# Patient Record
Sex: Female | Born: 1965 | Race: White | Hispanic: No | Smoking: Never smoker
Health system: Southern US, Community
[De-identification: ages and names within clinical notes are randomized; demographics above are authoritative.]

## PROBLEM LIST (undated history)

## (undated) DIAGNOSIS — C50919 Malignant neoplasm of unspecified site of unspecified female breast: Secondary | ICD-10-CM

## (undated) DIAGNOSIS — Z973 Presence of spectacles and contact lenses: Secondary | ICD-10-CM

## (undated) DIAGNOSIS — T4145XA Adverse effect of unspecified anesthetic, initial encounter: Secondary | ICD-10-CM

## (undated) DIAGNOSIS — F32A Depression, unspecified: Secondary | ICD-10-CM

## (undated) DIAGNOSIS — T7840XA Allergy, unspecified, initial encounter: Secondary | ICD-10-CM

## (undated) DIAGNOSIS — D649 Anemia, unspecified: Secondary | ICD-10-CM

## (undated) DIAGNOSIS — T8859XA Other complications of anesthesia, initial encounter: Secondary | ICD-10-CM

## (undated) DIAGNOSIS — K219 Gastro-esophageal reflux disease without esophagitis: Secondary | ICD-10-CM

## (undated) DIAGNOSIS — F329 Major depressive disorder, single episode, unspecified: Secondary | ICD-10-CM

## (undated) DIAGNOSIS — F419 Anxiety disorder, unspecified: Secondary | ICD-10-CM

## (undated) HISTORY — DX: Gastro-esophageal reflux disease without esophagitis: K21.9

## (undated) HISTORY — PX: CHOLECYSTECTOMY: SHX55

## (undated) HISTORY — DX: Depression, unspecified: F32.A

## (undated) HISTORY — DX: Malignant neoplasm of unspecified site of unspecified female breast: C50.919

## (undated) HISTORY — PX: APPENDECTOMY: SHX54

## (undated) HISTORY — DX: Anemia, unspecified: D64.9

---
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
## 1991-02-28 HISTORY — PX: DILATION AND CURETTAGE OF UTERUS: SHX78

## 2012-08-06 DIAGNOSIS — C50919 Malignant neoplasm of unspecified site of unspecified female breast: Secondary | ICD-10-CM

## 2012-08-06 HISTORY — DX: Malignant neoplasm of unspecified site of unspecified female breast: C50.919

## 2012-08-06 HISTORY — PX: BREAST BIOPSY: SHX20

## 2012-08-20 ENCOUNTER — Telehealth: Payer: Self-pay | Admitting: *Deleted

## 2012-08-20 NOTE — Telephone Encounter (Signed)
Confirmed 08/22/12 appt w/ pt.  Unable to mail before appt letter - gave verbal.  Unable to mail packet - put request for registration to give at time of arrival in appt notes.  Emailed pt address.  Angela Burke at referring to make aware.  Called Mandy at [facility] to verify that she had received slides and she did.  Going to send me report.  Took paperwork to [facility] for chart.

## 2012-08-21 ENCOUNTER — Other Ambulatory Visit: Payer: Self-pay | Admitting: Medical Oncology

## 2012-08-21 ENCOUNTER — Other Ambulatory Visit: Payer: Self-pay | Admitting: *Deleted

## 2012-08-21 DIAGNOSIS — C50912 Malignant neoplasm of unspecified site of left female breast: Secondary | ICD-10-CM

## 2012-08-21 DIAGNOSIS — C50911 Malignant neoplasm of unspecified site of right female breast: Secondary | ICD-10-CM

## 2012-08-21 DIAGNOSIS — C50919 Malignant neoplasm of unspecified site of unspecified female breast: Secondary | ICD-10-CM | POA: Insufficient documentation

## 2012-08-22 ENCOUNTER — Encounter: Payer: Self-pay | Admitting: Oncology

## 2012-08-22 ENCOUNTER — Telehealth: Payer: Self-pay | Admitting: Oncology

## 2012-08-22 ENCOUNTER — Ambulatory Visit: Payer: BC Managed Care – PPO

## 2012-08-22 ENCOUNTER — Other Ambulatory Visit (HOSPITAL_BASED_OUTPATIENT_CLINIC_OR_DEPARTMENT_OTHER): Payer: BC Managed Care – PPO | Admitting: Lab

## 2012-08-22 ENCOUNTER — Other Ambulatory Visit: Payer: Self-pay | Admitting: Medical Oncology

## 2012-08-22 ENCOUNTER — Ambulatory Visit (HOSPITAL_BASED_OUTPATIENT_CLINIC_OR_DEPARTMENT_OTHER): Payer: BC Managed Care – PPO | Admitting: Oncology

## 2012-08-22 VITALS — BP 128/81 | HR 73 | Temp 98.1°F | Resp 20 | Wt 183.7 lb

## 2012-08-22 DIAGNOSIS — C50919 Malignant neoplasm of unspecified site of unspecified female breast: Secondary | ICD-10-CM

## 2012-08-22 DIAGNOSIS — C50911 Malignant neoplasm of unspecified site of right female breast: Secondary | ICD-10-CM

## 2012-08-22 DIAGNOSIS — K219 Gastro-esophageal reflux disease without esophagitis: Secondary | ICD-10-CM

## 2012-08-22 DIAGNOSIS — C50912 Malignant neoplasm of unspecified site of left female breast: Secondary | ICD-10-CM

## 2012-08-22 HISTORY — DX: Gastro-esophageal reflux disease without esophagitis: K21.9

## 2012-08-22 LAB — CBC WITH DIFFERENTIAL/PLATELET
Basophils Absolute: 0 10*3/uL (ref 0.0–0.1)
HCT: 41.4 % (ref 34.8–46.6)
HGB: 13.8 g/dL (ref 11.6–15.9)
MCH: 29.7 pg (ref 25.1–34.0)
MONO#: 0.4 10*3/uL (ref 0.1–0.9)
NEUT%: 52.2 % (ref 38.4–76.8)
WBC: 5.5 10*3/uL (ref 3.9–10.3)
lymph#: 2 10*3/uL (ref 0.9–3.3)

## 2012-08-22 LAB — COMPREHENSIVE METABOLIC PANEL (CC13)
BUN: 12.5 mg/dL (ref 7.0–26.0)
CO2: 28 mEq/L (ref 22–29)
Calcium: 9.7 mg/dL (ref 8.4–10.4)
Chloride: 105 mEq/L (ref 98–109)
Creatinine: 0.8 mg/dL (ref 0.6–1.1)
Glucose: 88 mg/dl (ref 70–140)

## 2012-08-22 NOTE — Progress Notes (Signed)
Checked in new patient. No financial issues. The patient is really upset. No POA/living will. She wants communication via mail and phone only, she may give email later(really upset). I gave her the [facility] for to be filled out.

## 2012-08-22 NOTE — Patient Instructions (Addendum)
We discussed your diagnosis  We discussed treatment options  We referred you to surgery and radiation oncology  We ordered MRI breasts/CT and PET scan/echo  I will see you back in 2 weeks for follow up

## 2012-08-22 NOTE — Progress Notes (Signed)
Elizabeth Miles [id number] August 11, 1965 47 y.o. 08/22/2012 1:49 PM  CC  Dr. Evlyn Kanner Dr. Reuel Boom Pomposini Dr. Emelia Loron Dr. Lurline Hare  REASON FOR CONSULTATION:  47 year old female with new diagnosis of stage II right breast cancer   STAGE:  Right breast Stage II,T2NxMx ER+PR-Her2Neu- Ki-67 high    REFERRING PHYSICIAN: Dr. Norva Pavlov  HISTORY OF PRESENT ILLNESS:  Elizabeth Miles is a 47 y.o. female.  Without significant past medical history who on June 2013 had a mammogram that was normal. But there was on physical exam possibility of a cyst noted in the right breast. The mammogram was negative. In 2014 June patient noted on exam another lump in the right breast. She underwent a diagnostic mammogram on June 10 that showed a right breast nodule in the outer quadrant. She had an ultrasound performed that showed at the 9:30 o'clock position a 3.6 cm area and then added 10:00 position 1.3 cm area with a total area being anywhere between 5-6 cm. The patient went on to have a right breast biopsy performed in [place]. The pathology revealed an invasive ductal carcinoma. This is been confirmed by our pathology as well. The carcinoma and papillary features and was felt to be between a grade 1 and 2. The tumor was estrogen receptor positive strongly (100%) progesterone receptor negative HER-2/neu negative with a Ki-67 that showed a high proliferation rate. Patient is now seen in medical oncology for discussion of treatment options. She has not had a MRI performed or a consultation with surgery or radiation oncology. Patient is very anxious nervous she today is accompanied by her husband as well as her mother and father and her son. She does have tenderness in the right breast. She denies any nausea vomiting fevers chills night sweats headaches shortness of breath chest pains palpitations. She has no myalgias and arthralgias. Remainder of the 14 point review of systems is  negative.   Past Medical History: Past Medical History  Diagnosis Date  . 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 GERD (gastroesophageal reflux disease)   . GERD (gastroesophageal reflux disease) 08/22/2012    Past Surgical History: Past Surgical History  Procedure Laterality Date  . Dilation and curettage of uterus  1993  . Cervical ablation  1983    Family History: Family History  Problem Relation Age of Onset  . Hypertension Mother   . Cancer Maternal Uncle 73    bladder cancer  . Cancer Paternal Grandmother 10    lung cancer  . Cancer Paternal Grandfather 60    lower back cancer    Social History History  Substance Use Topics  . Smoking status: Never Smoker   . Smokeless tobacco: Never Used  . Alcohol Use: No    Allergies: Allergies  Allergen Reactions  . Darvocet (Propoxyphene-Acetaminophen) Shortness Of Breath and Swelling  . Shellfish Allergy Shortness Of Breath and Swelling    Current Medications: Current Outpatient Prescriptions  Medication Sig Dispense Refill  . acetaminophen (TYLENOL) 325 MG tablet Take 650 mg by mouth every 4 (four) hours as needed for pain.      . cetirizine (ZYRTEC) 10 MG tablet Take 10 mg by mouth daily.      Marland Kitchen ibuprofen (ADVIL,MOTRIN) 200 MG tablet Take 200 mg by mouth every 4 (four) hours as needed for pain.      . Lansoprazole (PREVACID PO) Take by mouth daily.       No current facility-administered medications for this visit.  OB/GYN History: menarche at 1, pre-menopause, G4P3 first live birth at 64  Fertility Discussion: N/A Prior History of Cancer: no   Health Maintenance:  Colonoscopy none Bone Density none Last PAP smear 2014  ECOG PERFORMANCE STATUS: 0 - Asymptomatic  Genetic Counseling/testing: patient will be referred to genetic counseling  REVIEW OF SYSTEMS:  A comprehensive review of systems was negative.  PHYSICAL EXAMINATION: Blood pressure 128/81, pulse 73, temperature 98.1 F  (36.7 C), temperature source Oral, resp. rate 20, weight 183 lb 11.2 oz (83.326 kg). Patient is a well-developed well-nourished female she is anxious but otherwise in no acute distress. HEENT exam EOMI PERRLA sclerae anicteric no conjunctival pallor oral mucosa is moist neck is supple lungs clear bilaterally to auscultation cardiovascular regular rate rhythm abdomen is soft nontender nondistended bowel sounds are present no HSM extremities no clubbing edema or cyanosis neuro patient's alert oriented otherwise nonfocal. Left breast no masses nipple discharge no skin changes Right breast does reveal a palpable mass in the 10:00 to 9:00 position measuring about 4 cm.     STUDIES/RESULTS: No results found.   LABS:    Chemistry      Component Value Date/Time   NA 140 08/22/2012 1257   K 4.3 08/22/2012 1257   CO2 28 08/22/2012 1257   BUN 12.5 08/22/2012 1257   CREATININE 0.8 08/22/2012 1257      Component Value Date/Time   CALCIUM 9.7 08/22/2012 1257   ALKPHOS 51 08/22/2012 1257   AST 11 08/22/2012 1257   ALT 10 08/22/2012 1257   BILITOT 0.45 08/22/2012 1257      Lab Results  Component Value Date   WBC 5.5 08/22/2012   HGB 13.8 08/22/2012   HCT 41.4 08/22/2012   MCV 89.2 08/22/2012   PLT 280 08/22/2012   PATHOLOGY:  ASSESSMENT    47 year old female with  #1 new diagnosis of invasive ductal carcinoma of the right breast made on a recent biopsy. The tumor has papillary features it is felt to be between a grade 1 and 2. The tumor is estrogen receptor strongly positive nearly 100% and progesterone receptor negative. HER-2/neu-negative with elevated Ki-67.  #2 patient and I discussed her history her radiology as well as the pathology in detail. She has not had MRI of the breasts performed and I do think that this is important as she does require breast conservation. We also discussed staging studies which would include PET scan and CT scans.  #3 we also discussed you to her early onset of  breast cancer that she would be a candidate for genetic testing. We discussed the rationale for that we discussed BRCA1 and BRCA2 gene mutations. I will refer her to our genetic counselor.  #4 we discussed her treatment options. Since patient does desire breast conservation I do think she would be a good candidate for neoadjuvant/preop chemotherapy. We discussed the type of chemotherapy which would include Adriamycin Cytoxan to be given every 2 weeks for a total of 4 cycles with Neulasta support. After this she will proceed with Taxol every week for a total of 12 weeks. After completion of chemotherapy we would repeat an MRI of her breasts. To evaluate response to therapy. After that she would proceed with surgery.  #5 patient does need to be seen by a surgical oncologist and I have recommended that she be seen by Dr. Emelia Loron and I will set her up for an appointment.  #6 the patient successfully undergoes breast conservation then she certainly will  need radiation therapy and I will refer her to Dr. Lurline Hare for that.  Clinical Trial Eligibility: no Multidisciplinary conference discussion no     PLAN:    #1 patient will be referred for MRI of the breasts.  #2 Port-A-Cath placement by Dr. Emelia Loron and initial surgical consultation  #3 patient will need echocardiogram and chemotherapy class.  #4 for staging purposes I would get her set up for a PET/CT.  #5 patient's early onset breast cancer I would refer her to genetic counseling   #6 plan to get her started on chemotherapy in the next few weeks time.     Discussion: Patient is being treated per NCCN breast cancer care guidelines appropriate for stage.II   Thank you so much for allowing me to participate in the care of [employer]. I will continue to follow up the patient with you and assist in her care.  All questions were answered. The patient knows to call the clinic with any problems, questions or  concerns. We can certainly see the patient much sooner if necessary.  I spent 55 minutes counseling the patient face to face. The total time spent in the appointment was 60 minutes.  Drue Second, MD Medical/Oncology [facility] [phone] (beeper) [phone] (Office)  08/22/2012, 1:49 PM

## 2012-08-23 ENCOUNTER — Telehealth: Payer: Self-pay | Admitting: Oncology

## 2012-08-23 NOTE — Telephone Encounter (Signed)
, °

## 2012-08-26 ENCOUNTER — Other Ambulatory Visit: Payer: BC Managed Care – PPO

## 2012-08-27 ENCOUNTER — Other Ambulatory Visit: Payer: BC Managed Care – PPO

## 2012-08-27 ENCOUNTER — Ambulatory Visit
Admission: RE | Admit: 2012-08-27 | Discharge: 2012-08-27 | Disposition: A | Discharge status: Home / Self Care | Payer: BC Managed Care – PPO | Source: Ambulatory Visit | Attending: Oncology | Admitting: Oncology

## 2012-08-27 ENCOUNTER — Encounter: Payer: Self-pay | Admitting: *Deleted

## 2012-08-27 ENCOUNTER — Ambulatory Visit (HOSPITAL_COMMUNITY)
Admission: RE | Admit: 2012-08-27 | Discharge: 2012-08-27 | Disposition: A | Discharge status: Home / Self Care | Payer: BC Managed Care – PPO | Source: Ambulatory Visit | Attending: Oncology | Admitting: Oncology

## 2012-08-27 DIAGNOSIS — I519 Heart disease, unspecified: Secondary | ICD-10-CM

## 2012-08-27 DIAGNOSIS — C50919 Malignant neoplasm of unspecified site of unspecified female breast: Secondary | ICD-10-CM | POA: Insufficient documentation

## 2012-08-27 DIAGNOSIS — K219 Gastro-esophageal reflux disease without esophagitis: Secondary | ICD-10-CM

## 2012-08-27 DIAGNOSIS — I059 Rheumatic mitral valve disease, unspecified: Secondary | ICD-10-CM | POA: Insufficient documentation

## 2012-08-27 MED ORDER — GADOBENATE DIMEGLUMINE 529 MG/ML IV SOLN
16.0000 mL | Freq: Once | INTRAVENOUS | Status: AC | PRN
  Administered 2012-08-27: 16 mL via INTRAVENOUS

## 2012-08-27 NOTE — Progress Notes (Signed)
*  PRELIMINARY RESULTS* Echocardiogram 2D Echocardiogram has been performed.  Elizabeth Miles 08/27/2012, 2:49 PM

## 2012-08-28 ENCOUNTER — Encounter: Payer: Self-pay | Admitting: Radiation Oncology

## 2012-08-28 NOTE — Progress Notes (Addendum)
Location of Breast Cancer:right   Histology per Pathology Report: 08/06/12: Right Breast U/S biopsies; infiltrating ductal  Carcinoma  Receptor Status: ER(+), PR (-), Her2-neu (-)  Did patient present with symptoms (if so, please note symptoms) or was this found on screening mammography?: last year  Patient noticed a lump,mammogram showed nothing, this time lump still there, had U/S, mammogram and biopsy   Past/Anticipated interventions by surgeon, if ZOX:WRUE with Dr.Wakefield 09/10/12,evaluate right breast  Past/Anticipated interventions by medical oncology, if any: Chemotherapy  Seen in [facility] Dr.Khan, referred for genetic testing, appt 09/03/12 with Dr.Khan  Pet scan and Ct scan 08/29/12 scheduled , had MRI  At [facility] imaging  Tuesday 08/27/12,  Lymphedema issues, if any: no Pain issues, if any:  None in breast, lower back pain years chronic  SAFETY ISSUES:  Prior radiation? no  Pacemaker/ICD? no  Possible current pregnancy?no  Is the patient on methotrexate? no  Current Complaints / other details: Married, Menarche age 31,pre-menopauase,G4P3, 1 miscarriage, 1st live birth age 71, Cervical ablation, 1983, ,Dilation and curettage of uterus 6  Mother htn,living,  father  Healthy, Maternal Uncle 73,bladder cancer,deceased, Paternal Grandfather  63,lung cancer, deceased, Paternal Grandmother  65,lower back cancer deceased   Never smoker,   Allergies: Darvocet/shellfish=SOB and swelling  Lowella Petties, RN 08/28/2012,3:00 PM

## 2012-08-29 ENCOUNTER — Encounter (HOSPITAL_COMMUNITY)
Admission: RE | Admit: 2012-08-29 | Discharge: 2012-08-29 | Disposition: A | Discharge status: Home / Self Care | Payer: BC Managed Care – PPO | Source: Ambulatory Visit | Attending: Oncology | Admitting: Oncology

## 2012-08-29 ENCOUNTER — Ambulatory Visit
Admission: RE | Admit: 2012-08-29 | Discharge: 2012-08-29 | Disposition: A | Discharge status: Home / Self Care | Payer: BC Managed Care – PPO | Source: Ambulatory Visit | Attending: Radiation Oncology | Admitting: Radiation Oncology

## 2012-08-29 ENCOUNTER — Encounter (HOSPITAL_COMMUNITY): Payer: Self-pay

## 2012-08-29 ENCOUNTER — Ambulatory Visit (HOSPITAL_COMMUNITY)
Admission: RE | Admit: 2012-08-29 | Discharge: 2012-08-29 | Disposition: A | Discharge status: Home / Self Care | Payer: BC Managed Care – PPO | Source: Ambulatory Visit | Attending: Oncology | Admitting: Oncology

## 2012-08-29 ENCOUNTER — Other Ambulatory Visit: Payer: Self-pay | Admitting: Emergency Medicine

## 2012-08-29 ENCOUNTER — Encounter: Payer: Self-pay | Admitting: Radiation Oncology

## 2012-08-29 VITALS — BP 109/73 | HR 64 | Temp 98.1°F | Resp 20 | Ht 67.0 in | Wt 185.4 lb

## 2012-08-29 DIAGNOSIS — Z8052 Family history of malignant neoplasm of bladder: Secondary | ICD-10-CM | POA: Insufficient documentation

## 2012-08-29 DIAGNOSIS — Z79899 Other long term (current) drug therapy: Secondary | ICD-10-CM | POA: Insufficient documentation

## 2012-08-29 DIAGNOSIS — Z801 Family history of malignant neoplasm of trachea, bronchus and lung: Secondary | ICD-10-CM | POA: Insufficient documentation

## 2012-08-29 DIAGNOSIS — K219 Gastro-esophageal reflux disease without esophagitis: Secondary | ICD-10-CM

## 2012-08-29 DIAGNOSIS — C50911 Malignant neoplasm of unspecified site of right female breast: Secondary | ICD-10-CM

## 2012-08-29 DIAGNOSIS — C50919 Malignant neoplasm of unspecified site of unspecified female breast: Secondary | ICD-10-CM | POA: Insufficient documentation

## 2012-08-29 DIAGNOSIS — Z170 Estrogen receptor positive status [ER+]: Secondary | ICD-10-CM | POA: Insufficient documentation

## 2012-08-29 DIAGNOSIS — N649 Disorder of breast, unspecified: Secondary | ICD-10-CM | POA: Insufficient documentation

## 2012-08-29 HISTORY — DX: Allergy, unspecified, initial encounter: T78.40XA

## 2012-08-29 LAB — GLUCOSE, CAPILLARY: Glucose-Capillary: 88 mg/dL (ref 70–99)

## 2012-08-29 MED ORDER — FLUDEOXYGLUCOSE F - 18 (FDG) INJECTION
17.6000 | Freq: Once | INTRAVENOUS | Status: AC | PRN
  Administered 2012-08-29: 17.6 via INTRAVENOUS

## 2012-08-29 MED ORDER — RADIAPLEXRX EX GEL
Freq: Once | CUTANEOUS | Status: AC
  Administered 2012-08-29: 11:00:00 via TOPICAL

## 2012-08-29 MED ORDER — IOHEXOL 300 MG/ML  SOLN
100.0000 mL | Freq: Once | INTRAMUSCULAR | Status: AC | PRN
  Administered 2012-08-29: 100 mL via INTRAVENOUS

## 2012-08-29 NOTE — Progress Notes (Addendum)
Radiation Oncology         [phone]) [phone] ________________________________  Initial outpatient Consultation - Date: 08/29/2012   Name: Elizabeth Miles MRN: [id number]   DOB: April 12, 1965  REFERRING PHYSICIAN: Victorino December, MD  DIAGNOSIS: T2N0 Right Breast Cancer  HISTORY OF PRESENT ILLNESS::Elizabeth Miles is a 47 y.o. female  who palpated a breast mass last year. She had a mammogram performed at that time. Which showed no evidence of the mass. Some asymmetry was noted in the left breast but followup views were negative. When she palpated the mass again this year and it did not resolve she was sent for mammograms which showed a large nodule in the upper outer quadrant of the right breast adjacent to the nipple. An ultrasound was performed which showed a 3.6 x 2.8 x 3.1 cm mass with an additional nodule measuring 1.1 x 1.1 x 1.3 cm. A biopsy was performed on 08/06/2012 which showed an ER positive PR negative HER-2 negative breast cancer with a increased proliferation rate although the Ki-67 was not quantified. She was referred here and met with Dr. Welton Flakes who recommended neoadjuvant chemotherapy. A PET scan has been ordered. An MRI of the bilateral breasts was performed on July 1 which showed a irregular lobulated mass with a satellite nodule or lobulation at its superior aspect measured together as 4.5 x 4 x 3.8 cm. Extension of enhancement to the nipple suggested nipple involvement. She is scheduled to meet with surgery in a few weeks. She is accompanied by her parents today. She is a friend of one of my patients Elizabeth Miles. She would like to receive her radiation at [facility] if at all possible. She has no family history of breast ovarian or colon cancer. Due to her young age she has been referred to genetics counseling. That appointment is in August. She is interested in breast conservation. She is G4 P3 with her first live child birth at the age of 23.  PREVIOUS RADIATION THERAPY: No  PAST MEDICAL  HISTORY:  has a past medical history of Breast cancer (08/06/12); GERD (gastroesophageal reflux disease); GERD (gastroesophageal reflux disease) (08/22/2012); and Allergy.    PAST SURGICAL HISTORY: Past Surgical History  Procedure Laterality Date  . Dilation and curettage of uterus  1993  . Cervical ablation  1983  . Breast biopsy Right 08/06/12    FAMILY HISTORY:  Family History  Problem Relation Age of Onset  . Hypertension Mother   . Cancer Maternal Uncle 73    bladder cancer  . Cancer Paternal Grandmother 6    lung cancer  . Cancer Paternal Grandfather 12    lower back cancer    SOCIAL HISTORY:  History  Substance Use Topics  . Smoking status: Never Smoker   . Smokeless tobacco: Never Used  . Alcohol Use: No    ALLERGIES: Darvocet and Shellfish allergy  MEDICATIONS:  Current Outpatient Prescriptions  Medication Sig Dispense Refill  . hyaluronate sodium (RADIAPLEXRX) GEL Apply 1 application topically once. Apply after rad txs and bedtime,prn      . Lansoprazole (PREVACID PO) Take by mouth daily.      Marland Kitchen acetaminophen (TYLENOL) 325 MG tablet Take 650 mg by mouth every 4 (four) hours as needed for pain.      . cetirizine (ZYRTEC) 10 MG tablet Take 10 mg by mouth daily.      Marland Kitchen ibuprofen (ADVIL,MOTRIN) 200 MG tablet Take 200 mg by mouth every 4 (four) hours as needed for pain.  No current facility-administered medications for this encounter.    REVIEW OF SYSTEMS:  A 15 point review of systems is documented in the electronic medical record. This was obtained by the nursing staff. However, I reviewed this with the patient to discuss relevant findings and make appropriate changes.  Pertinent items are noted in HPI.   PHYSICAL EXAM:  Filed Vitals:   08/29/12 0927  BP: 109/73  Pulse: 64  Temp: 98.1 F (36.7 C)  Resp: 20  .185 lb 6.4 oz (84.097 kg). She is a pleasant female in no distress sitting comfortably examining table. She has large pendulous breasts  bilaterally. She has a palpable mass extending from the nipple to the upper outer quadrant of the right breast. This is mobile however is palpable right below the skin. There is some bruising over the entire outer quadrant of the right breast.  There are no palpable abnormalities of the left breast. There are no palpable axillary supraclavicular or cervical lymph nodes. She is alert and oriented x3.  LABORATORY DATA:  Lab Results  Component Value Date   WBC 5.5 08/22/2012   HGB 13.8 08/22/2012   HCT 41.4 08/22/2012   MCV 89.2 08/22/2012   PLT 280 08/22/2012   Lab Results  Component Value Date   NA 140 08/22/2012   K 4.3 08/22/2012   CO2 28 08/22/2012   Lab Results  Component Value Date   ALT 10 08/22/2012   AST 11 08/22/2012   ALKPHOS 51 08/22/2012   BILITOT 0.45 08/22/2012   Mr Breast Bilateral W Wo Contrast  08/27/2012   *RADIOLOGY REPORT*  Clinical Data: Newly-diagnosed right breast invasive ductal carcinoma manifesting as a palpable mass.  BILATERAL BREAST MRI WITH AND WITHOUT CONTRAST  Technique: Multiplanar, multisequence MR images of both breasts were obtained prior to and following the intravenous administration of 16ml of Multihance.  Three dimensional images were evaluated at the independent DynaCad workstation.  Comparison:  [place] mammograms 2014 and 2012.  We are not provided with post biopsy mammograms to indicate whether a clip was placed.  Findings: Background parenchymal enhancement pattern is mild. Breast parenchymal pattern suggests heterogeneously dense parenchyma.  In the right breast upper outer quadrant is an irregular lobulated mass with central T2 hyperintensity suggesting necrosis or biopsy artifact, with a satellite nodule or lobulation within 2 mm at its superior aspect, measured together as 4.5 x 4.0 x 3.8 cm.  This demonstrates washin/washout type enhancement kinetics.  This corresponds to the reported biopsy-proven breast cancer.  Internal signal voids are noted which may  indicate gas, clip artifact, or less likely calcification.  There is extension of enhancement to the nipple, suggesting nipple involvement may be present.  No other area of abnormal enhancement is seen in either breast.  No lymphadenopathy or T2-weighted hyperintensity elsewhere in either breast.  IMPRESSION: Dominant right breast upper outer quadrant irregular mass corresponding to the biopsy-proven breast cancer, measuring 4.5 cm in total and including the satellite nodule or lobulation at its superior aspect, which is within 2 mm of the dominant mass.  It is not clear from the provided images whether a clip was placed at prior biopsy.  If the patient is to undergo neoadjuvant chemotherapy, placement of a clip is highly recommended, because the mass could become occult on imaging if there is a favorable treatment response, and therefore may not be accurately localized if lumpectomy is planned.  Extension of enhancement from the mass to the right nipple may suggest nipple involvement.  No  evidence for contralateral left-sided abnormal enhancement.  RECOMMENDATION: Treatment plan  THREE-DIMENSIONAL MR IMAGE RENDERING ON INDEPENDENT WORKSTATION:  Three-dimensional MR images were rendered by post-processing of the original MR data on an independent workstation.  The three- dimensional MR images were interpreted, and findings were reported in the accompanying complete MRI report for this study.  BI-RADS CATEGORY 6:  Known biopsy-proven malignancy - appropriate action should be taken.   Original Report Authenticated By: Christiana Pellant, M.D.      IMPRESSION: T2 N0 ER positive PR negative HER-2 negative invasive ductal carcinoma of the right breast  PLAN: I spoke with Mrs. Kobashigawa and her parents today regarding her diagnosis and options for treatment. We discussed the role of neoadjuvant chemotherapy in increasing breast conservation rate in patients who have a large tumor compared to the breast size. She has fairly  generous breast and hopefully with neoadjuvant chemotherapy will be able to undergo breast conservation. We discussed the equivalency between neoadjuvant and adjuvant chemotherapy. We discussed that her MRI did not show any enlarged lymph nodes and her mass did not exceed 5 cm at this point she had no indications for postmastectomy radiation if she chose a mastectomy. We discussed the equivalency in terms of survival between mastectomy and lumpectomy plus radiation. We discussed the role of radiation in decreasing local failures even in patients who have a pathologic complete response to neoadjuvant chemotherapy. We discussed the process of simulation the placement tattoos. We discussed possible side effects including fatigue and skin redness. She would like to receive her treatment closer to [place] in [place]. This can be arranged after her surgery and she can be referred to Dr. Thersa Salt there. I did give her a tube of Radiaplex gel and instructions on its use during radiation. I told her I would be happy to meet back with her after surgery to discuss radiation again or she could be referred directly to Dr. Thersa Salt. Her questions were answered. I gave her information regarding simulation. She was encouraged to contact me with any questions. I spent 60 minutes  face to face with the patient and more than 50% of that time was spent in counseling and/or coordination of care.   ------------------------------------------------  Lurline Hare, MD

## 2012-08-29 NOTE — Progress Notes (Signed)
Please see the Nurse Progress Note in the MD Initial Consult Encounter for this patient. 

## 2012-09-03 ENCOUNTER — Other Ambulatory Visit (INDEPENDENT_AMBULATORY_CARE_PROVIDER_SITE_OTHER): Payer: Self-pay | Admitting: General Surgery

## 2012-09-03 ENCOUNTER — Ambulatory Visit (HOSPITAL_BASED_OUTPATIENT_CLINIC_OR_DEPARTMENT_OTHER): Payer: BC Managed Care – PPO | Admitting: Oncology

## 2012-09-03 ENCOUNTER — Telehealth: Payer: Self-pay | Admitting: *Deleted

## 2012-09-03 ENCOUNTER — Telehealth (INDEPENDENT_AMBULATORY_CARE_PROVIDER_SITE_OTHER): Payer: Self-pay

## 2012-09-03 VITALS — BP 124/81 | HR 76 | Temp 98.1°F | Resp 20 | Ht 67.0 in | Wt 187.2 lb

## 2012-09-03 DIAGNOSIS — C50919 Malignant neoplasm of unspecified site of unspecified female breast: Secondary | ICD-10-CM | POA: Insufficient documentation

## 2012-09-03 DIAGNOSIS — C50911 Malignant neoplasm of unspecified site of right female breast: Secondary | ICD-10-CM

## 2012-09-03 MED ORDER — LORAZEPAM 0.5 MG PO TABS: 0.5000 mg | ORAL_TABLET | Freq: Four times a day (QID) | ORAL | Status: DC | PRN

## 2012-09-03 MED ORDER — DEXLANSOPRAZOLE 30 MG PO CPDR: 30.0000 mg | DELAYED_RELEASE_CAPSULE | Freq: Every day | ORAL | Status: DC

## 2012-09-03 MED ORDER — PROCHLORPERAZINE MALEATE 10 MG PO TABS: 10.0000 mg | ORAL_TABLET | Freq: Four times a day (QID) | ORAL | Status: DC | PRN

## 2012-09-03 MED ORDER — LIDOCAINE-PRILOCAINE 2.5-2.5 % EX CREA: TOPICAL_CREAM | CUTANEOUS | Status: DC | PRN

## 2012-09-03 MED ORDER — ONDANSETRON HCL 8 MG PO TABS: 8.0000 mg | ORAL_TABLET | Freq: Two times a day (BID) | ORAL | Status: DC | PRN

## 2012-09-03 MED ORDER — DEXAMETHASONE 4 MG PO TABS: ORAL_TABLET | ORAL | Status: DC

## 2012-09-03 NOTE — Telephone Encounter (Signed)
Per staff message and POF I have scheduled appts.  JMW  

## 2012-09-03 NOTE — Telephone Encounter (Signed)
Called Elizabeth Miles to notify her that we are going to work on getting her scheduled for the [facility] placement next week even though she has not had her office visit with Dr Dwain Sarna. The Elizabeth Miles is scheduled to see Dr Dwain Sarna on 7/15 for a new breast cancer evaluation so we are going to try and get her port scheduled for next Thursday 7/17 per Dr Doreen Salvage request. I have our surgery schedulers working on getting scheduled now and they will call the Elizabeth Miles with the info. The Elizabeth Miles is scheduled to get chemo by Dr Welton Flakes on 09/13/12. The Elizabeth Miles understands and will see [place] on 09/10/12.

## 2012-09-03 NOTE — Patient Instructions (Addendum)
#  1 you have an appointment scheduled with Dr. Dwain Sarna for her to stay July 15. He will plan on getting you schedule for a Port-A-Cath placement the following day or 2.  #2 we will plan on starting you on chemotherapy on 09/13/2012.  #3 we discussed findings of your CT scans as well as PET scan.  #4 prescriptions for antinausea medications were sent to your pharmacy.

## 2012-09-03 NOTE — Telephone Encounter (Signed)
appts made and printed. i emailed MW to add the tx. i made the pt aware that the tx will be added...td

## 2012-09-04 ENCOUNTER — Ambulatory Visit: Payer: BC Managed Care – PPO | Admitting: Oncology

## 2012-09-04 ENCOUNTER — Encounter (HOSPITAL_COMMUNITY): Payer: Self-pay | Admitting: Respiratory Therapy

## 2012-09-04 ENCOUNTER — Other Ambulatory Visit: Payer: Self-pay | Admitting: Certified Registered Nurse Anesthetist

## 2012-09-07 ENCOUNTER — Encounter: Payer: Self-pay | Admitting: *Deleted

## 2012-09-07 NOTE — Progress Notes (Signed)
Mailed after appt letter to pt. 

## 2012-09-10 ENCOUNTER — Ambulatory Visit (INDEPENDENT_AMBULATORY_CARE_PROVIDER_SITE_OTHER): Payer: BC Managed Care – PPO | Admitting: General Surgery

## 2012-09-10 ENCOUNTER — Encounter (HOSPITAL_COMMUNITY)
Admission: RE | Admit: 2012-09-10 | Discharge: 2012-09-10 | Disposition: A | Discharge status: Home / Self Care | Payer: BC Managed Care – PPO | Source: Ambulatory Visit | Attending: General Surgery | Admitting: General Surgery

## 2012-09-10 ENCOUNTER — Encounter (HOSPITAL_COMMUNITY): Payer: Self-pay

## 2012-09-10 ENCOUNTER — Encounter (INDEPENDENT_AMBULATORY_CARE_PROVIDER_SITE_OTHER): Payer: Self-pay | Admitting: General Surgery

## 2012-09-10 VITALS — BP 110/78 | HR 82 | Resp 14 | Ht 67.0 in | Wt 189.0 lb

## 2012-09-10 DIAGNOSIS — C50919 Malignant neoplasm of unspecified site of unspecified female breast: Secondary | ICD-10-CM

## 2012-09-10 HISTORY — DX: Anxiety disorder, unspecified: F41.9

## 2012-09-10 HISTORY — DX: Adverse effect of unspecified anesthetic, initial encounter: T41.45XA

## 2012-09-10 HISTORY — DX: Other complications of anesthesia, initial encounter: T88.59XA

## 2012-09-10 LAB — CBC
MCH: 29.5 pg (ref 26.0–34.0)
MCV: 89.7 fL (ref 78.0–100.0)
Platelets: 264 10*3/uL (ref 150–400)
RBC: 4.65 MIL/uL (ref 3.87–5.11)
RDW: 12.9 % (ref 11.5–15.5)
WBC: 4.8 10*3/uL (ref 4.0–10.5)

## 2012-09-10 NOTE — Pre-Procedure Instructions (Signed)
Elizabeth Miles  09/10/2012   Your procedure is scheduled on:  Thursday, July 17  Report to [facility] at 0530 AM.  Call this number if you have problems the morning of surgery: [phone]   Remember:   Do not eat food or drink liquids after midnight.Wednesday night   Take these medicines the morning of surgery with A SIP OF WATER: none   Do not wear jewelry, make-up or nail polish.  Do not wear lotions, powders, or perfumes.Do not wear deodorant.  Do not shave 48 hours prior to surgery. Men may shave face and neck.  Do not bring valuables to the hospital.  [facility] is not responsible   for any belongings or valuables.  Contacts, dentures or bridgework may not be worn into surgery.      Patients discharged the day of surgery will not be allowed to drive  home.  Name and phone number of your driver:    Special Instructions: Shower using CHG 2 nights before surgery and the night before surgery.  If you shower the day of surgery use CHG.  Use special wash - you have one bottle of CHG for all showers.  You should use approximately 1/3 of the bottle for each shower.   Please read over the following fact sheets that you were given: Pain Booklet, Coughing and Deep Breathing and Surgical Site Infection Prevention

## 2012-09-10 NOTE — Progress Notes (Signed)
Patient ID: Elizabeth Miles, female   DOB: Jul 06, 1965, 47 y.o.   MRN: [id number]  Chief Complaint  Patient presents with  . New Evaluation    new br ca rt    HPI Elizabeth Miles is a 47 y.o. female.  Referred by Dr Drue Second HPI 89 yof who has had history of palpable right breast mass which was noted on her mm and exam this year. She underwent a mm which showed large right breast central/uoq mass. An u/s that shows a 3.6x2.8x3.1 cm mass with additional satellite that is immediately adjacent to this measuring 1.1x1.1x1.3 cm in size. This was all completed in [place]. 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 MR has now been done which shows an irregular lobulated mass measuring in total 4.5x4x3.8 cm in size. She has been seen by med onc and rad onc and they have discussed primary chemotherapy. She comes to see me today to discuss eventual surgical therapy and port placement.   Past Medical History  Diagnosis Date  . 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 GERD (gastroesophageal reflux disease)   . GERD (gastroesophageal reflux disease) 08/22/2012  . Allergy   . Complication of anesthesia     1986 ; problem waking up  . Anxiety     Past Surgical History  Procedure Laterality Date  . Dilation and curettage of uterus  1993  . Cervical ablation  1983  . Breast biopsy Right 08/06/12  . Popliteal synovial cyst excision  1970    Family History  Problem Relation Age of Onset  . Hypertension Mother   . Cancer Maternal Uncle 73    bladder cancer  . Cancer Paternal Grandmother 67    lung cancer  . Cancer Paternal Grandfather 29    lower back cancer    Social History History  Substance Use Topics  . Smoking status: Never Smoker   . Smokeless tobacco: Never Used  . Alcohol Use: No    Allergies  Allergen Reactions  . Darvocet (Propoxyphene-Acetaminophen) Shortness Of Breath and Swelling  .  Shellfish Allergy Shortness Of Breath and Swelling    Current Outpatient Prescriptions  Medication Sig Dispense Refill  . acetaminophen (TYLENOL) 325 MG tablet Take 650 mg by mouth every 4 (four) hours as needed for pain.      . cetirizine (ZYRTEC) 10 MG tablet Take 10 mg by mouth daily.      Marland Kitchen Dexlansoprazole (DEXILANT) 30 MG capsule Take 1 capsule (30 mg total) by mouth daily.  30 capsule  7  . ibuprofen (ADVIL,MOTRIN) 200 MG tablet Take 200 mg by mouth every 4 (four) hours as needed for pain.      Marland Kitchen lidocaine-prilocaine (EMLA) cream Apply topically as needed.  30 g  7  . polyethylene glycol (MIRALAX / GLYCOLAX) packet Take 17 g by mouth as needed.      Marland Kitchen dexamethasone (DECADRON) 4 MG tablet Take 2 tablets by mouth once a day on the day after chemotherapy and then take 2 tablets two times a day for 2 days. Take with food.  30 tablet  1  . hyaluronate sodium (RADIAPLEXRX) GEL Apply 1 application topically once. Apply after rad txs and bedtime,prn      . LORazepam (ATIVAN) 0.5 MG tablet Take 1 tablet (0.5 mg total) by mouth every 6 (six) hours as needed (Nausea or vomiting).  30 tablet  0  . ondansetron (ZOFRAN)  8 MG tablet Take 1 tablet (8 mg total) by mouth 2 (two) times daily as needed. Take two times a day as needed for nausea or vomiting starting on the third day after chemotherapy.  30 tablet  1  . prochlorperazine (COMPAZINE) 10 MG tablet Take 1 tablet (10 mg total) by mouth every 6 (six) hours as needed (Nausea or vomiting).  30 tablet  1   No current facility-administered medications for this visit.    Review of Systems Review of Systems  Constitutional: Negative for fever, chills and unexpected weight change.  HENT: Negative for hearing loss, congestion, sore throat, trouble swallowing and voice change.   Eyes: Negative for visual disturbance.  Respiratory: Negative for cough and wheezing.   Cardiovascular: Negative for chest pain, palpitations and leg swelling.    Gastrointestinal: Negative for nausea, vomiting, abdominal pain, diarrhea, constipation, blood in stool, abdominal distention and anal bleeding.  Genitourinary: Negative for hematuria, vaginal bleeding and difficulty urinating.  Musculoskeletal: Negative for arthralgias.  Skin: Negative for rash and wound.  Neurological: Negative for seizures, syncope and headaches.  Hematological: Negative for adenopathy. Does not bruise/bleed easily.  Psychiatric/Behavioral: Negative for confusion.    Blood pressure 110/78, pulse 82, resp. rate 14, height 5\' 7"  (1.702 m), weight 189 lb (85.73 kg), last menstrual period 03/12/2012.  Physical Exam Physical Exam  Vitals reviewed. Constitutional: She appears well-developed and well-nourished.  HENT:  Head: Normocephalic and atraumatic.  Cardiovascular: Normal rate, regular rhythm and normal heart sounds.   Pulmonary/Chest: Effort normal and breath sounds normal. She has no wheezes. Right breast exhibits mass. Right breast exhibits no inverted nipple, no nipple discharge, no skin change and no tenderness. Left breast exhibits no inverted nipple, no mass, no nipple discharge, no skin change and no tenderness. Breasts are symmetrical.    Lymphadenopathy:    She has no cervical adenopathy.    Data Reviewed BILATERAL BREAST MRI WITH AND WITHOUT CONTRAST  Technique: Multiplanar, multisequence MR images of both breasts  were obtained prior to and following the intravenous administration  of 16ml of Multihance. Three dimensional images were evaluated at  the independent DynaCad workstation.  Comparison: [place] mammograms 2014 and 2012. We are not  provided with post biopsy mammograms to indicate whether a clip was  placed.  Findings: Background parenchymal enhancement pattern is mild.  Breast parenchymal pattern suggests heterogeneously dense  parenchyma. In the right breast upper outer quadrant is an  irregular lobulated mass with central T2  hyperintensity suggesting  necrosis or biopsy artifact, with a satellite nodule or lobulation  within 2 mm at its superior aspect, measured together as 4.5 x 4.0  x 3.8 cm. This demonstrates washin/washout type enhancement  kinetics. This corresponds to the reported biopsy-proven breast  cancer. Internal signal voids are noted which may indicate gas,  clip artifact, or less likely calcification. There is extension of  enhancement to the nipple, suggesting nipple involvement may be  present. No other area of abnormal enhancement is seen in either  breast. No lymphadenopathy or T2-weighted hyperintensity elsewhere  in either breast.  IMPRESSION:  Dominant right breast upper outer quadrant irregular mass  corresponding to the biopsy-proven breast cancer, measuring 4.5 cm  in total and including the satellite nodule or lobulation at its  superior aspect, which is within 2 mm of the dominant mass. It is  not clear from the provided images whether a clip was placed at  prior biopsy. If the patient is to undergo neoadjuvant  chemotherapy, placement of  a clip is highly recommended, because  the mass could become occult on imaging if there is a favorable  treatment response, and therefore may not be accurately localized  if lumpectomy is planned.  Extension of enhancement from the mass to the right nipple may  suggest nipple involvement.  No evidence for contralateral left-sided abnormal enhancement.    Assessment    Right side invasive ductal cancer     Plan    Port placement    I spent about 45 minutes discussing diagnosis, port placement, and eventual surgery with she and her parents.  Will plan on port placement for primary chemotherapy. I think she can eventually undergo a central lumpectomy. I don't think she will be able to save her nipple and areola at time of surgery. We discussed downsizing. We reviewed port placement and risks and benefits associated with that. We will make  decisions on surgery at completion of chemotherapy.  We discussed the staging and pathophysiology of breast cancer. We discussed all of the different options for treatment for breast cancer including surgery, chemotherapy, radiation therapy, Herceptin, and antiestrogen therapy.  We discussed a sentinel lymph node biopsy as she does not appear to having lymph node involvement right now. We discussed the performance of that with injection of radioactive tracer and blue dye. We discussed that she would have an incision underneath her axillary hairline. We discussed that there is a bout a 10-20% chance of having a positive node with a sentinel lymph node biopsy and we will await the permanent pathology to make any other first further decisions in terms of her treatment. One of these options might be to return to the operating room to perform an axillary lymph node dissection. We discussed about a 1-2% risk lifetime of chronic shoulder pain as well as lymphedema associated with a sentinel lymph node biopsy.  We discussed the options for treatment of the breast cancer which included lumpectomy versus a mastectomy. We discussed the performance of the lumpectomy with a wire placement. We discussed a 10-20% chance of a positive margin requiring reexcision in the operating room. We also discussed that she may need radiation therapy or antiestrogen therapy or both if she undergoes lumpectomy. We discussed the mastectomy and the postoperative care for that as well. We discussed that there is no difference in her survival whether she undergoes lumpectomy with radiation therapy or antiestrogen therapy versus a mastectomy. There is a slight difference in the local recurrence rate being 3-5% with lumpectomy and about 1% with a mastectomy.  We discussed the risks of operation including bleeding, infection, possible reoperation. She understands her further therapy will be based on what her stages at the time of her  operation.     Elai Vanwyk 09/10/2012, 4:11 PM

## 2012-09-11 ENCOUNTER — Other Ambulatory Visit (HOSPITAL_COMMUNITY): Payer: BC Managed Care – PPO

## 2012-09-11 MED ORDER — CEFAZOLIN SODIUM-DEXTROSE 2-3 GM-% IV SOLR
2.0000 g | INTRAVENOUS | Status: AC
Start: 2012-09-12 — End: 2012-09-12
  Administered 2012-09-12: 2 g via INTRAVENOUS
  Filled 2012-09-11: qty 50

## 2012-09-12 ENCOUNTER — Ambulatory Visit (HOSPITAL_COMMUNITY): Payer: BC Managed Care – PPO

## 2012-09-12 ENCOUNTER — Ambulatory Visit (HOSPITAL_COMMUNITY): Payer: BC Managed Care – PPO | Admitting: Anesthesiology

## 2012-09-12 ENCOUNTER — Encounter (HOSPITAL_COMMUNITY): Payer: Self-pay | Admitting: Anesthesiology

## 2012-09-12 ENCOUNTER — Ambulatory Visit (HOSPITAL_COMMUNITY)
Admission: RE | Admit: 2012-09-12 | Discharge: 2012-09-12 | Disposition: A | Discharge status: Home / Self Care | Payer: BC Managed Care – PPO | Source: Ambulatory Visit | Attending: General Surgery | Admitting: General Surgery

## 2012-09-12 ENCOUNTER — Encounter (HOSPITAL_COMMUNITY)
Admission: RE | Disposition: A | Discharge status: Home / Self Care | Payer: Self-pay | Source: Ambulatory Visit | Attending: General Surgery

## 2012-09-12 ENCOUNTER — Encounter (HOSPITAL_COMMUNITY): Payer: Self-pay | Admitting: *Deleted

## 2012-09-12 DIAGNOSIS — Z91013 Allergy to seafood: Secondary | ICD-10-CM | POA: Insufficient documentation

## 2012-09-12 DIAGNOSIS — Z170 Estrogen receptor positive status [ER+]: Secondary | ICD-10-CM | POA: Insufficient documentation

## 2012-09-12 DIAGNOSIS — F411 Generalized anxiety disorder: Secondary | ICD-10-CM | POA: Insufficient documentation

## 2012-09-12 DIAGNOSIS — K219 Gastro-esophageal reflux disease without esophagitis: Secondary | ICD-10-CM | POA: Insufficient documentation

## 2012-09-12 DIAGNOSIS — C50919 Malignant neoplasm of unspecified site of unspecified female breast: Secondary | ICD-10-CM

## 2012-09-12 DIAGNOSIS — C50419 Malignant neoplasm of upper-outer quadrant of unspecified female breast: Secondary | ICD-10-CM | POA: Insufficient documentation

## 2012-09-12 DIAGNOSIS — J309 Allergic rhinitis, unspecified: Secondary | ICD-10-CM | POA: Insufficient documentation

## 2012-09-12 HISTORY — PX: PORTACATH PLACEMENT: SHX2246

## 2012-09-12 SURGERY — INSERTION, TUNNELED CENTRAL VENOUS DEVICE, WITH PORT
Anesthesia: General | Site: Chest | Laterality: Left | Wound class: Clean

## 2012-09-12 MED ORDER — ONDANSETRON HCL 4 MG/2ML IJ SOLN
INTRAMUSCULAR | Status: DC | PRN
  Administered 2012-09-12: 4 mg via INTRAVENOUS

## 2012-09-12 MED ORDER — BUPIVACAINE HCL (PF) 0.25 % IJ SOLN
INTRAMUSCULAR | Status: AC
  Filled 2012-09-12: qty 30

## 2012-09-12 MED ORDER — SODIUM CHLORIDE 0.9 % IR SOLN
Status: DC | PRN
  Administered 2012-09-12: 08:00:00

## 2012-09-12 MED ORDER — DEXAMETHASONE SODIUM PHOSPHATE 10 MG/ML IJ SOLN
INTRAMUSCULAR | Status: DC | PRN
  Administered 2012-09-12: 4 mg via INTRAVENOUS

## 2012-09-12 MED ORDER — HEPARIN SOD (PORK) LOCK FLUSH 100 UNIT/ML IV SOLN
INTRAVENOUS | Status: AC
  Filled 2012-09-12: qty 5

## 2012-09-12 MED ORDER — DIPHENHYDRAMINE HCL 50 MG/ML IJ SOLN
6.2500 mg | Freq: Once | INTRAMUSCULAR | Status: AC
  Administered 2012-09-12: 6.25 mg via INTRAVENOUS

## 2012-09-12 MED ORDER — LACTATED RINGERS IV SOLN
INTRAVENOUS | Status: DC | PRN
  Administered 2012-09-12: 07:00:00 via INTRAVENOUS

## 2012-09-12 MED ORDER — FENTANYL CITRATE 0.05 MG/ML IJ SOLN
INTRAMUSCULAR | Status: DC | PRN
  Administered 2012-09-12 (×2): 25 ug via INTRAVENOUS
  Administered 2012-09-12: 100 ug via INTRAVENOUS
  Administered 2012-09-12: 50 ug via INTRAVENOUS

## 2012-09-12 MED ORDER — HYDROMORPHONE HCL PF 1 MG/ML IJ SOLN
INTRAMUSCULAR | Status: AC
  Filled 2012-09-12: qty 1

## 2012-09-12 MED ORDER — MIDAZOLAM HCL 5 MG/5ML IJ SOLN
INTRAMUSCULAR | Status: DC | PRN
  Administered 2012-09-12: 2 mg via INTRAVENOUS

## 2012-09-12 MED ORDER — HEPARIN SOD (PORK) LOCK FLUSH 100 UNIT/ML IV SOLN
INTRAVENOUS | Status: DC | PRN
  Administered 2012-09-12: 500 [IU] via INTRAVENOUS

## 2012-09-12 MED ORDER — PROPOFOL 10 MG/ML IV BOLUS
INTRAVENOUS | Status: DC | PRN
  Administered 2012-09-12: 200 mg via INTRAVENOUS

## 2012-09-12 MED ORDER — HYDROMORPHONE HCL PF 1 MG/ML IJ SOLN
0.2500 mg | INTRAMUSCULAR | Status: DC | PRN
  Administered 2012-09-12: 0.5 mg via INTRAVENOUS

## 2012-09-12 MED ORDER — OXYCODONE HCL 5 MG PO TABS: 5.0000 mg | ORAL_TABLET | Freq: Four times a day (QID) | ORAL | Status: DC | PRN

## 2012-09-12 MED ORDER — ONDANSETRON HCL 4 MG/2ML IJ SOLN
INTRAMUSCULAR | Status: AC
  Filled 2012-09-12: qty 2

## 2012-09-12 MED ORDER — DIPHENHYDRAMINE HCL 50 MG/ML IJ SOLN
INTRAMUSCULAR | Status: AC
  Filled 2012-09-12: qty 1

## 2012-09-12 MED ORDER — ONDANSETRON HCL 4 MG/2ML IJ SOLN
4.0000 mg | Freq: Once | INTRAMUSCULAR | Status: AC | PRN
  Administered 2012-09-12: 4 mg via INTRAVENOUS

## 2012-09-12 MED ORDER — BUPIVACAINE HCL (PF) 0.25 % IJ SOLN
INTRAMUSCULAR | Status: DC | PRN
  Administered 2012-09-12: 10 mL

## 2012-09-12 SURGICAL SUPPLY — 51 items
BAG DECANTER FOR FLEXI CONT (MISCELLANEOUS) ×2 IMPLANT
BLADE SURG 11 STRL SS (BLADE) ×2 IMPLANT
BLADE SURG 15 STRL LF DISP TIS (BLADE) ×1 IMPLANT
BLADE SURG 15 STRL SS (BLADE) ×1
CHLORAPREP W/TINT 26ML (MISCELLANEOUS) ×2 IMPLANT
CLOTH BEACON ORANGE TIMEOUT ST (SAFETY) ×2 IMPLANT
COVER SURGICAL LIGHT HANDLE (MISCELLANEOUS) ×2 IMPLANT
CRADLE DONUT ADULT HEAD (MISCELLANEOUS) ×2 IMPLANT
DECANTER SPIKE VIAL GLASS SM (MISCELLANEOUS) ×2 IMPLANT
DERMABOND ADVANCED (GAUZE/BANDAGES/DRESSINGS) ×1
DERMABOND ADVANCED .7 DNX12 (GAUZE/BANDAGES/DRESSINGS) ×1 IMPLANT
DRAPE C-ARM 42X72 X-RAY (DRAPES) ×2 IMPLANT
DRAPE LAPAROSCOPIC ABDOMINAL (DRAPES) ×2 IMPLANT
DRSG TEGADERM 4X4.75 (GAUZE/BANDAGES/DRESSINGS) ×8 IMPLANT
ELECT CAUTERY BLADE 6.4 (BLADE) ×2 IMPLANT
ELECT REM PT RETURN 9FT ADLT (ELECTROSURGICAL) ×2
ELECTRODE REM PT RTRN 9FT ADLT (ELECTROSURGICAL) ×1 IMPLANT
GAUZE SPONGE 4X4 16PLY XRAY LF (GAUZE/BANDAGES/DRESSINGS) ×2 IMPLANT
GLOVE BIO SURGEON STRL SZ7 (GLOVE) ×4 IMPLANT
GLOVE BIO SURGEON STRL SZ7.5 (GLOVE) ×4 IMPLANT
GLOVE BIOGEL PI IND STRL 7.5 (GLOVE) ×1 IMPLANT
GLOVE BIOGEL PI INDICATOR 7.5 (GLOVE) ×1
GOWN STRL NON-REIN LRG LVL3 (GOWN DISPOSABLE) ×4 IMPLANT
INTRODUCER COOK 11FR (CATHETERS) IMPLANT
KIT BASIN OR (CUSTOM PROCEDURE TRAY) ×2 IMPLANT
KIT PORT POWER 8FR ISP CVUE (Catheter) ×2 IMPLANT
KIT PORT POWER 9.6FR MRI PREA (Catheter) IMPLANT
KIT PORT POWER ISP 8FR (Catheter) IMPLANT
KIT POWER CATH 8FR (Catheter) IMPLANT
KIT ROOM TURNOVER OR (KITS) ×2 IMPLANT
NEEDLE HYPO 25GX1X1/2 BEV (NEEDLE) ×2 IMPLANT
NS IRRIG 1000ML POUR BTL (IV SOLUTION) ×2 IMPLANT
PACK SURGICAL SETUP 50X90 (CUSTOM PROCEDURE TRAY) ×2 IMPLANT
PAD ARMBOARD 7.5X6 YLW CONV (MISCELLANEOUS) ×4 IMPLANT
PENCIL BUTTON HOLSTER BLD 10FT (ELECTRODE) ×2 IMPLANT
SET INTRODUCER 12FR PACEMAKER (SHEATH) IMPLANT
SET SHEATH INTRODUCER 10FR (MISCELLANEOUS) IMPLANT
SHEATH COOK PEEL AWAY SET 9F (SHEATH) IMPLANT
SPONGE GAUZE 4X4 12PLY (GAUZE/BANDAGES/DRESSINGS) ×2 IMPLANT
SUT MNCRL AB 4-0 PS2 18 (SUTURE) ×2 IMPLANT
SUT PROLENE 2 0 SH 30 (SUTURE) ×2 IMPLANT
SUT SILK 2 0 (SUTURE)
SUT SILK 2-0 18XBRD TIE 12 (SUTURE) IMPLANT
SUT VIC AB 3-0 SH 27 (SUTURE) ×1
SUT VIC AB 3-0 SH 27XBRD (SUTURE) ×1 IMPLANT
SYR 20ML ECCENTRIC (SYRINGE) ×4 IMPLANT
SYR 5ML LUER SLIP (SYRINGE) ×2 IMPLANT
SYR CONTROL 10ML LL (SYRINGE) IMPLANT
TOWEL OR 17X24 6PK STRL BLUE (TOWEL DISPOSABLE) ×2 IMPLANT
TOWEL OR 17X26 10 PK STRL BLUE (TOWEL DISPOSABLE) ×2 IMPLANT
WATER STERILE IRR 1000ML POUR (IV SOLUTION) IMPLANT

## 2012-09-12 NOTE — Op Note (Signed)
Preoperative diagnosis: Clinical stage II right breast cancer Postoperative diagnosis: Same as above Procedure: Left subclavian power port insertion Surgeon: Dr. Harden Mo Anesthesia: Gen. With LMA Estimated blood loss: Minimal Complications: None Specimens: None Drains: None Sponge and needle count correct at completion Disposition to recovery stable  Indications: This is a 47 year old female who was recently diagnosed with a clinical stage II right breast cancer. We have elected and a multidisciplinary fashion to begin primary chemotherapy. I discussed with her port placement as well as future surgery.  Procedure: After informed consent was obtained the patient was taken to the operating room. She was administered cefazolin. Sequential compression devices were on her legs. She was placed under general anesthesia with an LMA.Her chest was then prepped and draped in the standard sterile surgical fashion. A surgical timeout was then performed.  I infiltrated Marcaine throughout her left chest as well as onto her clavicle. I then accessed the subclavian vein on the first pass. The wire did not immediately go into her vena cava but into a side branch. I then accessed the vein more lateral and this by fluoroscopy went into the vena cava easily. I then made a pocket below this overlying the pectoralis fascia. I then tunneled a line between the 2 sites. I then dilated my tract. I then inserted the dilator and the peel-away sheath. The line was then inserted. The peel-away sheath was removed. I then pulled the line back to be in the cava. I then attached this to the port. I sewed the port with 2-0 Prolene to the fascia. Upon completion this aspirated blood and flushed easily. I then accessed the port and flushed this with heparin. I left this accessed due to the fact he she is getting chemotherapy tomorrow. I then closes with 3-0 Vicryl, 4-0 Monocryl, and Dermabond. A dressing was placed over this to  cover it. She tolerated this well transferred to recovery stable.

## 2012-09-12 NOTE — H&P (Signed)
Elizabeth Miles is an 47 y.o. female.   Chief Complaint: referred by Dr Drue Second HPI: 51 yof who has had history of palpable right breast mass which was noted on her mm and exam this year.  She underwent a mm which showed large right breast central/uoq mass.  An u/s that shows a 3.6x2.8x3.1 cm mass with additional satellite that is immediately adjacent to this measuring 1.1x1.1x1.3 cm in size. This was all completed in [place].  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  MR has now been done which shows an irregular lobulated mass measuring in total 4.5x4x3.8 cm in size.  She has been seen by med onc and rad onc and they have discussed primary chemotherapy.  She comes to see me today to discuss eventual surgical therapy and port placement.  Past Medical History  Diagnosis Date  . 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 GERD (gastroesophageal reflux disease)   . GERD (gastroesophageal reflux disease) 08/22/2012  . Allergy   . Complication of anesthesia     1986 ; problem waking up  . Anxiety     Past Surgical History  Procedure Laterality Date  . Dilation and curettage of uterus  1993  . Cervical ablation  1983  . Breast biopsy Right 08/06/12  . Popliteal synovial cyst excision  1970    Family History  Problem Relation Age of Onset  . Hypertension Mother   . Cancer Maternal Uncle 73    bladder cancer  . Cancer Paternal Grandmother 2    lung cancer  . Cancer Paternal Grandfather 68    lower back cancer   Social History:  reports that she has never smoked. She has never used smokeless tobacco. She reports that she does not drink alcohol or use illicit drugs.  Allergies:  Allergies  Allergen Reactions  . Darvocet (Propoxyphene-Acetaminophen) Shortness Of Breath and Swelling  . Shellfish Allergy Shortness Of Breath and Swelling    Medications Prior to Admission  Medication Sig Dispense  Refill  . acetaminophen (TYLENOL) 325 MG tablet Take 650 mg by mouth every 4 (four) hours as needed for pain.      . cetirizine (ZYRTEC) 10 MG tablet Take 10 mg by mouth daily.      Marland Kitchen Dexlansoprazole (DEXILANT) 30 MG capsule Take 1 capsule (30 mg total) by mouth daily.  30 capsule  7  . ibuprofen (ADVIL,MOTRIN) 200 MG tablet Take 200 mg by mouth every 4 (four) hours as needed for pain.      . polyethylene glycol (MIRALAX / GLYCOLAX) packet Take 17 g by mouth as needed.      Marland Kitchen dexamethasone (DECADRON) 4 MG tablet Take 2 tablets by mouth once a day on the day after chemotherapy and then take 2 tablets two times a day for 2 days. Take with food.  30 tablet  1  . hyaluronate sodium (RADIAPLEXRX) GEL Apply 1 application topically once. Apply after rad txs and bedtime,prn      . lidocaine-prilocaine (EMLA) cream Apply topically as needed.  30 g  7  . LORazepam (ATIVAN) 0.5 MG tablet Take 1 tablet (0.5 mg total) by mouth every 6 (six) hours as needed (Nausea or vomiting).  30 tablet  0  . ondansetron (ZOFRAN) 8 MG tablet Take 1 tablet (8 mg total) by mouth 2 (two) times daily as needed. Take two times a day as needed for nausea or vomiting  starting on the third day after chemotherapy.  30 tablet  1  . prochlorperazine (COMPAZINE) 10 MG tablet Take 1 tablet (10 mg total) by mouth every 6 (six) hours as needed (Nausea or vomiting).  30 tablet  1    Results for orders placed during the hospital encounter of 09/10/12 (from the past 48 hour(s))  CBC     Status: None   Collection Time    09/10/12  1:03 PM      Result Value Range   WBC 4.8  4.0 - 10.5 K/uL   RBC 4.65  3.87 - 5.11 MIL/uL   Hemoglobin 13.7  12.0 - 15.0 g/dL   HCT 04.5  40.9 - 81.1 %   MCV 89.7  78.0 - 100.0 fL   MCH 29.5  26.0 - 34.0 pg   MCHC 32.9  30.0 - 36.0 g/dL   RDW 91.4  78.2 - 95.6 %   Platelets 264  150 - 400 K/uL   No results found.  Review of Systems  Constitutional: Negative for fever and chills.  HENT: Negative for sore  throat.   Eyes: Negative for blurred vision and double vision.  Respiratory: Negative for cough, hemoptysis, sputum production and shortness of breath.   Cardiovascular: Negative for chest pain, palpitations, orthopnea and claudication.  Gastrointestinal: Negative for nausea, vomiting, abdominal pain, diarrhea and constipation.  Genitourinary: Negative for dysuria, urgency and frequency.  Musculoskeletal: Negative for myalgias.  Neurological: Negative for dizziness.  Endo/Heme/Allergies: Does not bruise/bleed easily.    Blood pressure 123/79, pulse 93, temperature 98 F (36.7 C), temperature source Oral, resp. rate 18, last menstrual period 03/12/2012, SpO2 99.00%. Physical Exam  Constitutional: She appears well-developed and well-nourished.  HENT:  Head: Normocephalic and atraumatic.  Eyes: No scleral icterus.  Neck: Neck supple.  Cardiovascular: Normal rate, regular rhythm and normal heart sounds.   Respiratory: Effort normal and breath sounds normal. She has no wheezes. She has no rales. Right breast exhibits mass. Right breast exhibits no inverted nipple, no nipple discharge, no skin change and no tenderness. Left breast exhibits no inverted nipple, no mass, no nipple discharge, no skin change and no tenderness.    Lymphadenopathy:    She has no cervical adenopathy.    She has no axillary adenopathy.       Right: No supraclavicular adenopathy present.       Left: No supraclavicular adenopathy present.     Assessment/Plan Right breast cancer, locally advanced  Will plan on port placement for primary chemotherapy.  I think she can eventually undergo a central lumpectomy.  I don't think she will be able to save her nipple and areola at time of surgery.  We discussed downsizing.  We reviewed port placement and risks and benefits associated with that. We will make decisions on surgery at completion of chemotherapy. We discussed the staging and pathophysiology of breast cancer. We  discussed all of the different options for treatment for breast cancer including surgery, chemotherapy, radiation therapy, Herceptin, and antiestrogen therapy.   We discussed a sentinel lymph node biopsy as she does not appear to having lymph node involvement right now. We discussed the performance of that with injection of radioactive tracer and blue dye. We discussed that she would have an incision underneath her axillary hairline. We discussed that there is a bout a 10-20% chance of having a positive node with a sentinel lymph node biopsy and we will await the permanent pathology to make any other first further decisions in  terms of her treatment. One of these options might be to return to the operating room to perform an axillary lymph node dissection. We discussed about a 1-2% risk lifetime of chronic shoulder pain as well as lymphedema associated with a sentinel lymph node biopsy.  We discussed the options for treatment of the breast cancer which included lumpectomy versus a mastectomy. We discussed the performance of the lumpectomy with a wire placement. We discussed a 10-20% chance of a positive margin requiring reexcision in the operating room. We also discussed that she may need radiation therapy or antiestrogen therapy or both if she undergoes lumpectomy. We discussed the mastectomy and the postoperative care for that as well. We discussed that there is no difference in her survival whether she undergoes lumpectomy with radiation therapy or antiestrogen therapy versus a mastectomy. There is a slight difference in the local recurrence rate being 3-5% with lumpectomy and about 1% with a mastectomy. We discussed the risks of operation including bleeding, infection, possible reoperation. She understands her further therapy will be based on what her stages at the time of her operation.    Levetta Bognar 09/12/2012, 6:49 AM

## 2012-09-12 NOTE — Interval H&P Note (Signed)
History and Physical Interval Note:  09/12/2012 7:09 AM  Elizabeth Miles  has presented today for surgery, with the diagnosis of breast cancer   The various methods of treatment have been discussed with the patient and family. After consideration of risks, benefits and other options for treatment, the patient has consented to  Procedure(s): INSERTION PORT-A-CATH (N/A) as a surgical intervention .  The patient's history has been reviewed, patient examined, no change in status, stable for surgery.  I have reviewed the patient's chart and labs.  Questions were answered to the patient's satisfaction.     Susa Bones

## 2012-09-12 NOTE — Anesthesia Postprocedure Evaluation (Signed)
  Anesthesia Post-op Note  Patient: Elizabeth Miles  Procedure(s) Performed: Procedure(s): INSERTION PORT-A-CATH (Left)  Patient Location: PACU  Anesthesia Type:General  Level of Consciousness: awake, oriented, sedated and patient cooperative  Airway and Oxygen Therapy: Patient Spontanous Breathing  Post-op Pain: mild  Post-op Assessment: Post-op Vital signs reviewed, Patient's Cardiovascular Status Stable, Respiratory Function Stable, Patent Airway, No signs of Nausea or vomiting and Pain level controlled  Post-op Vital Signs: stable  Complications: No apparent anesthesia complications

## 2012-09-12 NOTE — Anesthesia Preprocedure Evaluation (Signed)
Anesthesia Evaluation  Patient identified by MRN, date of birth, ID band Patient awake    Reviewed: Allergy & Precautions, H&P , NPO status , Patient's Chart, lab work & pertinent test results  Airway Mallampati: I TM Distance: >3 FB Neck ROM: full    Dental   Pulmonary          Cardiovascular Rhythm:regular Rate:Normal     Neuro/Psych    GI/Hepatic GERD-  ,  Endo/Other    Renal/GU      Musculoskeletal   Abdominal   Peds  Hematology   Anesthesia Other Findings   Reproductive/Obstetrics                           Anesthesia Physical Anesthesia Plan  ASA: II  Anesthesia Plan: General   Post-op Pain Management:    Induction: Intravenous  Airway Management Planned: Oral ETT and LMA  Additional Equipment:   Intra-op Plan:   Post-operative Plan: Extubation in OR  Informed Consent: I have reviewed the patients History and Physical, chart, labs and discussed the procedure including the risks, benefits and alternatives for the proposed anesthesia with the patient or authorized representative who has indicated his/her understanding and acceptance.     Plan Discussed with: CRNA, Anesthesiologist and Surgeon  Anesthesia Plan Comments:         Anesthesia Quick Evaluation

## 2012-09-12 NOTE — Transfer of Care (Signed)
Immediate Anesthesia Transfer of Care Note  Patient: Elizabeth Miles  Procedure(s) Performed: Procedure(s): INSERTION PORT-A-CATH (Left)  Patient Location: PACU  Anesthesia Type:General  Level of Consciousness: awake, alert  and oriented  Airway & Oxygen Therapy: Patient Spontanous Breathing and Patient connected to nasal cannula oxygen  Post-op Assessment: Report given to PACU RN and Post -op Vital signs reviewed and stable  Post vital signs: Reviewed and stable  Complications: No apparent anesthesia complications

## 2012-09-13 ENCOUNTER — Encounter: Payer: Self-pay | Admitting: Oncology

## 2012-09-13 ENCOUNTER — Other Ambulatory Visit (HOSPITAL_BASED_OUTPATIENT_CLINIC_OR_DEPARTMENT_OTHER): Payer: BC Managed Care – PPO

## 2012-09-13 ENCOUNTER — Ambulatory Visit (HOSPITAL_BASED_OUTPATIENT_CLINIC_OR_DEPARTMENT_OTHER): Payer: BC Managed Care – PPO | Admitting: Oncology

## 2012-09-13 ENCOUNTER — Ambulatory Visit (HOSPITAL_BASED_OUTPATIENT_CLINIC_OR_DEPARTMENT_OTHER): Payer: BC Managed Care – PPO

## 2012-09-13 ENCOUNTER — Telehealth: Payer: Self-pay | Admitting: Oncology

## 2012-09-13 VITALS — BP 122/83 | HR 72 | Temp 98.4°F | Resp 20 | Ht 67.0 in | Wt 189.4 lb

## 2012-09-13 DIAGNOSIS — C50911 Malignant neoplasm of unspecified site of right female breast: Secondary | ICD-10-CM

## 2012-09-13 DIAGNOSIS — C50419 Malignant neoplasm of upper-outer quadrant of unspecified female breast: Secondary | ICD-10-CM

## 2012-09-13 DIAGNOSIS — Z170 Estrogen receptor positive status [ER+]: Secondary | ICD-10-CM

## 2012-09-13 DIAGNOSIS — Z5111 Encounter for antineoplastic chemotherapy: Secondary | ICD-10-CM

## 2012-09-13 DIAGNOSIS — C50919 Malignant neoplasm of unspecified site of unspecified female breast: Secondary | ICD-10-CM

## 2012-09-13 LAB — CBC WITH DIFFERENTIAL/PLATELET
BASO%: 0.5 % (ref 0.0–2.0)
HCT: 40.2 % (ref 34.8–46.6)
LYMPH%: 26.8 % (ref 14.0–49.7)
MCH: 30.3 pg (ref 25.1–34.0)
MCHC: 33.7 g/dL (ref 31.5–36.0)
MCV: 89.7 fL (ref 79.5–101.0)
MONO#: 0.5 10*3/uL (ref 0.1–0.9)
MONO%: 7.3 % (ref 0.0–14.0)
NEUT%: 62.8 % (ref 38.4–76.8)
Platelets: 261 10*3/uL (ref 145–400)
WBC: 7.2 10*3/uL (ref 3.9–10.3)

## 2012-09-13 LAB — COMPREHENSIVE METABOLIC PANEL (CC13)
ALT: 25 U/L (ref 0–55)
Alkaline Phosphatase: 54 U/L (ref 40–150)
CO2: 27 mEq/L (ref 22–29)
Creatinine: 0.8 mg/dL (ref 0.6–1.1)
Total Bilirubin: 0.57 mg/dL (ref 0.20–1.20)

## 2012-09-13 MED ORDER — DEXAMETHASONE SODIUM PHOSPHATE 20 MG/5ML IJ SOLN
12.0000 mg | Freq: Once | INTRAMUSCULAR | Status: AC
  Administered 2012-09-13: 12 mg via INTRAVENOUS

## 2012-09-13 MED ORDER — LORAZEPAM 2 MG/ML IJ SOLN
0.5000 mg | Freq: Once | INTRAMUSCULAR | Status: AC
  Administered 2012-09-13: 0.5 mg via INTRAVENOUS

## 2012-09-13 MED ORDER — SODIUM CHLORIDE 0.9 % IV SOLN
150.0000 mg | Freq: Once | INTRAVENOUS | Status: AC
  Administered 2012-09-13: 150 mg via INTRAVENOUS
  Filled 2012-09-13: qty 5

## 2012-09-13 MED ORDER — DOXORUBICIN HCL CHEMO IV INJECTION 2 MG/ML
60.0000 mg/m2 | Freq: Once | INTRAVENOUS | Status: AC
  Administered 2012-09-13: 120 mg via INTRAVENOUS
  Filled 2012-09-13: qty 60

## 2012-09-13 MED ORDER — SODIUM CHLORIDE 0.9 % IJ SOLN
10.0000 mL | INTRAMUSCULAR | Status: DC | PRN
  Administered 2012-09-13: 10 mL
  Filled 2012-09-13: qty 10

## 2012-09-13 MED ORDER — PALONOSETRON HCL INJECTION 0.25 MG/5ML
0.2500 mg | Freq: Once | INTRAVENOUS | Status: AC
  Administered 2012-09-13: 0.25 mg via INTRAVENOUS

## 2012-09-13 MED ORDER — SODIUM CHLORIDE 0.9 % IV SOLN
600.0000 mg/m2 | Freq: Once | INTRAVENOUS | Status: AC
  Administered 2012-09-13: 1200 mg via INTRAVENOUS
  Filled 2012-09-13: qty 60

## 2012-09-13 MED ORDER — HEPARIN SOD (PORK) LOCK FLUSH 100 UNIT/ML IV SOLN
500.0000 [IU] | Freq: Once | INTRAVENOUS | Status: AC | PRN
  Administered 2012-09-13: 500 [IU]
  Filled 2012-09-13: qty 5

## 2012-09-13 MED ORDER — SODIUM CHLORIDE 0.9 % IV SOLN
Freq: Once | INTRAVENOUS | Status: AC
  Administered 2012-09-13: 15:00:00 via INTRAVENOUS

## 2012-09-13 NOTE — Progress Notes (Signed)
Normal saline flowing freely while pushing Adriamycin. Positive blood return before, during and after Adriamycin push.

## 2012-09-13 NOTE — Patient Instructions (Signed)
[facility] Discharge Instructions for Patients Receiving Chemotherapy  Today you received the following chemotherapy agents Adriamycin/Cytoxan To help prevent nausea and vomiting after your treatment, we encourage you to take your nausea medication as prescribed.If you develop nausea and vomiting that is not controlled by your nausea medication, call the clinic.   BELOW ARE SYMPTOMS THAT SHOULD BE REPORTED IMMEDIATELY:  *FEVER GREATER THAN 100.5 F  *CHILLS WITH OR WITHOUT FEVER  NAUSEA AND VOMITING THAT IS NOT CONTROLLED WITH YOUR NAUSEA MEDICATION  *UNUSUAL SHORTNESS OF BREATH  *UNUSUAL BRUISING OR BLEEDING  TENDERNESS IN MOUTH AND THROAT WITH OR WITHOUT PRESENCE OF ULCERS  *URINARY PROBLEMS  *BOWEL PROBLEMS  UNUSUAL RASH Items with * indicate a potential emergency and should be followed up as soon as possible.  Feel free to call the clinic you have any questions or concerns. The clinic phone number is [phone].   Doxorubicin injection (Adriamycin) What is this medicine? DOXORUBICIN (dox oh ROO bi sin) is a chemotherapy drug. It is used to treat many kinds of cancer like Hodgkin's disease, leukemia, non-Hodgkin's lymphoma, neuroblastoma, sarcoma, and Wilms' tumor. It is also used to treat bladder cancer, breast cancer, lung cancer, ovarian cancer, stomach cancer, and thyroid cancer. This medicine may be used for other purposes; ask your health care provider or pharmacist if you have questions. What should I tell my health care provider before I take this medicine? They need to know if you have any of these conditions: -blood disorders -heart disease, recent heart attack -infection (especially a virus infection such as chickenpox, cold sores, or herpes) -irregular heartbeat -liver disease -recent or ongoing radiation therapy -an unusual or allergic reaction to doxorubicin, other chemotherapy agents, other medicines, foods, dyes, or  preservatives -pregnant or trying to get pregnant -breast-feeding How should I use this medicine? This drug is given as an infusion into a vein. It is administered in a hospital or clinic by a specially trained health care professional. If you have pain, swelling, burning or any unusual feeling around the site of your injection, tell your health care professional right away. Talk to your pediatrician regarding the use of this medicine in children. Special care may be needed. Overdosage: If you think you have taken too much of this medicine contact a poison control center or emergency room at once. NOTE: This medicine is only for you. Do not share this medicine with others. What if I miss a dose? It is important not to miss your dose. Call your doctor or health care professional if you are unable to keep an appointment. What may interact with this medicine? Do not take this medicine with any of the following medications: -cisapride -droperidol -halofantrine -pimozide -zidovudine This medicine may also interact with the following medications: -chloroquine -chlorpromazine -clarithromycin -cyclophosphamide -cyclosporine -erythromycin -medicines for depression, anxiety, or psychotic disturbances -medicines for irregular heart beat like amiodarone, bepridil, dofetilide, encainide, flecainide, propafenone, quinidine -medicines for seizures like ethotoin, fosphenytoin, phenytoin -medicines for nausea, vomiting like dolasetron, ondansetron, palonosetron -medicines to increase blood counts like filgrastim, pegfilgrastim, sargramostim -methadone -methotrexate -pentamidine -progesterone -vaccines -verapamil Talk to your doctor or health care professional before taking any of these medicines: -acetaminophen -aspirin -ibuprofen -ketoprofen -naproxen This list may not describe all possible interactions. Give your health care provider a list of all the medicines, herbs, non-prescription  drugs, or dietary supplements you use. Also tell them if you smoke, drink alcohol, or use illegal drugs. Some items may interact with your medicine. What should I   watch for while using this medicine? Your condition will be monitored carefully while you are receiving this medicine. You will need important blood work done while you are taking this medicine. This drug may make you feel generally unwell. This is not uncommon, as chemotherapy can affect healthy cells as well as cancer cells. Report any side effects. Continue your course of treatment even though you feel ill unless your doctor tells you to stop. Your urine may turn red for a few days after your dose. This is not blood. If your urine is dark or brown, call your doctor. In some cases, you may be given additional medicines to help with side effects. Follow all directions for their use. Call your doctor or health care professional for advice if you get a fever, chills or sore throat, or other symptoms of a cold or flu. Do not treat yourself. This drug decreases your body's ability to fight infections. Try to avoid being around people who are sick. This medicine may increase your risk to bruise or bleed. Call your doctor or health care professional if you notice any unusual bleeding. Be careful brushing and flossing your teeth or using a toothpick because you may get an infection or bleed more easily. If you have any dental work done, tell your dentist you are receiving this medicine. Avoid taking products that contain aspirin, acetaminophen, ibuprofen, naproxen, or ketoprofen unless instructed by your doctor. These medicines may hide a fever. Men and women of childbearing age should use effective birth control methods while using taking this medicine. Do not become pregnant while taking this medicine. There is a potential for serious side effects to an unborn child. Talk to your health care professional or pharmacist for more information. Do not  breast-feed an infant while taking this medicine. Do not let others touch your urine or other body fluids for 5 days after each treatment with this medicine. Caregivers should wear latex gloves to avoid touching body fluids during this time. What side effects may I notice from receiving this medicine? Side effects that you should report to your doctor or health care professional as soon as possible: -allergic reactions like skin rash, itching or hives, swelling of the face, lips, or tongue -low blood counts - this medicine may decrease the number of white blood cells, red blood cells and platelets. You may be at increased risk for infections and bleeding. -signs of infection - fever or chills, cough, sore throat, pain or difficulty passing urine -signs of decreased platelets or bleeding - bruising, pinpoint red spots on the skin, black, tarry stools, blood in the urine -signs of decreased red blood cells - unusually weak or tired, fainting spells, lightheadedness -breathing problems -chest pain -fast, irregular heartbeat -mouth sores -nausea, vomiting -pain, swelling, redness at site where injected -pain, tingling, numbness in the hands or feet -swelling of ankles, feet, or hands -unusual bleeding or bruising Side effects that usually do not require medical attention (report to your doctor or health care professional if they continue or are bothersome): -diarrhea -facial flushing -hair loss -loss of appetite -missed menstrual periods -nail discoloration or damage -red or watery eyes -red colored urine -stomach upset This list may not describe all possible side effects. Call your doctor for medical advice about side effects. You may report side effects to FDA at 1-800-FDA-1088. Where should I keep my medicine? This drug is given in a hospital or clinic and will not be stored at home. NOTE: This sheet is a summary.   It may not cover all possible information. If you have questions about  this medicine, talk to your doctor, pharmacist, or health care provider.  2013, [employer]. (06/04/2007 5:07:32 PM)   Cyclophosphamide injection (Cytoxan) What is this medicine? CYCLOPHOSPHAMIDE (sye kloe FOSS fa mide) is a chemotherapy drug. It slows the growth of cancer cells. This medicine is used to treat many types of cancer like lymphoma, myeloma, leukemia, breast cancer, and ovarian cancer, to name a few. It is also used to treat nephrotic syndrome in children. This medicine may be used for other purposes; ask your health care provider or pharmacist if you have questions. What should I tell my health care provider before I take this medicine? They need to know if you have any of these conditions: -blood disorders -history of other chemotherapy -history of radiation therapy -infection -kidney disease -liver disease -tumors in the bone marrow -an unusual or allergic reaction to cyclophosphamide, other chemotherapy, other medicines, foods, dyes, or preservatives -pregnant or trying to get pregnant -breast-feeding How should I use this medicine? This drug is usually given as an injection into a vein or muscle or by infusion into a vein. It is administered in a hospital or clinic by a specially trained health care professional. Talk to your pediatrician regarding the use of this medicine in children. While this drug may be prescribed for selected conditions, precautions do apply. Overdosage: If you think you have taken too much of this medicine contact a poison control center or emergency room at once. NOTE: This medicine is only for you. Do not share this medicine with others. What if I miss a dose? It is important not to miss your dose. Call your doctor or health care professional if you are unable to keep an appointment. What may interact with this medicine? Do not take this medicine with any of the following medications: -mibefradil -nalidixic acid This medicine may  also interact with the following medications: -doxorubicin -etanercept -medicines to increase blood counts like filgrastim, pegfilgrastim, sargramostim -medicines that block muscle or nerve pain -St. John's Wort -phenobarbital -succinylcholine chloride -trastuzumab -vaccines Talk to your doctor or health care professional before taking any of these medicines: -acetaminophen -aspirin -ibuprofen -ketoprofen -naproxen This list may not describe all possible interactions. Give your health care provider a list of all the medicines, herbs, non-prescription drugs, or dietary supplements you use. Also tell them if you smoke, drink alcohol, or use illegal drugs. Some items may interact with your medicine. What should I watch for while using this medicine? Visit your doctor for checks on your progress. This drug may make you feel generally unwell. This is not uncommon, as chemotherapy can affect healthy cells as well as cancer cells. Report any side effects. Continue your course of treatment even though you feel ill unless your doctor tells you to stop. Drink water or other fluids as directed. Urinate often, even at night. In some cases, you may be given additional medicines to help with side effects. Follow all directions for their use. Call your doctor or health care professional for advice if you get a fever, chills or sore throat, or other symptoms of a cold or flu. Do not treat yourself. This drug decreases your body's ability to fight infections. Try to avoid being around people who are sick. This medicine may increase your risk to bruise or bleed. Call your doctor or health care professional if you notice any unusual bleeding. Be careful brushing and flossing your teeth or using a toothpick   because you may get an infection or bleed more easily. If you have any dental work done, tell your dentist you are receiving this medicine. Avoid taking products that contain aspirin, acetaminophen,  ibuprofen, naproxen, or ketoprofen unless instructed by your doctor. These medicines may hide a fever. Do not become pregnant while taking this medicine. Women should inform their doctor if they wish to become pregnant or think they might be pregnant. There is a potential for serious side effects to an unborn child. Talk to your health care professional or pharmacist for more information. Do not breast-feed an infant while taking this medicine. Men should inform their doctor if they wish to father a child. This medicine may lower sperm counts. If you are going to have surgery, tell your doctor or health care professional that you have taken this medicine. What side effects may I notice from receiving this medicine? Side effects that you should report to your doctor or health care professional as soon as possible: -allergic reactions like skin rash, itching or hives, swelling of the face, lips, or tongue -low blood counts - this medicine may decrease the number of white blood cells, red blood cells and platelets. You may be at increased risk for infections and bleeding. -signs of infection - fever or chills, cough, sore throat, pain or difficulty passing urine -signs of decreased platelets or bleeding - bruising, pinpoint red spots on the skin, black, tarry stools, blood in the urine -signs of decreased red blood cells - unusually weak or tired, fainting spells, lightheadedness -breathing problems -dark urine -mouth sores -pain, swelling, redness at site where injected -swelling of the ankles, feet, hands -trouble passing urine or change in the amount of urine -weight gain -yellowing of the eyes or skin Side effects that usually do not require medical attention (report to your doctor or health care professional if they continue or are bothersome): -changes in nail or skin color -diarrhea -hair loss -loss of appetite -missed menstrual periods -nausea, vomiting -stomach pain This list may not  describe all possible side effects. Call your doctor for medical advice about side effects. You may report side effects to FDA at 1-800-FDA-1088. Where should I keep my medicine? This drug is given in a hospital or clinic and will not be stored at home. NOTE: This sheet is a summary. It may not cover all possible information. If you have questions about this medicine, talk to your doctor, pharmacist, or health care provider.  2013, [employer] Standard. (05/21/2007 2:32:25 PM)  

## 2012-09-13 NOTE — Telephone Encounter (Signed)
Added appt for 7/25 pe r7/18 pof. Pt given new schedule.

## 2012-09-13 NOTE — Patient Instructions (Addendum)
Proceed with cycle 1 of AC today  We added ativan to help relax you prior to the chemotherapy  Return on 7/19 for neulasta injection  I will see you back in 1 week for follow up

## 2012-09-14 ENCOUNTER — Ambulatory Visit (HOSPITAL_BASED_OUTPATIENT_CLINIC_OR_DEPARTMENT_OTHER): Payer: BC Managed Care – PPO

## 2012-09-14 DIAGNOSIS — C50919 Malignant neoplasm of unspecified site of unspecified female breast: Secondary | ICD-10-CM

## 2012-09-14 MED ORDER — PEGFILGRASTIM INJECTION 6 MG/0.6ML
6.0000 mg | Freq: Once | SUBCUTANEOUS | Status: AC
  Administered 2012-09-14: 6 mg via SUBCUTANEOUS

## 2012-09-16 ENCOUNTER — Encounter (HOSPITAL_COMMUNITY): Payer: Self-pay | Admitting: General Surgery

## 2012-09-16 ENCOUNTER — Telehealth: Payer: Self-pay | Admitting: *Deleted

## 2012-09-16 NOTE — Telephone Encounter (Signed)
*  See Doc Flowsheet*Today is day #3 and feels drowsy and sedated from the steroid and claritin she is taking. Has had no nausea. Asking if she can decrease the steroid dose or stop it entirely now? Has not had BM since Friday either. Suggested she stop the decadron if she feels this is causing her to feel weak/fatigued. Push fluids and start taking her MiraLax daily today. Instructed her to take her Zofran prn for nausea. Urine is clear and pale yellow. Encouraged her to continue to eat small frequent meals all day. She denies any bone pain, just feels "heavy".

## 2012-09-17 ENCOUNTER — Other Ambulatory Visit: Payer: Self-pay | Admitting: Certified Registered Nurse Anesthetist

## 2012-09-17 ENCOUNTER — Telehealth (INDEPENDENT_AMBULATORY_CARE_PROVIDER_SITE_OTHER): Payer: Self-pay

## 2012-09-17 NOTE — Telephone Encounter (Signed)
LMOM giving pt her f/u appt with Dr Dwain Sarna 9/12 at 9:30. Dr Dwain Sarna said he would need to check the pt in two months after her starting chemo on 09/12/12.

## 2012-09-20 ENCOUNTER — Ambulatory Visit (HOSPITAL_BASED_OUTPATIENT_CLINIC_OR_DEPARTMENT_OTHER): Payer: BC Managed Care – PPO

## 2012-09-20 ENCOUNTER — Ambulatory Visit (HOSPITAL_BASED_OUTPATIENT_CLINIC_OR_DEPARTMENT_OTHER): Payer: BC Managed Care – PPO | Admitting: Oncology

## 2012-09-20 ENCOUNTER — Other Ambulatory Visit: Payer: Self-pay | Admitting: Medical Oncology

## 2012-09-20 ENCOUNTER — Telehealth (INDEPENDENT_AMBULATORY_CARE_PROVIDER_SITE_OTHER): Payer: Self-pay | Admitting: General Surgery

## 2012-09-20 ENCOUNTER — Encounter: Payer: Self-pay | Admitting: Oncology

## 2012-09-20 VITALS — BP 124/83 | HR 97 | Temp 98.9°F | Resp 20 | Ht 67.0 in | Wt 188.8 lb

## 2012-09-20 VITALS — BP 125/83 | HR 95 | Temp 97.0°F | Resp 16

## 2012-09-20 DIAGNOSIS — C50919 Malignant neoplasm of unspecified site of unspecified female breast: Secondary | ICD-10-CM

## 2012-09-20 DIAGNOSIS — D709 Neutropenia, unspecified: Secondary | ICD-10-CM

## 2012-09-20 DIAGNOSIS — C50911 Malignant neoplasm of unspecified site of right female breast: Secondary | ICD-10-CM

## 2012-09-20 DIAGNOSIS — R5383 Other fatigue: Secondary | ICD-10-CM

## 2012-09-20 LAB — CBC WITH DIFFERENTIAL/PLATELET
Basophils Absolute: 0 10*3/uL (ref 0.0–0.1)
EOS%: 10.6 % — ABNORMAL HIGH (ref 0.0–7.0)
Eosinophils Absolute: 0.1 10*3/uL (ref 0.0–0.5)
HCT: 36.7 % (ref 34.8–46.6)
HGB: 12.5 g/dL (ref 11.6–15.9)
MCH: 30.1 pg (ref 25.1–34.0)
NEUT#: 0.3 10*3/uL — CL (ref 1.5–6.5)
NEUT%: 21.6 % — ABNORMAL LOW (ref 38.4–76.8)
RDW: 12.3 % (ref 11.2–14.5)
lymph#: 0.8 10*3/uL — ABNORMAL LOW (ref 0.9–3.3)

## 2012-09-20 MED ORDER — SODIUM CHLORIDE 0.9 % IV SOLN
INTRAVENOUS | Status: AC
  Administered 2012-09-20: 17:00:00 via INTRAVENOUS

## 2012-09-20 MED ORDER — HEPARIN SOD (PORK) LOCK FLUSH 100 UNIT/ML IV SOLN
500.0000 [IU] | Freq: Once | INTRAVENOUS | Status: AC
  Administered 2012-09-20: 500 [IU] via INTRAVENOUS
  Filled 2012-09-20: qty 5

## 2012-09-20 MED ORDER — SODIUM CHLORIDE 0.9 % IJ SOLN
10.0000 mL | INTRAMUSCULAR | Status: DC | PRN
  Administered 2012-09-20: 10 mL via INTRAVENOUS
  Filled 2012-09-20: qty 10

## 2012-09-20 MED ORDER — CIPROFLOXACIN HCL 500 MG PO TABS: 500.0000 mg | ORAL_TABLET | Freq: Two times a day (BID) | ORAL | Status: DC

## 2012-09-20 NOTE — Patient Instructions (Addendum)
Dehydration, Adult Dehydration is when you lose more fluids from the body than you take in. Vital organs like the kidneys, brain, and heart cannot function without a proper amount of fluids and salt. Any loss of fluids from the body can cause dehydration.  CAUSES   Vomiting.  Diarrhea.  Excessive sweating.  Excessive urine output.  Fever. SYMPTOMS  Mild dehydration  Thirst.  Dry lips.  Slightly dry mouth. Moderate dehydration  Very dry mouth.  Sunken eyes.  Skin does not bounce back quickly when lightly pinched and released.  Dark urine and decreased urine production.  Decreased tear production.  Headache. Severe dehydration  Very dry mouth.  Extreme thirst.  Rapid, weak pulse (more than 100 beats per minute at rest).  Cold hands and feet.  Not able to sweat in spite of heat and temperature.  Rapid breathing.  Blue lips.  Confusion and lethargy.  Difficulty being awakened.  Minimal urine production.  No tears. DIAGNOSIS  Your caregiver will diagnose dehydration based on your symptoms and your exam. Blood and urine tests will help confirm the diagnosis. The diagnostic evaluation should also identify the cause of dehydration. TREATMENT  Treatment of mild or moderate dehydration can often be done at home by increasing the amount of fluids that you drink. It is best to drink small amounts of fluid more often. Drinking too much at one time can make vomiting worse. Refer to the home care instructions below. Severe dehydration needs to be treated at the hospital where you will probably be given intravenous (IV) fluids that contain water and electrolytes. HOME CARE INSTRUCTIONS   Ask your caregiver about specific rehydration instructions.  Drink enough fluids to keep your urine clear or pale yellow.  Drink small amounts frequently if you have nausea and vomiting.  Eat as you normally do.  Avoid:  Foods or drinks high in sugar.  Carbonated  drinks.  Juice.  Extremely hot or cold fluids.  Drinks with caffeine.  Fatty, greasy foods.  Alcohol.  Tobacco.  Overeating.  Gelatin desserts.  Wash your hands well to avoid spreading bacteria and viruses.  Only take over-the-counter or prescription medicines for pain, discomfort, or fever as directed by your caregiver.  Ask your caregiver if you should continue all prescribed and over-the-counter medicines.  Keep all follow-up appointments with your caregiver. SEEK MEDICAL CARE IF:  You have abdominal pain and it increases or stays in one area (localizes).  You have a rash, stiff neck, or severe headache.  You are irritable, sleepy, or difficult to awaken.  You are weak, dizzy, or extremely thirsty. SEEK IMMEDIATE MEDICAL CARE IF:   You are unable to keep fluids down or you get worse despite treatment.  You have frequent episodes of vomiting or diarrhea.  You have blood or green matter (bile) in your vomit.  You have blood in your stool or your stool looks black and tarry.  You have not urinated in 6 to 8 hours, or you have only urinated a small amount of very dark urine.  You have a fever.  You faint. MAKE SURE YOU:   Understand these instructions.  Will watch your condition.  Will get help right away if you are not doing well or get worse. Document Released: 02/13/2005 Document Revised: 05/08/2011 Document Reviewed: 10/03/2010 [employer] Patient Information 2014 [employer].  

## 2012-09-20 NOTE — Progress Notes (Signed)
1743 - pt c/o "don't feel good".  Describes as "weakness, SOB, dizzy, cold".  O2 at 1L, warm blanket and chair, reclined patient.  VS taken - see Epic. 1802 - VS - see Epic.  Pt reports feeling a "little better".

## 2012-09-20 NOTE — Telephone Encounter (Signed)
She called while I was in surgery saying that her surgical glue was removed for a fluid infusion and the wound may be slightly opened but not draining and does not appear infected.  I recommended that she cover this with an oversized sterile band aid.  The nurse instructed her in wound care.  I tried to call her back after I was out of surgery but there was no answer of her phone [phone].  I left a voicemail to call me back.

## 2012-09-23 ENCOUNTER — Other Ambulatory Visit: Payer: Self-pay | Admitting: Emergency Medicine

## 2012-09-24 ENCOUNTER — Other Ambulatory Visit: Payer: Self-pay | Admitting: Emergency Medicine

## 2012-09-24 ENCOUNTER — Telehealth: Payer: Self-pay | Admitting: *Deleted

## 2012-09-24 NOTE — Telephone Encounter (Signed)
i returned the pt call however i was told she went up the street...td

## 2012-09-24 NOTE — Progress Notes (Signed)
OFFICE PROGRESS NOTE  CC  [name],[name], MD [address] [place] [address] Dr. Reuel Boom Pomposini  Dr. Emelia Loron  Dr. Lurline Hare  DIAGNOSIS: 47 year old female with new diagnosis of stage II right breast cancer  STAGE:  Right breast  Stage II,T2NxMx  ER+PR-Her2Neu-  Ki-67 high   PRIOR THERAPY:  #1June 2013 had a mammogram that was normal. But there was on physical exam possibility of a cyst noted in the right breast. The mammogram was negative. In 2014 June patient noted on exam another lump in the right breast. She underwent a diagnostic mammogram on June 10 that showed a right breast nodule in the outer quadrant. She had an ultrasound performed that showed at the 9:30 o'clock position a 3.6 cm area and then added 10:00 position 1.3 cm area with a total area being anywhere between 5-6 cm. The patient went on to have a right breast biopsy performed in [place]. The pathology revealed an invasive ductal carcinoma. This is been confirmed by our pathology as well. The carcinoma and papillary features and was felt to be between a grade 1 and 2. The tumor was estrogen receptor positive strongly (100%) progesterone receptor negative HER-2/neu negative with a Ki-67 that showed a high proliferation rate.  #2 patient was seen by me on 08/22/2012 to discuss treatment options. Due to the size of the tumor I had recommended that patient proceed with neoadjuvant treatment initially consisting of chemotherapy. However due to the workup being incomplete I did recommend that she undergo MRI of the breasts as well as staging studies. When she has had. MRI of the breasts performed on 08/27/2012 revealed in the right upper quadrant irregular lobulated mass with a satellite nodule in ambulation within 2 mm at its superior aspect measured together as 4.5 x 4.0 x 3.8 cm. No lymphadenopathy was noted there was no any other area of abnormal enhancement in either breast. Patient also had PET scans  performed on 73 that revealed the primary breast cancer measuring 3.2 x 3.3 cm with SUV of 16. It was adjacent nodule along his pure medial border of the primary mass measuring 1.4 cm which was also hypermetabolic. There were no additional areas of abnormal hypermetabolism In the chest. Abdomen and pelvis showed no abnormal hypermetabolic activity within the liver pancreas adrenal glands or spleen. No hypermetabolic lymph nodes. In the skeleton no focal hypermetabolic activity to suggest skeletal metastasis.  #3 patient was also seen by Dr. Emelia Loron as well as Dr. Lurline Hare in consultation. Dr. Dwain Sarna has agreed to place a port for eventual chemotherapy. Patient also has had chemotherapy teaching class. The port will be placed on 09/12/2012.  CURRENT THERAPY:Patient will proceed with neoadjuvant chemotherapy  INTERVAL HISTORY: Elizabeth Miles 47 y.o. female returns for followup visit today to discuss her scans. Overall she's doing well. She has also been seen by Dr. Lurline Hare. She was pleased with the appointment. Patient has an appointment set up to be seen by Dr. Emelia Loron on 09/10/2012. Clinically patient seems to still be struggling with the diagnosis and very anxious and nervous about situation. She is having significant problems with gastroesophageal reflux disease as well. I've given her refill prescription. She occasionally does get nausea. Remainder of the 10 point review of systems is negative.  MEDICAL HISTORY: Past Medical History  Diagnosis Date  . 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 GERD (gastroesophageal reflux disease)   . GERD (gastroesophageal reflux disease) 08/22/2012  .  Allergy   . Complication of anesthesia     1986 ; problem waking up  . Anxiety     ALLERGIES:  is allergic to darvocet and shellfish allergy.  MEDICATIONS:  Current Outpatient Prescriptions  Medication Sig Dispense Refill  . acetaminophen (TYLENOL) 325 MG  tablet Take 650 mg by mouth every 4 (four) hours as needed for pain.      . cetirizine (ZYRTEC) 10 MG tablet Take 10 mg by mouth daily.      Marland Kitchen ibuprofen (ADVIL,MOTRIN) 200 MG tablet Take 200 mg by mouth every 4 (four) hours as needed for pain.      . polyethylene glycol (MIRALAX / GLYCOLAX) packet Take 17 g by mouth as needed.      . ciprofloxacin (CIPRO) 500 MG tablet Take 1 tablet (500 mg total) by mouth 2 (two) times daily.  14 tablet  0  . dexamethasone (DECADRON) 4 MG tablet Take 2 tablets by mouth once a day on the day after chemotherapy and then take 2 tablets two times a day for 2 days. Take with food.  30 tablet  1  . Dexlansoprazole (DEXILANT) 30 MG capsule Take 1 capsule (30 mg total) by mouth daily.  30 capsule  7  . hyaluronate sodium (RADIAPLEXRX) GEL Apply 1 application topically once. Apply after rad txs and bedtime,prn      . lidocaine-prilocaine (EMLA) cream Apply topically as needed.  30 g  7  . LORazepam (ATIVAN) 0.5 MG tablet Take 1 tablet (0.5 mg total) by mouth every 6 (six) hours as needed (Nausea or vomiting).  30 tablet  0  . ondansetron (ZOFRAN) 8 MG tablet Take 1 tablet (8 mg total) by mouth 2 (two) times daily as needed. Take two times a day as needed for nausea or vomiting starting on the third day after chemotherapy.  30 tablet  1  . oxyCODONE (OXY IR/ROXICODONE) 5 MG immediate release tablet Take 1 tablet (5 mg total) by mouth every 6 (six) hours as needed for pain.  10 tablet  0  . prochlorperazine (COMPAZINE) 10 MG tablet Take 1 tablet (10 mg total) by mouth every 6 (six) hours as needed (Nausea or vomiting).  30 tablet  1   No current facility-administered medications for this visit.    SURGICAL HISTORY:  Past Surgical History  Procedure Laterality Date  . Dilation and curettage of uterus  1993  . Cervical ablation  1983  . Breast biopsy Right 08/06/12  . Popliteal synovial cyst excision  1970  . Portacath placement Left 09/12/2012    Procedure: INSERTION  PORT-A-CATH;  Surgeon: Emelia Loron, MD;  Location: [facility] OR;  Service: General;  Laterality: Left;    REVIEW OF SYSTEMS:  Pertinent items are noted in HPI.   HEALTH MAINTENANCE:   PHYSICAL EXAMINATION: Blood pressure 124/81, pulse 76, temperature 98.1 F (36.7 C), temperature source Oral, resp. rate 20, height 5\' 7"  (1.702 m), weight 187 lb 3.2 oz (84.913 kg), last menstrual period 03/12/2012. Body mass index is 29.31 kg/(m^2). ECOG PERFORMANCE STATUS: 1 - Symptomatic but completely ambulatory  None performed  LABORATORY DATA: Lab Results  Component Value Date   WBC 1.4* 09/20/2012   HGB 12.5 09/20/2012   HCT 36.7 09/20/2012   MCV 88.5 09/20/2012   PLT 176 09/20/2012      Chemistry      Component Value Date/Time   NA 140 09/13/2012 1219   K 3.8 09/13/2012 1219   CO2 27 09/13/2012 1219   BUN  10.2 09/13/2012 1219   CREATININE 0.8 09/13/2012 1219      Component Value Date/Time   CALCIUM 9.6 09/13/2012 1219   ALKPHOS 54 09/13/2012 1219   AST 20 09/13/2012 1219   ALT 25 09/13/2012 1219   BILITOT 0.57 09/13/2012 1219       RADIOGRAPHIC STUDIES:  Ct Chest W Contrast  08/29/2012   *RADIOLOGY REPORT*  Clinical Data:  Breast cancer.  CT CHEST, ABDOMEN AND PELVIS WITH CONTRAST  Technique:  Multidetector CT imaging of the chest, abdomen and pelvis was performed following the standard protocol during bolus administration of intravenous contrast.  Contrast: OMNIPAQUE IOHEXOL 300 MG/ML  SOLN  Comparison:  PET 08/29/2012.  CT CHEST  Findings:  No pathologically enlarged mediastinal, hilar, axillary or internal mammary lymph nodes.  Primary right breast mass measures 3.1 x 3.4 cm.  There is a smaller nodule along the superior medial margin of the primary mass, measuring 1.2 x 1.3 cm. Heart size normal.  No pericardial effusion.  Minimal biapical pleural parenchymal scarring.  An oblong 4 mm (2 x 6 mm) nodule along the left major fissure is most consistent with a subpleural lymph node.  Lungs  are otherwise clear.  No pleural fluid.  Airway is unremarkable.  IMPRESSION: Right breast lesions, consistent with the given history of breast cancer, without evidence of metastatic disease.  CT ABDOMEN AND PELVIS  Findings:  Liver, gallbladder, adrenal glands, kidneys, spleen, pancreas, stomach and small bowel are unremarkable.  Appendix is normal.  A fair amount of stool is seen in the colon, indicative of constipation.  No pathologically enlarged lymph nodes.  Uterus and ovaries are visualized.  No free fluid.  No worrisome lytic or sclerotic lesions.  IMPRESSION: No evidence of metastatic disease in the abdomen or pelvis.   Original Report Authenticated By: Leanna Battles, M.D.   Ct Abdomen Pelvis W Contrast  08/29/2012   *RADIOLOGY REPORT*  Clinical Data:  Breast cancer.  CT CHEST, ABDOMEN AND PELVIS WITH CONTRAST  Technique:  Multidetector CT imaging of the chest, abdomen and pelvis was performed following the standard protocol during bolus administration of intravenous contrast.  Contrast: OMNIPAQUE IOHEXOL 300 MG/ML  SOLN  Comparison:  PET 08/29/2012.  CT CHEST  Findings:  No pathologically enlarged mediastinal, hilar, axillary or internal mammary lymph nodes.  Primary right breast mass measures 3.1 x 3.4 cm.  There is a smaller nodule along the superior medial margin of the primary mass, measuring 1.2 x 1.3 cm. Heart size normal.  No pericardial effusion.  Minimal biapical pleural parenchymal scarring.  An oblong 4 mm (2 x 6 mm) nodule along the left major fissure is most consistent with a subpleural lymph node.  Lungs are otherwise clear.  No pleural fluid.  Airway is unremarkable.  IMPRESSION: Right breast lesions, consistent with the given history of breast cancer, without evidence of metastatic disease.  CT ABDOMEN AND PELVIS  Findings:  Liver, gallbladder, adrenal glands, kidneys, spleen, pancreas, stomach and small bowel are unremarkable.  Appendix is normal.  A fair amount of stool is seen  in the colon, indicative of constipation.  No pathologically enlarged lymph nodes.  Uterus and ovaries are visualized.  No free fluid.  No worrisome lytic or sclerotic lesions.  IMPRESSION: No evidence of metastatic disease in the abdomen or pelvis.   Original Report Authenticated By: Leanna Battles, M.D.   Mr Breast Bilateral W Wo Contrast  08/27/2012   *RADIOLOGY REPORT*  Clinical Data: Newly-diagnosed right breast invasive  ductal carcinoma manifesting as a palpable mass.  BILATERAL BREAST MRI WITH AND WITHOUT CONTRAST  Technique: Multiplanar, multisequence MR images of both breasts were obtained prior to and following the intravenous administration of 16ml of Multihance.  Three dimensional images were evaluated at the independent DynaCad workstation.  Comparison:  [place] mammograms 2014 and 2012.  We are not provided with post biopsy mammograms to indicate whether a clip was placed.  Findings: Background parenchymal enhancement pattern is mild. Breast parenchymal pattern suggests heterogeneously dense parenchyma.  In the right breast upper outer quadrant is an irregular lobulated mass with central T2 hyperintensity suggesting necrosis or biopsy artifact, with a satellite nodule or lobulation within 2 mm at its superior aspect, measured together as 4.5 x 4.0 x 3.8 cm.  This demonstrates washin/washout type enhancement kinetics.  This corresponds to the reported biopsy-proven breast cancer.  Internal signal voids are noted which may indicate gas, clip artifact, or less likely calcification.  There is extension of enhancement to the nipple, suggesting nipple involvement may be present.  No other area of abnormal enhancement is seen in either breast.  No lymphadenopathy or T2-weighted hyperintensity elsewhere in either breast.  IMPRESSION: Dominant right breast upper outer quadrant irregular mass corresponding to the biopsy-proven breast cancer, measuring 4.5 cm in total and including the satellite nodule or  lobulation at its superior aspect, which is within 2 mm of the dominant mass.  It is not clear from the provided images whether a clip was placed at prior biopsy.  If the patient is to undergo neoadjuvant chemotherapy, placement of a clip is highly recommended, because the mass could become occult on imaging if there is a favorable treatment response, and therefore may not be accurately localized if lumpectomy is planned.  Extension of enhancement from the mass to the right nipple may suggest nipple involvement.  No evidence for contralateral left-sided abnormal enhancement.  RECOMMENDATION: Treatment plan  THREE-DIMENSIONAL MR IMAGE RENDERING ON INDEPENDENT WORKSTATION:  Three-dimensional MR images were rendered by post-processing of the original MR data on an independent workstation.  The three- dimensional MR images were interpreted, and findings were reported in the accompanying complete MRI report for this study.  BI-RADS CATEGORY 6:  Known biopsy-proven malignancy - appropriate action should be taken.   Original Report Authenticated By: Christiana Pellant, M.D.   Nm Pet Image Initial (pi) Skull Base To Thigh  08/29/2012   *RADIOLOGY REPORT*  Clinical Data: Initial treatment strategy for breast cancer.  NUCLEAR MEDICINE PET SKULL BASE TO THIGH  Fasting Blood Glucose:  88  Technique:  17.6 mCi F-18 FDG was injected intravenously. CT data was obtained and used for attenuation correction and anatomic localization only.  (This was not acquired as a diagnostic CT examination.) Additional exam technical data entered on technologist worksheet.  Comparison:  CT chest abdomen pelvis 08/29/2012.  Findings:  Neck: No hypermetabolic lymph nodes in the neck.  Hypermetabolic brown fat is noted.  CT images show no acute findings.  Chest:  No hypermetabolic mediastinal or hilar lymph nodes.  No suspicious pulmonary nodules.  There is hypermetabolic brown fat.  Primary right breast mass measures 3.2 x 3.3 cm with an S U V max of  16.0.  There is an adjacent nodule along the superomedial border of the primary mass, measuring 1.4 cm, also hypermetabolic.  No additional areas of abnormal hypermetabolism in the chest.  CT images show no acute findings.  Specifically, no pericardial or pleural effusion.  Abdomen/Pelvis:  No abnormal hypermetabolic activity  within the liver, pancreas, adrenal glands or spleen.  No hypermetabolic lymph nodes.  CT images show no acute findings.  Skeleton:  No focal hypermetabolic activity to suggest skeletal metastasis.  IMPRESSION: Hypermetabolic right breast lesions, consistent with the given history breast cancer, without evidence of metastatic disease.   Original Report Authenticated By: Leanna Battles, M.D.   Dg Chest Port 1 View  09/12/2012   *RADIOLOGY REPORT*  Clinical Data: Port-A-Cath placement  PORTABLE CHEST - 1 VIEW  Comparison: CT chest 08/29/2012  Findings: Left subclavian Port-A-Cath tip in the mid SVC.  No pneumothorax  The lungs are clear.  IMPRESSION: Satisfactory Port-A-Cath placement.   Original Report Authenticated By: Janeece Riggers, M.D.   Dg Fluoro Guide Cv Line-no Report  09/12/2012   CLINICAL DATA: right breast mass   FLOURO GUIDE CV LINE  Fluoroscopy was utilized by the requesting physician.  No radiographic  interpretation.     ASSESSMENT: 47 year old female with  #1 T2 NX MX invasive ductal carcinoma of the breast found a self breast examination of the right breast. She is a good candidate for neoadjuvant chemotherapy. We discussed treatment with Adriamycin Cytoxan followed by Taxol. She will receive dose dense a.c. for 4 cycles followed by Taxol weekly for 12 weeks. She is scheduled to have a Port-A-Cath placed next week by Dr. Emelia Loron. She desires to begin treatment as soon as possible.  #2 we discussed her CT scan results and PET scan results as well as her MRI results today.   PLAN:   #1 proceed with Port-A-Cath placement.  #2 she will return after port  placement to begin her chemotherapy.   All questions were answered. The patient knows to call the clinic with any problems, questions or concerns. We can certainly see the patient much sooner if necessary.  I spent 25 minutes counseling the patient face to face. The total time spent in the appointment was 30 minutes.    Drue Second, MD Medical/Oncology [facility] [phone] (beeper) [phone] (Office)

## 2012-09-25 ENCOUNTER — Telehealth: Payer: Self-pay | Admitting: *Deleted

## 2012-09-25 ENCOUNTER — Telehealth: Payer: Self-pay | Admitting: Oncology

## 2012-09-25 NOTE — Telephone Encounter (Signed)
, °

## 2012-09-25 NOTE — Telephone Encounter (Signed)
Per staff message I have adjusted 8/1 app.  JMW

## 2012-09-26 ENCOUNTER — Other Ambulatory Visit: Payer: Self-pay | Admitting: Emergency Medicine

## 2012-09-27 ENCOUNTER — Telehealth: Payer: Self-pay | Admitting: Oncology

## 2012-09-27 ENCOUNTER — Other Ambulatory Visit: Payer: BC Managed Care – PPO | Admitting: Lab

## 2012-09-27 ENCOUNTER — Encounter: Payer: Self-pay | Admitting: Oncology

## 2012-09-27 ENCOUNTER — Ambulatory Visit (HOSPITAL_BASED_OUTPATIENT_CLINIC_OR_DEPARTMENT_OTHER): Payer: BC Managed Care – PPO

## 2012-09-27 ENCOUNTER — Ambulatory Visit (HOSPITAL_BASED_OUTPATIENT_CLINIC_OR_DEPARTMENT_OTHER): Payer: BC Managed Care – PPO | Admitting: Oncology

## 2012-09-27 ENCOUNTER — Other Ambulatory Visit (HOSPITAL_BASED_OUTPATIENT_CLINIC_OR_DEPARTMENT_OTHER): Payer: BC Managed Care – PPO | Admitting: Lab

## 2012-09-27 VITALS — BP 122/81 | HR 84 | Temp 98.5°F | Resp 20 | Ht 67.0 in | Wt 190.7 lb

## 2012-09-27 DIAGNOSIS — C50419 Malignant neoplasm of upper-outer quadrant of unspecified female breast: Secondary | ICD-10-CM

## 2012-09-27 DIAGNOSIS — C50911 Malignant neoplasm of unspecified site of right female breast: Secondary | ICD-10-CM

## 2012-09-27 DIAGNOSIS — C50919 Malignant neoplasm of unspecified site of unspecified female breast: Secondary | ICD-10-CM

## 2012-09-27 DIAGNOSIS — Z5111 Encounter for antineoplastic chemotherapy: Secondary | ICD-10-CM

## 2012-09-27 DIAGNOSIS — F411 Generalized anxiety disorder: Secondary | ICD-10-CM

## 2012-09-27 LAB — COMPREHENSIVE METABOLIC PANEL (CC13)
ALT: 18 U/L (ref 0–55)
AST: 15 U/L (ref 5–34)
Albumin: 3.9 g/dL (ref 3.5–5.0)
Alkaline Phosphatase: 64 U/L (ref 40–150)
BUN: 9.2 mg/dL (ref 7.0–26.0)
CO2: 26 meq/L (ref 22–29)
Calcium: 9.5 mg/dL (ref 8.4–10.4)
Chloride: 106 meq/L (ref 98–109)
Creatinine: 0.7 mg/dL (ref 0.6–1.1)
Glucose: 97 mg/dL (ref 70–140)
Potassium: 4.4 meq/L (ref 3.5–5.1)
Sodium: 140 meq/L (ref 136–145)
Total Bilirubin: <0.2 mg/dL (ref 0.20–1.20)
Total Protein: 7.2 g/dL (ref 6.4–8.3)

## 2012-09-27 LAB — CBC WITH DIFFERENTIAL/PLATELET
Basophils Absolute: 0.2 10*3/uL — ABNORMAL HIGH (ref 0.0–0.1)
EOS%: 0.9 % (ref 0.0–7.0)
Eosinophils Absolute: 0.1 10*3/uL (ref 0.0–0.5)
HCT: 39.8 % (ref 34.8–46.6)
HGB: 13 g/dL (ref 11.6–15.9)
LYMPH%: 12.6 % — ABNORMAL LOW (ref 14.0–49.7)
MCH: 29.4 pg (ref 25.1–34.0)
MCV: 90 fL (ref 79.5–101.0)
MONO%: 6.4 % (ref 0.0–14.0)
NEUT#: 8.2 10*3/uL — ABNORMAL HIGH (ref 1.5–6.5)
NEUT%: 77.9 % — ABNORMAL HIGH (ref 38.4–76.8)
Platelets: 248 10*3/uL (ref 145–400)
RDW: 12.8 % (ref 11.2–14.5)

## 2012-09-27 MED ORDER — SODIUM CHLORIDE 0.9 % IJ SOLN
10.0000 mL | INTRAMUSCULAR | Status: DC | PRN
  Administered 2012-09-27: 10 mL
  Filled 2012-09-27: qty 10

## 2012-09-27 MED ORDER — DEXAMETHASONE SODIUM PHOSPHATE 20 MG/5ML IJ SOLN
12.0000 mg | Freq: Once | INTRAMUSCULAR | Status: AC
  Administered 2012-09-27: 12 mg via INTRAVENOUS

## 2012-09-27 MED ORDER — DOXORUBICIN HCL CHEMO IV INJECTION 2 MG/ML
60.0000 mg/m2 | Freq: Once | INTRAVENOUS | Status: AC
  Administered 2012-09-27: 120 mg via INTRAVENOUS
  Filled 2012-09-27: qty 60

## 2012-09-27 MED ORDER — HEPARIN SOD (PORK) LOCK FLUSH 100 UNIT/ML IV SOLN
500.0000 [IU] | Freq: Once | INTRAVENOUS | Status: AC | PRN
  Administered 2012-09-27: 500 [IU]
  Filled 2012-09-27: qty 5

## 2012-09-27 MED ORDER — PALONOSETRON HCL INJECTION 0.25 MG/5ML
0.2500 mg | Freq: Once | INTRAVENOUS | Status: AC
  Administered 2012-09-27: 0.25 mg via INTRAVENOUS

## 2012-09-27 MED ORDER — SODIUM CHLORIDE 0.9 % IV SOLN
Freq: Once | INTRAVENOUS | Status: AC
  Administered 2012-09-27: 10:00:00 via INTRAVENOUS

## 2012-09-27 MED ORDER — SODIUM CHLORIDE 0.9 % IV SOLN
150.0000 mg | Freq: Once | INTRAVENOUS | Status: AC
  Administered 2012-09-27: 150 mg via INTRAVENOUS
  Filled 2012-09-27: qty 5

## 2012-09-27 MED ORDER — SODIUM CHLORIDE 0.9 % IV SOLN
600.0000 mg/m2 | Freq: Once | INTRAVENOUS | Status: AC
  Administered 2012-09-27: 1200 mg via INTRAVENOUS
  Filled 2012-09-27: qty 60

## 2012-09-27 NOTE — Progress Notes (Deleted)
OFFICE PROGRESS NOTE  CC  Elizabeth Miles,Elizabeth C, MD [address] [place] [address] Dr. Reuel Boom Pomposini  Dr. Emelia Loron  Dr. Lurline Hare  DIAGNOSIS: 47 year old female with new diagnosis of stage II right breast cancer  STAGE:  Right breast  Stage II,T2NxMx  ER+PR-Her2Neu-  Ki-67 high   PRIOR THERAPY:  #1June 2013 had a mammogram that was normal. But there was on physical exam possibility of a cyst noted in the right breast. The mammogram was negative. In 2014 June patient noted on exam another lump in the right breast. She underwent a diagnostic mammogram on June 10 that showed a right breast nodule in the outer quadrant. She had an ultrasound performed that showed at the 9:30 o'clock position a 3.6 cm area and then added 10:00 position 1.3 cm area with a total area being anywhere between 5-6 cm. The patient went on to have a right breast biopsy performed in [place]. The pathology revealed an invasive ductal carcinoma. This is been confirmed by our pathology as well. The carcinoma and papillary features and was felt to be between a grade 1 and 2. The tumor was estrogen receptor positive strongly (100%) progesterone receptor negative HER-2/neu negative with a Ki-67 that showed a high proliferation rate.  #2 patient was seen by me on 08/22/2012 to discuss treatment options. Due to the size of the tumor I had recommended that patient proceed with neoadjuvant treatment initially consisting of chemotherapy. However due to the workup being incomplete I did recommend that she undergo MRI of the breasts as well as staging studies. When she has had. MRI of the breasts performed on 08/27/2012 revealed in the right upper quadrant irregular lobulated mass with a satellite nodule in ambulation within 2 mm at its superior aspect measured together as 4.5 x 4.0 x 3.8 cm. No lymphadenopathy was noted there was no any other area of abnormal enhancement in either breast. Patient also had PET scans  performed on 73 that revealed the primary breast cancer measuring 3.2 x 3.3 cm with SUV of 16. It was adjacent nodule along his pure medial border of the primary mass measuring 1.4 cm which was also hypermetabolic. There were no additional areas of abnormal hypermetabolism In the chest. Abdomen and pelvis showed no abnormal hypermetabolic activity within the liver pancreas adrenal glands or spleen. No hypermetabolic lymph nodes. In the skeleton no focal hypermetabolic activity to suggest skeletal metastasis.  #3 patient was also seen by Dr. Emelia Loron as well as Dr. Lurline Hare in consultation. Dr. Dwain Sarna has agreed to place a port for eventual chemotherapy. Patient also has had chemotherapy teaching class. The port will be placed on 09/12/2012.  CURRENT THERAPY:Patient will proceed with neoadjuvant chemotherapy  INTERVAL HISTORY: Elizabeth Miles 47 y.o. female returns for followup visit today to discuss her scans. Overall she's doing well. She has also been seen by Dr. Lurline Hare. She was pleased with the appointment. Patient has an appointment set up to be seen by Dr. Emelia Loron on 09/10/2012. Clinically patient seems to still be struggling with the diagnosis and very anxious and nervous about situation. She is having significant problems with gastroesophageal reflux disease as well. I've given her refill prescription. She occasionally does get nausea. Remainder of the 10 point review of systems is negative.  MEDICAL HISTORY: Past Medical History  Diagnosis Date  . 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 GERD (gastroesophageal reflux disease)   . GERD (gastroesophageal reflux disease) 08/22/2012  .  Allergy   . Complication of anesthesia     1986 ; problem waking up  . Anxiety     ALLERGIES:  is allergic to darvocet; shellfish allergy; and other.  MEDICATIONS:  Current Outpatient Prescriptions  Medication Sig Dispense Refill  . cetirizine (ZYRTEC) 10  MG tablet Take 10 mg by mouth daily.      . ciprofloxacin (CIPRO) 500 MG tablet Take 1 tablet (500 mg total) by mouth 2 (two) times daily.  14 tablet  0  . dexamethasone (DECADRON) 4 MG tablet Take 2 tablets by mouth once a day on the day after chemotherapy and then take 2 tablets two times a day for 2 days. Take with food.  30 tablet  1  . Dexlansoprazole (DEXILANT) 30 MG capsule Take 1 capsule (30 mg total) by mouth daily.  30 capsule  7  . lidocaine-prilocaine (EMLA) cream Apply topically as needed.  30 g  7  . loratadine (CLARITIN) 10 MG tablet Take 10 mg by mouth as needed for allergies.      Marland Kitchen acetaminophen (TYLENOL) 325 MG tablet Take 650 mg by mouth every 4 (four) hours as needed for pain.      . hyaluronate sodium (RADIAPLEXRX) GEL Apply 1 application topically once. Apply after rad txs and bedtime,prn      . ibuprofen (ADVIL,MOTRIN) 200 MG tablet Take 200 mg by mouth every 4 (four) hours as needed for pain.      Marland Kitchen LORazepam (ATIVAN) 0.5 MG tablet Take 1 tablet (0.5 mg total) by mouth every 6 (six) hours as needed (Nausea or vomiting).  30 tablet  0  . ondansetron (ZOFRAN) 8 MG tablet Take 1 tablet (8 mg total) by mouth 2 (two) times daily as needed. Take two times a day as needed for nausea or vomiting starting on the third day after chemotherapy.  30 tablet  1  . oxyCODONE (OXY IR/ROXICODONE) 5 MG immediate release tablet Take 1 tablet (5 mg total) by mouth every 6 (six) hours as needed for pain.  10 tablet  0  . polyethylene glycol (MIRALAX / GLYCOLAX) packet Take 17 g by mouth as needed.      . prochlorperazine (COMPAZINE) 10 MG tablet Take 1 tablet (10 mg total) by mouth every 6 (six) hours as needed (Nausea or vomiting).  30 tablet  1   No current facility-administered medications for this visit.    SURGICAL HISTORY:  Past Surgical History  Procedure Laterality Date  . Dilation and curettage of uterus  1993  . Cervical ablation  1983  . Breast biopsy Right 08/06/12  . Popliteal  synovial cyst excision  1970  . Portacath placement Left 09/12/2012    Procedure: INSERTION PORT-A-CATH;  Surgeon: Emelia Loron, MD;  Location[facility] OR;  Service: General;  Laterality: Left;    REVIEW OF SYSTEMS:  Pertinent items are noted in HPI.   HEALTH MAINTENANCE:   PHYSICAL EXAMINATION: Blood pressure 122/81, pulse 84, temperature 98.5 Elizabeth (36.9 Miles), temperature source Oral, resp. rate 20, height 5\' 7"  (1.702 m), weight 190 lb 11.2 oz (86.501 kg), last menstrual period 03/12/2012. Body mass index is 29.86 kg/(m^2). ECOG PERFORMANCE STATUS: 1 - Symptomatic but completely ambulatory  None performed  LABORATORY DATA: Lab Results  Component Value Date   WBC 10.6* 09/27/2012   HGB 13.0 09/27/2012   HCT 39.8 09/27/2012   MCV 90.0 09/27/2012   PLT 248 09/27/2012      Chemistry      Component Value Date/Time  NA 140 09/13/2012 1219   K 3.8 09/13/2012 1219   CO2 27 09/13/2012 1219   BUN 10.2 09/13/2012 1219   CREATININE 0.8 09/13/2012 1219      Component Value Date/Time   CALCIUM 9.6 09/13/2012 1219   ALKPHOS 54 09/13/2012 1219   AST 20 09/13/2012 1219   ALT 25 09/13/2012 1219   BILITOT 0.57 09/13/2012 1219       RADIOGRAPHIC STUDIES:  Ct Chest W Contrast  08/29/2012   *RADIOLOGY REPORT*  Clinical Data:  Breast cancer.  CT CHEST, ABDOMEN AND PELVIS WITH CONTRAST  Technique:  Multidetector CT imaging of the chest, abdomen and pelvis was performed following the standard protocol during bolus administration of intravenous contrast.  Contrast: OMNIPAQUE IOHEXOL 300 MG/ML  SOLN  Comparison:  PET 08/29/2012.  CT CHEST  Findings:  No pathologically enlarged mediastinal, hilar, axillary or internal mammary lymph nodes.  Primary right breast mass measures 3.1 x 3.4 cm.  There is a smaller nodule along the superior medial margin of the primary mass, measuring 1.2 x 1.3 cm. Heart size normal.  No pericardial effusion.  Minimal biapical pleural parenchymal scarring.  An oblong 4 mm (2 x 6 mm)  nodule along the left major fissure is most consistent with a subpleural lymph node.  Lungs are otherwise clear.  No pleural fluid.  Airway is unremarkable.  IMPRESSION: Right breast lesions, consistent with the given history of breast cancer, without evidence of metastatic disease.  CT ABDOMEN AND PELVIS  Findings:  Liver, gallbladder, adrenal glands, kidneys, spleen, pancreas, stomach and small bowel are unremarkable.  Appendix is normal.  A fair amount of stool is seen in the colon, indicative of constipation.  No pathologically enlarged lymph nodes.  Uterus and ovaries are visualized.  No free fluid.  No worrisome lytic or sclerotic lesions.  IMPRESSION: No evidence of metastatic disease in the abdomen or pelvis.   Original Report Authenticated By: Leanna Battles, M.D.   Ct Abdomen Pelvis W Contrast  08/29/2012   *RADIOLOGY REPORT*  Clinical Data:  Breast cancer.  CT CHEST, ABDOMEN AND PELVIS WITH CONTRAST  Technique:  Multidetector CT imaging of the chest, abdomen and pelvis was performed following the standard protocol during bolus administration of intravenous contrast.  Contrast: OMNIPAQUE IOHEXOL 300 MG/ML  SOLN  Comparison:  PET 08/29/2012.  CT CHEST  Findings:  No pathologically enlarged mediastinal, hilar, axillary or internal mammary lymph nodes.  Primary right breast mass measures 3.1 x 3.4 cm.  There is a smaller nodule along the superior medial margin of the primary mass, measuring 1.2 x 1.3 cm. Heart size normal.  No pericardial effusion.  Minimal biapical pleural parenchymal scarring.  An oblong 4 mm (2 x 6 mm) nodule along the left major fissure is most consistent with a subpleural lymph node.  Lungs are otherwise clear.  No pleural fluid.  Airway is unremarkable.  IMPRESSION: Right breast lesions, consistent with the given history of breast cancer, without evidence of metastatic disease.  CT ABDOMEN AND PELVIS  Findings:  Liver, gallbladder, adrenal glands, kidneys, spleen, pancreas,  stomach and small bowel are unremarkable.  Appendix is normal.  A fair amount of stool is seen in the colon, indicative of constipation.  No pathologically enlarged lymph nodes.  Uterus and ovaries are visualized.  No free fluid.  No worrisome lytic or sclerotic lesions.  IMPRESSION: No evidence of metastatic disease in the abdomen or pelvis.   Original Report Authenticated By: Leanna Battles, M.D.  Mr Breast Bilateral W Wo Contrast  08/27/2012   *RADIOLOGY REPORT*  Clinical Data: Newly-diagnosed right breast invasive ductal carcinoma manifesting as a palpable mass.  BILATERAL BREAST MRI WITH AND WITHOUT CONTRAST  Technique: Multiplanar, multisequence MR images of both breasts were obtained prior to and following the intravenous administration of 16ml of Multihance.  Three dimensional images were evaluated at the independent DynaCad workstation.  Comparison:  [place] mammograms 2014 and 2012.  We are not provided with post biopsy mammograms to indicate whether a clip was placed.  Findings: Background parenchymal enhancement pattern is mild. Breast parenchymal pattern suggests heterogeneously dense parenchyma.  In the right breast upper outer quadrant is an irregular lobulated mass with central T2 hyperintensity suggesting necrosis or biopsy artifact, with a satellite nodule or lobulation within 2 mm at its superior aspect, measured together as 4.5 x 4.0 x 3.8 cm.  This demonstrates washin/washout type enhancement kinetics.  This corresponds to the reported biopsy-proven breast cancer.  Internal signal voids are noted which may indicate gas, clip artifact, or less likely calcification.  There is extension of enhancement to the nipple, suggesting nipple involvement may be present.  No other area of abnormal enhancement is seen in either breast.  No lymphadenopathy or T2-weighted hyperintensity elsewhere in either breast.  IMPRESSION: Dominant right breast upper outer quadrant irregular mass corresponding to the  biopsy-proven breast cancer, measuring 4.5 cm in total and including the satellite nodule or lobulation at its superior aspect, which is within 2 mm of the dominant mass.  It is not clear from the provided images whether a clip was placed at prior biopsy.  If the patient is to undergo neoadjuvant chemotherapy, placement of a clip is highly recommended, because the mass could become occult on imaging if there is a favorable treatment response, and therefore may not be accurately localized if lumpectomy is planned.  Extension of enhancement from the mass to the right nipple may suggest nipple involvement.  No evidence for contralateral left-sided abnormal enhancement.  RECOMMENDATION: Treatment plan  THREE-DIMENSIONAL MR IMAGE RENDERING ON INDEPENDENT WORKSTATION:  Three-dimensional MR images were rendered by post-processing of the original MR data on an independent workstation.  The three- dimensional MR images were interpreted, and findings were reported in the accompanying complete MRI report for this study.  BI-RADS CATEGORY 6:  Known biopsy-proven malignancy - appropriate action should be taken.   Original Report Authenticated By: Christiana Pellant, M.D.   Nm Pet Image Initial (pi) Skull Base To Thigh  08/29/2012   *RADIOLOGY REPORT*  Clinical Data: Initial treatment strategy for breast cancer.  NUCLEAR MEDICINE PET SKULL BASE TO THIGH  Fasting Blood Glucose:  88  Technique:  17.6 mCi Elizabeth-18 FDG was injected intravenously. CT data was obtained and used for attenuation correction and anatomic localization only.  (This was not acquired as a diagnostic CT examination.) Additional exam technical data entered on technologist worksheet.  Comparison:  CT chest abdomen pelvis 08/29/2012.  Findings:  Neck: No hypermetabolic lymph nodes in the neck.  Hypermetabolic brown fat is noted.  CT images show no acute findings.  Chest:  No hypermetabolic mediastinal or hilar lymph nodes.  No suspicious pulmonary nodules.  There is  hypermetabolic brown fat.  Primary right breast mass measures 3.2 x 3.3 cm with an S U V max of 16.0.  There is an adjacent nodule along the superomedial border of the primary mass, measuring 1.4 cm, also hypermetabolic.  No additional areas of abnormal hypermetabolism in the chest.  CT  images show no acute findings.  Specifically, no pericardial or pleural effusion.  Abdomen/Pelvis:  No abnormal hypermetabolic activity within the liver, pancreas, adrenal glands or spleen.  No hypermetabolic lymph nodes.  CT images show no acute findings.  Skeleton:  No focal hypermetabolic activity to suggest skeletal metastasis.  IMPRESSION: Hypermetabolic right breast lesions, consistent with the given history breast cancer, without evidence of metastatic disease.   Original Report Authenticated By: Leanna Battles, M.D.   Dg Chest Port 1 View  09/12/2012   *RADIOLOGY REPORT*  Clinical Data: Port-A-Cath placement  PORTABLE CHEST - 1 VIEW  Comparison: CT chest 08/29/2012  Findings: Left subclavian Port-A-Cath tip in the mid SVC.  No pneumothorax  The lungs are clear.  IMPRESSION: Satisfactory Port-A-Cath placement.   Original Report Authenticated By: Janeece Riggers, M.D.   Dg Fluoro Guide Cv Line-no Report  09/12/2012   CLINICAL DATA: right breast mass   FLOURO GUIDE CV LINE  Fluoroscopy was utilized by the requesting physician.  No radiographic  interpretation.     ASSESSMENT: 47 year old female with  #1 T2 NX MX invasive ductal carcinoma of the breast found a self breast examination of the right breast. She is a good candidate for neoadjuvant chemotherapy. We discussed treatment with Adriamycin Cytoxan followed by Taxol. She will receive dose dense a.Miles. for 4 cycles followed by Taxol weekly for 12 weeks. She is scheduled to have a Port-A-Cath placed next week by Dr. Emelia Loron. She desires to begin treatment as soon as possible.  #2 we discussed her CT scan results and PET scan results as well as her MRI results  today.   PLAN:    All questions were answered. The patient knows to call the clinic with any problems, questions or concerns. We can certainly see the patient much sooner if necessary.  I spent 25 minutes counseling the patient face to face. The total time spent in the appointment was 30 minutes.    Drue Second, MD Medical/Oncology [facility], [place] [phone] (beeper) [phone] (Office)

## 2012-09-27 NOTE — Patient Instructions (Addendum)
Proceed with cycle 2 of AC of chemotherapy today  I will see you back in 1 week

## 2012-09-28 ENCOUNTER — Ambulatory Visit (HOSPITAL_BASED_OUTPATIENT_CLINIC_OR_DEPARTMENT_OTHER): Payer: BC Managed Care – PPO

## 2012-09-28 VITALS — BP 113/78 | HR 80 | Temp 98.1°F

## 2012-09-28 DIAGNOSIS — Z5189 Encounter for other specified aftercare: Secondary | ICD-10-CM

## 2012-09-28 DIAGNOSIS — C50911 Malignant neoplasm of unspecified site of right female breast: Secondary | ICD-10-CM

## 2012-09-28 DIAGNOSIS — C50919 Malignant neoplasm of unspecified site of unspecified female breast: Secondary | ICD-10-CM

## 2012-09-28 MED ORDER — PEGFILGRASTIM INJECTION 6 MG/0.6ML
6.0000 mg | Freq: Once | SUBCUTANEOUS | Status: AC
  Administered 2012-09-28: 6 mg via SUBCUTANEOUS

## 2012-09-28 NOTE — Patient Instructions (Addendum)

## 2012-10-04 ENCOUNTER — Telehealth: Payer: Self-pay | Admitting: *Deleted

## 2012-10-04 ENCOUNTER — Encounter: Payer: Self-pay | Admitting: Oncology

## 2012-10-04 ENCOUNTER — Other Ambulatory Visit (HOSPITAL_BASED_OUTPATIENT_CLINIC_OR_DEPARTMENT_OTHER): Payer: BC Managed Care – PPO

## 2012-10-04 ENCOUNTER — Ambulatory Visit (HOSPITAL_BASED_OUTPATIENT_CLINIC_OR_DEPARTMENT_OTHER): Payer: BC Managed Care – PPO | Admitting: Oncology

## 2012-10-04 VITALS — BP 118/79 | HR 96 | Temp 97.7°F | Resp 18 | Ht 67.0 in | Wt 189.5 lb

## 2012-10-04 DIAGNOSIS — C50919 Malignant neoplasm of unspecified site of unspecified female breast: Secondary | ICD-10-CM

## 2012-10-04 DIAGNOSIS — C50419 Malignant neoplasm of upper-outer quadrant of unspecified female breast: Secondary | ICD-10-CM

## 2012-10-04 DIAGNOSIS — C50911 Malignant neoplasm of unspecified site of right female breast: Secondary | ICD-10-CM

## 2012-10-04 DIAGNOSIS — D709 Neutropenia, unspecified: Secondary | ICD-10-CM

## 2012-10-04 LAB — COMPREHENSIVE METABOLIC PANEL (CC13)
ALT: 11 U/L (ref 0–55)
AST: 10 U/L (ref 5–34)
Alkaline Phosphatase: 85 U/L (ref 40–150)
Sodium: 141 mEq/L (ref 136–145)
Total Bilirubin: 0.45 mg/dL (ref 0.20–1.20)
Total Protein: 6.6 g/dL (ref 6.4–8.3)

## 2012-10-04 LAB — CBC WITH DIFFERENTIAL/PLATELET
BASO%: 4.6 % — ABNORMAL HIGH (ref 0.0–2.0)
EOS%: 0.8 % (ref 0.0–7.0)
Eosinophils Absolute: 0 10*3/uL (ref 0.0–0.5)
MCH: 29.4 pg (ref 25.1–34.0)
MCHC: 33.2 g/dL (ref 31.5–36.0)
MCV: 88.6 fL (ref 79.5–101.0)
MONO%: 9.2 % (ref 0.0–14.0)
NEUT#: 0.7 10*3/uL — ABNORMAL LOW (ref 1.5–6.5)
RBC: 3.77 10*6/uL (ref 3.70–5.45)
RDW: 12.8 % (ref 11.2–14.5)
nRBC: 0 % (ref 0–0)

## 2012-10-04 MED ORDER — CIPROFLOXACIN HCL 500 MG PO TABS: 500.0000 mg | ORAL_TABLET | Freq: Two times a day (BID) | ORAL | Status: DC

## 2012-10-04 NOTE — Progress Notes (Signed)
OFFICE PROGRESS NOTE  CC  [name],[name], MD [address] [place] [address] Dr. Reuel Boom Pomposini  Dr. Emelia Loron  Dr. Lurline Hare  DIAGNOSIS: 48 year old female with new diagnosis of stage II right breast cancer  STAGE:  Right breast  Stage II,T2NxMx  ER+PR-Her2Neu-  Ki-67 high   PRIOR THERAPY:  #1June 2013 had a mammogram that was normal. But there was on physical exam possibility of a cyst noted in the right breast. The mammogram was negative. In 2014 June patient noted on exam another lump in the right breast. She underwent a diagnostic mammogram on June 10 that showed a right breast nodule in the outer quadrant. She had an ultrasound performed that showed at the 9:30 o'clock position a 3.6 cm area and then added 10:00 position 1.3 cm area with a total area being anywhere between 5-6 cm. The patient went on to have a right breast biopsy performed in [place]. The pathology revealed an invasive ductal carcinoma. This is been confirmed by our pathology as well. The carcinoma and papillary features and was felt to be between a grade 1 and 2. The tumor was estrogen receptor positive strongly (100%) progesterone receptor negative HER-2/neu negative with a Ki-67 that showed a high proliferation rate.  #2 patient was seen by me on 08/22/2012 to discuss treatment options. Due to the size of the tumor I had recommended that patient proceed with neoadjuvant treatment initially consisting of chemotherapy. However due to the workup being incomplete I did recommend that she undergo MRI of the breasts as well as staging studies. When she has had. MRI of the breasts performed on 08/27/2012 revealed in the right upper quadrant irregular lobulated mass with a satellite nodule in ambulation within 2 mm at its superior aspect measured together as 4.5 x 4.0 x 3.8 cm. No lymphadenopathy was noted there was no any other area of abnormal enhancement in either breast. Patient also had PET scans  performed on 73 that revealed the primary breast cancer measuring 3.2 x 3.3 cm with SUV of 16. It was adjacent nodule along his pure medial border of the primary mass measuring 1.4 cm which was also hypermetabolic. There were no additional areas of abnormal hypermetabolism In the chest. Abdomen and pelvis showed no abnormal hypermetabolic activity within the liver pancreas adrenal glands or spleen. No hypermetabolic lymph nodes. In the skeleton no focal hypermetabolic activity to suggest skeletal metastasis.  #3 patient was also seen by Dr. Emelia Loron as well as Dr. Lurline Hare in consultation. Dr. Dwain Sarna has agreed to place a port for eventual chemotherapy. Patient also has had chemotherapy teaching class. The port will be placed on 09/12/2012.  #4. Began Neoadjuvant AC q 2 weeks on 7/18  CURRENT THERAPY:s/p cycle 2 of AC  INTERVAL HISTORY: Elizabeth Miles 47 y.o. female returns for followup visit today. Overall she's doing well.   MEDICAL HISTORY: Past Medical History  Diagnosis Date  . 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 GERD (gastroesophageal reflux disease)   . GERD (gastroesophageal reflux disease) 08/22/2012  . Allergy   . Complication of anesthesia     1986 ; problem waking up  . Anxiety     ALLERGIES:  is allergic to darvocet; shellfish allergy; and other.  MEDICATIONS:  Current Outpatient Prescriptions  Medication Sig Dispense Refill  . acetaminophen (TYLENOL) 325 MG tablet Take 650 mg by mouth every 4 (four) hours as needed for pain.      Marland Kitchen  Dexlansoprazole (DEXILANT) 30 MG capsule Take 1 capsule (30 mg total) by mouth daily.  30 capsule  7  . hyaluronate sodium (RADIAPLEXRX) GEL Apply 1 application topically once. Apply after rad txs and bedtime,prn      . lidocaine-prilocaine (EMLA) cream Apply topically as needed.  30 g  7  . loratadine (CLARITIN) 10 MG tablet Take 10 mg by mouth as needed for allergies.      Marland Kitchen ondansetron (ZOFRAN) 8 MG  tablet Take 1 tablet (8 mg total) by mouth 2 (two) times daily as needed. Take two times a day as needed for nausea or vomiting starting on the third day after chemotherapy.  30 tablet  1  . ciprofloxacin (CIPRO) 500 MG tablet Take 1 tablet (500 mg total) by mouth 2 (two) times daily.  14 tablet  0  . dexamethasone (DECADRON) 4 MG tablet Take 2 tablets by mouth once a day on the day after chemotherapy and then take 2 tablets two times a day for 2 days. Take with food.  30 tablet  1  . ibuprofen (ADVIL,MOTRIN) 200 MG tablet Take 200 mg by mouth every 4 (four) hours as needed for pain.      Marland Kitchen prochlorperazine (COMPAZINE) 10 MG tablet Take 1 tablet (10 mg total) by mouth every 6 (six) hours as needed (Nausea or vomiting).  30 tablet  1   No current facility-administered medications for this visit.    SURGICAL HISTORY:  Past Surgical History  Procedure Laterality Date  . Dilation and curettage of uterus  1993  . Cervical ablation  1983  . Breast biopsy Right 08/06/12  . Popliteal synovial cyst excision  1970  . Portacath placement Left 09/12/2012    Procedure: INSERTION PORT-A-CATH;  Surgeon: Emelia Loron, MD;  Location: [facility] OR;  Service: General;  Laterality: Left;    REVIEW OF SYSTEMS:  Pertinent items are noted in HPI.   HEALTH MAINTENANCE:   PHYSICAL EXAMINATION: Blood pressure 118/79, pulse 96, temperature 97.7 F (36.5 C), temperature source Oral, resp. rate 18, height 5\' 7"  (1.702 m), weight 189 lb 8 oz (85.957 kg). Body mass index is 29.67 kg/(m^2). ECOG PERFORMANCE STATUS: 1 - Symptomatic but completely ambulatory  None performed  LABORATORY DATA: Lab Results  Component Value Date   WBC 1.3* 10/04/2012   HGB 11.1* 10/04/2012   HCT 33.4* 10/04/2012   MCV 88.6 10/04/2012   PLT 240 10/04/2012      Chemistry      Component Value Date/Time   NA 140 09/27/2012 0829   K 4.4 09/27/2012 0829   CO2 26 09/27/2012 0829   BUN 9.2 09/27/2012 0829   CREATININE 0.7 09/27/2012 0829       Component Value Date/Time   CALCIUM 9.5 09/27/2012 0829   ALKPHOS 64 09/27/2012 0829   AST 15 09/27/2012 0829   ALT 18 09/27/2012 0829   BILITOT <0.20 09/27/2012 0829       RADIOGRAPHIC STUDIES:  Ct Chest W Contrast  08/29/2012   *RADIOLOGY REPORT*  Clinical Data:  Breast cancer.  CT CHEST, ABDOMEN AND PELVIS WITH CONTRAST  Technique:  Multidetector CT imaging of the chest, abdomen and pelvis was performed following the standard protocol during bolus administration of intravenous contrast.  Contrast: OMNIPAQUE IOHEXOL 300 MG/ML  SOLN  Comparison:  PET 08/29/2012.  CT CHEST  Findings:  No pathologically enlarged mediastinal, hilar, axillary or internal mammary lymph nodes.  Primary right breast mass measures 3.1 x 3.4 cm.  There is a  smaller nodule along the superior medial margin of the primary mass, measuring 1.2 x 1.3 cm. Heart size normal.  No pericardial effusion.  Minimal biapical pleural parenchymal scarring.  An oblong 4 mm (2 x 6 mm) nodule along the left major fissure is most consistent with a subpleural lymph node.  Lungs are otherwise clear.  No pleural fluid.  Airway is unremarkable.  IMPRESSION: Right breast lesions, consistent with the given history of breast cancer, without evidence of metastatic disease.  CT ABDOMEN AND PELVIS  Findings:  Liver, gallbladder, adrenal glands, kidneys, spleen, pancreas, stomach and small bowel are unremarkable.  Appendix is normal.  A fair amount of stool is seen in the colon, indicative of constipation.  No pathologically enlarged lymph nodes.  Uterus and ovaries are visualized.  No free fluid.  No worrisome lytic or sclerotic lesions.  IMPRESSION: No evidence of metastatic disease in the abdomen or pelvis.   Original Report Authenticated By: Leanna Battles, M.D.   Ct Abdomen Pelvis W Contrast  08/29/2012   *RADIOLOGY REPORT*  Clinical Data:  Breast cancer.  CT CHEST, ABDOMEN AND PELVIS WITH CONTRAST  Technique:  Multidetector CT imaging of the chest,  abdomen and pelvis was performed following the standard protocol during bolus administration of intravenous contrast.  Contrast: OMNIPAQUE IOHEXOL 300 MG/ML  SOLN  Comparison:  PET 08/29/2012.  CT CHEST  Findings:  No pathologically enlarged mediastinal, hilar, axillary or internal mammary lymph nodes.  Primary right breast mass measures 3.1 x 3.4 cm.  There is a smaller nodule along the superior medial margin of the primary mass, measuring 1.2 x 1.3 cm. Heart size normal.  No pericardial effusion.  Minimal biapical pleural parenchymal scarring.  An oblong 4 mm (2 x 6 mm) nodule along the left major fissure is most consistent with a subpleural lymph node.  Lungs are otherwise clear.  No pleural fluid.  Airway is unremarkable.  IMPRESSION: Right breast lesions, consistent with the given history of breast cancer, without evidence of metastatic disease.  CT ABDOMEN AND PELVIS  Findings:  Liver, gallbladder, adrenal glands, kidneys, spleen, pancreas, stomach and small bowel are unremarkable.  Appendix is normal.  A fair amount of stool is seen in the colon, indicative of constipation.  No pathologically enlarged lymph nodes.  Uterus and ovaries are visualized.  No free fluid.  No worrisome lytic or sclerotic lesions.  IMPRESSION: No evidence of metastatic disease in the abdomen or pelvis.   Original Report Authenticated By: Leanna Battles, M.D.   Mr Breast Bilateral W Wo Contrast  08/27/2012   *RADIOLOGY REPORT*  Clinical Data: Newly-diagnosed right breast invasive ductal carcinoma manifesting as a palpable mass.  BILATERAL BREAST MRI WITH AND WITHOUT CONTRAST  Technique: Multiplanar, multisequence MR images of both breasts were obtained prior to and following the intravenous administration of 16ml of Multihance.  Three dimensional images were evaluated at the independent DynaCad workstation.  Comparison:  [place] mammograms 2014 and 2012.  We are not provided with post biopsy mammograms to indicate whether a  clip was placed.  Findings: Background parenchymal enhancement pattern is mild. Breast parenchymal pattern suggests heterogeneously dense parenchyma.  In the right breast upper outer quadrant is an irregular lobulated mass with central T2 hyperintensity suggesting necrosis or biopsy artifact, with a satellite nodule or lobulation within 2 mm at its superior aspect, measured together as 4.5 x 4.0 x 3.8 cm.  This demonstrates washin/washout type enhancement kinetics.  This corresponds to the reported biopsy-proven breast cancer.  Internal  signal voids are noted which may indicate gas, clip artifact, or less likely calcification.  There is extension of enhancement to the nipple, suggesting nipple involvement may be present.  No other area of abnormal enhancement is seen in either breast.  No lymphadenopathy or T2-weighted hyperintensity elsewhere in either breast.  IMPRESSION: Dominant right breast upper outer quadrant irregular mass corresponding to the biopsy-proven breast cancer, measuring 4.5 cm in total and including the satellite nodule or lobulation at its superior aspect, which is within 2 mm of the dominant mass.  It is not clear from the provided images whether a clip was placed at prior biopsy.  If the patient is to undergo neoadjuvant chemotherapy, placement of a clip is highly recommended, because the mass could become occult on imaging if there is a favorable treatment response, and therefore may not be accurately localized if lumpectomy is planned.  Extension of enhancement from the mass to the right nipple may suggest nipple involvement.  No evidence for contralateral left-sided abnormal enhancement.  RECOMMENDATION: Treatment plan  THREE-DIMENSIONAL MR IMAGE RENDERING ON INDEPENDENT WORKSTATION:  Three-dimensional MR images were rendered by post-processing of the original MR data on an independent workstation.  The three- dimensional MR images were interpreted, and findings were reported in the  accompanying complete MRI report for this study.  BI-RADS CATEGORY 6:  Known biopsy-proven malignancy - appropriate action should be taken.   Original Report Authenticated By: Christiana Pellant, M.D.   Nm Pet Image Initial (pi) Skull Base To Thigh  08/29/2012   *RADIOLOGY REPORT*  Clinical Data: Initial treatment strategy for breast cancer.  NUCLEAR MEDICINE PET SKULL BASE TO THIGH  Fasting Blood Glucose:  88  Technique:  17.6 mCi F-18 FDG was injected intravenously. CT data was obtained and used for attenuation correction and anatomic localization only.  (This was not acquired as a diagnostic CT examination.) Additional exam technical data entered on technologist worksheet.  Comparison:  CT chest abdomen pelvis 08/29/2012.  Findings:  Neck: No hypermetabolic lymph nodes in the neck.  Hypermetabolic brown fat is noted.  CT images show no acute findings.  Chest:  No hypermetabolic mediastinal or hilar lymph nodes.  No suspicious pulmonary nodules.  There is hypermetabolic brown fat.  Primary right breast mass measures 3.2 x 3.3 cm with an S U V max of 16.0.  There is an adjacent nodule along the superomedial border of the primary mass, measuring 1.4 cm, also hypermetabolic.  No additional areas of abnormal hypermetabolism in the chest.  CT images show no acute findings.  Specifically, no pericardial or pleural effusion.  Abdomen/Pelvis:  No abnormal hypermetabolic activity within the liver, pancreas, adrenal glands or spleen.  No hypermetabolic lymph nodes.  CT images show no acute findings.  Skeleton:  No focal hypermetabolic activity to suggest skeletal metastasis.  IMPRESSION: Hypermetabolic right breast lesions, consistent with the given history breast cancer, without evidence of metastatic disease.   Original Report Authenticated By: Leanna Battles, M.D.   Dg Chest Port 1 View  09/12/2012   *RADIOLOGY REPORT*  Clinical Data: Port-A-Cath placement  PORTABLE CHEST - 1 VIEW  Comparison: CT chest 08/29/2012   Findings: Left subclavian Port-A-Cath tip in the mid SVC.  No pneumothorax  The lungs are clear.  IMPRESSION: Satisfactory Port-A-Cath placement.   Original Report Authenticated By: Janeece Riggers, M.D.   Dg Fluoro Guide Cv Line-no Report  09/12/2012   CLINICAL DATA: right breast mass   FLOURO GUIDE CV LINE  Fluoroscopy was utilized by the requesting  physician.  No radiographic  interpretation.     ASSESSMENT: 47 year old female with  #1 T2 NX MX invasive ductal carcinoma of the breast found a self breast examination of the right breast. She is a good candidate for neoadjuvant chemotherapy. We discussed treatment with Adriamycin Cytoxan followed by Taxol. She will receive dose dense a.c. for 4 cycles followed by Taxol weekly for 12 weeks. She is scheduled to have a Port-A-Cath placed next week by Dr. Emelia Loron. She desires to begin treatment as soon as possible.  #2 we discussed her CT scan results and PET scan results as well as her MRI results today.  #3 patient has begun neoadjuvant chemotherapy with AC on 7/18. Total 4 cycles planned, after completion of AC she will begin single agent taxol weekly x 12 weeks  #4. Neutropenia due to chemotherapy  #5 perirectal lesion   PLAN:  #1 Neutropenic precaution s discussed today  2. Begin cipro 500 mg twice for 14 days  3. Good dressing for the perirectal  4. I will see her back in 1 week  All questions were answered. The patient knows to call the clinic with any problems, questions or concerns. We can certainly see the patient much sooner if necessary.  I spent 25 minutes counseling the patient face to face. The total time spent in the appointment was 30 minutes.    Drue Second, MD Medical/Oncology [facility] [phone] (beeper) [phone] (Office)

## 2012-10-04 NOTE — Telephone Encounter (Signed)
appts made and printed...td 

## 2012-10-06 NOTE — Progress Notes (Signed)
OFFICE PROGRESS NOTE  CC  [name],[name], MD [address] [place] [address] Dr. Reuel Boom Pomposini  Dr. Emelia Loron  Dr. Lurline Hare  DIAGNOSIS: 47 year old female with new diagnosis of stage II right breast cancer  STAGE:  Right breast  Stage II,T2NxMx  ER+PR-Her2Neu-  Ki-67 high   PRIOR THERAPY:  #1June 2013 had a mammogram that was normal. But there was on physical exam possibility of a cyst noted in the right breast. The mammogram was negative. In 2014 June patient noted on exam another lump in the right breast. She underwent a diagnostic mammogram on June 10 that showed a right breast nodule in the outer quadrant. She had an ultrasound performed that showed at the 9:30 o'clock position a 3.6 cm area and then added 10:00 position 1.3 cm area with a total area being anywhere between 5-6 cm. The patient went on to have a right breast biopsy performed in [place]. The pathology revealed an invasive ductal carcinoma. This is been confirmed by our pathology as well. The carcinoma and papillary features and was felt to be between a grade 1 and 2. The tumor was estrogen receptor positive strongly (100%) progesterone receptor negative HER-2/neu negative with a Ki-67 that showed a high proliferation rate.  #2 patient was seen by me on 08/22/2012 to discuss treatment options. Due to the size of the tumor I had recommended that patient proceed with neoadjuvant treatment initially consisting of chemotherapy. However due to the workup being incomplete I did recommend that she undergo MRI of the breasts as well as staging studies. When she has had. MRI of the breasts performed on 08/27/2012 revealed in the right upper quadrant irregular lobulated mass with a satellite nodule in ambulation within 2 mm at its superior aspect measured together as 4.5 x 4.0 x 3.8 cm. No lymphadenopathy was noted there was no any other area of abnormal enhancement in either breast. Patient also had PET scans  performed on 73 that revealed the primary breast cancer measuring 3.2 x 3.3 cm with SUV of 16. It was adjacent nodule along his pure medial border of the primary mass measuring 1.4 cm which was also hypermetabolic. There were no additional areas of abnormal hypermetabolism In the chest. Abdomen and pelvis showed no abnormal hypermetabolic activity within the liver pancreas adrenal glands or spleen. No hypermetabolic lymph nodes. In the skeleton no focal hypermetabolic activity to suggest skeletal metastasis.  #3 patient was also seen by Dr. Emelia Loron as well as Dr. Lurline Hare in consultation. Dr. Dwain Sarna has agreed to place a port for eventual chemotherapy. Patient also has had chemotherapy teaching class. The port will be placed on 09/12/2012.  #4 patient has had her Port-A-Cath placed. She will now proceed with neoadjuvant chemotherapy starting 09/13/2012. Her initial chemotherapy will consist of Adriamycin and Cytoxan given dose dense x4 cycles. This would then be followed by weekly Taxol given for 12 weeks.  CURRENT THERAPY: cycle 1 day 1of Adriamycin and Cytoxan.  INTERVAL HISTORY: Elizabeth Miles 47 y.o. female returns for followup visit prior to her scheduled chemotherapy. Clinically she seems to be doing well. Her Port-A-Cath was placed yesterday she tolerated the procedure well. She has all of her antiemetics available to her. We discussed how to take them. Patient is very anxious. She is still very tearful. Her family members are with her including her son. She has no nausea vomiting fevers chills night sweats headaches shortness of breath chest pains palpitations no peripheral paresthesias. Remainder of  the 10 point review of systems is negative.  MEDICAL HISTORY: Past Medical History  Diagnosis Date  . 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 GERD (gastroesophageal reflux disease)   . GERD (gastroesophageal reflux disease) 08/22/2012  . Allergy   . Complication  of anesthesia     1986 ; problem waking up  . Anxiety     ALLERGIES:  is allergic to darvocet; shellfish allergy; and other.  MEDICATIONS:  Current Outpatient Prescriptions  Medication Sig Dispense Refill  . acetaminophen (TYLENOL) 325 MG tablet Take 650 mg by mouth every 4 (four) hours as needed for pain.      Marland Kitchen Dexlansoprazole (DEXILANT) 30 MG capsule Take 1 capsule (30 mg total) by mouth daily.  30 capsule  7  . ciprofloxacin (CIPRO) 500 MG tablet Take 1 tablet (500 mg total) by mouth 2 (two) times daily.  28 tablet  5  . dexamethasone (DECADRON) 4 MG tablet Take 2 tablets by mouth once a day on the day after chemotherapy and then take 2 tablets two times a day for 2 days. Take with food.  30 tablet  1  . hyaluronate sodium (RADIAPLEXRX) GEL Apply 1 application topically once. Apply after rad txs and bedtime,prn      . ibuprofen (ADVIL,MOTRIN) 200 MG tablet Take 200 mg by mouth every 4 (four) hours as needed for pain.      Marland Kitchen lidocaine-prilocaine (EMLA) cream Apply topically as needed.  30 g  7  . loratadine (CLARITIN) 10 MG tablet Take 10 mg by mouth as needed for allergies.      Marland Kitchen ondansetron (ZOFRAN) 8 MG tablet Take 1 tablet (8 mg total) by mouth 2 (two) times daily as needed. Take two times a day as needed for nausea or vomiting starting on the third day after chemotherapy.  30 tablet  1  . prochlorperazine (COMPAZINE) 10 MG tablet Take 1 tablet (10 mg total) by mouth every 6 (six) hours as needed (Nausea or vomiting).  30 tablet  1   No current facility-administered medications for this visit.    SURGICAL HISTORY:  Past Surgical History  Procedure Laterality Date  . Dilation and curettage of uterus  1993  . Cervical ablation  1983  . Breast biopsy Right 08/06/12  . Popliteal synovial cyst excision  1970  . Portacath placement Left 09/12/2012    Procedure: INSERTION PORT-A-CATH;  Surgeon: Emelia Loron, MD;  Location: [facility] OR;  Service: General;  Laterality: Left;    REVIEW  OF SYSTEMS:  Pertinent items are noted in HPI.   HEALTH MAINTENANCE:   PHYSICAL EXAMINATION: Blood pressure 122/83, pulse 72, temperature 98.4 F (36.9 C), temperature source Oral, resp. rate 20, height 5\' 7"  (1.702 m), weight 189 lb 6.4 oz (85.911 kg), last menstrual period 03/12/2012. Body mass index is 29.66 kg/(m^2). ECOG PERFORMANCE STATUS: 1 - Symptomatic but completely ambulatory  None performed  LABORATORY DATA: Lab Results  Component Value Date   WBC 1.3* 10/04/2012   HGB 11.1* 10/04/2012   HCT 33.4* 10/04/2012   MCV 88.6 10/04/2012   PLT 240 10/04/2012      Chemistry      Component Value Date/Time   NA 141 10/04/2012 1210   K 4.0 10/04/2012 1210   CO2 27 10/04/2012 1210   BUN 8.4 10/04/2012 1210   CREATININE 0.7 10/04/2012 1210      Component Value Date/Time   CALCIUM 9.2 10/04/2012 1210   ALKPHOS 85 10/04/2012 1210  AST 10 10/04/2012 1210   ALT 11 10/04/2012 1210   BILITOT 0.45 10/04/2012 1210       RADIOGRAPHIC STUDIES:  Ct Chest W Contrast  08/29/2012   *RADIOLOGY REPORT*  Clinical Data:  Breast cancer.  CT CHEST, ABDOMEN AND PELVIS WITH CONTRAST  Technique:  Multidetector CT imaging of the chest, abdomen and pelvis was performed following the standard protocol during bolus administration of intravenous contrast.  Contrast: OMNIPAQUE IOHEXOL 300 MG/ML  SOLN  Comparison:  PET 08/29/2012.  CT CHEST  Findings:  No pathologically enlarged mediastinal, hilar, axillary or internal mammary lymph nodes.  Primary right breast mass measures 3.1 x 3.4 cm.  There is a smaller nodule along the superior medial margin of the primary mass, measuring 1.2 x 1.3 cm. Heart size normal.  No pericardial effusion.  Minimal biapical pleural parenchymal scarring.  An oblong 4 mm (2 x 6 mm) nodule along the left major fissure is most consistent with a subpleural lymph node.  Lungs are otherwise clear.  No pleural fluid.  Airway is unremarkable.  IMPRESSION: Right breast lesions, consistent with the given  history of breast cancer, without evidence of metastatic disease.  CT ABDOMEN AND PELVIS  Findings:  Liver, gallbladder, adrenal glands, kidneys, spleen, pancreas, stomach and small bowel are unremarkable.  Appendix is normal.  A fair amount of stool is seen in the colon, indicative of constipation.  No pathologically enlarged lymph nodes.  Uterus and ovaries are visualized.  No free fluid.  No worrisome lytic or sclerotic lesions.  IMPRESSION: No evidence of metastatic disease in the abdomen or pelvis.   Original Report Authenticated By: Leanna Battles, M.D.   Ct Abdomen Pelvis W Contrast  08/29/2012   *RADIOLOGY REPORT*  Clinical Data:  Breast cancer.  CT CHEST, ABDOMEN AND PELVIS WITH CONTRAST  Technique:  Multidetector CT imaging of the chest, abdomen and pelvis was performed following the standard protocol during bolus administration of intravenous contrast.  Contrast: OMNIPAQUE IOHEXOL 300 MG/ML  SOLN  Comparison:  PET 08/29/2012.  CT CHEST  Findings:  No pathologically enlarged mediastinal, hilar, axillary or internal mammary lymph nodes.  Primary right breast mass measures 3.1 x 3.4 cm.  There is a smaller nodule along the superior medial margin of the primary mass, measuring 1.2 x 1.3 cm. Heart size normal.  No pericardial effusion.  Minimal biapical pleural parenchymal scarring.  An oblong 4 mm (2 x 6 mm) nodule along the left major fissure is most consistent with a subpleural lymph node.  Lungs are otherwise clear.  No pleural fluid.  Airway is unremarkable.  IMPRESSION: Right breast lesions, consistent with the given history of breast cancer, without evidence of metastatic disease.  CT ABDOMEN AND PELVIS  Findings:  Liver, gallbladder, adrenal glands, kidneys, spleen, pancreas, stomach and small bowel are unremarkable.  Appendix is normal.  A fair amount of stool is seen in the colon, indicative of constipation.  No pathologically enlarged lymph nodes.  Uterus and ovaries are visualized.  No free  fluid.  No worrisome lytic or sclerotic lesions.  IMPRESSION: No evidence of metastatic disease in the abdomen or pelvis.   Original Report Authenticated By: Leanna Battles, M.D.   Mr Breast Bilateral W Wo Contrast  08/27/2012   *RADIOLOGY REPORT*  Clinical Data: Newly-diagnosed right breast invasive ductal carcinoma manifesting as a palpable mass.  BILATERAL BREAST MRI WITH AND WITHOUT CONTRAST  Technique: Multiplanar, multisequence MR images of both breasts were obtained prior to and following the  intravenous administration of 16ml of Multihance.  Three dimensional images were evaluated at the independent DynaCad workstation.  Comparison:  [place] mammograms 2014 and 2012.  We are not provided with post biopsy mammograms to indicate whether a clip was placed.  Findings: Background parenchymal enhancement pattern is mild. Breast parenchymal pattern suggests heterogeneously dense parenchyma.  In the right breast upper outer quadrant is an irregular lobulated mass with central T2 hyperintensity suggesting necrosis or biopsy artifact, with a satellite nodule or lobulation within 2 mm at its superior aspect, measured together as 4.5 x 4.0 x 3.8 cm.  This demonstrates washin/washout type enhancement kinetics.  This corresponds to the reported biopsy-proven breast cancer.  Internal signal voids are noted which may indicate gas, clip artifact, or less likely calcification.  There is extension of enhancement to the nipple, suggesting nipple involvement may be present.  No other area of abnormal enhancement is seen in either breast.  No lymphadenopathy or T2-weighted hyperintensity elsewhere in either breast.  IMPRESSION: Dominant right breast upper outer quadrant irregular mass corresponding to the biopsy-proven breast cancer, measuring 4.5 cm in total and including the satellite nodule or lobulation at its superior aspect, which is within 2 mm of the dominant mass.  It is not clear from the provided images whether a  clip was placed at prior biopsy.  If the patient is to undergo neoadjuvant chemotherapy, placement of a clip is highly recommended, because the mass could become occult on imaging if there is a favorable treatment response, and therefore may not be accurately localized if lumpectomy is planned.  Extension of enhancement from the mass to the right nipple may suggest nipple involvement.  No evidence for contralateral left-sided abnormal enhancement.  RECOMMENDATION: Treatment plan  THREE-DIMENSIONAL MR IMAGE RENDERING ON INDEPENDENT WORKSTATION:  Three-dimensional MR images were rendered by post-processing of the original MR data on an independent workstation.  The three- dimensional MR images were interpreted, and findings were reported in the accompanying complete MRI report for this study.  BI-RADS CATEGORY 6:  Known biopsy-proven malignancy - appropriate action should be taken.   Original Report Authenticated By: Christiana Pellant, M.D.   Nm Pet Image Initial (pi) Skull Base To Thigh  08/29/2012   *RADIOLOGY REPORT*  Clinical Data: Initial treatment strategy for breast cancer.  NUCLEAR MEDICINE PET SKULL BASE TO THIGH  Fasting Blood Glucose:  88  Technique:  17.6 mCi F-18 FDG was injected intravenously. CT data was obtained and used for attenuation correction and anatomic localization only.  (This was not acquired as a diagnostic CT examination.) Additional exam technical data entered on technologist worksheet.  Comparison:  CT chest abdomen pelvis 08/29/2012.  Findings:  Neck: No hypermetabolic lymph nodes in the neck.  Hypermetabolic brown fat is noted.  CT images show no acute findings.  Chest:  No hypermetabolic mediastinal or hilar lymph nodes.  No suspicious pulmonary nodules.  There is hypermetabolic brown fat.  Primary right breast mass measures 3.2 x 3.3 cm with an S U V max of 16.0.  There is an adjacent nodule along the superomedial border of the primary mass, measuring 1.4 cm, also hypermetabolic.  No  additional areas of abnormal hypermetabolism in the chest.  CT images show no acute findings.  Specifically, no pericardial or pleural effusion.  Abdomen/Pelvis:  No abnormal hypermetabolic activity within the liver, pancreas, adrenal glands or spleen.  No hypermetabolic lymph nodes.  CT images show no acute findings.  Skeleton:  No focal hypermetabolic activity to suggest skeletal metastasis.  IMPRESSION: Hypermetabolic right breast lesions, consistent with the given history breast cancer, without evidence of metastatic disease.   Original Report Authenticated By: Leanna Battles, M.D.   Dg Chest Port 1 View  09/12/2012   *RADIOLOGY REPORT*  Clinical Data: Port-A-Cath placement  PORTABLE CHEST - 1 VIEW  Comparison: CT chest 08/29/2012  Findings: Left subclavian Port-A-Cath tip in the mid SVC.  No pneumothorax  The lungs are clear.  IMPRESSION: Satisfactory Port-A-Cath placement.   Original Report Authenticated By: Janeece Riggers, M.D.   Dg Fluoro Guide Cv Line-no Report  09/12/2012   CLINICAL DATA: right breast mass   FLOURO GUIDE CV LINE  Fluoroscopy was utilized by the requesting physician.  No radiographic  interpretation.     ASSESSMENT: 47 year old female with  #1 T2 NX MX invasive ductal carcinoma of the breast found a self breast examination of the right breast. She is a good candidate for neoadjuvant chemotherapy. We discussed treatment with Adriamycin Cytoxan followed by Taxol. She will receive dose dense a.c. for 4 cycles followed by Taxol weekly for 12 weeks. She is scheduled to have a Port-A-Cath placed next week by Dr. Emelia Loron. She desires to begin treatment as soon as possible.  #2 we discussed her CT scan results and PET scan results as well as her MRI results today.   PLAN:  #1 patient will proceed with cycle #1 of Adriamycin and Cytoxan today. I did add Ativan to help her relax and to make this a tolerable experience for her. Risks and benefits of chemotherapy were discussed  with her.  #2 she will return tomorrow 09/14/2012 for Neulasta injection.  #3 patient will be seen back in one week's time for followup.   All questions were answered. The patient knows to call the clinic with any problems, questions or concerns. We can certainly see the patient much sooner if necessary.  I spent 25 minutes counseling the patient face to face. The total time spent in the appointment was 30 minutes.    Drue Second, MD Medical/Oncology [facility] [phone] (beeper) [phone] (Office)

## 2012-10-06 NOTE — Progress Notes (Signed)
OFFICE PROGRESS NOTE  CC  [name],[name], MD [address] [place] [address] Dr. Reuel Boom Pomposini  Dr. Emelia Loron  Dr. Lurline Hare  DIAGNOSIS: 47 year old female with new diagnosis of stage II right breast cancer  STAGE:  Right breast  Stage II,T2NxMx  ER+PR-Her2Neu-  Ki-67 high   PRIOR THERAPY:  #1June 2013 had a mammogram that was normal. But there was on physical exam possibility of a cyst noted in the right breast. The mammogram was negative. In 2014 June patient noted on exam another lump in the right breast. She underwent a diagnostic mammogram on June 10 that showed a right breast nodule in the outer quadrant. She had an ultrasound performed that showed at the 9:30 o'clock position a 3.6 cm area and then added 10:00 position 1.3 cm area with a total area being anywhere between 5-6 cm. The patient went on to have a right breast biopsy performed in [place]. The pathology revealed an invasive ductal carcinoma. This is been confirmed by our pathology as well. The carcinoma and papillary features and was felt to be between a grade 1 and 2. The tumor was estrogen receptor positive strongly (100%) progesterone receptor negative HER-2/neu negative with a Ki-67 that showed a high proliferation rate.  #2 patient was seen by me on 08/22/2012 to discuss treatment options. Due to the size of the tumor I had recommended that patient proceed with neoadjuvant treatment initially consisting of chemotherapy. However due to the workup being incomplete I did recommend that she undergo MRI of the breasts as well as staging studies. When she has had. MRI of the breasts performed on 08/27/2012 revealed in the right upper quadrant irregular lobulated mass with a satellite nodule in ambulation within 2 mm at its superior aspect measured together as 4.5 x 4.0 x 3.8 cm. No lymphadenopathy was noted there was no any other area of abnormal enhancement in either breast. Patient also had PET scans  performed on 73 that revealed the primary breast cancer measuring 3.2 x 3.3 cm with SUV of 16. It was adjacent nodule along his pure medial border of the primary mass measuring 1.4 cm which was also hypermetabolic. There were no additional areas of abnormal hypermetabolism In the chest. Abdomen and pelvis showed no abnormal hypermetabolic activity within the liver pancreas adrenal glands or spleen. No hypermetabolic lymph nodes. In the skeleton no focal hypermetabolic activity to suggest skeletal metastasis.  #3 patient was also seen by Dr. Emelia Loron as well as Dr. Lurline Hare in consultation. Dr. Dwain Sarna has agreed to place a port for eventual chemotherapy. Patient also has had chemotherapy teaching class. The port will be placed on 09/12/2012.  #4 patient has had her Port-A-Cath placed. She will now proceed with neoadjuvant chemotherapy starting 09/13/2012. Her initial chemotherapy will consist of Adriamycin and Cytoxan given dose dense x4 cycles. This would then be followed by weekly Taxol given for 12 weeks.  CURRENT THERAPY: cycle 2 day 1of Adriamycin and Cytoxan.  INTERVAL HISTORY: Elizabeth Miles 47 y.o. female returns for followup visit . Patient is very anxious.  Her family members are with her including her son. She has no nausea vomiting fevers chills night sweats headaches shortness of breath chest pains palpitations no peripheral paresthesias. Remainder of the 10 point review of systems is negative.  MEDICAL HISTORY: Past Medical History  Diagnosis Date  . 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 GERD (gastroesophageal reflux disease)   . GERD (gastroesophageal reflux  disease) 08/22/2012  . Allergy   . Complication of anesthesia     1986 ; problem waking up  . Anxiety     ALLERGIES:  is allergic to darvocet; shellfish allergy; and other.  MEDICATIONS:  Current Outpatient Prescriptions  Medication Sig Dispense Refill  . dexamethasone (DECADRON) 4 MG  tablet Take 2 tablets by mouth once a day on the day after chemotherapy and then take 2 tablets two times a day for 2 days. Take with food.  30 tablet  1  . Dexlansoprazole (DEXILANT) 30 MG capsule Take 1 capsule (30 mg total) by mouth daily.  30 capsule  7  . lidocaine-prilocaine (EMLA) cream Apply topically as needed.  30 g  7  . loratadine (CLARITIN) 10 MG tablet Take 10 mg by mouth as needed for allergies.      Marland Kitchen acetaminophen (TYLENOL) 325 MG tablet Take 650 mg by mouth every 4 (four) hours as needed for pain.      . ciprofloxacin (CIPRO) 500 MG tablet Take 1 tablet (500 mg total) by mouth 2 (two) times daily.  28 tablet  5  . hyaluronate sodium (RADIAPLEXRX) GEL Apply 1 application topically once. Apply after rad txs and bedtime,prn      . ibuprofen (ADVIL,MOTRIN) 200 MG tablet Take 200 mg by mouth every 4 (four) hours as needed for pain.      Marland Kitchen ondansetron (ZOFRAN) 8 MG tablet Take 1 tablet (8 mg total) by mouth 2 (two) times daily as needed. Take two times a day as needed for nausea or vomiting starting on the third day after chemotherapy.  30 tablet  1  . prochlorperazine (COMPAZINE) 10 MG tablet Take 1 tablet (10 mg total) by mouth every 6 (six) hours as needed (Nausea or vomiting).  30 tablet  1   No current facility-administered medications for this visit.    SURGICAL HISTORY:  Past Surgical History  Procedure Laterality Date  . Dilation and curettage of uterus  1993  . Cervical ablation  1983  . Breast biopsy Right 08/06/12  . Popliteal synovial cyst excision  1970  . Portacath placement Left 09/12/2012    Procedure: INSERTION PORT-A-CATH;  Surgeon: Emelia Loron, MD;  Location: [facility] OR;  Service: General;  Laterality: Left;    REVIEW OF SYSTEMS:  Pertinent items are noted in HPI.   HEALTH MAINTENANCE:   PHYSICAL EXAMINATION: Blood pressure 122/81, pulse 84, temperature 98.5 F (36.9 C), temperature source Oral, resp. rate 20, height 5\' 7"  (1.702 m), weight 190 lb 11.2 oz  (86.501 kg), last menstrual period 03/12/2012. Body mass index is 29.86 kg/(m^2). ECOG PERFORMANCE STATUS: 1 - Symptomatic but completely ambulatory  None performed  LABORATORY DATA: Lab Results  Component Value Date   WBC 1.3* 10/04/2012   HGB 11.1* 10/04/2012   HCT 33.4* 10/04/2012   MCV 88.6 10/04/2012   PLT 240 10/04/2012      Chemistry      Component Value Date/Time   NA 141 10/04/2012 1210   K 4.0 10/04/2012 1210   CO2 27 10/04/2012 1210   BUN 8.4 10/04/2012 1210   CREATININE 0.7 10/04/2012 1210      Component Value Date/Time   CALCIUM 9.2 10/04/2012 1210   ALKPHOS 85 10/04/2012 1210   AST 10 10/04/2012 1210   ALT 11 10/04/2012 1210   BILITOT 0.45 10/04/2012 1210       RADIOGRAPHIC STUDIES:  Ct Chest W Contrast  08/29/2012   *RADIOLOGY REPORT*  Clinical Data:  Breast  cancer.  CT CHEST, ABDOMEN AND PELVIS WITH CONTRAST  Technique:  Multidetector CT imaging of the chest, abdomen and pelvis was performed following the standard protocol during bolus administration of intravenous contrast.  Contrast: OMNIPAQUE IOHEXOL 300 MG/ML  SOLN  Comparison:  PET 08/29/2012.  CT CHEST  Findings:  No pathologically enlarged mediastinal, hilar, axillary or internal mammary lymph nodes.  Primary right breast mass measures 3.1 x 3.4 cm.  There is a smaller nodule along the superior medial margin of the primary mass, measuring 1.2 x 1.3 cm. Heart size normal.  No pericardial effusion.  Minimal biapical pleural parenchymal scarring.  An oblong 4 mm (2 x 6 mm) nodule along the left major fissure is most consistent with a subpleural lymph node.  Lungs are otherwise clear.  No pleural fluid.  Airway is unremarkable.  IMPRESSION: Right breast lesions, consistent with the given history of breast cancer, without evidence of metastatic disease.  CT ABDOMEN AND PELVIS  Findings:  Liver, gallbladder, adrenal glands, kidneys, spleen, pancreas, stomach and small bowel are unremarkable.  Appendix is normal.  A fair amount of stool  is seen in the colon, indicative of constipation.  No pathologically enlarged lymph nodes.  Uterus and ovaries are visualized.  No free fluid.  No worrisome lytic or sclerotic lesions.  IMPRESSION: No evidence of metastatic disease in the abdomen or pelvis.   Original Report Authenticated By: Leanna Battles, M.D.   Ct Abdomen Pelvis W Contrast  08/29/2012   *RADIOLOGY REPORT*  Clinical Data:  Breast cancer.  CT CHEST, ABDOMEN AND PELVIS WITH CONTRAST  Technique:  Multidetector CT imaging of the chest, abdomen and pelvis was performed following the standard protocol during bolus administration of intravenous contrast.  Contrast: OMNIPAQUE IOHEXOL 300 MG/ML  SOLN  Comparison:  PET 08/29/2012.  CT CHEST  Findings:  No pathologically enlarged mediastinal, hilar, axillary or internal mammary lymph nodes.  Primary right breast mass measures 3.1 x 3.4 cm.  There is a smaller nodule along the superior medial margin of the primary mass, measuring 1.2 x 1.3 cm. Heart size normal.  No pericardial effusion.  Minimal biapical pleural parenchymal scarring.  An oblong 4 mm (2 x 6 mm) nodule along the left major fissure is most consistent with a subpleural lymph node.  Lungs are otherwise clear.  No pleural fluid.  Airway is unremarkable.  IMPRESSION: Right breast lesions, consistent with the given history of breast cancer, without evidence of metastatic disease.  CT ABDOMEN AND PELVIS  Findings:  Liver, gallbladder, adrenal glands, kidneys, spleen, pancreas, stomach and small bowel are unremarkable.  Appendix is normal.  A fair amount of stool is seen in the colon, indicative of constipation.  No pathologically enlarged lymph nodes.  Uterus and ovaries are visualized.  No free fluid.  No worrisome lytic or sclerotic lesions.  IMPRESSION: No evidence of metastatic disease in the abdomen or pelvis.   Original Report Authenticated By: Leanna Battles, M.D.   Mr Breast Bilateral W Wo Contrast  08/27/2012   *RADIOLOGY REPORT*   Clinical Data: Newly-diagnosed right breast invasive ductal carcinoma manifesting as a palpable mass.  BILATERAL BREAST MRI WITH AND WITHOUT CONTRAST  Technique: Multiplanar, multisequence MR images of both breasts were obtained prior to and following the intravenous administration of 16ml of Multihance.  Three dimensional images were evaluated at the independent DynaCad workstation.  Comparison:  [place] mammograms 2014 and 2012.  We are not provided with post biopsy mammograms to indicate whether a clip was  placed.  Findings: Background parenchymal enhancement pattern is mild. Breast parenchymal pattern suggests heterogeneously dense parenchyma.  In the right breast upper outer quadrant is an irregular lobulated mass with central T2 hyperintensity suggesting necrosis or biopsy artifact, with a satellite nodule or lobulation within 2 mm at its superior aspect, measured together as 4.5 x 4.0 x 3.8 cm.  This demonstrates washin/washout type enhancement kinetics.  This corresponds to the reported biopsy-proven breast cancer.  Internal signal voids are noted which may indicate gas, clip artifact, or less likely calcification.  There is extension of enhancement to the nipple, suggesting nipple involvement may be present.  No other area of abnormal enhancement is seen in either breast.  No lymphadenopathy or T2-weighted hyperintensity elsewhere in either breast.  IMPRESSION: Dominant right breast upper outer quadrant irregular mass corresponding to the biopsy-proven breast cancer, measuring 4.5 cm in total and including the satellite nodule or lobulation at its superior aspect, which is within 2 mm of the dominant mass.  It is not clear from the provided images whether a clip was placed at prior biopsy.  If the patient is to undergo neoadjuvant chemotherapy, placement of a clip is highly recommended, because the mass could become occult on imaging if there is a favorable treatment response, and therefore may not be  accurately localized if lumpectomy is planned.  Extension of enhancement from the mass to the right nipple may suggest nipple involvement.  No evidence for contralateral left-sided abnormal enhancement.  RECOMMENDATION: Treatment plan  THREE-DIMENSIONAL MR IMAGE RENDERING ON INDEPENDENT WORKSTATION:  Three-dimensional MR images were rendered by post-processing of the original MR data on an independent workstation.  The three- dimensional MR images were interpreted, and findings were reported in the accompanying complete MRI report for this study.  BI-RADS CATEGORY 6:  Known biopsy-proven malignancy - appropriate action should be taken.   Original Report Authenticated By: Christiana Pellant, M.D.   Nm Pet Image Initial (pi) Skull Base To Thigh  08/29/2012   *RADIOLOGY REPORT*  Clinical Data: Initial treatment strategy for breast cancer.  NUCLEAR MEDICINE PET SKULL BASE TO THIGH  Fasting Blood Glucose:  88  Technique:  17.6 mCi F-18 FDG was injected intravenously. CT data was obtained and used for attenuation correction and anatomic localization only.  (This was not acquired as a diagnostic CT examination.) Additional exam technical data entered on technologist worksheet.  Comparison:  CT chest abdomen pelvis 08/29/2012.  Findings:  Neck: No hypermetabolic lymph nodes in the neck.  Hypermetabolic brown fat is noted.  CT images show no acute findings.  Chest:  No hypermetabolic mediastinal or hilar lymph nodes.  No suspicious pulmonary nodules.  There is hypermetabolic brown fat.  Primary right breast mass measures 3.2 x 3.3 cm with an S U V max of 16.0.  There is an adjacent nodule along the superomedial border of the primary mass, measuring 1.4 cm, also hypermetabolic.  No additional areas of abnormal hypermetabolism in the chest.  CT images show no acute findings.  Specifically, no pericardial or pleural effusion.  Abdomen/Pelvis:  No abnormal hypermetabolic activity within the liver, pancreas, adrenal glands or  spleen.  No hypermetabolic lymph nodes.  CT images show no acute findings.  Skeleton:  No focal hypermetabolic activity to suggest skeletal metastasis.  IMPRESSION: Hypermetabolic right breast lesions, consistent with the given history breast cancer, without evidence of metastatic disease.   Original Report Authenticated By: Leanna Battles, M.D.   Dg Chest Port 1 View  09/12/2012   *RADIOLOGY REPORT*  Clinical Data: Port-A-Cath placement  PORTABLE CHEST - 1 VIEW  Comparison: CT chest 08/29/2012  Findings: Left subclavian Port-A-Cath tip in the mid SVC.  No pneumothorax  The lungs are clear.  IMPRESSION: Satisfactory Port-A-Cath placement.   Original Report Authenticated By: Janeece Riggers, M.D.   Dg Fluoro Guide Cv Line-no Report  09/12/2012   CLINICAL DATA: right breast mass   FLOURO GUIDE CV LINE  Fluoroscopy was utilized by the requesting physician.  No radiographic  interpretation.     ASSESSMENT: 47 year old female with  #1 T2 NX MX invasive ductal carcinoma of the breast found a self breast examination of the right breast. She is a good candidate for neoadjuvant chemotherapy. We discussed treatment with Adriamycin Cytoxan followed by Taxol. She will receive dose dense a.c. for 4 cycles followed by Taxol weekly for 12 weeks. She is scheduled to have a Port-A-Cath placed next week by Dr. Emelia Loron. She desires to begin treatment as soon as possible.  #2 we discussed her CT scan results and PET scan results as well as her MRI results today.  #3 patient received first cycle of Adriamycin and Cytoxan on 09/13/2012. She is now seen in followup. Major complaint with the treatment with somnolence. We do think it may be due to the Ativan. I will discontinue the Ativan with her next cycle  of therapy.   PLAN:   #1 patient will proceed with cycle #2 of Adriamycin and Cytoxan today.  #2 she'll return in one week's time for interim lab  All questions were answered. The patient knows to call the  clinic with any problems, questions or concerns. We can certainly see the patient much sooner if necessary.  I spent 25 minutes counseling the patient face to face. The total time spent in the appointment was 30 minutes.    Drue Second, MD Medical/Oncology [facility] [phone] (beeper) [phone] (Office)

## 2012-10-06 NOTE — Progress Notes (Signed)
OFFICE PROGRESS NOTE  CC  [name],[name], MD [address] [place] [address] Dr. Reuel Boom Pomposini  Dr. Emelia Loron  Dr. Lurline Hare  DIAGNOSIS: 47 year old female with new diagnosis of stage II right breast cancer  STAGE:  Right breast  Stage II,T2NxMx  ER+PR-Her2Neu-  Ki-67 high   PRIOR THERAPY:  #1June 2013 had a mammogram that was normal. But there was on physical exam possibility of a cyst noted in the right breast. The mammogram was negative. In 2014 June patient noted on exam another lump in the right breast. She underwent a diagnostic mammogram on June 10 that showed a right breast nodule in the outer quadrant. She had an ultrasound performed that showed at the 9:30 o'clock position a 3.6 cm area and then added 10:00 position 1.3 cm area with a total area being anywhere between 5-6 cm. The patient went on to have a right breast biopsy performed in [place]. The pathology revealed an invasive ductal carcinoma. This is been confirmed by our pathology as well. The carcinoma and papillary features and was felt to be between a grade 1 and 2. The tumor was estrogen receptor positive strongly (100%) progesterone receptor negative HER-2/neu negative with a Ki-67 that showed a high proliferation rate.  #2 patient was seen by me on 08/22/2012 to discuss treatment options. Due to the size of the tumor I had recommended that patient proceed with neoadjuvant treatment initially consisting of chemotherapy. However due to the workup being incomplete I did recommend that she undergo MRI of the breasts as well as staging studies. When she has had. MRI of the breasts performed on 08/27/2012 revealed in the right upper quadrant irregular lobulated mass with a satellite nodule in ambulation within 2 mm at its superior aspect measured together as 4.5 x 4.0 x 3.8 cm. No lymphadenopathy was noted there was no any other area of abnormal enhancement in either breast. Patient also had PET scans  performed on 73 that revealed the primary breast cancer measuring 3.2 x 3.3 cm with SUV of 16. It was adjacent nodule along his pure medial border of the primary mass measuring 1.4 cm which was also hypermetabolic. There were no additional areas of abnormal hypermetabolism In the chest. Abdomen and pelvis showed no abnormal hypermetabolic activity within the liver pancreas adrenal glands or spleen. No hypermetabolic lymph nodes. In the skeleton no focal hypermetabolic activity to suggest skeletal metastasis.  #3 patient was also seen by Dr. Emelia Loron as well as Dr. Lurline Hare in consultation. Dr. Dwain Sarna has agreed to place a port for eventual chemotherapy. Patient also has had chemotherapy teaching class. The port will be placed on 09/12/2012.  #4 patient has had her Port-A-Cath placed. She will now proceed with neoadjuvant chemotherapy starting 09/13/2012. Her initial chemotherapy will consist of Adriamycin and Cytoxan given dose dense x4 cycles. This would then be followed by weekly Taxol given for 12 weeks.  CURRENT THERAPY: s/p cycle 1 day 1of Adriamycin and Cytoxan.  INTERVAL HISTORY: Elizabeth Miles 47 y.o. female returns for followup visit . Patient is very anxious. She is still very tearful. Her family members are with her including her son. She has no nausea vomiting fevers chills night sweats headaches shortness of breath chest pains palpitations no peripheral paresthesias. Remainder of the 10 point review of systems is negative.  MEDICAL HISTORY: Past Medical History  Diagnosis Date  . 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 GERD (gastroesophageal reflux disease)   .  GERD (gastroesophageal reflux disease) 08/22/2012  . Allergy   . Complication of anesthesia     1986 ; problem waking up  . Anxiety     ALLERGIES:  is allergic to darvocet; shellfish allergy; and other.  MEDICATIONS:  Current Outpatient Prescriptions  Medication Sig Dispense Refill  .  acetaminophen (TYLENOL) 325 MG tablet Take 650 mg by mouth every 4 (four) hours as needed for pain.      . ciprofloxacin (CIPRO) 500 MG tablet Take 1 tablet (500 mg total) by mouth 2 (two) times daily.  28 tablet  5  . dexamethasone (DECADRON) 4 MG tablet Take 2 tablets by mouth once a day on the day after chemotherapy and then take 2 tablets two times a day for 2 days. Take with food.  30 tablet  1  . Dexlansoprazole (DEXILANT) 30 MG capsule Take 1 capsule (30 mg total) by mouth daily.  30 capsule  7  . hyaluronate sodium (RADIAPLEXRX) GEL Apply 1 application topically once. Apply after rad txs and bedtime,prn      . ibuprofen (ADVIL,MOTRIN) 200 MG tablet Take 200 mg by mouth every 4 (four) hours as needed for pain.      Marland Kitchen lidocaine-prilocaine (EMLA) cream Apply topically as needed.  30 g  7  . loratadine (CLARITIN) 10 MG tablet Take 10 mg by mouth as needed for allergies.      Marland Kitchen ondansetron (ZOFRAN) 8 MG tablet Take 1 tablet (8 mg total) by mouth 2 (two) times daily as needed. Take two times a day as needed for nausea or vomiting starting on the third day after chemotherapy.  30 tablet  1  . prochlorperazine (COMPAZINE) 10 MG tablet Take 1 tablet (10 mg total) by mouth every 6 (six) hours as needed (Nausea or vomiting).  30 tablet  1   No current facility-administered medications for this visit.    SURGICAL HISTORY:  Past Surgical History  Procedure Laterality Date  . Dilation and curettage of uterus  1993  . Cervical ablation  1983  . Breast biopsy Right 08/06/12  . Popliteal synovial cyst excision  1970  . Portacath placement Left 09/12/2012    Procedure: INSERTION PORT-A-CATH;  Surgeon: Emelia Loron, MD;  Location: [facility] OR;  Service: General;  Laterality: Left;    REVIEW OF SYSTEMS:  Pertinent items are noted in HPI.   HEALTH MAINTENANCE:   PHYSICAL EXAMINATION: Blood pressure 124/83, pulse 97, temperature 98.9 F (37.2 C), temperature source Oral, resp. rate 20, height 5\' 7"   (1.702 m), weight 188 lb 12.8 oz (85.639 kg), last menstrual period 03/12/2012. Body mass index is 29.56 kg/(m^2). ECOG PERFORMANCE STATUS: 1 - Symptomatic but completely ambulatory  None performed  LABORATORY DATA: Lab Results  Component Value Date   WBC 1.3* 10/04/2012   HGB 11.1* 10/04/2012   HCT 33.4* 10/04/2012   MCV 88.6 10/04/2012   PLT 240 10/04/2012      Chemistry      Component Value Date/Time   NA 141 10/04/2012 1210   K 4.0 10/04/2012 1210   CO2 27 10/04/2012 1210   BUN 8.4 10/04/2012 1210   CREATININE 0.7 10/04/2012 1210      Component Value Date/Time   CALCIUM 9.2 10/04/2012 1210   ALKPHOS 85 10/04/2012 1210   AST 10 10/04/2012 1210   ALT 11 10/04/2012 1210   BILITOT 0.45 10/04/2012 1210       RADIOGRAPHIC STUDIES:  Ct Chest W Contrast  08/29/2012   *RADIOLOGY REPORT*  Clinical  Data:  Breast cancer.  CT CHEST, ABDOMEN AND PELVIS WITH CONTRAST  Technique:  Multidetector CT imaging of the chest, abdomen and pelvis was performed following the standard protocol during bolus administration of intravenous contrast.  Contrast: OMNIPAQUE IOHEXOL 300 MG/ML  SOLN  Comparison:  PET 08/29/2012.  CT CHEST  Findings:  No pathologically enlarged mediastinal, hilar, axillary or internal mammary lymph nodes.  Primary right breast mass measures 3.1 x 3.4 cm.  There is a smaller nodule along the superior medial margin of the primary mass, measuring 1.2 x 1.3 cm. Heart size normal.  No pericardial effusion.  Minimal biapical pleural parenchymal scarring.  An oblong 4 mm (2 x 6 mm) nodule along the left major fissure is most consistent with a subpleural lymph node.  Lungs are otherwise clear.  No pleural fluid.  Airway is unremarkable.  IMPRESSION: Right breast lesions, consistent with the given history of breast cancer, without evidence of metastatic disease.  CT ABDOMEN AND PELVIS  Findings:  Liver, gallbladder, adrenal glands, kidneys, spleen, pancreas, stomach and small bowel are unremarkable.  Appendix is  normal.  A fair amount of stool is seen in the colon, indicative of constipation.  No pathologically enlarged lymph nodes.  Uterus and ovaries are visualized.  No free fluid.  No worrisome lytic or sclerotic lesions.  IMPRESSION: No evidence of metastatic disease in the abdomen or pelvis.   Original Report Authenticated By: Leanna Battles, M.D.   Ct Abdomen Pelvis W Contrast  08/29/2012   *RADIOLOGY REPORT*  Clinical Data:  Breast cancer.  CT CHEST, ABDOMEN AND PELVIS WITH CONTRAST  Technique:  Multidetector CT imaging of the chest, abdomen and pelvis was performed following the standard protocol during bolus administration of intravenous contrast.  Contrast: OMNIPAQUE IOHEXOL 300 MG/ML  SOLN  Comparison:  PET 08/29/2012.  CT CHEST  Findings:  No pathologically enlarged mediastinal, hilar, axillary or internal mammary lymph nodes.  Primary right breast mass measures 3.1 x 3.4 cm.  There is a smaller nodule along the superior medial margin of the primary mass, measuring 1.2 x 1.3 cm. Heart size normal.  No pericardial effusion.  Minimal biapical pleural parenchymal scarring.  An oblong 4 mm (2 x 6 mm) nodule along the left major fissure is most consistent with a subpleural lymph node.  Lungs are otherwise clear.  No pleural fluid.  Airway is unremarkable.  IMPRESSION: Right breast lesions, consistent with the given history of breast cancer, without evidence of metastatic disease.  CT ABDOMEN AND PELVIS  Findings:  Liver, gallbladder, adrenal glands, kidneys, spleen, pancreas, stomach and small bowel are unremarkable.  Appendix is normal.  A fair amount of stool is seen in the colon, indicative of constipation.  No pathologically enlarged lymph nodes.  Uterus and ovaries are visualized.  No free fluid.  No worrisome lytic or sclerotic lesions.  IMPRESSION: No evidence of metastatic disease in the abdomen or pelvis.   Original Report Authenticated By: Leanna Battles, M.D.   Mr Breast Bilateral W Wo  Contrast  08/27/2012   *RADIOLOGY REPORT*  Clinical Data: Newly-diagnosed right breast invasive ductal carcinoma manifesting as a palpable mass.  BILATERAL BREAST MRI WITH AND WITHOUT CONTRAST  Technique: Multiplanar, multisequence MR images of both breasts were obtained prior to and following the intravenous administration of 16ml of Multihance.  Three dimensional images were evaluated at the independent DynaCad workstation.  Comparison:  [place] mammograms 2014 and 2012.  We are not provided with post biopsy mammograms to indicate whether  a clip was placed.  Findings: Background parenchymal enhancement pattern is mild. Breast parenchymal pattern suggests heterogeneously dense parenchyma.  In the right breast upper outer quadrant is an irregular lobulated mass with central T2 hyperintensity suggesting necrosis or biopsy artifact, with a satellite nodule or lobulation within 2 mm at its superior aspect, measured together as 4.5 x 4.0 x 3.8 cm.  This demonstrates washin/washout type enhancement kinetics.  This corresponds to the reported biopsy-proven breast cancer.  Internal signal voids are noted which may indicate gas, clip artifact, or less likely calcification.  There is extension of enhancement to the nipple, suggesting nipple involvement may be present.  No other area of abnormal enhancement is seen in either breast.  No lymphadenopathy or T2-weighted hyperintensity elsewhere in either breast.  IMPRESSION: Dominant right breast upper outer quadrant irregular mass corresponding to the biopsy-proven breast cancer, measuring 4.5 cm in total and including the satellite nodule or lobulation at its superior aspect, which is within 2 mm of the dominant mass.  It is not clear from the provided images whether a clip was placed at prior biopsy.  If the patient is to undergo neoadjuvant chemotherapy, placement of a clip is highly recommended, because the mass could become occult on imaging if there is a favorable  treatment response, and therefore may not be accurately localized if lumpectomy is planned.  Extension of enhancement from the mass to the right nipple may suggest nipple involvement.  No evidence for contralateral left-sided abnormal enhancement.  RECOMMENDATION: Treatment plan  THREE-DIMENSIONAL MR IMAGE RENDERING ON INDEPENDENT WORKSTATION:  Three-dimensional MR images were rendered by post-processing of the original MR data on an independent workstation.  The three- dimensional MR images were interpreted, and findings were reported in the accompanying complete MRI report for this study.  BI-RADS CATEGORY 6:  Known biopsy-proven malignancy - appropriate action should be taken.   Original Report Authenticated By: Christiana Pellant, M.D.   Nm Pet Image Initial (pi) Skull Base To Thigh  08/29/2012   *RADIOLOGY REPORT*  Clinical Data: Initial treatment strategy for breast cancer.  NUCLEAR MEDICINE PET SKULL BASE TO THIGH  Fasting Blood Glucose:  88  Technique:  17.6 mCi F-18 FDG was injected intravenously. CT data was obtained and used for attenuation correction and anatomic localization only.  (This was not acquired as a diagnostic CT examination.) Additional exam technical data entered on technologist worksheet.  Comparison:  CT chest abdomen pelvis 08/29/2012.  Findings:  Neck: No hypermetabolic lymph nodes in the neck.  Hypermetabolic brown fat is noted.  CT images show no acute findings.  Chest:  No hypermetabolic mediastinal or hilar lymph nodes.  No suspicious pulmonary nodules.  There is hypermetabolic brown fat.  Primary right breast mass measures 3.2 x 3.3 cm with an S U V max of 16.0.  There is an adjacent nodule along the superomedial border of the primary mass, measuring 1.4 cm, also hypermetabolic.  No additional areas of abnormal hypermetabolism in the chest.  CT images show no acute findings.  Specifically, no pericardial or pleural effusion.  Abdomen/Pelvis:  No abnormal hypermetabolic activity within  the liver, pancreas, adrenal glands or spleen.  No hypermetabolic lymph nodes.  CT images show no acute findings.  Skeleton:  No focal hypermetabolic activity to suggest skeletal metastasis.  IMPRESSION: Hypermetabolic right breast lesions, consistent with the given history breast cancer, without evidence of metastatic disease.   Original Report Authenticated By: Leanna Battles, M.D.   Dg Chest Port 1 View  09/12/2012   *  RADIOLOGY REPORT*  Clinical Data: Port-A-Cath placement  PORTABLE CHEST - 1 VIEW  Comparison: CT chest 08/29/2012  Findings: Left subclavian Port-A-Cath tip in the mid SVC.  No pneumothorax  The lungs are clear.  IMPRESSION: Satisfactory Port-A-Cath placement.   Original Report Authenticated By: Janeece Riggers, M.D.   Dg Fluoro Guide Cv Line-no Report  09/12/2012   CLINICAL DATA: right breast mass   FLOURO GUIDE CV LINE  Fluoroscopy was utilized by the requesting physician.  No radiographic  interpretation.     ASSESSMENT: 47 year old female with  #1 T2 NX MX invasive ductal carcinoma of the breast found a self breast examination of the right breast. She is a good candidate for neoadjuvant chemotherapy. We discussed treatment with Adriamycin Cytoxan followed by Taxol. She will receive dose dense a.c. for 4 cycles followed by Taxol weekly for 12 weeks. She is scheduled to have a Port-A-Cath placed next week by Dr. Emelia Loron. She desires to begin treatment as soon as possible.  #2 we discussed her CT scan results and PET scan results as well as her MRI results today.  #3 patient received first cycle of Adriamycin and Cytoxan on 09/13/2012. She is now seen in followup. Major complaint with the treatment with somnolence. We do think it may be due to the Ativan. I will discontinue the Ativan with her next cycle  of therapy.   PLAN:  #1 neutropenia: Cipro 500 mg twice a day and neutropenic precautions were discussed.  #2 somnolence likely due to the Ativan that was given to her  with her chemotherapy we will discontinue this with her next treatments.  #3 anxiety significantly improved.  #4 patient will return in one week's time for cycle 2 of Adriamycin and Cytoxan.   All questions were answered. The patient knows to call the clinic with any problems, questions or concerns. We can certainly see the patient much sooner if necessary.  I spent 25 minutes counseling the patient face to face. The total time spent in the appointment was 30 minutes.    Drue Second, MD Medical/Oncology [facility] [phone] (beeper) [phone] (Office)

## 2012-10-11 ENCOUNTER — Ambulatory Visit (HOSPITAL_BASED_OUTPATIENT_CLINIC_OR_DEPARTMENT_OTHER): Payer: BC Managed Care – PPO | Admitting: Oncology

## 2012-10-11 ENCOUNTER — Encounter: Payer: Self-pay | Admitting: Oncology

## 2012-10-11 ENCOUNTER — Ambulatory Visit (HOSPITAL_BASED_OUTPATIENT_CLINIC_OR_DEPARTMENT_OTHER): Payer: BC Managed Care – PPO

## 2012-10-11 ENCOUNTER — Other Ambulatory Visit (HOSPITAL_BASED_OUTPATIENT_CLINIC_OR_DEPARTMENT_OTHER): Payer: BC Managed Care – PPO

## 2012-10-11 ENCOUNTER — Other Ambulatory Visit: Payer: BC Managed Care – PPO | Admitting: Lab

## 2012-10-11 VITALS — BP 129/85 | HR 91 | Temp 98.3°F | Resp 20 | Ht 67.0 in | Wt 190.1 lb

## 2012-10-11 DIAGNOSIS — C50911 Malignant neoplasm of unspecified site of right female breast: Secondary | ICD-10-CM

## 2012-10-11 DIAGNOSIS — C50919 Malignant neoplasm of unspecified site of unspecified female breast: Secondary | ICD-10-CM

## 2012-10-11 DIAGNOSIS — Z5111 Encounter for antineoplastic chemotherapy: Secondary | ICD-10-CM

## 2012-10-11 DIAGNOSIS — C50419 Malignant neoplasm of upper-outer quadrant of unspecified female breast: Secondary | ICD-10-CM

## 2012-10-11 DIAGNOSIS — Z170 Estrogen receptor positive status [ER+]: Secondary | ICD-10-CM

## 2012-10-11 LAB — CBC WITH DIFFERENTIAL/PLATELET
BASO%: 0.4 % (ref 0.0–2.0)
Basophils Absolute: 0 10*3/uL (ref 0.0–0.1)
EOS%: 0.1 % (ref 0.0–7.0)
Eosinophils Absolute: 0 10*3/uL (ref 0.0–0.5)
HCT: 35.6 % (ref 34.8–46.6)
HGB: 11.9 g/dL (ref 11.6–15.9)
LYMPH%: 7.1 % — ABNORMAL LOW (ref 14.0–49.7)
MCH: 29.7 pg (ref 25.1–34.0)
MCHC: 33.4 g/dL (ref 31.5–36.0)
MCV: 88.7 fL (ref 79.5–101.0)
MONO#: 0.9 10*3/uL (ref 0.1–0.9)
MONO%: 6.3 % (ref 0.0–14.0)
NEUT#: 11.7 10*3/uL — ABNORMAL HIGH (ref 1.5–6.5)
NEUT%: 86.1 % — ABNORMAL HIGH (ref 38.4–76.8)
Platelets: 288 10*3/uL (ref 145–400)
RBC: 4.01 10*6/uL (ref 3.70–5.45)
RDW: 12.8 % (ref 11.2–14.5)
WBC: 13.6 10*3/uL — ABNORMAL HIGH (ref 3.9–10.3)
lymph#: 1 10*3/uL (ref 0.9–3.3)

## 2012-10-11 LAB — COMPREHENSIVE METABOLIC PANEL (CC13)
ALT: 13 U/L (ref 0–55)
AST: 15 U/L (ref 5–34)
Albumin: 4.1 g/dL (ref 3.5–5.0)
Alkaline Phosphatase: 66 U/L (ref 40–150)
BUN: 7.9 mg/dL (ref 7.0–26.0)
CO2: 26 mEq/L (ref 22–29)
Calcium: 9.6 mg/dL (ref 8.4–10.4)
Chloride: 103 mEq/L (ref 98–109)
Creatinine: 0.8 mg/dL (ref 0.6–1.1)
Glucose: 96 mg/dl (ref 70–140)
Potassium: 4.1 mEq/L (ref 3.5–5.1)
Sodium: 139 mEq/L (ref 136–145)
Total Bilirubin: 0.26 mg/dL (ref 0.20–1.20)
Total Protein: 7.2 g/dL (ref 6.4–8.3)

## 2012-10-11 MED ORDER — SODIUM CHLORIDE 0.9 % IV SOLN
150.0000 mg | Freq: Once | INTRAVENOUS | Status: AC
  Administered 2012-10-11: 150 mg via INTRAVENOUS
  Filled 2012-10-11: qty 5

## 2012-10-11 MED ORDER — SODIUM CHLORIDE 0.9 % IJ SOLN
10.0000 mL | INTRAMUSCULAR | Status: DC | PRN
  Administered 2012-10-11: 10 mL
  Filled 2012-10-11: qty 10

## 2012-10-11 MED ORDER — DOXORUBICIN HCL CHEMO IV INJECTION 2 MG/ML
60.0000 mg/m2 | Freq: Once | INTRAVENOUS | Status: AC
  Administered 2012-10-11: 120 mg via INTRAVENOUS
  Filled 2012-10-11: qty 60

## 2012-10-11 MED ORDER — SODIUM CHLORIDE 0.9 % IV SOLN
600.0000 mg/m2 | Freq: Once | INTRAVENOUS | Status: AC
  Administered 2012-10-11: 1200 mg via INTRAVENOUS
  Filled 2012-10-11: qty 60

## 2012-10-11 MED ORDER — DEXAMETHASONE SODIUM PHOSPHATE 20 MG/5ML IJ SOLN
12.0000 mg | Freq: Once | INTRAMUSCULAR | Status: AC
  Administered 2012-10-11: 12 mg via INTRAVENOUS

## 2012-10-11 MED ORDER — PALONOSETRON HCL INJECTION 0.25 MG/5ML
0.2500 mg | Freq: Once | INTRAVENOUS | Status: AC
Start: 2012-10-11 — End: 2012-10-11
  Administered 2012-10-11: 0.25 mg via INTRAVENOUS

## 2012-10-11 MED ORDER — SODIUM CHLORIDE 0.9 % IV SOLN
Freq: Once | INTRAVENOUS | Status: AC
  Administered 2012-10-11: 15:00:00 via INTRAVENOUS

## 2012-10-11 MED ORDER — HEPARIN SOD (PORK) LOCK FLUSH 100 UNIT/ML IV SOLN
500.0000 [IU] | Freq: Once | INTRAVENOUS | Status: AC | PRN
  Administered 2012-10-11: 500 [IU]
  Filled 2012-10-11: qty 5

## 2012-10-11 NOTE — Patient Instructions (Signed)
Cancer Center Discharge Instructions for Patients Receiving Chemotherapy  Today you received the following chemotherapy agents Adriamycin/Cytoxan To help prevent nausea and vomiting after your treatment, we encourage you to take your nausea medication as prescribed. If you develop nausea and vomiting that is not controlled by your nausea medication, call the clinic.   BELOW ARE SYMPTOMS THAT SHOULD BE REPORTED IMMEDIATELY:  *FEVER GREATER THAN 100.5 F  *CHILLS WITH OR WITHOUT FEVER  NAUSEA AND VOMITING THAT IS NOT CONTROLLED WITH YOUR NAUSEA MEDICATION  *UNUSUAL SHORTNESS OF BREATH  *UNUSUAL BRUISING OR BLEEDING  TENDERNESS IN MOUTH AND THROAT WITH OR WITHOUT PRESENCE OF ULCERS  *URINARY PROBLEMS  *BOWEL PROBLEMS  UNUSUAL RASH Items with * indicate a potential emergency and should be followed up as soon as possible.  Feel free to call the clinic you have any questions or concerns. The clinic phone number is [phone].    

## 2012-10-11 NOTE — Progress Notes (Signed)
Adriamycin pushed through free flowing normal saline.Positive blood return before, every 5 mL during and after Adriamycin push.

## 2012-10-11 NOTE — Progress Notes (Signed)
OFFICE PROGRESS NOTE  CC  [name],[name], MD [address] [place] [address] Dr. Reuel Boom Pomposini  Dr. Emelia Loron  Dr. Lurline Hare  DIAGNOSIS: 47 year old female with new diagnosis of stage II right breast cancer  STAGE:  Right breast  Stage II,T2NxMx  ER+PR-Her2Neu-  Ki-67 high   PRIOR THERAPY:  #1June 2013 had a mammogram that was normal. But there was on physical exam possibility of a cyst noted in the right breast. The mammogram was negative. In 2014 June patient noted on exam another lump in the right breast. She underwent a diagnostic mammogram on June 10 that showed a right breast nodule in the outer quadrant. She had an ultrasound performed that showed at the 9:30 o'clock position a 3.6 cm area and then added 10:00 position 1.3 cm area with a total area being anywhere between 5-6 cm. The patient went on to have a right breast biopsy performed in [place]. The pathology revealed an invasive ductal carcinoma. This is been confirmed by our pathology as well. The carcinoma and papillary features and was felt to be between a grade 1 and 2. The tumor was estrogen receptor positive strongly (100%) progesterone receptor negative HER-2/neu negative with a Ki-67 that showed a high proliferation rate.  #2 patient was seen by me on 08/22/2012 to discuss treatment options. Due to the size of the tumor I had recommended that patient proceed with neoadjuvant treatment initially consisting of chemotherapy. However due to the workup being incomplete I did recommend that she undergo MRI of the breasts as well as staging studies. When she has had. MRI of the breasts performed on 08/27/2012 revealed in the right upper quadrant irregular lobulated mass with a satellite nodule in ambulation within 2 mm at its superior aspect measured together as 4.5 x 4.0 x 3.8 cm. No lymphadenopathy was noted there was no any other area of abnormal enhancement in either breast. Patient also had PET scans  performed on 73 that revealed the primary breast cancer measuring 3.2 x 3.3 cm with SUV of 16. It was adjacent nodule along his pure medial border of the primary mass measuring 1.4 cm which was also hypermetabolic. There were no additional areas of abnormal hypermetabolism In the chest. Abdomen and pelvis showed no abnormal hypermetabolic activity within the liver pancreas adrenal glands or spleen. No hypermetabolic lymph nodes. In the skeleton no focal hypermetabolic activity to suggest skeletal metastasis.  #3 patient was also seen by Dr. Emelia Loron as well as Dr. Lurline Hare in consultation. Dr. Dwain Sarna has agreed to place a port for eventual chemotherapy. Patient also has had chemotherapy teaching class. The port will be placed on 09/12/2012.  #4. Began Neoadjuvant AC q 2 weeks on 7/18  CURRENT THERAPY: Neoadjuvant  cycle 3 of AC  INTERVAL HISTORY: Elizabeth Miles 47 y.o. female returns for followup visit today. Overall she's doing well. She is doing well. She denies any fevers chills night sweats headaches shortness of breath chest pains palpitations no myalgias and arthralgias.  MEDICAL HISTORY: Past Medical History  Diagnosis Date  . 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 GERD (gastroesophageal reflux disease)   . GERD (gastroesophageal reflux disease) 08/22/2012  . Allergy   . Complication of anesthesia     1986 ; problem waking up  . Anxiety     ALLERGIES:  is allergic to darvocet; shellfish allergy; and other.  MEDICATIONS:  Current Outpatient Prescriptions  Medication Sig Dispense Refill  . acetaminophen (  TYLENOL) 325 MG tablet Take 650 mg by mouth every 4 (four) hours as needed for pain.      . ciprofloxacin (CIPRO) 500 MG tablet Take 1 tablet (500 mg total) by mouth 2 (two) times daily.  28 tablet  5  . Dexlansoprazole (DEXILANT) 30 MG capsule Take 1 capsule (30 mg total) by mouth daily.  30 capsule  7  . hyaluronate sodium (RADIAPLEXRX) GEL Apply  1 application topically once. Apply after rad txs and bedtime,prn      . lidocaine-prilocaine (EMLA) cream Apply topically as needed.  30 g  7  . loratadine (CLARITIN) 10 MG tablet Take 10 mg by mouth as needed for allergies.      Marland Kitchen LORazepam (ATIVAN) 0.5 MG tablet       . ondansetron (ZOFRAN) 8 MG tablet Take 1 tablet (8 mg total) by mouth 2 (two) times daily as needed. Take two times a day as needed for nausea or vomiting starting on the third day after chemotherapy.  30 tablet  1  . prochlorperazine (COMPAZINE) 10 MG tablet Take 1 tablet (10 mg total) by mouth every 6 (six) hours as needed (Nausea or vomiting).  30 tablet  1  . dexamethasone (DECADRON) 4 MG tablet Take 2 tablets by mouth once a day on the day after chemotherapy and then take 2 tablets two times a day for 2 days. Take with food.  30 tablet  1  . ibuprofen (ADVIL,MOTRIN) 200 MG tablet Take 200 mg by mouth every 4 (four) hours as needed for pain.       No current facility-administered medications for this visit.    SURGICAL HISTORY:  Past Surgical History  Procedure Laterality Date  . Dilation and curettage of uterus  1993  . Cervical ablation  1983  . Breast biopsy Right 08/06/12  . Popliteal synovial cyst excision  1970  . Portacath placement Left 09/12/2012    Procedure: INSERTION PORT-A-CATH;  Surgeon: Emelia Loron, MD;  Location: [facility] OR;  Service: General;  Laterality: Left;    REVIEW OF SYSTEMS:  Pertinent items are noted in HPI.   HEALTH MAINTENANCE:   PHYSICAL EXAMINATION: Blood pressure 129/85, pulse 91, temperature 98.3 F (36.8 C), temperature source Oral, resp. rate 20, height 5\' 7"  (1.702 m), weight 190 lb 1.6 oz (86.229 kg). Body mass index is 29.77 kg/(m^2). ECOG PERFORMANCE STATUS: 1 - Symptomatic but completely ambulatory  None performed  LABORATORY DATA: Lab Results  Component Value Date   WBC 13.6* 10/11/2012   HGB 11.9 10/11/2012   HCT 35.6 10/11/2012   MCV 88.7 10/11/2012   PLT 288  10/11/2012      Chemistry      Component Value Date/Time   NA 139 10/11/2012 1328   K 4.1 10/11/2012 1328   CO2 26 10/11/2012 1328   BUN 7.9 10/11/2012 1328   CREATININE 0.8 10/11/2012 1328      Component Value Date/Time   CALCIUM 9.6 10/11/2012 1328   ALKPHOS 66 10/11/2012 1328   AST 15 10/11/2012 1328   ALT 13 10/11/2012 1328   BILITOT 0.26 10/11/2012 1328       RADIOGRAPHIC STUDIES:  Ct Chest W Contrast  08/29/2012   *RADIOLOGY REPORT*  Clinical Data:  Breast cancer.  CT CHEST, ABDOMEN AND PELVIS WITH CONTRAST  Technique:  Multidetector CT imaging of the chest, abdomen and pelvis was performed following the standard protocol during bolus administration of intravenous contrast.  Contrast: OMNIPAQUE IOHEXOL 300 MG/ML  SOLN  Comparison:  PET 08/29/2012.  CT CHEST  Findings:  No pathologically enlarged mediastinal, hilar, axillary or internal mammary lymph nodes.  Primary right breast mass measures 3.1 x 3.4 cm.  There is a smaller nodule along the superior medial margin of the primary mass, measuring 1.2 x 1.3 cm. Heart size normal.  No pericardial effusion.  Minimal biapical pleural parenchymal scarring.  An oblong 4 mm (2 x 6 mm) nodule along the left major fissure is most consistent with a subpleural lymph node.  Lungs are otherwise clear.  No pleural fluid.  Airway is unremarkable.  IMPRESSION: Right breast lesions, consistent with the given history of breast cancer, without evidence of metastatic disease.  CT ABDOMEN AND PELVIS  Findings:  Liver, gallbladder, adrenal glands, kidneys, spleen, pancreas, stomach and small bowel are unremarkable.  Appendix is normal.  A fair amount of stool is seen in the colon, indicative of constipation.  No pathologically enlarged lymph nodes.  Uterus and ovaries are visualized.  No free fluid.  No worrisome lytic or sclerotic lesions.  IMPRESSION: No evidence of metastatic disease in the abdomen or pelvis.   Original Report Authenticated By: Leanna Battles,  M.D.   Ct Abdomen Pelvis W Contrast  08/29/2012   *RADIOLOGY REPORT*  Clinical Data:  Breast cancer.  CT CHEST, ABDOMEN AND PELVIS WITH CONTRAST  Technique:  Multidetector CT imaging of the chest, abdomen and pelvis was performed following the standard protocol during bolus administration of intravenous contrast.  Contrast: OMNIPAQUE IOHEXOL 300 MG/ML  SOLN  Comparison:  PET 08/29/2012.  CT CHEST  Findings:  No pathologically enlarged mediastinal, hilar, axillary or internal mammary lymph nodes.  Primary right breast mass measures 3.1 x 3.4 cm.  There is a smaller nodule along the superior medial margin of the primary mass, measuring 1.2 x 1.3 cm. Heart size normal.  No pericardial effusion.  Minimal biapical pleural parenchymal scarring.  An oblong 4 mm (2 x 6 mm) nodule along the left major fissure is most consistent with a subpleural lymph node.  Lungs are otherwise clear.  No pleural fluid.  Airway is unremarkable.  IMPRESSION: Right breast lesions, consistent with the given history of breast cancer, without evidence of metastatic disease.  CT ABDOMEN AND PELVIS  Findings:  Liver, gallbladder, adrenal glands, kidneys, spleen, pancreas, stomach and small bowel are unremarkable.  Appendix is normal.  A fair amount of stool is seen in the colon, indicative of constipation.  No pathologically enlarged lymph nodes.  Uterus and ovaries are visualized.  No free fluid.  No worrisome lytic or sclerotic lesions.  IMPRESSION: No evidence of metastatic disease in the abdomen or pelvis.   Original Report Authenticated By: Leanna Battles, M.D.   Mr Breast Bilateral W Wo Contrast  08/27/2012   *RADIOLOGY REPORT*  Clinical Data: Newly-diagnosed right breast invasive ductal carcinoma manifesting as a palpable mass.  BILATERAL BREAST MRI WITH AND WITHOUT CONTRAST  Technique: Multiplanar, multisequence MR images of both breasts were obtained prior to and following the intravenous administration of 16ml of Multihance.   Three dimensional images were evaluated at the independent DynaCad workstation.  Comparison:  [place] mammograms 2014 and 2012.  We are not provided with post biopsy mammograms to indicate whether a clip was placed.  Findings: Background parenchymal enhancement pattern is mild. Breast parenchymal pattern suggests heterogeneously dense parenchyma.  In the right breast upper outer quadrant is an irregular lobulated mass with central T2 hyperintensity suggesting necrosis or biopsy artifact, with a satellite nodule or  lobulation within 2 mm at its superior aspect, measured together as 4.5 x 4.0 x 3.8 cm.  This demonstrates washin/washout type enhancement kinetics.  This corresponds to the reported biopsy-proven breast cancer.  Internal signal voids are noted which may indicate gas, clip artifact, or less likely calcification.  There is extension of enhancement to the nipple, suggesting nipple involvement may be present.  No other area of abnormal enhancement is seen in either breast.  No lymphadenopathy or T2-weighted hyperintensity elsewhere in either breast.  IMPRESSION: Dominant right breast upper outer quadrant irregular mass corresponding to the biopsy-proven breast cancer, measuring 4.5 cm in total and including the satellite nodule or lobulation at its superior aspect, which is within 2 mm of the dominant mass.  It is not clear from the provided images whether a clip was placed at prior biopsy.  If the patient is to undergo neoadjuvant chemotherapy, placement of a clip is highly recommended, because the mass could become occult on imaging if there is a favorable treatment response, and therefore may not be accurately localized if lumpectomy is planned.  Extension of enhancement from the mass to the right nipple may suggest nipple involvement.  No evidence for contralateral left-sided abnormal enhancement.  RECOMMENDATION: Treatment plan  THREE-DIMENSIONAL MR IMAGE RENDERING ON INDEPENDENT WORKSTATION:   Three-dimensional MR images were rendered by post-processing of the original MR data on an independent workstation.  The three- dimensional MR images were interpreted, and findings were reported in the accompanying complete MRI report for this study.  BI-RADS CATEGORY 6:  Known biopsy-proven malignancy - appropriate action should be taken.   Original Report Authenticated By: Christiana Pellant, M.D.   Nm Pet Image Initial (pi) Skull Base To Thigh  08/29/2012   *RADIOLOGY REPORT*  Clinical Data: Initial treatment strategy for breast cancer.  NUCLEAR MEDICINE PET SKULL BASE TO THIGH  Fasting Blood Glucose:  88  Technique:  17.6 mCi F-18 FDG was injected intravenously. CT data was obtained and used for attenuation correction and anatomic localization only.  (This was not acquired as a diagnostic CT examination.) Additional exam technical data entered on technologist worksheet.  Comparison:  CT chest abdomen pelvis 08/29/2012.  Findings:  Neck: No hypermetabolic lymph nodes in the neck.  Hypermetabolic brown fat is noted.  CT images show no acute findings.  Chest:  No hypermetabolic mediastinal or hilar lymph nodes.  No suspicious pulmonary nodules.  There is hypermetabolic brown fat.  Primary right breast mass measures 3.2 x 3.3 cm with an S U V max of 16.0.  There is an adjacent nodule along the superomedial border of the primary mass, measuring 1.4 cm, also hypermetabolic.  No additional areas of abnormal hypermetabolism in the chest.  CT images show no acute findings.  Specifically, no pericardial or pleural effusion.  Abdomen/Pelvis:  No abnormal hypermetabolic activity within the liver, pancreas, adrenal glands or spleen.  No hypermetabolic lymph nodes.  CT images show no acute findings.  Skeleton:  No focal hypermetabolic activity to suggest skeletal metastasis.  IMPRESSION: Hypermetabolic right breast lesions, consistent with the given history breast cancer, without evidence of metastatic disease.   Original  Report Authenticated By: Leanna Battles, M.D.   Dg Chest Port 1 View  09/12/2012   *RADIOLOGY REPORT*  Clinical Data: Port-A-Cath placement  PORTABLE CHEST - 1 VIEW  Comparison: CT chest 08/29/2012  Findings: Left subclavian Port-A-Cath tip in the mid SVC.  No pneumothorax  The lungs are clear.  IMPRESSION: Satisfactory Port-A-Cath placement.   Original Report  Authenticated By: Janeece Riggers, M.D.   Dg Fluoro Guide Cv Line-no Report  09/12/2012   CLINICAL DATA: right breast mass   FLOURO GUIDE CV LINE  Fluoroscopy was utilized by the requesting physician.  No radiographic  interpretation.     ASSESSMENT: 47 year old female with  #1 T2 NX MX invasive ductal carcinoma of the breast found a self breast examination of the right breast. She is a good candidate for neoadjuvant chemotherapy. We discussed treatment with Adriamycin Cytoxan followed by Taxol. She will receive dose dense a.c. for 4 cycles followed by Taxol weekly for 12 weeks. She is scheduled to have a Port-A-Cath placed next week by Dr. Emelia Loron. She desires to begin treatment as soon as possible.  #2 we discussed her CT scan results and PET scan results as well as her MRI results today.  #3 patient has begun neoadjuvant chemotherapy with AC on 7/18. Total 4 cycles planned, after completion of AC she will begin single agent taxol weekly x 12 weeks  #4. Neutropenia due to chemotherapy resolve  #5 perirectal lesion resolve   PLAN:  #1 patient will proceed with cycle #3 of Adriamycin and Cytoxan today.  #2 she'll return tomorrow for Neulasta injection.  #3 she'll be seen back by Mardella Layman in one week for followup and blood work.  All questions were answered. The patient knows to call the clinic with any problems, questions or concerns. We can certainly see the patient much sooner if necessary.  I spent 25 minutes counseling the patient face to face. The total time spent in the appointment was 30 minutes.    Drue Second,  MD Medical/Oncology [facility] [phone] (beeper) [phone] (Office)

## 2012-10-12 ENCOUNTER — Ambulatory Visit (HOSPITAL_BASED_OUTPATIENT_CLINIC_OR_DEPARTMENT_OTHER): Payer: BC Managed Care – PPO

## 2012-10-12 VITALS — BP 113/79 | HR 97 | Temp 97.7°F | Resp 98

## 2012-10-12 DIAGNOSIS — C50919 Malignant neoplasm of unspecified site of unspecified female breast: Secondary | ICD-10-CM

## 2012-10-12 DIAGNOSIS — C50911 Malignant neoplasm of unspecified site of right female breast: Secondary | ICD-10-CM

## 2012-10-12 DIAGNOSIS — D709 Neutropenia, unspecified: Secondary | ICD-10-CM

## 2012-10-12 MED ORDER — PEGFILGRASTIM INJECTION 6 MG/0.6ML
6.0000 mg | Freq: Once | SUBCUTANEOUS | Status: AC
  Administered 2012-10-12: 6 mg via SUBCUTANEOUS

## 2012-10-14 ENCOUNTER — Encounter: Payer: BC Managed Care – PPO | Admitting: Genetic Counselor

## 2012-10-15 ENCOUNTER — Telehealth: Payer: Self-pay | Admitting: Oncology

## 2012-10-18 ENCOUNTER — Encounter: Payer: Self-pay | Admitting: Adult Health

## 2012-10-18 ENCOUNTER — Ambulatory Visit (HOSPITAL_BASED_OUTPATIENT_CLINIC_OR_DEPARTMENT_OTHER): Payer: BC Managed Care – PPO | Admitting: Adult Health

## 2012-10-18 ENCOUNTER — Telehealth: Payer: Self-pay

## 2012-10-18 ENCOUNTER — Other Ambulatory Visit (HOSPITAL_BASED_OUTPATIENT_CLINIC_OR_DEPARTMENT_OTHER): Payer: BC Managed Care – PPO

## 2012-10-18 VITALS — BP 105/72 | HR 94 | Temp 98.3°F | Resp 18 | Ht 67.0 in | Wt 189.5 lb

## 2012-10-18 DIAGNOSIS — C50919 Malignant neoplasm of unspecified site of unspecified female breast: Secondary | ICD-10-CM

## 2012-10-18 DIAGNOSIS — C50419 Malignant neoplasm of upper-outer quadrant of unspecified female breast: Secondary | ICD-10-CM

## 2012-10-18 LAB — CBC WITH DIFFERENTIAL/PLATELET
Basophils Absolute: 0.1 10*3/uL (ref 0.0–0.1)
EOS%: 0.4 % (ref 0.0–7.0)
HCT: 30.5 % — ABNORMAL LOW (ref 34.8–46.6)
HGB: 10 g/dL — ABNORMAL LOW (ref 11.6–15.9)
MCH: 29 pg (ref 25.1–34.0)
MONO#: 0.1 10*3/uL (ref 0.1–0.9)
NEUT#: 1.8 10*3/uL (ref 1.5–6.5)
NEUT%: 75.1 % (ref 38.4–76.8)
RDW: 13.4 % (ref 11.2–14.5)
WBC: 2.4 10*3/uL — ABNORMAL LOW (ref 3.9–10.3)
lymph#: 0.4 10*3/uL — ABNORMAL LOW (ref 0.9–3.3)

## 2012-10-18 LAB — COMPREHENSIVE METABOLIC PANEL (CC13)
ALT: 9 U/L (ref 0–55)
AST: 10 U/L (ref 5–34)
Albumin: 3.8 g/dL (ref 3.5–5.0)
BUN: 12.6 mg/dL (ref 7.0–26.0)
CO2: 26 mEq/L (ref 22–29)
Calcium: 9.2 mg/dL (ref 8.4–10.4)
Chloride: 105 mEq/L (ref 98–109)
Potassium: 4.1 mEq/L (ref 3.5–5.1)

## 2012-10-18 NOTE — Patient Instructions (Signed)
Doing well.  Labs are stable.  We will see you next week before your chemotherapy.  Please call us if you have any questions or concerns.

## 2012-10-18 NOTE — Progress Notes (Signed)
OFFICE PROGRESS NOTE  CC  [name],[name], MD [address] [place] [address] Dr. Reuel Boom Pomposini  Dr. Emelia Loron  Dr. Lurline Hare  DIAGNOSIS: 47 year old female with new diagnosis of stage II right breast cancer  STAGE:  Right breast  Stage II,T2NxMx  ER+PR-Her2Neu-  Ki-67 high   PRIOR THERAPY:  #1June 2013 had a mammogram that was normal. But there was on physical exam possibility of a cyst noted in the right breast. The mammogram was negative. In 2014 June patient noted on exam another lump in the right breast. She underwent a diagnostic mammogram on June 10 that showed a right breast nodule in the outer quadrant. She had an ultrasound performed that showed at the 9:30 o'clock position a 3.6 cm area and then added 10:00 position 1.3 cm area with a total area being anywhere between 5-6 cm. The patient went on to have a right breast biopsy performed in [place]. The pathology revealed an invasive ductal carcinoma. This is been confirmed by our pathology as well. The carcinoma and papillary features and was felt to be between a grade 1 and 2. The tumor was estrogen receptor positive strongly (100%) progesterone receptor negative HER-2/neu negative with a Ki-67 that showed a high proliferation rate.  #2 patient was seen by me on 08/22/2012 to discuss treatment options. Due to the size of the tumor I had recommended that patient proceed with neoadjuvant treatment initially consisting of chemotherapy. However due to the workup being incomplete I did recommend that she undergo MRI of the breasts as well as staging studies. When she has had. MRI of the breasts performed on 08/27/2012 revealed in the right upper quadrant irregular lobulated mass with a satellite nodule in ambulation within 2 mm at its superior aspect measured together as 4.5 x 4.0 x 3.8 cm. No lymphadenopathy was noted there was no any other area of abnormal enhancement in either breast. Patient also had PET scans  performed on 73 that revealed the primary breast cancer measuring 3.2 x 3.3 cm with SUV of 16. It was adjacent nodule along his pure medial border of the primary mass measuring 1.4 cm which was also hypermetabolic. There were no additional areas of abnormal hypermetabolism In the chest. Abdomen and pelvis showed no abnormal hypermetabolic activity within the liver pancreas adrenal glands or spleen. No hypermetabolic lymph nodes. In the skeleton no focal hypermetabolic activity to suggest skeletal metastasis.  #3 patient was also seen by Dr. Emelia Loron as well as Dr. Lurline Hare in consultation. Dr. Dwain Sarna has agreed to place a port for eventual chemotherapy. Patient also has had chemotherapy teaching class. The port will be placed on 09/12/2012.  #4. Began Neoadjuvant AC q 2 weeks on 7/18  CURRENT THERAPY: Neoadjuvant  cycle 3 day 8 of AC  INTERVAL HISTORY: GRANT HENKES 47 y.o. female returns for evaluation following the third cycle of adriamycin/cytoxan. She is very fatigued today.  She does have neck/shoulder pain from the neulasta that she takes claritin for and it helps.  At her last appointment she had a skin lesion on her back that she was started on Cipro for by Dr. Welton Flakes.  She has continued to take that through chemotherapy and today.  She denies fevers, chills, nausea, vomiting, constipation, diarrhea, numbness or any other concerns.    MEDICAL HISTORY: Past Medical History  Diagnosis Date  . 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 GERD (gastroesophageal reflux disease)   . GERD (gastroesophageal  reflux disease) 08/22/2012  . Allergy   . Complication of anesthesia     1986 ; problem waking up  . Anxiety     ALLERGIES:  is allergic to darvocet; shellfish allergy; and other.  MEDICATIONS:  Current Outpatient Prescriptions  Medication Sig Dispense Refill  . acetaminophen (TYLENOL) 325 MG tablet Take 650 mg by mouth every 4 (four) hours as needed for pain.       . ciprofloxacin (CIPRO) 500 MG tablet Take 1 tablet (500 mg total) by mouth 2 (two) times daily.  28 tablet  5  . dexamethasone (DECADRON) 4 MG tablet Take 2 tablets by mouth once a day on the day after chemotherapy and then take 2 tablets two times a day for 2 days. Take with food.  30 tablet  1  . Dexlansoprazole (DEXILANT) 30 MG capsule Take 1 capsule (30 mg total) by mouth daily.  30 capsule  7  . hyaluronate sodium (RADIAPLEXRX) GEL Apply 1 application topically once. Apply after rad txs and bedtime,prn      . ibuprofen (ADVIL,MOTRIN) 200 MG tablet Take 200 mg by mouth every 4 (four) hours as needed for pain.      Marland Kitchen lidocaine-prilocaine (EMLA) cream Apply topically as needed.  30 g  7  . loratadine (CLARITIN) 10 MG tablet Take 10 mg by mouth as needed for allergies.      Marland Kitchen LORazepam (ATIVAN) 0.5 MG tablet       . ondansetron (ZOFRAN) 8 MG tablet Take 1 tablet (8 mg total) by mouth 2 (two) times daily as needed. Take two times a day as needed for nausea or vomiting starting on the third day after chemotherapy.  30 tablet  1  . prochlorperazine (COMPAZINE) 10 MG tablet Take 1 tablet (10 mg total) by mouth every 6 (six) hours as needed (Nausea or vomiting).  30 tablet  1   No current facility-administered medications for this visit.    SURGICAL HISTORY:  Past Surgical History  Procedure Laterality Date  . Dilation and curettage of uterus  1993  . Cervical ablation  1983  . Breast biopsy Right 08/06/12  . Popliteal synovial cyst excision  1970  . Portacath placement Left 09/12/2012    Procedure: INSERTION PORT-A-CATH;  Surgeon: Emelia Loron, MD;  Location: [facility] OR;  Service: General;  Laterality: Left;    REVIEW OF SYSTEMS:  Pertinent items are noted in HPI.   HEALTH MAINTENANCE:   PHYSICAL EXAMINATION: Blood pressure 105/72, pulse 94, temperature 98.3 F (36.8 C), temperature source Oral, resp. rate 18, height 5\' 7"  (1.702 m), weight 189 lb 8 oz (85.957 kg). Body mass index  is 29.67 kg/(m^2). General: Patient is a well appearing female in no acute distress HEENT: PERRLA, sclerae anicteric no conjunctival pallor, MMM Neck: supple, no palpable adenopathy Lungs: clear to auscultation bilaterally, no wheezes, rhonchi, or rales Cardiovascular: regular rate rhythm, S1, S2, no murmurs, rubs or gallops Abdomen: Soft, non-tender, non-distended, normoactive bowel sounds, no HSM Extremities: warm and well perfused, no clubbing, cyanosis, or edema Skin: No rashes or lesions Neuro: Non-focal ECOG PERFORMANCE STATUS: 1 - Symptomatic but completely ambulatory  None performed  LABORATORY DATA: Lab Results  Component Value Date   WBC 2.4* 10/18/2012   HGB 10.0* 10/18/2012   HCT 30.5* 10/18/2012   MCV 88.4 10/18/2012   PLT 215 10/18/2012      Chemistry      Component Value Date/Time   NA 139 10/11/2012 1328   K 4.1 10/11/2012 1328  CO2 26 10/11/2012 1328   BUN 7.9 10/11/2012 1328   CREATININE 0.8 10/11/2012 1328      Component Value Date/Time   CALCIUM 9.6 10/11/2012 1328   ALKPHOS 66 10/11/2012 1328   AST 15 10/11/2012 1328   ALT 13 10/11/2012 1328   BILITOT 0.26 10/11/2012 1328       RADIOGRAPHIC STUDIES:  Ct Chest W Contrast  08/29/2012   *RADIOLOGY REPORT*  Clinical Data:  Breast cancer.  CT CHEST, ABDOMEN AND PELVIS WITH CONTRAST  Technique:  Multidetector CT imaging of the chest, abdomen and pelvis was performed following the standard protocol during bolus administration of intravenous contrast.  Contrast: OMNIPAQUE IOHEXOL 300 MG/ML  SOLN  Comparison:  PET 08/29/2012.  CT CHEST  Findings:  No pathologically enlarged mediastinal, hilar, axillary or internal mammary lymph nodes.  Primary right breast mass measures 3.1 x 3.4 cm.  There is a smaller nodule along the superior medial margin of the primary mass, measuring 1.2 x 1.3 cm. Heart size normal.  No pericardial effusion.  Minimal biapical pleural parenchymal scarring.  An oblong 4 mm (2 x 6 mm) nodule along  the left major fissure is most consistent with a subpleural lymph node.  Lungs are otherwise clear.  No pleural fluid.  Airway is unremarkable.  IMPRESSION: Right breast lesions, consistent with the given history of breast cancer, without evidence of metastatic disease.  CT ABDOMEN AND PELVIS  Findings:  Liver, gallbladder, adrenal glands, kidneys, spleen, pancreas, stomach and small bowel are unremarkable.  Appendix is normal.  A fair amount of stool is seen in the colon, indicative of constipation.  No pathologically enlarged lymph nodes.  Uterus and ovaries are visualized.  No free fluid.  No worrisome lytic or sclerotic lesions.  IMPRESSION: No evidence of metastatic disease in the abdomen or pelvis.   Original Report Authenticated By: Leanna Battles, M.D.   Ct Abdomen Pelvis W Contrast  08/29/2012   *RADIOLOGY REPORT*  Clinical Data:  Breast cancer.  CT CHEST, ABDOMEN AND PELVIS WITH CONTRAST  Technique:  Multidetector CT imaging of the chest, abdomen and pelvis was performed following the standard protocol during bolus administration of intravenous contrast.  Contrast: OMNIPAQUE IOHEXOL 300 MG/ML  SOLN  Comparison:  PET 08/29/2012.  CT CHEST  Findings:  No pathologically enlarged mediastinal, hilar, axillary or internal mammary lymph nodes.  Primary right breast mass measures 3.1 x 3.4 cm.  There is a smaller nodule along the superior medial margin of the primary mass, measuring 1.2 x 1.3 cm. Heart size normal.  No pericardial effusion.  Minimal biapical pleural parenchymal scarring.  An oblong 4 mm (2 x 6 mm) nodule along the left major fissure is most consistent with a subpleural lymph node.  Lungs are otherwise clear.  No pleural fluid.  Airway is unremarkable.  IMPRESSION: Right breast lesions, consistent with the given history of breast cancer, without evidence of metastatic disease.  CT ABDOMEN AND PELVIS  Findings:  Liver, gallbladder, adrenal glands, kidneys, spleen, pancreas, stomach and small  bowel are unremarkable.  Appendix is normal.  A fair amount of stool is seen in the colon, indicative of constipation.  No pathologically enlarged lymph nodes.  Uterus and ovaries are visualized.  No free fluid.  No worrisome lytic or sclerotic lesions.  IMPRESSION: No evidence of metastatic disease in the abdomen or pelvis.   Original Report Authenticated By: Leanna Battles, M.D.   Mr Breast Bilateral W Wo Contrast  08/27/2012   *RADIOLOGY REPORT*  Clinical Data: Newly-diagnosed right breast invasive ductal carcinoma manifesting as a palpable mass.  BILATERAL BREAST MRI WITH AND WITHOUT CONTRAST  Technique: Multiplanar, multisequence MR images of both breasts were obtained prior to and following the intravenous administration of 16ml of Multihance.  Three dimensional images were evaluated at the independent DynaCad workstation.  Comparison:  [place] mammograms 2014 and 2012.  We are not provided with post biopsy mammograms to indicate whether a clip was placed.  Findings: Background parenchymal enhancement pattern is mild. Breast parenchymal pattern suggests heterogeneously dense parenchyma.  In the right breast upper outer quadrant is an irregular lobulated mass with central T2 hyperintensity suggesting necrosis or biopsy artifact, with a satellite nodule or lobulation within 2 mm at its superior aspect, measured together as 4.5 x 4.0 x 3.8 cm.  This demonstrates washin/washout type enhancement kinetics.  This corresponds to the reported biopsy-proven breast cancer.  Internal signal voids are noted which may indicate gas, clip artifact, or less likely calcification.  There is extension of enhancement to the nipple, suggesting nipple involvement may be present.  No other area of abnormal enhancement is seen in either breast.  No lymphadenopathy or T2-weighted hyperintensity elsewhere in either breast.  IMPRESSION: Dominant right breast upper outer quadrant irregular mass corresponding to the biopsy-proven breast  cancer, measuring 4.5 cm in total and including the satellite nodule or lobulation at its superior aspect, which is within 2 mm of the dominant mass.  It is not clear from the provided images whether a clip was placed at prior biopsy.  If the patient is to undergo neoadjuvant chemotherapy, placement of a clip is highly recommended, because the mass could become occult on imaging if there is a favorable treatment response, and therefore may not be accurately localized if lumpectomy is planned.  Extension of enhancement from the mass to the right nipple may suggest nipple involvement.  No evidence for contralateral left-sided abnormal enhancement.  RECOMMENDATION: Treatment plan  THREE-DIMENSIONAL MR IMAGE RENDERING ON INDEPENDENT WORKSTATION:  Three-dimensional MR images were rendered by post-processing of the original MR data on an independent workstation.  The three- dimensional MR images were interpreted, and findings were reported in the accompanying complete MRI report for this study.  BI-RADS CATEGORY 6:  Known biopsy-proven malignancy - appropriate action should be taken.   Original Report Authenticated By: Christiana Pellant, M.D.   Nm Pet Image Initial (pi) Skull Base To Thigh  08/29/2012   *RADIOLOGY REPORT*  Clinical Data: Initial treatment strategy for breast cancer.  NUCLEAR MEDICINE PET SKULL BASE TO THIGH  Fasting Blood Glucose:  88  Technique:  17.6 mCi F-18 FDG was injected intravenously. CT data was obtained and used for attenuation correction and anatomic localization only.  (This was not acquired as a diagnostic CT examination.) Additional exam technical data entered on technologist worksheet.  Comparison:  CT chest abdomen pelvis 08/29/2012.  Findings:  Neck: No hypermetabolic lymph nodes in the neck.  Hypermetabolic brown fat is noted.  CT images show no acute findings.  Chest:  No hypermetabolic mediastinal or hilar lymph nodes.  No suspicious pulmonary nodules.  There is hypermetabolic brown  fat.  Primary right breast mass measures 3.2 x 3.3 cm with an S U V max of 16.0.  There is an adjacent nodule along the superomedial border of the primary mass, measuring 1.4 cm, also hypermetabolic.  No additional areas of abnormal hypermetabolism in the chest.  CT images show no acute findings.  Specifically, no pericardial or pleural effusion.  Abdomen/Pelvis:  No abnormal hypermetabolic activity within the liver, pancreas, adrenal glands or spleen.  No hypermetabolic lymph nodes.  CT images show no acute findings.  Skeleton:  No focal hypermetabolic activity to suggest skeletal metastasis.  IMPRESSION: Hypermetabolic right breast lesions, consistent with the given history breast cancer, without evidence of metastatic disease.   Original Report Authenticated By: Leanna Battles, M.D.   Dg Chest Port 1 View  09/12/2012   *RADIOLOGY REPORT*  Clinical Data: Port-A-Cath placement  PORTABLE CHEST - 1 VIEW  Comparison: CT chest 08/29/2012  Findings: Left subclavian Port-A-Cath tip in the mid SVC.  No pneumothorax  The lungs are clear.  IMPRESSION: Satisfactory Port-A-Cath placement.   Original Report Authenticated By: Janeece Riggers, M.D.   Dg Fluoro Guide Cv Line-no Report  09/12/2012   CLINICAL DATA: right breast mass   FLOURO GUIDE CV LINE  Fluoroscopy was utilized by the requesting physician.  No radiographic  interpretation.     ASSESSMENT: 47 year old female with  #1 T2 NX MX invasive ductal carcinoma of the breast found a self breast examination of the right breast. She is a good candidate for neoadjuvant chemotherapy. We discussed treatment with Adriamycin Cytoxan followed by Taxol. She will receive dose dense a.c. for 4 cycles followed by Taxol weekly for 12 weeks. She is scheduled to have a Port-A-Cath placed next week by Dr. Emelia Loron. She desires to begin treatment as soon as possible.  #2 we discussed her CT scan results and PET scan results as well as her MRI results today.  #3 patient  has begun neoadjuvant chemotherapy with AC on 7/18. Total 4 cycles planned, after completion of AC she will begin single agent taxol weekly x 12 weeks   PLAN:  #1 Doing well.  Labs are stable.  Patient is not neutropenic today.  She will complete her current antibiotic course as prescribed by Dr. Welton Flakes.    #2 She will return in one week for labs and an appointment with Dr. Welton Flakes.    All questions were answered. The patient knows to call the clinic with any problems, questions or concerns. We can certainly see the patient much sooner if necessary.  I spent 25 minutes counseling the patient face to face. The total time spent in the appointment was 30 minutes.  Cherie Ouch Lyn Hollingshead, NP Medical Oncology [facility] Phone: [phone]

## 2012-10-21 ENCOUNTER — Telehealth: Payer: Self-pay | Admitting: *Deleted

## 2012-10-21 NOTE — Telephone Encounter (Signed)
Per staff message and POF I have adjusted 8/29 appt. I tried to schedule 9/12 teatment, but MD visit to late in the day for first time treatment. Scheduler notified to advise new appt.  JMW

## 2012-10-22 ENCOUNTER — Telehealth: Payer: Self-pay | Admitting: *Deleted

## 2012-10-22 ENCOUNTER — Telehealth: Payer: Self-pay | Admitting: Oncology

## 2012-10-22 NOTE — Telephone Encounter (Signed)
Per staff message and POF I have scheduled appts.  JMW  

## 2012-10-22 NOTE — Telephone Encounter (Signed)
, °

## 2012-10-25 ENCOUNTER — Ambulatory Visit (HOSPITAL_BASED_OUTPATIENT_CLINIC_OR_DEPARTMENT_OTHER): Payer: BC Managed Care – PPO | Admitting: Adult Health

## 2012-10-25 ENCOUNTER — Other Ambulatory Visit: Payer: BC Managed Care – PPO | Admitting: Lab

## 2012-10-25 ENCOUNTER — Ambulatory Visit (HOSPITAL_BASED_OUTPATIENT_CLINIC_OR_DEPARTMENT_OTHER): Payer: BC Managed Care – PPO

## 2012-10-25 ENCOUNTER — Other Ambulatory Visit (HOSPITAL_BASED_OUTPATIENT_CLINIC_OR_DEPARTMENT_OTHER): Payer: BC Managed Care – PPO | Admitting: Lab

## 2012-10-25 ENCOUNTER — Encounter: Payer: Self-pay | Admitting: Adult Health

## 2012-10-25 VITALS — BP 112/75 | HR 98 | Temp 98.2°F | Resp 20 | Wt 188.8 lb

## 2012-10-25 DIAGNOSIS — C50419 Malignant neoplasm of upper-outer quadrant of unspecified female breast: Secondary | ICD-10-CM

## 2012-10-25 DIAGNOSIS — C50919 Malignant neoplasm of unspecified site of unspecified female breast: Secondary | ICD-10-CM

## 2012-10-25 DIAGNOSIS — C50911 Malignant neoplasm of unspecified site of right female breast: Secondary | ICD-10-CM

## 2012-10-25 DIAGNOSIS — K649 Unspecified hemorrhoids: Secondary | ICD-10-CM

## 2012-10-25 DIAGNOSIS — Z170 Estrogen receptor positive status [ER+]: Secondary | ICD-10-CM

## 2012-10-25 DIAGNOSIS — R05 Cough: Secondary | ICD-10-CM

## 2012-10-25 DIAGNOSIS — Z5111 Encounter for antineoplastic chemotherapy: Secondary | ICD-10-CM

## 2012-10-25 DIAGNOSIS — J069 Acute upper respiratory infection, unspecified: Secondary | ICD-10-CM

## 2012-10-25 DIAGNOSIS — K5900 Constipation, unspecified: Secondary | ICD-10-CM

## 2012-10-25 LAB — COMPREHENSIVE METABOLIC PANEL (CC13)
ALT: 12 U/L (ref 0–55)
AST: 14 U/L (ref 5–34)
Alkaline Phosphatase: 78 U/L (ref 40–150)
BUN: 11.3 mg/dL (ref 7.0–26.0)
Calcium: 9.9 mg/dL (ref 8.4–10.4)
Creatinine: 0.7 mg/dL (ref 0.6–1.1)
Total Bilirubin: 0.25 mg/dL (ref 0.20–1.20)

## 2012-10-25 LAB — CBC WITH DIFFERENTIAL/PLATELET
BASO%: 1.7 % (ref 0.0–2.0)
EOS%: 0.2 % (ref 0.0–7.0)
HCT: 34.5 % — ABNORMAL LOW (ref 34.8–46.6)
LYMPH%: 4 % — ABNORMAL LOW (ref 14.0–49.7)
MCH: 30.1 pg (ref 25.1–34.0)
MCHC: 33.3 g/dL (ref 31.5–36.0)
MONO#: 1.7 10*3/uL — ABNORMAL HIGH (ref 0.1–0.9)
MONO%: 10.5 % (ref 0.0–14.0)
NEUT%: 83.6 % — ABNORMAL HIGH (ref 38.4–76.8)
Platelets: 257 10*3/uL (ref 145–400)
RBC: 3.82 10*6/uL (ref 3.70–5.45)
WBC: 16.3 10*3/uL — ABNORMAL HIGH (ref 3.9–10.3)
nRBC: 1 % — ABNORMAL HIGH (ref 0–0)

## 2012-10-25 MED ORDER — HYDROCORTISONE ACE-PRAMOXINE 1-1 % RE FOAM: 1.0000 | Freq: Two times a day (BID) | RECTAL | Status: DC

## 2012-10-25 MED ORDER — SODIUM CHLORIDE 0.9 % IV SOLN
600.0000 mg/m2 | Freq: Once | INTRAVENOUS | Status: AC
  Administered 2012-10-25: 1200 mg via INTRAVENOUS
  Filled 2012-10-25: qty 60

## 2012-10-25 MED ORDER — AZITHROMYCIN 250 MG PO TABS: ORAL_TABLET | ORAL | Status: DC

## 2012-10-25 MED ORDER — SODIUM CHLORIDE 0.9 % IJ SOLN
10.0000 mL | INTRAMUSCULAR | Status: DC | PRN
  Administered 2012-10-25: 10 mL
  Filled 2012-10-25: qty 10

## 2012-10-25 MED ORDER — DOXORUBICIN HCL CHEMO IV INJECTION 2 MG/ML
60.0000 mg/m2 | Freq: Once | INTRAVENOUS | Status: AC
  Administered 2012-10-25: 120 mg via INTRAVENOUS
  Filled 2012-10-25: qty 60

## 2012-10-25 MED ORDER — SODIUM CHLORIDE 0.9 % IV SOLN
Freq: Once | INTRAVENOUS | Status: AC
  Administered 2012-10-25: 14:00:00 via INTRAVENOUS

## 2012-10-25 MED ORDER — SODIUM CHLORIDE 0.9 % IV SOLN
150.0000 mg | Freq: Once | INTRAVENOUS | Status: AC
  Administered 2012-10-25: 150 mg via INTRAVENOUS
  Filled 2012-10-25: qty 5

## 2012-10-25 MED ORDER — HEPARIN SOD (PORK) LOCK FLUSH 100 UNIT/ML IV SOLN
500.0000 [IU] | Freq: Once | INTRAVENOUS | Status: AC | PRN
  Administered 2012-10-25: 500 [IU]
  Filled 2012-10-25: qty 5

## 2012-10-25 MED ORDER — DEXAMETHASONE SODIUM PHOSPHATE 20 MG/5ML IJ SOLN
12.0000 mg | Freq: Once | INTRAMUSCULAR | Status: AC
  Administered 2012-10-25: 12 mg via INTRAVENOUS

## 2012-10-25 MED ORDER — TRIAMCINOLONE ACETONIDE 0.1 % EX CREA: TOPICAL_CREAM | Freq: Two times a day (BID) | CUTANEOUS | Status: DC

## 2012-10-25 MED ORDER — PALONOSETRON HCL INJECTION 0.25 MG/5ML
0.2500 mg | Freq: Once | INTRAVENOUS | Status: AC
  Administered 2012-10-25: 0.25 mg via INTRAVENOUS

## 2012-10-25 NOTE — Patient Instructions (Addendum)
Doing well.  Proceed with chemotherapy.  Please call us if you have any questions or concerns.    

## 2012-10-25 NOTE — Addendum Note (Signed)
Addended by: Augustin Schooling C on: 10/25/2012 04:59 PM   Modules accepted: Orders

## 2012-10-25 NOTE — Patient Instructions (Addendum)
[facility] Discharge Instructions for Patients Receiving Chemotherapy  Today you received the following chemotherapy agents Adriamycin and Cytoxan.  To help prevent nausea and vomiting after your treatment, we encourage you to take your nausea medication as prescribed.   If you develop nausea and vomiting that is not controlled by your nausea medication, call the clinic.   BELOW ARE SYMPTOMS THAT SHOULD BE REPORTED IMMEDIATELY:  *FEVER GREATER THAN 100.5 F  *CHILLS WITH OR WITHOUT FEVER  NAUSEA AND VOMITING THAT IS NOT CONTROLLED WITH YOUR NAUSEA MEDICATION  *UNUSUAL SHORTNESS OF BREATH  *UNUSUAL BRUISING OR BLEEDING  TENDERNESS IN MOUTH AND THROAT WITH OR WITHOUT PRESENCE OF ULCERS  *URINARY PROBLEMS  *BOWEL PROBLEMS  UNUSUAL RASH Items with * indicate a potential emergency and should be followed up as soon as possible.  Feel free to call the clinic you have any questions or concerns. The clinic phone number is [phone].    

## 2012-10-25 NOTE — Progress Notes (Signed)
OFFICE PROGRESS NOTE  CC  [name],[name], MD [address] [place] [address] Dr. Reuel Boom Pomposini  Dr. Emelia Loron  Dr. Lurline Hare  DIAGNOSIS: 47 year old female with new diagnosis of stage II right breast cancer  STAGE:  Right breast  Stage II,T2NxMx  ER+PR-Her2Neu-  Ki-67 high   PRIOR THERAPY:  #1June 2013 had a mammogram that was normal. But there was on physical exam possibility of a cyst noted in the right breast. The mammogram was negative. In 2014 June patient noted on exam another lump in the right breast. She underwent a diagnostic mammogram on June 10 that showed a right breast nodule in the outer quadrant. She had an ultrasound performed that showed at the 9:30 o'clock position a 3.6 cm area and then added 10:00 position 1.3 cm area with a total area being anywhere between 5-6 cm. The patient went on to have a right breast biopsy performed in [place]. The pathology revealed an invasive ductal carcinoma. This is been confirmed by our pathology as well. The carcinoma and papillary features and was felt to be between a grade 1 and 2. The tumor was estrogen receptor positive strongly (100%) progesterone receptor negative HER-2/neu negative with a Ki-67 that showed a high proliferation rate.  #2 patient was seen by me on 08/22/2012 to discuss treatment options. Due to the size of the tumor I had recommended that patient proceed with neoadjuvant treatment initially consisting of chemotherapy. However due to the workup being incomplete I did recommend that she undergo MRI of the breasts as well as staging studies. When she has had. MRI of the breasts performed on 08/27/2012 revealed in the right upper quadrant irregular lobulated mass with a satellite nodule in ambulation within 2 mm at its superior aspect measured together as 4.5 x 4.0 x 3.8 cm. No lymphadenopathy was noted there was no any other area of abnormal enhancement in either breast. Patient also had PET scans  performed on 73 that revealed the primary breast cancer measuring 3.2 x 3.3 cm with SUV of 16. It was adjacent nodule along his pure medial border of the primary mass measuring 1.4 cm which was also hypermetabolic. There were no additional areas of abnormal hypermetabolism In the chest. Abdomen and pelvis showed no abnormal hypermetabolic activity within the liver pancreas adrenal glands or spleen. No hypermetabolic lymph nodes. In the skeleton no focal hypermetabolic activity to suggest skeletal metastasis.  #3 patient was also seen by Dr. Emelia Loron as well as Dr. Lurline Hare in consultation. Dr. Dwain Sarna has agreed to place a port for eventual chemotherapy. Patient also has had chemotherapy teaching class. The port will be placed on 09/12/2012.  #4. Began Neoadjuvant AC q 2 weeks on 7/18  CURRENT THERAPY: Neoadjuvant  cycle 4 day 1 of AC  INTERVAL HISTORY: Elizabeth Miles 47 y.o. female returns for evaluation.  She has had a scratchy throat and runny nose for 3 days.  She denies fevers, chills, sinus pain/pressure, she has a mild cough, no shortness of breath, pleurisy, or any other concerns.  She is constipated and is taking a stool softener and prunes and subsequently has some bleeding hemorrhoids.  Otherwise, a 10 point ROS is neg.   MEDICAL HISTORY: Past Medical History  Diagnosis Date  . 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 GERD (gastroesophageal reflux disease)   . GERD (gastroesophageal reflux disease) 08/22/2012  . Allergy   . Complication of anesthesia     1986 ;  problem waking up  . Anxiety     ALLERGIES:  is allergic to darvocet; shellfish allergy; and other.  MEDICATIONS:  Current Outpatient Prescriptions  Medication Sig Dispense Refill  . acetaminophen (TYLENOL) 325 MG tablet Take 650 mg by mouth every 4 (four) hours as needed for pain.      . ciprofloxacin (CIPRO) 500 MG tablet Take 1 tablet (500 mg total) by mouth 2 (two) times daily.  28  tablet  5  . dexamethasone (DECADRON) 4 MG tablet Take 2 tablets by mouth once a day on the day after chemotherapy and then take 2 tablets two times a day for 2 days. Take with food.  30 tablet  1  . Dexlansoprazole (DEXILANT) 30 MG capsule Take 1 capsule (30 mg total) by mouth daily.  30 capsule  7  . hyaluronate sodium (RADIAPLEXRX) GEL Apply 1 application topically once. Apply after rad txs and bedtime,prn      . ibuprofen (ADVIL,MOTRIN) 200 MG tablet Take 200 mg by mouth every 4 (four) hours as needed for pain.      Marland Kitchen lidocaine-prilocaine (EMLA) cream Apply topically as needed.  30 g  7  . loratadine (CLARITIN) 10 MG tablet Take 10 mg by mouth as needed for allergies.      Marland Kitchen LORazepam (ATIVAN) 0.5 MG tablet       . ondansetron (ZOFRAN) 8 MG tablet Take 1 tablet (8 mg total) by mouth 2 (two) times daily as needed. Take two times a day as needed for nausea or vomiting starting on the third day after chemotherapy.  30 tablet  1  . prochlorperazine (COMPAZINE) 10 MG tablet Take 1 tablet (10 mg total) by mouth every 6 (six) hours as needed (Nausea or vomiting).  30 tablet  1   No current facility-administered medications for this visit.    SURGICAL HISTORY:  Past Surgical History  Procedure Laterality Date  . Dilation and curettage of uterus  1993  . Cervical ablation  1983  . Breast biopsy Right 08/06/12  . Popliteal synovial cyst excision  1970  . Portacath placement Left 09/12/2012    Procedure: INSERTION PORT-A-CATH;  Surgeon: Emelia Loron, MD;  Location: [facility] OR;  Service: General;  Laterality: Left;    REVIEW OF SYSTEMS:  Pertinent items are noted in HPI.   HEALTH MAINTENANCE:   PHYSICAL EXAMINATION: Blood pressure 112/75, pulse 98, temperature 98.2 F (36.8 C), temperature source Oral, resp. rate 20, weight 188 lb 12.8 oz (85.639 kg). Body mass index is 29.56 kg/(m^2). General: Patient is a well appearing female in no acute distress HEENT: PERRLA, sclerae anicteric no  conjunctival pallor, MMM Neck: supple, no palpable adenopathy Lungs: clear to auscultation bilaterally, no wheezes, rhonchi, or rales Cardiovascular: regular rate rhythm, S1, S2, no murmurs, rubs or gallops Abdomen: Soft, non-tender, non-distended, normoactive bowel sounds, no HSM Extremities: warm and well perfused, no clubbing, cyanosis, or edema Skin: No rashes or lesions Neuro: Non-focal ECOG PERFORMANCE STATUS: 1 - Symptomatic but completely ambulatory  None performed  LABORATORY DATA: Lab Results  Component Value Date   WBC 16.3* 10/25/2012   HGB 11.5* 10/25/2012   HCT 34.5* 10/25/2012   MCV 90.3 10/25/2012   PLT 257 10/25/2012      Chemistry      Component Value Date/Time   NA 139 10/18/2012 1438   K 4.1 10/18/2012 1438   CO2 26 10/18/2012 1438   BUN 12.6 10/18/2012 1438   CREATININE 0.7 10/18/2012 1438  Component Value Date/Time   CALCIUM 9.2 10/18/2012 1438   ALKPHOS 96 10/18/2012 1438   AST 10 10/18/2012 1438   ALT 9 10/18/2012 1438   BILITOT 0.29 10/18/2012 1438       RADIOGRAPHIC STUDIES:  Ct Chest W Contrast  08/29/2012   *RADIOLOGY REPORT*  Clinical Data:  Breast cancer.  CT CHEST, ABDOMEN AND PELVIS WITH CONTRAST  Technique:  Multidetector CT imaging of the chest, abdomen and pelvis was performed following the standard protocol during bolus administration of intravenous contrast.  Contrast: OMNIPAQUE IOHEXOL 300 MG/ML  SOLN  Comparison:  PET 08/29/2012.  CT CHEST  Findings:  No pathologically enlarged mediastinal, hilar, axillary or internal mammary lymph nodes.  Primary right breast mass measures 3.1 x 3.4 cm.  There is a smaller nodule along the superior medial margin of the primary mass, measuring 1.2 x 1.3 cm. Heart size normal.  No pericardial effusion.  Minimal biapical pleural parenchymal scarring.  An oblong 4 mm (2 x 6 mm) nodule along the left major fissure is most consistent with a subpleural lymph node.  Lungs are otherwise clear.  No pleural fluid.   Airway is unremarkable.  IMPRESSION: Right breast lesions, consistent with the given history of breast cancer, without evidence of metastatic disease.  CT ABDOMEN AND PELVIS  Findings:  Liver, gallbladder, adrenal glands, kidneys, spleen, pancreas, stomach and small bowel are unremarkable.  Appendix is normal.  A fair amount of stool is seen in the colon, indicative of constipation.  No pathologically enlarged lymph nodes.  Uterus and ovaries are visualized.  No free fluid.  No worrisome lytic or sclerotic lesions.  IMPRESSION: No evidence of metastatic disease in the abdomen or pelvis.   Original Report Authenticated By: Leanna Battles, M.D.   Ct Abdomen Pelvis W Contrast  08/29/2012   *RADIOLOGY REPORT*  Clinical Data:  Breast cancer.  CT CHEST, ABDOMEN AND PELVIS WITH CONTRAST  Technique:  Multidetector CT imaging of the chest, abdomen and pelvis was performed following the standard protocol during bolus administration of intravenous contrast.  Contrast: OMNIPAQUE IOHEXOL 300 MG/ML  SOLN  Comparison:  PET 08/29/2012.  CT CHEST  Findings:  No pathologically enlarged mediastinal, hilar, axillary or internal mammary lymph nodes.  Primary right breast mass measures 3.1 x 3.4 cm.  There is a smaller nodule along the superior medial margin of the primary mass, measuring 1.2 x 1.3 cm. Heart size normal.  No pericardial effusion.  Minimal biapical pleural parenchymal scarring.  An oblong 4 mm (2 x 6 mm) nodule along the left major fissure is most consistent with a subpleural lymph node.  Lungs are otherwise clear.  No pleural fluid.  Airway is unremarkable.  IMPRESSION: Right breast lesions, consistent with the given history of breast cancer, without evidence of metastatic disease.  CT ABDOMEN AND PELVIS  Findings:  Liver, gallbladder, adrenal glands, kidneys, spleen, pancreas, stomach and small bowel are unremarkable.  Appendix is normal.  A fair amount of stool is seen in the colon, indicative of constipation.   No pathologically enlarged lymph nodes.  Uterus and ovaries are visualized.  No free fluid.  No worrisome lytic or sclerotic lesions.  IMPRESSION: No evidence of metastatic disease in the abdomen or pelvis.   Original Report Authenticated By: Leanna Battles, M.D.   Mr Breast Bilateral W Wo Contrast  08/27/2012   *RADIOLOGY REPORT*  Clinical Data: Newly-diagnosed right breast invasive ductal carcinoma manifesting as a palpable mass.  BILATERAL BREAST MRI WITH AND WITHOUT  CONTRAST  Technique: Multiplanar, multisequence MR images of both breasts were obtained prior to and following the intravenous administration of 16ml of Multihance.  Three dimensional images were evaluated at the independent DynaCad workstation.  Comparison:  [place] mammograms 2014 and 2012.  We are not provided with post biopsy mammograms to indicate whether a clip was placed.  Findings: Background parenchymal enhancement pattern is mild. Breast parenchymal pattern suggests heterogeneously dense parenchyma.  In the right breast upper outer quadrant is an irregular lobulated mass with central T2 hyperintensity suggesting necrosis or biopsy artifact, with a satellite nodule or lobulation within 2 mm at its superior aspect, measured together as 4.5 x 4.0 x 3.8 cm.  This demonstrates washin/washout type enhancement kinetics.  This corresponds to the reported biopsy-proven breast cancer.  Internal signal voids are noted which may indicate gas, clip artifact, or less likely calcification.  There is extension of enhancement to the nipple, suggesting nipple involvement may be present.  No other area of abnormal enhancement is seen in either breast.  No lymphadenopathy or T2-weighted hyperintensity elsewhere in either breast.  IMPRESSION: Dominant right breast upper outer quadrant irregular mass corresponding to the biopsy-proven breast cancer, measuring 4.5 cm in total and including the satellite nodule or lobulation at its superior aspect, which is  within 2 mm of the dominant mass.  It is not clear from the provided images whether a clip was placed at prior biopsy.  If the patient is to undergo neoadjuvant chemotherapy, placement of a clip is highly recommended, because the mass could become occult on imaging if there is a favorable treatment response, and therefore may not be accurately localized if lumpectomy is planned.  Extension of enhancement from the mass to the right nipple may suggest nipple involvement.  No evidence for contralateral left-sided abnormal enhancement.  RECOMMENDATION: Treatment plan  THREE-DIMENSIONAL MR IMAGE RENDERING ON INDEPENDENT WORKSTATION:  Three-dimensional MR images were rendered by post-processing of the original MR data on an independent workstation.  The three- dimensional MR images were interpreted, and findings were reported in the accompanying complete MRI report for this study.  BI-RADS CATEGORY 6:  Known biopsy-proven malignancy - appropriate action should be taken.   Original Report Authenticated By: Christiana Pellant, M.D.   Nm Pet Image Initial (pi) Skull Base To Thigh  08/29/2012   *RADIOLOGY REPORT*  Clinical Data: Initial treatment strategy for breast cancer.  NUCLEAR MEDICINE PET SKULL BASE TO THIGH  Fasting Blood Glucose:  88  Technique:  17.6 mCi F-18 FDG was injected intravenously. CT data was obtained and used for attenuation correction and anatomic localization only.  (This was not acquired as a diagnostic CT examination.) Additional exam technical data entered on technologist worksheet.  Comparison:  CT chest abdomen pelvis 08/29/2012.  Findings:  Neck: No hypermetabolic lymph nodes in the neck.  Hypermetabolic brown fat is noted.  CT images show no acute findings.  Chest:  No hypermetabolic mediastinal or hilar lymph nodes.  No suspicious pulmonary nodules.  There is hypermetabolic brown fat.  Primary right breast mass measures 3.2 x 3.3 cm with an S U V max of 16.0.  There is an adjacent nodule along the  superomedial border of the primary mass, measuring 1.4 cm, also hypermetabolic.  No additional areas of abnormal hypermetabolism in the chest.  CT images show no acute findings.  Specifically, no pericardial or pleural effusion.  Abdomen/Pelvis:  No abnormal hypermetabolic activity within the liver, pancreas, adrenal glands or spleen.  No hypermetabolic lymph nodes.  CT images show no acute findings.  Skeleton:  No focal hypermetabolic activity to suggest skeletal metastasis.  IMPRESSION: Hypermetabolic right breast lesions, consistent with the given history breast cancer, without evidence of metastatic disease.   Original Report Authenticated By: Leanna Battles, M.D.   Dg Chest Port 1 View  09/12/2012   *RADIOLOGY REPORT*  Clinical Data: Port-A-Cath placement  PORTABLE CHEST - 1 VIEW  Comparison: CT chest 08/29/2012  Findings: Left subclavian Port-A-Cath tip in the mid SVC.  No pneumothorax  The lungs are clear.  IMPRESSION: Satisfactory Port-A-Cath placement.   Original Report Authenticated By: Janeece Riggers, M.D.   Dg Fluoro Guide Cv Line-no Report  09/12/2012   CLINICAL DATA: right breast mass   FLOURO GUIDE CV LINE  Fluoroscopy was utilized by the requesting physician.  No radiographic  interpretation.     ASSESSMENT: 47 year old female with  #1 T2 NX MX invasive ductal carcinoma of the breast found a self breast examination of the right breast. She is a good candidate for neoadjuvant chemotherapy. We discussed treatment with Adriamycin Cytoxan followed by Taxol. She will receive dose dense a.c. for 4 cycles followed by Taxol weekly for 12 weeks. She is scheduled to have a Port-A-Cath placed next week by Dr. Emelia Loron. She desires to begin treatment as soon as possible.  #2 we discussed her CT scan results and PET scan results as well as her MRI results today.  #3 patient has begun neoadjuvant chemotherapy with AC on 7/18. Total 4 cycles planned, after completion of AC she will begin single  agent taxol weekly x 12 weeks   PLAN:  #1 Doing well.  Labs are stable.  Patient will proceed with chemotherapy.  I will prescribe a Zpak for her to take for the possible upper respiratory tract infection.    #2 She will return in one week for labs and an appointment for evaluation.    # She has hemorrhoids and will receive a prescription for Pramoxine.     All questions were answered. The patient knows to call the clinic with any problems, questions or concerns. We can certainly see the patient much sooner if necessary.  I spent 25 minutes counseling the patient face to face. The total time spent in the appointment was 30 minutes.  Cherie Ouch Lyn Hollingshead, NP Medical Oncology [facility] Phone: [phone]

## 2012-10-26 ENCOUNTER — Ambulatory Visit (HOSPITAL_BASED_OUTPATIENT_CLINIC_OR_DEPARTMENT_OTHER): Payer: BC Managed Care – PPO

## 2012-10-26 ENCOUNTER — Ambulatory Visit: Payer: BC Managed Care – PPO

## 2012-10-26 VITALS — BP 112/74 | HR 97 | Temp 98.0°F

## 2012-10-26 DIAGNOSIS — C50419 Malignant neoplasm of upper-outer quadrant of unspecified female breast: Secondary | ICD-10-CM

## 2012-10-26 DIAGNOSIS — Z5189 Encounter for other specified aftercare: Secondary | ICD-10-CM

## 2012-10-26 MED ORDER — PEGFILGRASTIM INJECTION 6 MG/0.6ML
6.0000 mg | Freq: Once | SUBCUTANEOUS | Status: AC
  Administered 2012-10-26: 6 mg via SUBCUTANEOUS
  Filled 2012-10-26: qty 0.6

## 2012-10-29 ENCOUNTER — Telehealth: Payer: Self-pay | Admitting: Oncology

## 2012-10-29 NOTE — Telephone Encounter (Signed)
, °

## 2012-10-30 ENCOUNTER — Telehealth: Payer: Self-pay | Admitting: *Deleted

## 2012-10-30 ENCOUNTER — Encounter: Payer: Self-pay | Admitting: Adult Health

## 2012-10-30 NOTE — Telephone Encounter (Signed)
Per staff message and POF I have scheduled appts.  JMW  

## 2012-10-30 NOTE — Progress Notes (Signed)
This encounter was created in error - please disregard.

## 2012-11-01 ENCOUNTER — Encounter: Payer: Self-pay | Admitting: *Deleted

## 2012-11-01 ENCOUNTER — Ambulatory Visit (HOSPITAL_BASED_OUTPATIENT_CLINIC_OR_DEPARTMENT_OTHER): Payer: BC Managed Care – PPO | Admitting: Adult Health

## 2012-11-01 ENCOUNTER — Other Ambulatory Visit (HOSPITAL_BASED_OUTPATIENT_CLINIC_OR_DEPARTMENT_OTHER): Payer: BC Managed Care – PPO | Admitting: Lab

## 2012-11-01 ENCOUNTER — Encounter: Payer: Self-pay | Admitting: Adult Health

## 2012-11-01 VITALS — BP 124/81 | HR 106 | Temp 98.3°F | Resp 18 | Ht 67.0 in | Wt 191.6 lb

## 2012-11-01 DIAGNOSIS — C50919 Malignant neoplasm of unspecified site of unspecified female breast: Secondary | ICD-10-CM

## 2012-11-01 DIAGNOSIS — C50419 Malignant neoplasm of upper-outer quadrant of unspecified female breast: Secondary | ICD-10-CM

## 2012-11-01 LAB — CBC WITH DIFFERENTIAL/PLATELET
BASO%: 2.6 % — ABNORMAL HIGH (ref 0.0–2.0)
EOS%: 0.4 % (ref 0.0–7.0)
MCH: 30.7 pg (ref 25.1–34.0)
MCV: 89.5 fL (ref 79.5–101.0)
MONO%: 6.7 % (ref 0.0–14.0)
RBC: 3 10*6/uL — ABNORMAL LOW (ref 3.70–5.45)
RDW: 14.3 % (ref 11.2–14.5)

## 2012-11-01 LAB — COMPREHENSIVE METABOLIC PANEL (CC13)
AST: 11 U/L (ref 5–34)
Albumin: 3.6 g/dL (ref 3.5–5.0)
Alkaline Phosphatase: 95 U/L (ref 40–150)
Potassium: 3.6 mEq/L (ref 3.5–5.1)
Sodium: 140 mEq/L (ref 136–145)
Total Protein: 6.6 g/dL (ref 6.4–8.3)

## 2012-11-01 NOTE — Patient Instructions (Addendum)
Super B complex daily   Paclitaxel injection What is this medicine? PACLITAXEL (PAK li TAX el) is a chemotherapy drug. It targets fast dividing cells, like cancer cells, and causes these cells to die. This medicine is used to treat ovarian cancer, breast cancer, and other cancers. This medicine may be used for other purposes; ask your health care provider or pharmacist if you have questions. What should I tell my health care provider before I take this medicine? They need to know if you have any of these conditions: -blood disorders -irregular heartbeat -infection (especially a virus infection such as chickenpox, cold sores, or herpes) -liver disease -previous or ongoing radiation therapy -an unusual or allergic reaction to paclitaxel, alcohol, polyoxyethylated castor oil, other chemotherapy agents, other medicines, foods, dyes, or preservatives -pregnant or trying to get pregnant -breast-feeding How should I use this medicine? This drug is given as an infusion into a vein. It is administered in a hospital or clinic by a specially trained health care professional. Talk to your pediatrician regarding the use of this medicine in children. Special care may be needed. Overdosage: If you think you have taken too much of this medicine contact a poison control center or emergency room at once. NOTE: This medicine is only for you. Do not share this medicine with others. What if I miss a dose? It is important not to miss your dose. Call your doctor or health care professional if you are unable to keep an appointment. What may interact with this medicine? Do not take this medicine with any of the following medications: -disulfiram -metronidazole This medicine may also interact with the following medications: -cyclosporine -dexamethasone -diazepam -ketoconazole -medicines to increase blood counts like filgrastim, pegfilgrastim, sargramostim -other chemotherapy drugs like cisplatin,  doxorubicin, epirubicin, etoposide, teniposide, vincristine -quinidine -testosterone -vaccines -verapamil Talk to your doctor or health care professional before taking any of these medicines: -acetaminophen -aspirin -ibuprofen -ketoprofen -naproxen This list may not describe all possible interactions. Give your health care provider a list of all the medicines, herbs, non-prescription drugs, or dietary supplements you use. Also tell them if you smoke, drink alcohol, or use illegal drugs. Some items may interact with your medicine. What should I watch for while using this medicine? Your condition will be monitored carefully while you are receiving this medicine. You will need important blood work done while you are taking this medicine. This drug may make you feel generally unwell. This is not uncommon, as chemotherapy can affect healthy cells as well as cancer cells. Report any side effects. Continue your course of treatment even though you feel ill unless your doctor tells you to stop. In some cases, you may be given additional medicines to help with side effects. Follow all directions for their use. Call your doctor or health care professional for advice if you get a fever, chills or sore throat, or other symptoms of a cold or flu. Do not treat yourself. This drug decreases your body's ability to fight infections. Try to avoid being around people who are sick. This medicine may increase your risk to bruise or bleed. Call your doctor or health care professional if you notice any unusual bleeding. Be careful brushing and flossing your teeth or using a toothpick because you may get an infection or bleed more easily. If you have any dental work done, tell your dentist you are receiving this medicine. Avoid taking products that contain aspirin, acetaminophen, ibuprofen, naproxen, or ketoprofen unless instructed by your doctor. These medicines may hide  a fever. Do not become pregnant while taking this  medicine. Women should inform their doctor if they wish to become pregnant or think they might be pregnant. There is a potential for serious side effects to an unborn child. Talk to your health care professional or pharmacist for more information. Do not breast-feed an infant while taking this medicine. Men are advised not to father a child while receiving this medicine. What side effects may I notice from receiving this medicine? Side effects that you should report to your doctor or health care professional as soon as possible: -allergic reactions like skin rash, itching or hives, swelling of the face, lips, or tongue -low blood counts - This drug may decrease the number of white blood cells, red blood cells and platelets. You may be at increased risk for infections and bleeding. -signs of infection - fever or chills, cough, sore throat, pain or difficulty passing urine -signs of decreased platelets or bleeding - bruising, pinpoint red spots on the skin, black, tarry stools, nosebleeds -signs of decreased red blood cells - unusually weak or tired, fainting spells, lightheadedness -breathing problems -chest pain -high or low blood pressure -mouth sores -nausea and vomiting -pain, swelling, redness or irritation at the injection site -pain, tingling, numbness in the hands or feet -slow or irregular heartbeat -swelling of the ankle, feet, hands Side effects that usually do not require medical attention (report to your doctor or health care professional if they continue or are bothersome): -bone pain -complete hair loss including hair on your head, underarms, pubic hair, eyebrows, and eyelashes -changes in the color of fingernails -diarrhea -loosening of the fingernails -loss of appetite -muscle or joint pain -red flush to skin -sweating This list may not describe all possible side effects. Call your doctor for medical advice about side effects. You may report side effects to FDA at  1-800-FDA-1088. Where should I keep my medicine? This drug is given in a hospital or clinic and will not be stored at home. NOTE: This sheet is a summary. It may not cover all possible information. If you have questions about this medicine, talk to your doctor, pharmacist, or health care provider.  2013, [employer]. (01/27/2008 11:54:26 AM)

## 2012-11-01 NOTE — Progress Notes (Signed)
OFFICE PROGRESS NOTE  CC  [name],[name], MD [address] [place] [address] Dr. Reuel Boom Pomposini  Dr. Emelia Loron  Dr. Lurline Hare  DIAGNOSIS: 47 year old female with new diagnosis of stage II right breast cancer  STAGE:  Right breast  Stage II,T2NxMx  ER+PR-Her2Neu-  Ki-67 high   PRIOR THERAPY:  #1June 2013 had a mammogram that was normal. But there was on physical exam possibility of a cyst noted in the right breast. The mammogram was negative. In 2014 June patient noted on exam another lump in the right breast. She underwent a diagnostic mammogram on June 10 that showed a right breast nodule in the outer quadrant. She had an ultrasound performed that showed at the 9:30 o'clock position a 3.6 cm area and then added 10:00 position 1.3 cm area with a total area being anywhere between 5-6 cm. The patient went on to have a right breast biopsy performed in [place]. The pathology revealed an invasive ductal carcinoma. This is been confirmed by our pathology as well. The carcinoma and papillary features and was felt to be between a grade 1 and 2. The tumor was estrogen receptor positive strongly (100%) progesterone receptor negative HER-2/neu negative with a Ki-67 that showed a high proliferation rate.  #2 patient was seen by me on 08/22/2012 to discuss treatment options. Due to the size of the tumor I had recommended that patient proceed with neoadjuvant treatment initially consisting of chemotherapy. However due to the workup being incomplete I did recommend that she undergo MRI of the breasts as well as staging studies. When she has had. MRI of the breasts performed on 08/27/2012 revealed in the right upper quadrant irregular lobulated mass with a satellite nodule in ambulation within 2 mm at its superior aspect measured together as 4.5 x 4.0 x 3.8 cm. No lymphadenopathy was noted there was no any other area of abnormal enhancement in either breast. Patient also had PET scans  performed on 73 that revealed the primary breast cancer measuring 3.2 x 3.3 cm with SUV of 16. It was adjacent nodule along his pure medial border of the primary mass measuring 1.4 cm which was also hypermetabolic. There were no additional areas of abnormal hypermetabolism In the chest. Abdomen and pelvis showed no abnormal hypermetabolic activity within the liver pancreas adrenal glands or spleen. No hypermetabolic lymph nodes. In the skeleton no focal hypermetabolic activity to suggest skeletal metastasis.  #3 patient was also seen by Dr. Emelia Loron as well as Dr. Lurline Hare in consultation. Dr. Dwain Sarna has agreed to place a port for eventual chemotherapy. Patient also has had chemotherapy teaching class. The port will be placed on 09/12/2012.  #4. Began Neoadjuvant AC q 2 weeks on 7/18 she completed this on 10/25/12.    CURRENT THERAPY: Neoadjuvant  cycle 4 day 8 of AC  INTERVAL HISTORY: Elizabeth Miles 47 y.o. female returns for evaluation following her fourth cycle of Adriamycin/Cytoxan.  She is doing well today.  She continues to have a mild cough and nasal drainage, and was completed the zpak that was prescribed to her last week.  Otherwise, she denies fevers, chills, vomiting, constipation, numbness, or any further concerns.    MEDICAL HISTORY: Past Medical History  Diagnosis Date  . 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 GERD (gastroesophageal reflux disease)   . GERD (gastroesophageal reflux disease) 08/22/2012  . Allergy   . Complication of anesthesia     1986 ; problem waking up  .  Anxiety     ALLERGIES:  is allergic to darvocet; shellfish allergy; and other.  MEDICATIONS:  Current Outpatient Prescriptions  Medication Sig Dispense Refill  . acetaminophen (TYLENOL) 325 MG tablet Take 650 mg by mouth every 4 (four) hours as needed for pain.      . ciprofloxacin (CIPRO) 500 MG tablet Take 1 tablet (500 mg total) by mouth 2 (two) times daily.  28 tablet   5  . Dexlansoprazole (DEXILANT) 30 MG capsule Take 1 capsule (30 mg total) by mouth daily.  30 capsule  7  . hyaluronate sodium (RADIAPLEXRX) GEL Apply 1 application topically once. Apply after rad txs and bedtime,prn      . hydrocortisone-pramoxine (PROCTOFOAM-HC) rectal foam Place 1 applicator rectally 2 (two) times daily.  10 g  0  . ibuprofen (ADVIL,MOTRIN) 200 MG tablet Take 200 mg by mouth every 4 (four) hours as needed for pain.      Marland Kitchen lidocaine-prilocaine (EMLA) cream Apply topically as needed.  30 g  7  . loratadine (CLARITIN) 10 MG tablet Take 10 mg by mouth as needed for allergies.      Marland Kitchen LORazepam (ATIVAN) 0.5 MG tablet       . triamcinolone cream (KENALOG) 0.1 % Apply topically 2 (two) times daily.  30 g  0   No current facility-administered medications for this visit.    SURGICAL HISTORY:  Past Surgical History  Procedure Laterality Date  . Dilation and curettage of uterus  1993  . Cervical ablation  1983  . Breast biopsy Right 08/06/12  . Popliteal synovial cyst excision  1970  . Portacath placement Left 09/12/2012    Procedure: INSERTION PORT-A-CATH;  Surgeon: Emelia Loron, MD;  Location: [facility] OR;  Service: General;  Laterality: Left;    REVIEW OF SYSTEMS:  Pertinent items are noted in HPI.   HEALTH MAINTENANCE:   PHYSICAL EXAMINATION: Blood pressure 124/81, pulse 106, temperature 98.3 F (36.8 C), temperature source Oral, resp. rate 18, height 5\' 7"  (1.702 m), weight 191 lb 9.6 oz (86.909 kg). Body mass index is 30 kg/(m^2). General: Patient is a well appearing female in no acute distress HEENT: PERRLA, sclerae anicteric no conjunctival pallor, MMM Neck: supple, no palpable adenopathy Lungs: clear to auscultation bilaterally, no wheezes, rhonchi, or rales Cardiovascular: regular rate rhythm, S1, S2, no murmurs, rubs or gallops Abdomen: Soft, non-tender, non-distended, normoactive bowel sounds, no HSM Extremities: warm and well perfused, no clubbing, cyanosis,  or edema Skin: No rashes or lesions Neuro: Non-focal ECOG PERFORMANCE STATUS: 1 - Symptomatic but completely ambulatory   LABORATORY DATA: Lab Results  Component Value Date   WBC 1.7* 11/01/2012   HGB 9.2* 11/01/2012   HCT 26.8* 11/01/2012   MCV 89.5 11/01/2012   PLT 222 11/01/2012      Chemistry      Component Value Date/Time   NA 140 11/01/2012 1446   K 3.6 11/01/2012 1446   CO2 26 11/01/2012 1446   BUN 10.8 11/01/2012 1446   CREATININE 0.7 11/01/2012 1446      Component Value Date/Time   CALCIUM 9.1 11/01/2012 1446   ALKPHOS 95 11/01/2012 1446   AST 11 11/01/2012 1446   ALT 15 11/01/2012 1446   BILITOT 0.28 11/01/2012 1446       RADIOGRAPHIC STUDIES:  Ct Chest W Contrast  08/29/2012   *RADIOLOGY REPORT*  Clinical Data:  Breast cancer.  CT CHEST, ABDOMEN AND PELVIS WITH CONTRAST  Technique:  Multidetector CT imaging of the chest, abdomen and pelvis  was performed following the standard protocol during bolus administration of intravenous contrast.  Contrast: OMNIPAQUE IOHEXOL 300 MG/ML  SOLN  Comparison:  PET 08/29/2012.  CT CHEST  Findings:  No pathologically enlarged mediastinal, hilar, axillary or internal mammary lymph nodes.  Primary right breast mass measures 3.1 x 3.4 cm.  There is a smaller nodule along the superior medial margin of the primary mass, measuring 1.2 x 1.3 cm. Heart size normal.  No pericardial effusion.  Minimal biapical pleural parenchymal scarring.  An oblong 4 mm (2 x 6 mm) nodule along the left major fissure is most consistent with a subpleural lymph node.  Lungs are otherwise clear.  No pleural fluid.  Airway is unremarkable.  IMPRESSION: Right breast lesions, consistent with the given history of breast cancer, without evidence of metastatic disease.  CT ABDOMEN AND PELVIS  Findings:  Liver, gallbladder, adrenal glands, kidneys, spleen, pancreas, stomach and small bowel are unremarkable.  Appendix is normal.  A fair amount of stool is seen in the colon, indicative of  constipation.  No pathologically enlarged lymph nodes.  Uterus and ovaries are visualized.  No free fluid.  No worrisome lytic or sclerotic lesions.  IMPRESSION: No evidence of metastatic disease in the abdomen or pelvis.   Original Report Authenticated By: Leanna Battles, M.D.   Ct Abdomen Pelvis W Contrast  08/29/2012   *RADIOLOGY REPORT*  Clinical Data:  Breast cancer.  CT CHEST, ABDOMEN AND PELVIS WITH CONTRAST  Technique:  Multidetector CT imaging of the chest, abdomen and pelvis was performed following the standard protocol during bolus administration of intravenous contrast.  Contrast: OMNIPAQUE IOHEXOL 300 MG/ML  SOLN  Comparison:  PET 08/29/2012.  CT CHEST  Findings:  No pathologically enlarged mediastinal, hilar, axillary or internal mammary lymph nodes.  Primary right breast mass measures 3.1 x 3.4 cm.  There is a smaller nodule along the superior medial margin of the primary mass, measuring 1.2 x 1.3 cm. Heart size normal.  No pericardial effusion.  Minimal biapical pleural parenchymal scarring.  An oblong 4 mm (2 x 6 mm) nodule along the left major fissure is most consistent with a subpleural lymph node.  Lungs are otherwise clear.  No pleural fluid.  Airway is unremarkable.  IMPRESSION: Right breast lesions, consistent with the given history of breast cancer, without evidence of metastatic disease.  CT ABDOMEN AND PELVIS  Findings:  Liver, gallbladder, adrenal glands, kidneys, spleen, pancreas, stomach and small bowel are unremarkable.  Appendix is normal.  A fair amount of stool is seen in the colon, indicative of constipation.  No pathologically enlarged lymph nodes.  Uterus and ovaries are visualized.  No free fluid.  No worrisome lytic or sclerotic lesions.  IMPRESSION: No evidence of metastatic disease in the abdomen or pelvis.   Original Report Authenticated By: Leanna Battles, M.D.   Mr Breast Bilateral W Wo Contrast  08/27/2012   *RADIOLOGY REPORT*  Clinical Data: Newly-diagnosed  right breast invasive ductal carcinoma manifesting as a palpable mass.  BILATERAL BREAST MRI WITH AND WITHOUT CONTRAST  Technique: Multiplanar, multisequence MR images of both breasts were obtained prior to and following the intravenous administration of 16ml of Multihance.  Three dimensional images were evaluated at the independent DynaCad workstation.  Comparison:  [place] mammograms 2014 and 2012.  We are not provided with post biopsy mammograms to indicate whether a clip was placed.  Findings: Background parenchymal enhancement pattern is mild. Breast parenchymal pattern suggests heterogeneously dense parenchyma.  In the right breast  upper outer quadrant is an irregular lobulated mass with central T2 hyperintensity suggesting necrosis or biopsy artifact, with a satellite nodule or lobulation within 2 mm at its superior aspect, measured together as 4.5 x 4.0 x 3.8 cm.  This demonstrates washin/washout type enhancement kinetics.  This corresponds to the reported biopsy-proven breast cancer.  Internal signal voids are noted which may indicate gas, clip artifact, or less likely calcification.  There is extension of enhancement to the nipple, suggesting nipple involvement may be present.  No other area of abnormal enhancement is seen in either breast.  No lymphadenopathy or T2-weighted hyperintensity elsewhere in either breast.  IMPRESSION: Dominant right breast upper outer quadrant irregular mass corresponding to the biopsy-proven breast cancer, measuring 4.5 cm in total and including the satellite nodule or lobulation at its superior aspect, which is within 2 mm of the dominant mass.  It is not clear from the provided images whether a clip was placed at prior biopsy.  If the patient is to undergo neoadjuvant chemotherapy, placement of a clip is highly recommended, because the mass could become occult on imaging if there is a favorable treatment response, and therefore may not be accurately localized if lumpectomy  is planned.  Extension of enhancement from the mass to the right nipple may suggest nipple involvement.  No evidence for contralateral left-sided abnormal enhancement.  RECOMMENDATION: Treatment plan  THREE-DIMENSIONAL MR IMAGE RENDERING ON INDEPENDENT WORKSTATION:  Three-dimensional MR images were rendered by post-processing of the original MR data on an independent workstation.  The three- dimensional MR images were interpreted, and findings were reported in the accompanying complete MRI report for this study.  BI-RADS CATEGORY 6:  Known biopsy-proven malignancy - appropriate action should be taken.   Original Report Authenticated By: Christiana Pellant, M.D.   Nm Pet Image Initial (pi) Skull Base To Thigh  08/29/2012   *RADIOLOGY REPORT*  Clinical Data: Initial treatment strategy for breast cancer.  NUCLEAR MEDICINE PET SKULL BASE TO THIGH  Fasting Blood Glucose:  88  Technique:  17.6 mCi F-18 FDG was injected intravenously. CT data was obtained and used for attenuation correction and anatomic localization only.  (This was not acquired as a diagnostic CT examination.) Additional exam technical data entered on technologist worksheet.  Comparison:  CT chest abdomen pelvis 08/29/2012.  Findings:  Neck: No hypermetabolic lymph nodes in the neck.  Hypermetabolic brown fat is noted.  CT images show no acute findings.  Chest:  No hypermetabolic mediastinal or hilar lymph nodes.  No suspicious pulmonary nodules.  There is hypermetabolic brown fat.  Primary right breast mass measures 3.2 x 3.3 cm with an S U V max of 16.0.  There is an adjacent nodule along the superomedial border of the primary mass, measuring 1.4 cm, also hypermetabolic.  No additional areas of abnormal hypermetabolism in the chest.  CT images show no acute findings.  Specifically, no pericardial or pleural effusion.  Abdomen/Pelvis:  No abnormal hypermetabolic activity within the liver, pancreas, adrenal glands or spleen.  No hypermetabolic lymph nodes.   CT images show no acute findings.  Skeleton:  No focal hypermetabolic activity to suggest skeletal metastasis.  IMPRESSION: Hypermetabolic right breast lesions, consistent with the given history breast cancer, without evidence of metastatic disease.   Original Report Authenticated By: Leanna Battles, M.D.   Dg Chest Port 1 View  09/12/2012   *RADIOLOGY REPORT*  Clinical Data: Port-A-Cath placement  PORTABLE CHEST - 1 VIEW  Comparison: CT chest 08/29/2012  Findings: Left subclavian Port-A-Cath  tip in the mid SVC.  No pneumothorax  The lungs are clear.  IMPRESSION: Satisfactory Port-A-Cath placement.   Original Report Authenticated By: Janeece Riggers, M.D.   Dg Fluoro Guide Cv Line-no Report  09/12/2012   CLINICAL DATA: right breast mass   FLOURO GUIDE CV LINE  Fluoroscopy was utilized by the requesting physician.  No radiographic  interpretation.     ASSESSMENT: 47 year old female with  #1 T2 NX MX invasive ductal carcinoma of the breast found a self breast examination of the right breast. She is a good candidate for neoadjuvant chemotherapy. We discussed treatment with Adriamycin Cytoxan followed by Taxol. She will receive dose dense a.c. for 4 cycles followed by Taxol weekly for 12 weeks. She is scheduled to have a Port-A-Cath placed next week by Dr. Emelia Loron. She desires to begin treatment as soon as possible.  #2 we discussed her CT scan results and PET scan results as well as her MRI results today.  #3 patient has begun neoadjuvant chemotherapy with AC on 7/18. Total 4 cycles planned, after completion of AC she will begin single agent taxol weekly x 12 weeks   PLAN:  #1 Doing well.  Labs are stable.  She is not neutropenic.  She will return in one week for taxol.  She just completed a zpak and is doing okay.  She will call us if she develops a fever.     #2 She will return in one week for labs, an appointment, and first taxol.      All questions were answered. The patient knows to  call the clinic with any problems, questions or concerns. We can certainly see the patient much sooner if necessary.  I spent 25 minutes counseling the patient face to face. The total time spent in the appointment was 30 minutes.  Cherie Ouch Lyn Hollingshead, NP Medical Oncology [facility] Phone: [phone]

## 2012-11-08 ENCOUNTER — Encounter (INDEPENDENT_AMBULATORY_CARE_PROVIDER_SITE_OTHER): Payer: BC Managed Care – PPO | Admitting: General Surgery

## 2012-11-08 ENCOUNTER — Other Ambulatory Visit: Payer: BC Managed Care – PPO | Admitting: Lab

## 2012-11-08 ENCOUNTER — Ambulatory Visit (HOSPITAL_BASED_OUTPATIENT_CLINIC_OR_DEPARTMENT_OTHER): Payer: BC Managed Care – PPO

## 2012-11-08 ENCOUNTER — Ambulatory Visit: Payer: BC Managed Care – PPO | Admitting: Adult Health

## 2012-11-08 ENCOUNTER — Encounter: Payer: Self-pay | Admitting: Oncology

## 2012-11-08 ENCOUNTER — Other Ambulatory Visit (HOSPITAL_BASED_OUTPATIENT_CLINIC_OR_DEPARTMENT_OTHER): Payer: BC Managed Care – PPO

## 2012-11-08 ENCOUNTER — Ambulatory Visit (HOSPITAL_BASED_OUTPATIENT_CLINIC_OR_DEPARTMENT_OTHER): Payer: BC Managed Care – PPO | Admitting: Oncology

## 2012-11-08 VITALS — BP 134/85 | HR 93 | Temp 97.4°F | Resp 18 | Ht 67.0 in | Wt 191.4 lb

## 2012-11-08 VITALS — BP 128/76 | HR 96 | Temp 98.1°F | Resp 18

## 2012-11-08 DIAGNOSIS — C50911 Malignant neoplasm of unspecified site of right female breast: Secondary | ICD-10-CM

## 2012-11-08 DIAGNOSIS — C50919 Malignant neoplasm of unspecified site of unspecified female breast: Secondary | ICD-10-CM

## 2012-11-08 DIAGNOSIS — Z170 Estrogen receptor positive status [ER+]: Secondary | ICD-10-CM

## 2012-11-08 DIAGNOSIS — C50419 Malignant neoplasm of upper-outer quadrant of unspecified female breast: Secondary | ICD-10-CM

## 2012-11-08 DIAGNOSIS — Z5111 Encounter for antineoplastic chemotherapy: Secondary | ICD-10-CM

## 2012-11-08 LAB — COMPREHENSIVE METABOLIC PANEL (CC13)
AST: 18 U/L (ref 5–34)
Albumin: 4 g/dL (ref 3.5–5.0)
Alkaline Phosphatase: 78 U/L (ref 40–150)
BUN: 7.3 mg/dL (ref 7.0–26.0)
Creatinine: 0.7 mg/dL (ref 0.6–1.1)
Potassium: 4.1 mEq/L (ref 3.5–5.1)
Total Bilirubin: 0.23 mg/dL (ref 0.20–1.20)

## 2012-11-08 LAB — CBC WITH DIFFERENTIAL/PLATELET
Basophils Absolute: 0.1 10*3/uL (ref 0.0–0.1)
EOS%: 0.2 % (ref 0.0–7.0)
HGB: 10.4 g/dL — ABNORMAL LOW (ref 11.6–15.9)
MCH: 30.9 pg (ref 25.1–34.0)
MCV: 91 fL (ref 79.5–101.0)
MONO%: 8.9 % (ref 0.0–14.0)
RBC: 3.37 10*6/uL — ABNORMAL LOW (ref 3.70–5.45)
RDW: 16.2 % — ABNORMAL HIGH (ref 11.2–14.5)

## 2012-11-08 MED ORDER — FAMOTIDINE IN NACL 20-0.9 MG/50ML-% IV SOLN
20.0000 mg | Freq: Once | INTRAVENOUS | Status: AC
  Administered 2012-11-08: 20 mg via INTRAVENOUS

## 2012-11-08 MED ORDER — DIPHENHYDRAMINE HCL 50 MG/ML IJ SOLN
INTRAMUSCULAR | Status: AC
  Filled 2012-11-08: qty 1

## 2012-11-08 MED ORDER — SODIUM CHLORIDE 0.9 % IV SOLN
Freq: Once | INTRAVENOUS | Status: AC
  Administered 2012-11-08: 15:00:00 via INTRAVENOUS

## 2012-11-08 MED ORDER — ONDANSETRON 8 MG/50ML IVPB (CHCC)
8.0000 mg | Freq: Once | INTRAVENOUS | Status: AC
  Administered 2012-11-08: 8 mg via INTRAVENOUS

## 2012-11-08 MED ORDER — FAMOTIDINE IN NACL 20-0.9 MG/50ML-% IV SOLN
INTRAVENOUS | Status: AC
  Filled 2012-11-08: qty 50

## 2012-11-08 MED ORDER — DIPHENHYDRAMINE HCL 50 MG/ML IJ SOLN
50.0000 mg | Freq: Once | INTRAMUSCULAR | Status: AC
  Administered 2012-11-08: 50 mg via INTRAVENOUS

## 2012-11-08 MED ORDER — DEXAMETHASONE SODIUM PHOSPHATE 20 MG/5ML IJ SOLN
20.0000 mg | Freq: Once | INTRAMUSCULAR | Status: AC
  Administered 2012-11-08: 20 mg via INTRAVENOUS

## 2012-11-08 MED ORDER — SODIUM CHLORIDE 0.9 % IJ SOLN
10.0000 mL | INTRAMUSCULAR | Status: DC | PRN
  Administered 2012-11-08: 10 mL
  Filled 2012-11-08: qty 10

## 2012-11-08 MED ORDER — DEXAMETHASONE SODIUM PHOSPHATE 20 MG/5ML IJ SOLN
INTRAMUSCULAR | Status: AC
  Filled 2012-11-08: qty 5

## 2012-11-08 MED ORDER — HEPARIN SOD (PORK) LOCK FLUSH 100 UNIT/ML IV SOLN
500.0000 [IU] | Freq: Once | INTRAVENOUS | Status: AC | PRN
  Administered 2012-11-08: 500 [IU]
  Filled 2012-11-08: qty 5

## 2012-11-08 MED ORDER — PACLITAXEL CHEMO INJECTION 300 MG/50ML
80.0000 mg/m2 | Freq: Once | INTRAVENOUS | Status: AC
  Administered 2012-11-08: 162 mg via INTRAVENOUS
  Filled 2012-11-08: qty 27

## 2012-11-08 MED ORDER — ONDANSETRON 8 MG/NS 50 ML IVPB
INTRAVENOUS | Status: AC
  Filled 2012-11-08: qty 8

## 2012-11-08 NOTE — Patient Instructions (Addendum)
Proceed with  Taxol today  We will see you back in 1 week

## 2012-11-08 NOTE — Patient Instructions (Addendum)
[facility] Discharge Instructions for Patients Receiving Chemotherapy  Today you received the following chemotherapy agents: Taxol.  To help prevent nausea and vomiting after your treatment, we encourage you to take your nausea medication as prescribed.   If you develop nausea and vomiting that is not controlled by your nausea medication, call the clinic.   BELOW ARE SYMPTOMS THAT SHOULD BE REPORTED IMMEDIATELY:  *FEVER GREATER THAN 100.5 F  *CHILLS WITH OR WITHOUT FEVER  NAUSEA AND VOMITING THAT IS NOT CONTROLLED WITH YOUR NAUSEA MEDICATION  *UNUSUAL SHORTNESS OF BREATH  *UNUSUAL BRUISING OR BLEEDING  TENDERNESS IN MOUTH AND THROAT WITH OR WITHOUT PRESENCE OF ULCERS  *URINARY PROBLEMS  *BOWEL PROBLEMS  UNUSUAL RASH Items with * indicate a potential emergency and should be followed up as soon as possible.  Feel free to call the clinic you have any questions or concerns. The clinic phone number is [phone].    

## 2012-11-08 NOTE — Progress Notes (Signed)
First time Taxol. VSS throughout treatment (see DOC Flowsheet for vitals).  No reaction. Patient had no complaints.

## 2012-11-08 NOTE — Progress Notes (Signed)
OFFICE PROGRESS NOTE  CC  Elizabeth Miles,Elizabeth C, MD [address] [place] [address] Dr. Reuel Boom Pomposini  Dr. Emelia Loron  Dr. Lurline Hare  DIAGNOSIS: 47 year old female with new diagnosis of stage II right breast cancer  STAGE:  Right breast  Stage II,T2NxMx  ER+PR-Her2Neu-  Ki-67 high   PRIOR THERAPY:  #1June 2013 had a mammogram that was normal. But there was on physical exam possibility of a cyst noted in the right breast. The mammogram was negative. In 2014 June patient noted on exam another lump in the right breast. She underwent a diagnostic mammogram on June 10 that showed a right breast nodule in the outer quadrant. She had an ultrasound performed that showed at the 9:30 o'clock position a 3.6 cm area and then added 10:00 position 1.3 cm area with a total area being anywhere between 5-6 cm. The patient went on to have a right breast biopsy performed in [place]. The pathology revealed an invasive ductal carcinoma. This is been confirmed by our pathology as well. The carcinoma and papillary features and was felt to be between a grade 1 and 2. The tumor was estrogen receptor positive strongly (100%) progesterone receptor negative HER-2/neu negative with a Ki-67 that showed a high proliferation rate.  #2 patient was seen by me on 08/22/2012 to discuss treatment options. Due to the size of the tumor I had recommended that patient proceed with neoadjuvant treatment initially consisting of chemotherapy. However due to the workup being incomplete I did recommend that she undergo MRI of the breasts as well as staging studies. When she has had. MRI of the breasts performed on 08/27/2012 revealed in the right upper quadrant irregular lobulated mass with a satellite nodule in ambulation within 2 mm at its superior aspect measured together as 4.5 x 4.0 x 3.8 cm. No lymphadenopathy was noted there was no any other area of abnormal enhancement in either breast. Patient also had PET scans  performed on 73 that revealed the primary breast cancer measuring 3.2 x 3.3 cm with SUV of 16. It was adjacent nodule along his pure medial border of the primary mass measuring 1.4 cm which was also hypermetabolic. There were no additional areas of abnormal hypermetabolism In the chest. Abdomen and pelvis showed no abnormal hypermetabolic activity within the liver pancreas adrenal glands or spleen. No hypermetabolic lymph nodes. In the skeleton no focal hypermetabolic activity to suggest skeletal metastasis.  #3 patient was also seen by Dr. Emelia Loron as well as Dr. Lurline Hare in consultation. Dr. Dwain Sarna has agreed to place a port for eventual chemotherapy. Patient also has had chemotherapy teaching class. The port will be placed on 09/12/2012.  #4. Began Neoadjuvant AC q 2 weeks on 7/18 she completed this on 10/25/12.    CURRENT THERAPY: Neoadjuvant  Cycle 2 of Taxol  INTERVAL HISTORY: Elizabeth Miles 47 y.o. female returns for evaluation prior to cycle 2 of Taxol.  She is doing well today.  she denies fevers, chills, vomiting, constipation, numbness, or any further concerns.  Remainder of the 10 point review of systems is negative  MEDICAL HISTORY: Past Medical History  Diagnosis Date  . 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 GERD (gastroesophageal reflux disease)   . GERD (gastroesophageal reflux disease) 08/22/2012  . Allergy   . Complication of anesthesia     1986 ; problem waking up  . Anxiety     ALLERGIES:  is allergic to darvocet; shellfish allergy; and other.  MEDICATIONS:  Current Outpatient Prescriptions  Medication Sig Dispense Refill  . Dexlansoprazole (DEXILANT) 30 MG capsule Take 1 capsule (30 mg total) by mouth daily.  30 capsule  7  . ibuprofen (ADVIL,MOTRIN) 200 MG tablet Take 200 mg by mouth every 4 (four) hours as needed for pain.      Marland Kitchen lidocaine-prilocaine (EMLA) cream Apply topically as needed.  30 g  7  . loratadine (CLARITIN) 10 MG  tablet Take 10 mg by mouth as needed for allergies.      Marland Kitchen acetaminophen (TYLENOL) 325 MG tablet Take 650 mg by mouth every 4 (four) hours as needed for pain.      . ciprofloxacin (CIPRO) 500 MG tablet Take 1 tablet (500 mg total) by mouth 2 (two) times daily.  28 tablet  5  . hyaluronate sodium (RADIAPLEXRX) GEL Apply 1 application topically once. Apply after rad txs and bedtime,prn      . hydrocortisone-pramoxine (PROCTOFOAM-HC) rectal foam Place 1 applicator rectally 2 (two) times daily.  10 g  0  . LORazepam (ATIVAN) 0.5 MG tablet       . triamcinolone cream (KENALOG) 0.1 % Apply topically 2 (two) times daily.  30 g  0   No current facility-administered medications for this visit.    SURGICAL HISTORY:  Past Surgical History  Procedure Laterality Date  . Dilation and curettage of uterus  1993  . Cervical ablation  1983  . Breast biopsy Right 08/06/12  . Popliteal synovial cyst excision  1970  . Portacath placement Left 09/12/2012    Procedure: INSERTION PORT-A-CATH;  Surgeon: Emelia Loron, MD;  Location: [facility] OR;  Service: General;  Laterality: Left;    REVIEW OF SYSTEMS:  Pertinent items are noted in HPI.   HEALTH MAINTENANCE:   PHYSICAL EXAMINATION: Blood pressure 134/85, pulse 93, temperature 97.4 Elizabeth (36.3 Miles), temperature source Oral, resp. rate 18, height 5\' 7"  (1.702 m), weight 191 lb 6.4 oz (86.818 kg). Body mass index is 29.97 kg/(m^2). General: Patient is a well appearing female in no acute distress HEENT: PERRLA, sclerae anicteric no conjunctival pallor, MMM Neck: supple, no palpable adenopathy Lungs: clear to auscultation bilaterally, no wheezes, rhonchi, or rales Cardiovascular: regular rate rhythm, S1, S2, no murmurs, rubs or gallops Abdomen: Soft, non-tender, non-distended, normoactive bowel sounds, no HSM Extremities: warm and well perfused, no clubbing, cyanosis, or edema Skin: No rashes or lesions Neuro: Non-focal ECOG PERFORMANCE STATUS: 1 - Symptomatic but  completely ambulatory   LABORATORY DATA: Lab Results  Component Value Date   WBC 11.5* 11/08/2012   HGB 10.4* 11/08/2012   HCT 30.7* 11/08/2012   MCV 91.0 11/08/2012   PLT 280 11/08/2012      Chemistry      Component Value Date/Time   NA 140 11/01/2012 1446   K 3.6 11/01/2012 1446   CO2 26 11/01/2012 1446   BUN 10.8 11/01/2012 1446   CREATININE 0.7 11/01/2012 1446      Component Value Date/Time   CALCIUM 9.1 11/01/2012 1446   ALKPHOS 95 11/01/2012 1446   AST 11 11/01/2012 1446   ALT 15 11/01/2012 1446   BILITOT 0.28 11/01/2012 1446       RADIOGRAPHIC STUDIES:  Ct Chest W Contrast  08/29/2012   *RADIOLOGY REPORT*  Clinical Data:  Breast cancer.  CT CHEST, ABDOMEN AND PELVIS WITH CONTRAST  Technique:  Multidetector CT imaging of the chest, abdomen and pelvis was performed following the standard protocol during bolus administration of intravenous contrast.  Contrast: OMNIPAQUE  IOHEXOL 300 MG/ML  SOLN  Comparison:  PET 08/29/2012.  CT CHEST  Findings:  No pathologically enlarged mediastinal, hilar, axillary or internal mammary lymph nodes.  Primary right breast mass measures 3.1 x 3.4 cm.  There is a smaller nodule along the superior medial margin of the primary mass, measuring 1.2 x 1.3 cm. Heart size normal.  No pericardial effusion.  Minimal biapical pleural parenchymal scarring.  An oblong 4 mm (2 x 6 mm) nodule along the left major fissure is most consistent with a subpleural lymph node.  Lungs are otherwise clear.  No pleural fluid.  Airway is unremarkable.  IMPRESSION: Right breast lesions, consistent with the given history of breast cancer, without evidence of metastatic disease.  CT ABDOMEN AND PELVIS  Findings:  Liver, gallbladder, adrenal glands, kidneys, spleen, pancreas, stomach and small bowel are unremarkable.  Appendix is normal.  A fair amount of stool is seen in the colon, indicative of constipation.  No pathologically enlarged lymph nodes.  Uterus and ovaries are visualized.  No free  fluid.  No worrisome lytic or sclerotic lesions.  IMPRESSION: No evidence of metastatic disease in the abdomen or pelvis.   Original Report Authenticated By: Leanna Battles, M.D.   Ct Abdomen Pelvis W Contrast  08/29/2012   *RADIOLOGY REPORT*  Clinical Data:  Breast cancer.  CT CHEST, ABDOMEN AND PELVIS WITH CONTRAST  Technique:  Multidetector CT imaging of the chest, abdomen and pelvis was performed following the standard protocol during bolus administration of intravenous contrast.  Contrast: OMNIPAQUE IOHEXOL 300 MG/ML  SOLN  Comparison:  PET 08/29/2012.  CT CHEST  Findings:  No pathologically enlarged mediastinal, hilar, axillary or internal mammary lymph nodes.  Primary right breast mass measures 3.1 x 3.4 cm.  There is a smaller nodule along the superior medial margin of the primary mass, measuring 1.2 x 1.3 cm. Heart size normal.  No pericardial effusion.  Minimal biapical pleural parenchymal scarring.  An oblong 4 mm (2 x 6 mm) nodule along the left major fissure is most consistent with a subpleural lymph node.  Lungs are otherwise clear.  No pleural fluid.  Airway is unremarkable.  IMPRESSION: Right breast lesions, consistent with the given history of breast cancer, without evidence of metastatic disease.  CT ABDOMEN AND PELVIS  Findings:  Liver, gallbladder, adrenal glands, kidneys, spleen, pancreas, stomach and small bowel are unremarkable.  Appendix is normal.  A fair amount of stool is seen in the colon, indicative of constipation.  No pathologically enlarged lymph nodes.  Uterus and ovaries are visualized.  No free fluid.  No worrisome lytic or sclerotic lesions.  IMPRESSION: No evidence of metastatic disease in the abdomen or pelvis.   Original Report Authenticated By: Leanna Battles, M.D.   Mr Breast Bilateral W Wo Contrast  08/27/2012   *RADIOLOGY REPORT*  Clinical Data: Newly-diagnosed right breast invasive ductal carcinoma manifesting as a palpable mass.  BILATERAL BREAST MRI WITH AND  WITHOUT CONTRAST  Technique: Multiplanar, multisequence MR images of both breasts were obtained prior to and following the intravenous administration of 16ml of Multihance.  Three dimensional images were evaluated at the independent DynaCad workstation.  Comparison:  [place] mammograms 2014 and 2012.  We are not provided with post biopsy mammograms to indicate whether a clip was placed.  Findings: Background parenchymal enhancement pattern is mild. Breast parenchymal pattern suggests heterogeneously dense parenchyma.  In the right breast upper outer quadrant is an irregular lobulated mass with central T2 hyperintensity suggesting necrosis or biopsy  artifact, with a satellite nodule or lobulation within 2 mm at its superior aspect, measured together as 4.5 x 4.0 x 3.8 cm.  This demonstrates washin/washout type enhancement kinetics.  This corresponds to the reported biopsy-proven breast cancer.  Internal signal voids are noted which may indicate gas, clip artifact, or less likely calcification.  There is extension of enhancement to the nipple, suggesting nipple involvement may be present.  No other area of abnormal enhancement is seen in either breast.  No lymphadenopathy or T2-weighted hyperintensity elsewhere in either breast.  IMPRESSION: Dominant right breast upper outer quadrant irregular mass corresponding to the biopsy-proven breast cancer, measuring 4.5 cm in total and including the satellite nodule or lobulation at its superior aspect, which is within 2 mm of the dominant mass.  It is not clear from the provided images whether a clip was placed at prior biopsy.  If the patient is to undergo neoadjuvant chemotherapy, placement of a clip is highly recommended, because the mass could become occult on imaging if there is a favorable treatment response, and therefore may not be accurately localized if lumpectomy is planned.  Extension of enhancement from the mass to the right nipple may suggest nipple  involvement.  No evidence for contralateral left-sided abnormal enhancement.  RECOMMENDATION: Treatment plan  THREE-DIMENSIONAL MR IMAGE RENDERING ON INDEPENDENT WORKSTATION:  Three-dimensional MR images were rendered by post-processing of the original MR data on an independent workstation.  The three- dimensional MR images were interpreted, and findings were reported in the accompanying complete MRI report for this study.  BI-RADS CATEGORY 6:  Known biopsy-proven malignancy - appropriate action should be taken.   Original Report Authenticated By: Christiana Pellant, M.D.   Nm Pet Image Initial (pi) Skull Base To Thigh  08/29/2012   *RADIOLOGY REPORT*  Clinical Data: Initial treatment strategy for breast cancer.  NUCLEAR MEDICINE PET SKULL BASE TO THIGH  Fasting Blood Glucose:  88  Technique:  17.6 mCi Elizabeth-18 FDG was injected intravenously. CT data was obtained and used for attenuation correction and anatomic localization only.  (This was not acquired as a diagnostic CT examination.) Additional exam technical data entered on technologist worksheet.  Comparison:  CT chest abdomen pelvis 08/29/2012.  Findings:  Neck: No hypermetabolic lymph nodes in the neck.  Hypermetabolic brown fat is noted.  CT images show no acute findings.  Chest:  No hypermetabolic mediastinal or hilar lymph nodes.  No suspicious pulmonary nodules.  There is hypermetabolic brown fat.  Primary right breast mass measures 3.2 x 3.3 cm with an S U V max of 16.0.  There is an adjacent nodule along the superomedial border of the primary mass, measuring 1.4 cm, also hypermetabolic.  No additional areas of abnormal hypermetabolism in the chest.  CT images show no acute findings.  Specifically, no pericardial or pleural effusion.  Abdomen/Pelvis:  No abnormal hypermetabolic activity within the liver, pancreas, adrenal glands or spleen.  No hypermetabolic lymph nodes.  CT images show no acute findings.  Skeleton:  No focal hypermetabolic activity to suggest  skeletal metastasis.  IMPRESSION: Hypermetabolic right breast lesions, consistent with the given history breast cancer, without evidence of metastatic disease.   Original Report Authenticated By: Leanna Battles, M.D.   Dg Chest Port 1 View  09/12/2012   *RADIOLOGY REPORT*  Clinical Data: Port-A-Cath placement  PORTABLE CHEST - 1 VIEW  Comparison: CT chest 08/29/2012  Findings: Left subclavian Port-A-Cath tip in the mid SVC.  No pneumothorax  The lungs are clear.  IMPRESSION: Satisfactory  Port-A-Cath placement.   Original Report Authenticated By: Janeece Riggers, M.D.   Dg Fluoro Guide Cv Line-no Report  09/12/2012   CLINICAL DATA: right breast mass   FLOURO GUIDE CV LINE  Fluoroscopy was utilized by the requesting physician.  No radiographic  interpretation.     ASSESSMENT: 47 year old female with  #1 T2 NX MX invasive ductal carcinoma of the breast found a self breast examination of the right breast. She is a good candidate for neoadjuvant chemotherapy. We discussed treatment with Adriamycin Cytoxan followed by Taxol. She will receive dose dense a.Miles. for 4 cycles followed by Taxol weekly for 12 weeks. She is scheduled to have a Port-A-Cath placed next week by Dr. Emelia Loron. She desires to begin treatment as soon as possible.  #2 we discussed her CT scan results and PET scan results as well as her MRI results today.  #3 patient has begun neoadjuvant chemotherapy with AC on 7/18. Total 4 cycles planned, after completion of AC she will begin single agent taxol weekly x 12 weeks   PLAN:  #1 proceed with cycle 2 of Taxol.  #2 she will be seen back in one week's time for followup and cycle 3 of her scheduled chemotherapy  All questions were answered. The patient knows to call the clinic with any problems, questions or concerns. We can certainly see the patient much sooner if necessary.  I spent 25 minutes counseling the patient face to face. The total time spent in the appointment was 30  minutes.  Drue Second, MD Medical/Oncology [facility] [phone] (beeper) [phone] (Office)  11/17/2012, 3:46 AM

## 2012-11-09 ENCOUNTER — Ambulatory Visit: Payer: BC Managed Care – PPO

## 2012-11-11 ENCOUNTER — Telehealth: Payer: Self-pay | Admitting: *Deleted

## 2012-11-11 NOTE — Telephone Encounter (Signed)
PT.'S ONLY COMPLAINT WAS A LITTLE TINGLING AND SWELLING IN HER FINGERS AND TOES. SHE DID WORK TODAY BUT PLANS TO REST AND ELEVATE HER FEET THIS EVENING. APPETITE IS GOOD AND PT. IS FORCING FLUIDS. SHE DOES HAVE HER CHEMOTHERAPY DRUG INFORMATION SHEET AS A REFERENCE. PT. WILL CALL THIS OFFICE OR THE ON CALL PHYSICIAN SHOULD THE OFFICE BE CLOSED FOR ANY PROBLEMS OR QUESTIONS.

## 2012-11-15 ENCOUNTER — Ambulatory Visit (HOSPITAL_BASED_OUTPATIENT_CLINIC_OR_DEPARTMENT_OTHER): Payer: BC Managed Care – PPO

## 2012-11-15 ENCOUNTER — Other Ambulatory Visit (HOSPITAL_BASED_OUTPATIENT_CLINIC_OR_DEPARTMENT_OTHER): Payer: BC Managed Care – PPO | Admitting: Lab

## 2012-11-15 ENCOUNTER — Ambulatory Visit (HOSPITAL_BASED_OUTPATIENT_CLINIC_OR_DEPARTMENT_OTHER): Payer: BC Managed Care – PPO | Admitting: Oncology

## 2012-11-15 VITALS — BP 127/84 | HR 87 | Temp 98.2°F | Resp 20 | Ht 67.0 in | Wt 190.3 lb

## 2012-11-15 VITALS — BP 120/79 | HR 85 | Resp 20

## 2012-11-15 DIAGNOSIS — C50419 Malignant neoplasm of upper-outer quadrant of unspecified female breast: Secondary | ICD-10-CM

## 2012-11-15 DIAGNOSIS — C50919 Malignant neoplasm of unspecified site of unspecified female breast: Secondary | ICD-10-CM

## 2012-11-15 DIAGNOSIS — G5690 Unspecified mononeuropathy of unspecified upper limb: Secondary | ICD-10-CM

## 2012-11-15 DIAGNOSIS — G5790 Unspecified mononeuropathy of unspecified lower limb: Secondary | ICD-10-CM

## 2012-11-15 DIAGNOSIS — C50911 Malignant neoplasm of unspecified site of right female breast: Secondary | ICD-10-CM

## 2012-11-15 DIAGNOSIS — K219 Gastro-esophageal reflux disease without esophagitis: Secondary | ICD-10-CM

## 2012-11-15 DIAGNOSIS — Z170 Estrogen receptor positive status [ER+]: Secondary | ICD-10-CM

## 2012-11-15 DIAGNOSIS — Z5111 Encounter for antineoplastic chemotherapy: Secondary | ICD-10-CM

## 2012-11-15 LAB — CBC WITH DIFFERENTIAL/PLATELET
BASO%: 1.4 % (ref 0.0–2.0)
EOS%: 0.3 % (ref 0.0–7.0)
Eosinophils Absolute: 0 10*3/uL (ref 0.0–0.5)
LYMPH%: 16.8 % (ref 14.0–49.7)
MCHC: 33.8 g/dL (ref 31.5–36.0)
MCV: 91.4 fL (ref 79.5–101.0)
MONO%: 12.3 % (ref 0.0–14.0)
NEUT#: 2.7 10*3/uL (ref 1.5–6.5)
Platelets: 390 10*3/uL (ref 145–400)
RBC: 3.44 10*6/uL — ABNORMAL LOW (ref 3.70–5.45)
RDW: 16.7 % — ABNORMAL HIGH (ref 11.2–14.5)

## 2012-11-15 LAB — COMPREHENSIVE METABOLIC PANEL (CC13)
ALT: 26 U/L (ref 0–55)
AST: 19 U/L (ref 5–34)
Albumin: 3.9 g/dL (ref 3.5–5.0)
Alkaline Phosphatase: 60 U/L (ref 40–150)
Glucose: 89 mg/dl (ref 70–140)
Potassium: 3.8 mEq/L (ref 3.5–5.1)
Sodium: 141 mEq/L (ref 136–145)
Total Bilirubin: 0.25 mg/dL (ref 0.20–1.20)
Total Protein: 7 g/dL (ref 6.4–8.3)

## 2012-11-15 MED ORDER — DEXAMETHASONE SODIUM PHOSPHATE 10 MG/ML IJ SOLN
10.0000 mg | Freq: Once | INTRAMUSCULAR | Status: AC
  Administered 2012-11-15: 10 mg via INTRAVENOUS

## 2012-11-15 MED ORDER — SODIUM CHLORIDE 0.9 % IJ SOLN
10.0000 mL | INTRAMUSCULAR | Status: DC | PRN
  Administered 2012-11-15: 10 mL
  Filled 2012-11-15: qty 10

## 2012-11-15 MED ORDER — PACLITAXEL PROTEIN-BOUND CHEMO INJECTION 100 MG
100.0000 mg/m2 | Freq: Once | INTRAVENOUS | Status: AC
  Administered 2012-11-15: 200 mg via INTRAVENOUS
  Filled 2012-11-15: qty 40

## 2012-11-15 MED ORDER — ONDANSETRON 8 MG/50ML IVPB (CHCC)
8.0000 mg | Freq: Once | INTRAVENOUS | Status: AC
  Administered 2012-11-15: 8 mg via INTRAVENOUS

## 2012-11-15 MED ORDER — ONDANSETRON 8 MG/NS 50 ML IVPB
INTRAVENOUS | Status: AC
  Filled 2012-11-15: qty 8

## 2012-11-15 MED ORDER — DEXAMETHASONE SODIUM PHOSPHATE 10 MG/ML IJ SOLN
INTRAMUSCULAR | Status: AC
  Filled 2012-11-15: qty 1

## 2012-11-15 MED ORDER — SODIUM CHLORIDE 0.9 % IV SOLN
Freq: Once | INTRAVENOUS | Status: AC
  Administered 2012-11-15: 13:00:00 via INTRAVENOUS

## 2012-11-15 MED ORDER — HEPARIN SOD (PORK) LOCK FLUSH 100 UNIT/ML IV SOLN
500.0000 [IU] | Freq: Once | INTRAVENOUS | Status: AC | PRN
Start: 2012-11-15 — End: 2012-11-15
  Administered 2012-11-15: 500 [IU]
  Filled 2012-11-15: qty 5

## 2012-11-15 NOTE — Progress Notes (Signed)
OFFICE PROGRESS NOTE  CC  [name],[name], MD [address] [place] [address] Dr. Reuel Boom Pomposini  Dr. Emelia Loron  Dr. Lurline Hare  DIAGNOSIS: 47 year old female with new diagnosis of stage II right breast cancer  STAGE:  Right breast  Stage II,T2NxMx  ER+PR-Her2Neu-  Ki-67 high   PRIOR THERAPY:  #1June 2013 had a mammogram that was normal. But there was on physical exam possibility of a cyst noted in the right breast. The mammogram was negative. In 2014 June patient noted on exam another lump in the right breast. She underwent a diagnostic mammogram on June 10 that showed a right breast nodule in the outer quadrant. She had an ultrasound performed that showed at the 9:30 o'clock position a 3.6 cm area and then added 10:00 position 1.3 cm area with a total area being anywhere between 5-6 cm. The patient went on to have a right breast biopsy performed in [place]. The pathology revealed an invasive ductal carcinoma. This is been confirmed by our pathology as well. The carcinoma and papillary features and was felt to be between a grade 1 and 2. The tumor was estrogen receptor positive strongly (100%) progesterone receptor negative HER-2/neu negative with a Ki-67 that showed a high proliferation rate.  #2 patient was seen by me on 08/22/2012 to discuss treatment options. Due to the size of the tumor I had recommended that patient proceed with neoadjuvant treatment initially consisting of chemotherapy. However due to the workup being incomplete I did recommend that she undergo MRI of the breasts as well as staging studies. When she has had. MRI of the breasts performed on 08/27/2012 revealed in the right upper quadrant irregular lobulated mass with a satellite nodule in ambulation within 2 mm at its superior aspect measured together as 4.5 x 4.0 x 3.8 cm. No lymphadenopathy was noted there was no any other area of abnormal enhancement in either breast. Patient also had PET scans  performed on 73 that revealed the primary breast cancer measuring 3.2 x 3.3 cm with SUV of 16. It was adjacent nodule along his pure medial border of the primary mass measuring 1.4 cm which was also hypermetabolic. There were no additional areas of abnormal hypermetabolism In the chest. Abdomen and pelvis showed no abnormal hypermetabolic activity within the liver pancreas adrenal glands or spleen. No hypermetabolic lymph nodes. In the skeleton no focal hypermetabolic activity to suggest skeletal metastasis.  #3 patient was also seen by Dr. Emelia Loron as well as Dr. Lurline Hare in consultation. Dr. Dwain Sarna has agreed to place a port for eventual chemotherapy. Patient also has had chemotherapy teaching class. The port will be placed on 09/12/2012.  #4. Status post Neoadjuvant AC q 2 weeks on 7/18 she completed this on 10/25/12.    #5 received one dose of Taxol on 11/08/2012 developed significant grade 2 neuropathy.  # 6 patient will begin single agent Abraxane day 1, 8, 15 on a 28 day cycle.  CURRENT THERAPY: Abraxane day 1  INTERVAL HISTORY: Elizabeth Miles 47 y.o. female returns for evaluation for follow up prior to cycle 2 of Taxol. 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 It has been persistent. The neuropathy has started to go up on her legs. This is due to her Taxol but she was administered. She did have this prior to that as well during Adriamycin and Cytoxan administration. Patient and I discussed this in detail. I do think we need to discontinue the  Taxol and perhaps start her on Abraxane. We discussed the rationale for this. We discussed risks and benefits of this. I have gotten preauthorization started as well. Remainder of the 10 point review of systems is negative. MEDICAL HISTORY: Past Medical History  Diagnosis Date  . 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 GERD (gastroesophageal reflux disease)   . GERD (gastroesophageal reflux  disease) 08/22/2012  . Allergy   . Complication of anesthesia     1986 ; problem waking up  . Anxiety     ALLERGIES:  is allergic to darvocet; shellfish allergy; and other.  MEDICATIONS:  Current Outpatient Prescriptions  Medication Sig Dispense Refill  . acetaminophen (TYLENOL) 325 MG tablet Take 650 mg by mouth every 4 (four) hours as needed for pain.      . ciprofloxacin (CIPRO) 500 MG tablet Take 1 tablet (500 mg total) by mouth 2 (two) times daily.  28 tablet  5  . Dexlansoprazole (DEXILANT) 30 MG capsule Take 1 capsule (30 mg total) by mouth daily.  30 capsule  7  . hyaluronate sodium (RADIAPLEXRX) GEL Apply 1 application topically once. Apply after rad txs and bedtime,prn      . hydrocortisone-pramoxine (PROCTOFOAM-HC) rectal foam Place 1 applicator rectally 2 (two) times daily.  10 g  0  . ibuprofen (ADVIL,MOTRIN) 200 MG tablet Take 200 mg by mouth every 4 (four) hours as needed for pain.      Marland Kitchen lidocaine-prilocaine (EMLA) cream Apply topically as needed.  30 g  7  . loratadine (CLARITIN) 10 MG tablet Take 10 mg by mouth as needed for allergies.      Marland Kitchen LORazepam (ATIVAN) 0.5 MG tablet       . triamcinolone cream (KENALOG) 0.1 % Apply topically 2 (two) times daily.  30 g  0   No current facility-administered medications for this visit.    SURGICAL HISTORY:  Past Surgical History  Procedure Laterality Date  . Dilation and curettage of uterus  1993  . Cervical ablation  1983  . Breast biopsy Right 08/06/12  . Popliteal synovial cyst excision  1970  . Portacath placement Left 09/12/2012    Procedure: INSERTION PORT-A-CATH;  Surgeon: Emelia Loron, MD;  Location: [facility] OR;  Service: General;  Laterality: Left;    REVIEW OF SYSTEMS:  Pertinent items are noted in HPI.   HEALTH MAINTENANCE:   PHYSICAL EXAMINATION: Blood pressure 127/84, pulse 87, temperature 98.2 F (36.8 C), temperature source Oral, resp. rate 20, height 5\' 7"  (1.702 m), weight 190 lb 4.8 oz (86.32 kg).  Body mass index is 29.8 kg/(m^2). General: Patient is a well appearing female in no acute distress HEENT: PERRLA, sclerae anicteric no conjunctival pallor, MMM Neck: supple, no palpable adenopathy Lungs: clear to auscultation bilaterally, no wheezes, rhonchi, or rales Cardiovascular: regular rate rhythm, S1, S2, no murmurs, rubs or gallops Abdomen: Soft, non-tender, non-distended, normoactive bowel sounds, no HSM Extremities: warm and well perfused, no clubbing, cyanosis, or edema Skin: No rashes or lesions Neuro: Non-focal ECOG PERFORMANCE STATUS: 1 - Symptomatic but completely ambulatory   LABORATORY DATA: Lab Results  Component Value Date   WBC 3.8* 11/15/2012   HGB 10.7* 11/15/2012   HCT 31.5* 11/15/2012   MCV 91.4 11/15/2012   PLT 390 11/15/2012      Chemistry      Component Value Date/Time   NA 141 11/15/2012 1028   K 3.8 11/15/2012 1028   CO2 26 11/15/2012 1028   BUN 7.0 11/15/2012  1028   CREATININE 0.7 11/15/2012 1028      Component Value Date/Time   CALCIUM 9.6 11/15/2012 1028   ALKPHOS 60 11/15/2012 1028   AST 19 11/15/2012 1028   ALT 26 11/15/2012 1028   BILITOT 0.25 11/15/2012 1028       RADIOGRAPHIC STUDIES:  Ct Chest W Contrast  08/29/2012   *RADIOLOGY REPORT*  Clinical Data:  Breast cancer.  CT CHEST, ABDOMEN AND PELVIS WITH CONTRAST  Technique:  Multidetector CT imaging of the chest, abdomen and pelvis was performed following the standard protocol during bolus administration of intravenous contrast.  Contrast: OMNIPAQUE IOHEXOL 300 MG/ML  SOLN  Comparison:  PET 08/29/2012.  CT CHEST  Findings:  No pathologically enlarged mediastinal, hilar, axillary or internal mammary lymph nodes.  Primary right breast mass measures 3.1 x 3.4 cm.  There is a smaller nodule along the superior medial margin of the primary mass, measuring 1.2 x 1.3 cm. Heart size normal.  No pericardial effusion.  Minimal biapical pleural parenchymal scarring.  An oblong 4 mm (2 x 6 mm) nodule along  the left major fissure is most consistent with a subpleural lymph node.  Lungs are otherwise clear.  No pleural fluid.  Airway is unremarkable.  IMPRESSION: Right breast lesions, consistent with the given history of breast cancer, without evidence of metastatic disease.  CT ABDOMEN AND PELVIS  Findings:  Liver, gallbladder, adrenal glands, kidneys, spleen, pancreas, stomach and small bowel are unremarkable.  Appendix is normal.  A fair amount of stool is seen in the colon, indicative of constipation.  No pathologically enlarged lymph nodes.  Uterus and ovaries are visualized.  No free fluid.  No worrisome lytic or sclerotic lesions.  IMPRESSION: No evidence of metastatic disease in the abdomen or pelvis.   Original Report Authenticated By: Leanna Battles, M.D.   Ct Abdomen Pelvis W Contrast  08/29/2012   *RADIOLOGY REPORT*  Clinical Data:  Breast cancer.  CT CHEST, ABDOMEN AND PELVIS WITH CONTRAST  Technique:  Multidetector CT imaging of the chest, abdomen and pelvis was performed following the standard protocol during bolus administration of intravenous contrast.  Contrast: OMNIPAQUE IOHEXOL 300 MG/ML  SOLN  Comparison:  PET 08/29/2012.  CT CHEST  Findings:  No pathologically enlarged mediastinal, hilar, axillary or internal mammary lymph nodes.  Primary right breast mass measures 3.1 x 3.4 cm.  There is a smaller nodule along the superior medial margin of the primary mass, measuring 1.2 x 1.3 cm. Heart size normal.  No pericardial effusion.  Minimal biapical pleural parenchymal scarring.  An oblong 4 mm (2 x 6 mm) nodule along the left major fissure is most consistent with a subpleural lymph node.  Lungs are otherwise clear.  No pleural fluid.  Airway is unremarkable.  IMPRESSION: Right breast lesions, consistent with the given history of breast cancer, without evidence of metastatic disease.  CT ABDOMEN AND PELVIS  Findings:  Liver, gallbladder, adrenal glands, kidneys, spleen, pancreas, stomach and small  bowel are unremarkable.  Appendix is normal.  A fair amount of stool is seen in the colon, indicative of constipation.  No pathologically enlarged lymph nodes.  Uterus and ovaries are visualized.  No free fluid.  No worrisome lytic or sclerotic lesions.  IMPRESSION: No evidence of metastatic disease in the abdomen or pelvis.   Original Report Authenticated By: Leanna Battles, M.D.   Mr Breast Bilateral W Wo Contrast  08/27/2012   *RADIOLOGY REPORT*  Clinical Data: Newly-diagnosed right breast invasive ductal carcinoma  manifesting as a palpable mass.  BILATERAL BREAST MRI WITH AND WITHOUT CONTRAST  Technique: Multiplanar, multisequence MR images of both breasts were obtained prior to and following the intravenous administration of 16ml of Multihance.  Three dimensional images were evaluated at the independent DynaCad workstation.  Comparison:  [place] mammograms 2014 and 2012.  We are not provided with post biopsy mammograms to indicate whether a clip was placed.  Findings: Background parenchymal enhancement pattern is mild. Breast parenchymal pattern suggests heterogeneously dense parenchyma.  In the right breast upper outer quadrant is an irregular lobulated mass with central T2 hyperintensity suggesting necrosis or biopsy artifact, with a satellite nodule or lobulation within 2 mm at its superior aspect, measured together as 4.5 x 4.0 x 3.8 cm.  This demonstrates washin/washout type enhancement kinetics.  This corresponds to the reported biopsy-proven breast cancer.  Internal signal voids are noted which may indicate gas, clip artifact, or less likely calcification.  There is extension of enhancement to the nipple, suggesting nipple involvement may be present.  No other area of abnormal enhancement is seen in either breast.  No lymphadenopathy or T2-weighted hyperintensity elsewhere in either breast.  IMPRESSION: Dominant right breast upper outer quadrant irregular mass corresponding to the biopsy-proven breast  cancer, measuring 4.5 cm in total and including the satellite nodule or lobulation at its superior aspect, which is within 2 mm of the dominant mass.  It is not clear from the provided images whether a clip was placed at prior biopsy.  If the patient is to undergo neoadjuvant chemotherapy, placement of a clip is highly recommended, because the mass could become occult on imaging if there is a favorable treatment response, and therefore may not be accurately localized if lumpectomy is planned.  Extension of enhancement from the mass to the right nipple may suggest nipple involvement.  No evidence for contralateral left-sided abnormal enhancement.  RECOMMENDATION: Treatment plan  THREE-DIMENSIONAL MR IMAGE RENDERING ON INDEPENDENT WORKSTATION:  Three-dimensional MR images were rendered by post-processing of the original MR data on an independent workstation.  The three- dimensional MR images were interpreted, and findings were reported in the accompanying complete MRI report for this study.  BI-RADS CATEGORY 6:  Known biopsy-proven malignancy - appropriate action should be taken.   Original Report Authenticated By: Christiana Pellant, M.D.   Nm Pet Image Initial (pi) Skull Base To Thigh  08/29/2012   *RADIOLOGY REPORT*  Clinical Data: Initial treatment strategy for breast cancer.  NUCLEAR MEDICINE PET SKULL BASE TO THIGH  Fasting Blood Glucose:  88  Technique:  17.6 mCi F-18 FDG was injected intravenously. CT data was obtained and used for attenuation correction and anatomic localization only.  (This was not acquired as a diagnostic CT examination.) Additional exam technical data entered on technologist worksheet.  Comparison:  CT chest abdomen pelvis 08/29/2012.  Findings:  Neck: No hypermetabolic lymph nodes in the neck.  Hypermetabolic brown fat is noted.  CT images show no acute findings.  Chest:  No hypermetabolic mediastinal or hilar lymph nodes.  No suspicious pulmonary nodules.  There is hypermetabolic brown  fat.  Primary right breast mass measures 3.2 x 3.3 cm with an S U V max of 16.0.  There is an adjacent nodule along the superomedial border of the primary mass, measuring 1.4 cm, also hypermetabolic.  No additional areas of abnormal hypermetabolism in the chest.  CT images show no acute findings.  Specifically, no pericardial or pleural effusion.  Abdomen/Pelvis:  No abnormal hypermetabolic activity within the  liver, pancreas, adrenal glands or spleen.  No hypermetabolic lymph nodes.  CT images show no acute findings.  Skeleton:  No focal hypermetabolic activity to suggest skeletal metastasis.  IMPRESSION: Hypermetabolic right breast lesions, consistent with the given history breast cancer, without evidence of metastatic disease.   Original Report Authenticated By: Leanna Battles, M.D.   Dg Chest Port 1 View  09/12/2012   *RADIOLOGY REPORT*  Clinical Data: Port-A-Cath placement  PORTABLE CHEST - 1 VIEW  Comparison: CT chest 08/29/2012  Findings: Left subclavian Port-A-Cath tip in the mid SVC.  No pneumothorax  The lungs are clear.  IMPRESSION: Satisfactory Port-A-Cath placement.   Original Report Authenticated By: Janeece Riggers, M.D.   Dg Fluoro Guide Cv Line-no Report  09/12/2012   CLINICAL DATA: right breast mass   FLOURO GUIDE CV LINE  Fluoroscopy was utilized by the requesting physician.  No radiographic  interpretation.     ASSESSMENT: 47 year old female with  #1 T2 NX MX invasive ductal carcinoma of the breast found a self breast examination of the right breast. She is a good candidate for neoadjuvant chemotherapy. We discussed treatment with Adriamycin Cytoxan followed by Taxol. She will receive dose dense a.c. for 4 cycles followed by Taxol weekly for 12 weeks. She is scheduled to have a Port-A-Cath placed next week by Dr. Emelia Loron. She desires to begin treatment as soon as possible.  #2 we discussed her CT scan results and PET scan results as well as her MRI results today.  #3s/p  neoadjuvant chemotherapy with AC on 7/18-. 10/25/2012 for a Total 4 cycles   #4 patient was begun on Taxol and adjuvantly. She received one cycle on 11/08/2012. On fortunately she's developed significant grade 2-3 neuropathy in her feet and fingertips.  #4 I have recommended she begin Abraxane days 18 and 15 on a 28 day cycle. Risks and benefits of this were discussed with the patient. Preauthorization is being obtained.  PLAN:  #1 proceed with Abraxane day 1  #2 she'll return in one week for next treatment.  #3 she will continue taking super B complex for her neuropathy.  #4 mild anemia continue to monitor.  All questions were answered. The patient knows to call the clinic with any problems, questions or concerns. We can certainly see the patient much sooner if necessary.  I spent 25 minutes counseling the patient face to face. The total time spent in the appointment was 30 minutes.

## 2012-11-15 NOTE — Patient Instructions (Signed)
[facility] Discharge Instructions for Patients Receiving Chemotherapy  Today you received the following chemotherapy agent Abraxane.  To help prevent nausea and vomiting after your treatment, we encourage you to take your nausea medication.   If you develop nausea and vomiting that is not controlled by your nausea medication, call the clinic.   BELOW ARE SYMPTOMS THAT SHOULD BE REPORTED IMMEDIATELY:  *FEVER GREATER THAN 100.5 F  *CHILLS WITH OR WITHOUT FEVER  NAUSEA AND VOMITING THAT IS NOT CONTROLLED WITH YOUR NAUSEA MEDICATION  *UNUSUAL SHORTNESS OF BREATH  *UNUSUAL BRUISING OR BLEEDING  TENDERNESS IN MOUTH AND THROAT WITH OR WITHOUT PRESENCE OF ULCERS  *URINARY PROBLEMS  *BOWEL PROBLEMS  UNUSUAL RASH Items with * indicate a potential emergency and should be followed up as soon as possible.  Feel free to call the clinic you have any questions or concerns. The clinic phone number is [phone].  Nanoparticle Albumin-Bound Paclitaxel injection What is this medicine? NANOPARTICLE ALBUMIN-BOUND PACLITAXEL (Na no PAHR ti kuhl al BYOO muhn-bound PAK li TAX el) is a chemotherapy drug. It targets fast dividing cells, like cancer cells, and causes these cells to die. This medicine is used to treat advanced breast cancer and advanced lung cancer. This medicine may be used for other purposes; ask your health care provider or pharmacist if you have questions. What should I tell my health care provider before I take this medicine? They need to know if you have any of these conditions: -kidney disease -liver disease -low blood counts, like low platelets, red blood cells, or white blood cells -recent or ongoing radiation therapy -an unusual or allergic reaction to paclitaxel, albumin, other chemotherapy, other medicines, foods, dyes, or preservatives -pregnant or trying to get pregnant -breast-feeding How should I use this medicine? This drug is given as an  infusion into a vein. It is administered in a hospital or clinic by a specially trained health care professional. Talk to your pediatrician regarding the use of this medicine in children. Special care may be needed. Overdosage: If you think you have taken too much of this medicine contact a poison control center or emergency room at once. NOTE: This medicine is only for you. Do not share this medicine with others. What if I miss a dose? It is important not to miss your dose. Call your doctor or health care professional if you are unable to keep an appointment. What may interact with this medicine? This medicine may also interact with the following medications: -cyclosporine -dexamethasone -diazepam -ketoconazole -medicines to increase blood counts like filgrastim, pegfilgrastim, sargramostim -other chemotherapy drugs like cisplatin, doxorubicin, epirubicin, etoposide, teniposide, vincristine -quinidine -testosterone -vaccines -verapamil Talk to your doctor or health care professional before taking any of these medicines: -acetaminophen -aspirin -ibuprofen -ketoprofen -naproxen This list may not describe all possible interactions. Give your health care provider a list of all the medicines, herbs, non-prescription drugs, or dietary supplements you use. Also tell them if you smoke, drink alcohol, or use illegal drugs. Some items may interact with your medicine. What should I watch for while using this medicine? Your condition will be monitored carefully while you are receiving this medicine. You will need important blood work done while you are taking this medicine. This drug may make you feel generally unwell. This is not uncommon, as chemotherapy can affect healthy cells as well as cancer cells. Report any side effects. Continue your course of treatment even though you feel ill unless your doctor tells you to stop. In   some cases, you may be given additional medicines to help with side  effects. Follow all directions for their use. Call your doctor or health care professional for advice if you get a fever, chills or sore throat, or other symptoms of a cold or flu. Do not treat yourself. This drug decreases your body's ability to fight infections. Try to avoid being around people who are sick. This medicine may increase your risk to bruise or bleed. Call your doctor or health care professional if you notice any unusual bleeding. Be careful brushing and flossing your teeth or using a toothpick because you may get an infection or bleed more easily. If you have any dental work done, tell your dentist you are receiving this medicine. Avoid taking products that contain aspirin, acetaminophen, ibuprofen, naproxen, or ketoprofen unless instructed by your doctor. These medicines may hide a fever. Do not become pregnant while taking this medicine. Women should inform their doctor if they wish to become pregnant or think they might be pregnant. There is a potential for serious side effects to an unborn child. Talk to your health care professional or pharmacist for more information. Do not breast-feed an infant while taking this medicine. Men are advised not to father a child while receiving this medicine. What side effects may I notice from receiving this medicine? Side effects that you should report to your doctor or health care professional as soon as possible: -allergic reactions like skin rash, itching or hives, swelling of the face, lips, or tongue -low blood counts - This drug may decrease the number of white blood cells, red blood cells and platelets. You may be at increased risk for infections and bleeding. -signs of infection - fever or chills, cough, sore throat, pain or difficulty passing urine -signs of decreased platelets or bleeding - bruising, pinpoint red spots on the skin, black, tarry stools, nosebleeds -signs of decreased red blood cells - unusually weak or tired, fainting  spells, lightheadedness -breathing problems -changes in vision -chest pain -high or low blood pressure -mouth sores -nausea and vomiting -pain, swelling, redness or irritation at the injection site -pain, tingling, numbness in the hands or feet -slow or irregular heartbeat -swelling of the ankle, feet, hands Side effects that usually do not require medical attention (report to your doctor or health care professional if they continue or are bothersome): -aches, pains -changes in the color of fingernails -diarrhea -hair loss -loss of appetite This list may not describe all possible side effects. Call your doctor for medical advice about side effects. You may report side effects to FDA at 1-800-FDA-1088. Where should I keep my medicine? This drug is given in a hospital or clinic and will not be stored at home. NOTE: This sheet is a summary. It may not cover all possible information. If you have questions about this medicine, talk to your doctor, pharmacist, or health care provider.  2013, [employer]. (12/19/2010 4:13:49 PM)   

## 2012-11-22 ENCOUNTER — Ambulatory Visit (INDEPENDENT_AMBULATORY_CARE_PROVIDER_SITE_OTHER): Payer: BC Managed Care – PPO | Admitting: General Surgery

## 2012-11-22 ENCOUNTER — Ambulatory Visit (HOSPITAL_BASED_OUTPATIENT_CLINIC_OR_DEPARTMENT_OTHER): Payer: BC Managed Care – PPO

## 2012-11-22 ENCOUNTER — Encounter (INDEPENDENT_AMBULATORY_CARE_PROVIDER_SITE_OTHER): Payer: Self-pay | Admitting: General Surgery

## 2012-11-22 ENCOUNTER — Other Ambulatory Visit (HOSPITAL_BASED_OUTPATIENT_CLINIC_OR_DEPARTMENT_OTHER): Payer: BC Managed Care – PPO | Admitting: Lab

## 2012-11-22 ENCOUNTER — Ambulatory Visit (HOSPITAL_BASED_OUTPATIENT_CLINIC_OR_DEPARTMENT_OTHER): Payer: BC Managed Care – PPO | Admitting: Oncology

## 2012-11-22 ENCOUNTER — Encounter: Payer: Self-pay | Admitting: Oncology

## 2012-11-22 VITALS — BP 132/78 | HR 81 | Temp 97.8°F | Resp 18 | Ht 67.0 in | Wt 192.0 lb

## 2012-11-22 VITALS — BP 125/83 | HR 105 | Temp 98.3°F | Resp 20 | Ht 67.0 in | Wt 194.1 lb

## 2012-11-22 DIAGNOSIS — C50911 Malignant neoplasm of unspecified site of right female breast: Secondary | ICD-10-CM

## 2012-11-22 DIAGNOSIS — C50919 Malignant neoplasm of unspecified site of unspecified female breast: Secondary | ICD-10-CM

## 2012-11-22 DIAGNOSIS — Z5111 Encounter for antineoplastic chemotherapy: Secondary | ICD-10-CM

## 2012-11-22 DIAGNOSIS — C50419 Malignant neoplasm of upper-outer quadrant of unspecified female breast: Secondary | ICD-10-CM

## 2012-11-22 DIAGNOSIS — Z170 Estrogen receptor positive status [ER+]: Secondary | ICD-10-CM

## 2012-11-22 DIAGNOSIS — G5690 Unspecified mononeuropathy of unspecified upper limb: Secondary | ICD-10-CM

## 2012-11-22 LAB — CBC WITH DIFFERENTIAL/PLATELET
BASO%: 2.3 % — ABNORMAL HIGH (ref 0.0–2.0)
Basophils Absolute: 0.1 10*3/uL (ref 0.0–0.1)
EOS%: 1.3 % (ref 0.0–7.0)
HCT: 32.8 % — ABNORMAL LOW (ref 34.8–46.6)
HGB: 11.2 g/dL — ABNORMAL LOW (ref 11.6–15.9)
LYMPH%: 16.9 % (ref 14.0–49.7)
MCH: 31.9 pg (ref 25.1–34.0)
MCHC: 34.1 g/dL (ref 31.5–36.0)
MCV: 93.3 fL (ref 79.5–101.0)
NEUT%: 71.7 % (ref 38.4–76.8)
Platelets: 316 10*3/uL (ref 145–400)
lymph#: 0.6 10*3/uL — ABNORMAL LOW (ref 0.9–3.3)

## 2012-11-22 LAB — COMPREHENSIVE METABOLIC PANEL (CC13)
ALT: 48 U/L (ref 0–55)
AST: 32 U/L (ref 5–34)
BUN: 6.7 mg/dL — ABNORMAL LOW (ref 7.0–26.0)
Calcium: 9.9 mg/dL (ref 8.4–10.4)
Chloride: 105 mEq/L (ref 98–109)
Creatinine: 0.7 mg/dL (ref 0.6–1.1)
Total Bilirubin: 0.41 mg/dL (ref 0.20–1.20)

## 2012-11-22 MED ORDER — ONDANSETRON 8 MG/NS 50 ML IVPB
INTRAVENOUS | Status: AC
  Filled 2012-11-22: qty 8

## 2012-11-22 MED ORDER — ONDANSETRON 8 MG/50ML IVPB (CHCC)
8.0000 mg | Freq: Once | INTRAVENOUS | Status: AC
  Administered 2012-11-22: 8 mg via INTRAVENOUS

## 2012-11-22 MED ORDER — SODIUM CHLORIDE 0.9 % IV SOLN
Freq: Once | INTRAVENOUS | Status: AC
  Administered 2012-11-22: 15:00:00 via INTRAVENOUS

## 2012-11-22 MED ORDER — DEXAMETHASONE SODIUM PHOSPHATE 10 MG/ML IJ SOLN
10.0000 mg | Freq: Once | INTRAMUSCULAR | Status: AC
  Administered 2012-11-22: 10 mg via INTRAVENOUS

## 2012-11-22 MED ORDER — SODIUM CHLORIDE 0.9 % IJ SOLN
10.0000 mL | INTRAMUSCULAR | Status: DC | PRN
  Administered 2012-11-22: 10 mL
  Filled 2012-11-22: qty 10

## 2012-11-22 MED ORDER — PACLITAXEL PROTEIN-BOUND CHEMO INJECTION 100 MG
100.0000 mg/m2 | Freq: Once | INTRAVENOUS | Status: AC
  Administered 2012-11-22: 200 mg via INTRAVENOUS
  Filled 2012-11-22: qty 40

## 2012-11-22 MED ORDER — DEXAMETHASONE SODIUM PHOSPHATE 10 MG/ML IJ SOLN
INTRAMUSCULAR | Status: AC
  Filled 2012-11-22: qty 1

## 2012-11-22 MED ORDER — HEPARIN SOD (PORK) LOCK FLUSH 100 UNIT/ML IV SOLN
500.0000 [IU] | Freq: Once | INTRAVENOUS | Status: AC | PRN
  Administered 2012-11-22: 500 [IU]
  Filled 2012-11-22: qty 5

## 2012-11-22 NOTE — Patient Instructions (Addendum)
Proceed with abraxane today  We will see you back in 1 week

## 2012-11-22 NOTE — Patient Instructions (Addendum)
[facility] Discharge Instructions for Patients Receiving Chemotherapy  Today you received the following chemotherapy agents Abraxane  To help prevent nausea and vomiting after your treatment, we encourage you to take your nausea medication as prescribed.   If you develop nausea and vomiting that is not controlled by your nausea medication, call the clinic.   BELOW ARE SYMPTOMS THAT SHOULD BE REPORTED IMMEDIATELY:  *FEVER GREATER THAN 100.5 F  *CHILLS WITH OR WITHOUT FEVER  NAUSEA AND VOMITING THAT IS NOT CONTROLLED WITH YOUR NAUSEA MEDICATION  *UNUSUAL SHORTNESS OF BREATH  *UNUSUAL BRUISING OR BLEEDING  TENDERNESS IN MOUTH AND THROAT WITH OR WITHOUT PRESENCE OF ULCERS  *URINARY PROBLEMS  *BOWEL PROBLEMS  UNUSUAL RASH Items with * indicate a potential emergency and should be followed up as soon as possible.  Feel free to call the clinic you have any questions or concerns. The clinic phone number is [phone].    

## 2012-11-22 NOTE — Progress Notes (Signed)
Subjective:     Patient ID: Elizabeth Miles, female   DOB: October 13, 1965, 47 y.o.   MRN: [id number]  HPI This is a 47 year old female who was diagnosed with a locally advanced right breast cancer. She has begun chemotherapy which she has had some difficulty with. She returns today where she describes as the mass being about half the size as it was previously. She's had no difficulties with her port. She has changed her regimen is now on abraxane seems and is not entirely sure of when she's going to be completed with her chemotherapy right now.  Review of Systems     Objective:   Physical Exam  Vitals reviewed. Constitutional: She appears well-developed and well-nourished.  Pulmonary/Chest: Right breast exhibits mass.    Lymphadenopathy:       Right axillary: No pectoral and no lateral adenopathy present.       Assessment:     Right breast cancer     Plan:     She has had a really good response.  We discussed surgery about 3 weeks after she completes chemotherapy.  I think she will be candidate for lumpectomy at that time and will await mr at completion to decide.  I will see her back with mr at completion of chemo.

## 2012-11-25 NOTE — Progress Notes (Signed)
OFFICE PROGRESS NOTE  CC  [name],[name], MD [address] [place] [address] Dr. Reuel Boom Pomposini  Dr. Emelia Loron  Dr. Lurline Hare  DIAGNOSIS: 47 year old female with new diagnosis of stage II right breast cancer  STAGE:  Right breast  Stage II,T2NxMx  ER+PR-Her2Neu-  Ki-67 high   PRIOR THERAPY:  #1June 2013 had a mammogram that was normal. But there was on physical exam possibility of a cyst noted in the right breast. The mammogram was negative. In 2014 June patient noted on exam another lump in the right breast. She underwent a diagnostic mammogram on June 10 that showed a right breast nodule in the outer quadrant. She had an ultrasound performed that showed at the 9:30 o'clock position a 3.6 cm area and then added 10:00 position 1.3 cm area with a total area being anywhere between 5-6 cm. The patient went on to have a right breast biopsy performed in [place]. The pathology revealed an invasive ductal carcinoma. This is been confirmed by our pathology as well. The carcinoma and papillary features and was felt to be between a grade 1 and 2. The tumor was estrogen receptor positive strongly (100%) progesterone receptor negative HER-2/neu negative with a Ki-67 that showed a high proliferation rate.  #2 patient was seen by me on 08/22/2012 to discuss treatment options. Due to the size of the tumor I had recommended that patient proceed with neoadjuvant treatment initially consisting of chemotherapy. However due to the workup being incomplete I did recommend that she undergo MRI of the breasts as well as staging studies. When she has had. MRI of the breasts performed on 08/27/2012 revealed in the right upper quadrant irregular lobulated mass with a satellite nodule in ambulation within 2 mm at its superior aspect measured together as 4.5 x 4.0 x 3.8 cm. No lymphadenopathy was noted there was no any other area of abnormal enhancement in either breast. Patient also had PET scans  performed on 73 that revealed the primary breast cancer measuring 3.2 x 3.3 cm with SUV of 16. It was adjacent nodule along his pure medial border of the primary mass measuring 1.4 cm which was also hypermetabolic. There were no additional areas of abnormal hypermetabolism In the chest. Abdomen and pelvis showed no abnormal hypermetabolic activity within the liver pancreas adrenal glands or spleen. No hypermetabolic lymph nodes. In the skeleton no focal hypermetabolic activity to suggest skeletal metastasis.  #3 patient was also seen by Dr. Emelia Loron as well as Dr. Lurline Hare in consultation. Dr. Dwain Sarna has agreed to place a port for eventual chemotherapy. Patient also has had chemotherapy teaching class. The port will be placed on 09/12/2012.  #4. Status post Neoadjuvant AC q 2 weeks on 7/18 she completed this on 10/25/12.    #5 received one dose of Taxol on 11/08/2012 developed significant grade 2 neuropathy.  # 6 patient will begin single agent Abraxane day 1, 8, 15 on a 28 day cycle.  CURRENT THERAPY:  Cycle 1Abraxane day 8  INTERVAL HISTORY: Elizabeth Miles 47 y.o. female returns for evaluation for follow up. Patient tolerated the cycle 1 day 1 of Abraxane very nicely. She feels much better. She has no tingling or numbness currently. No nausea vomiting fevers chills night sweats headaches shortness of breath chest pains palpitations she does have a little fatigue. Remainder of the 10 point review of systems is negative. MEDICAL HISTORY: Past Medical History  Diagnosis Date  . Breast cancer 08/06/12    invasive  ductal carcioma  . GERD (gastroesophageal reflux disease)   . GERD (gastroesophageal reflux disease) 08/22/2012  . Allergy   . Complication of anesthesia     1986 ; problem waking up  . Anxiety     ALLERGIES:  is allergic to darvocet; shellfish allergy; and other.  MEDICATIONS:  Current Outpatient Prescriptions  Medication Sig Dispense Refill  . acetaminophen  (TYLENOL) 325 MG tablet Take 650 mg by mouth every 4 (four) hours as needed for pain.      . ciprofloxacin (CIPRO) 500 MG tablet Take 1 tablet (500 mg total) by mouth 2 (two) times daily.  28 tablet  5  . Dexlansoprazole (DEXILANT) 30 MG capsule Take 1 capsule (30 mg total) by mouth daily.  30 capsule  7  . hyaluronate sodium (RADIAPLEXRX) GEL Apply 1 application topically once. Apply after rad txs and bedtime,prn      . hydrocortisone-pramoxine (PROCTOFOAM-HC) rectal foam Place 1 applicator rectally 2 (two) times daily.  10 g  0  . ibuprofen (ADVIL,MOTRIN) 200 MG tablet Take 200 mg by mouth every 4 (four) hours as needed for pain.      Marland Kitchen lidocaine-prilocaine (EMLA) cream Apply topically as needed.  30 g  7  . loratadine (CLARITIN) 10 MG tablet Take 10 mg by mouth as needed for allergies.      Marland Kitchen LORazepam (ATIVAN) 0.5 MG tablet       . ondansetron (ZOFRAN) 8 MG tablet       . triamcinolone cream (KENALOG) 0.1 % Apply topically 2 (two) times daily.  30 g  0   No current facility-administered medications for this visit.    SURGICAL HISTORY:  Past Surgical History  Procedure Laterality Date  . Dilation and curettage of uterus  1993  . Cervical ablation  1983  . Breast biopsy Right 08/06/12  . Popliteal synovial cyst excision  1970  . Portacath placement Left 09/12/2012    Procedure: INSERTION PORT-A-CATH;  Surgeon: Emelia Loron, MD;  Location: [facility] OR;  Service: General;  Laterality: Left;    REVIEW OF SYSTEMS:  Pertinent items are noted in HPI.   HEALTH MAINTENANCE:   PHYSICAL EXAMINATION: Blood pressure 125/83, pulse 105, temperature 98.3 F (36.8 C), temperature source Oral, resp. rate 20, height 5\' 7"  (1.702 m), weight 194 lb 1.6 oz (88.043 kg). Body mass index is 30.39 kg/(m^2). General: Patient is a well appearing female in no acute distress HEENT: PERRLA, sclerae anicteric no conjunctival pallor, MMM Neck: supple, no palpable adenopathy Lungs: clear to auscultation  bilaterally, no wheezes, rhonchi, or rales Cardiovascular: regular rate rhythm, S1, S2, no murmurs, rubs or gallops Abdomen: Soft, non-tender, non-distended, normoactive bowel sounds, no HSM Extremities: warm and well perfused, no clubbing, cyanosis, or edema Skin: No rashes or lesions Neuro: Non-focal ECOG PERFORMANCE STATUS: 1 - Symptomatic but completely ambulatory   LABORATORY DATA: Lab Results  Component Value Date   WBC 3.6* 11/22/2012   HGB 11.2* 11/22/2012   HCT 32.8* 11/22/2012   MCV 93.3 11/22/2012   PLT 316 11/22/2012      Chemistry      Component Value Date/Time   NA 142 11/22/2012 1302   K 4.1 11/22/2012 1302   CO2 26 11/22/2012 1302   BUN 6.7* 11/22/2012 1302   CREATININE 0.7 11/22/2012 1302      Component Value Date/Time   CALCIUM 9.9 11/22/2012 1302   ALKPHOS 57 11/22/2012 1302   AST 32 11/22/2012 1302   ALT 48 11/22/2012 1302   BILITOT  0.41 11/22/2012 1302       RADIOGRAPHIC STUDIES:  Ct Chest W Contrast  08/29/2012   *RADIOLOGY REPORT*  Clinical Data:  Breast cancer.  CT CHEST, ABDOMEN AND PELVIS WITH CONTRAST  Technique:  Multidetector CT imaging of the chest, abdomen and pelvis was performed following the standard protocol during bolus administration of intravenous contrast.  Contrast: OMNIPAQUE IOHEXOL 300 MG/ML  SOLN  Comparison:  PET 08/29/2012.  CT CHEST  Findings:  No pathologically enlarged mediastinal, hilar, axillary or internal mammary lymph nodes.  Primary right breast mass measures 3.1 x 3.4 cm.  There is a smaller nodule along the superior medial margin of the primary mass, measuring 1.2 x 1.3 cm. Heart size normal.  No pericardial effusion.  Minimal biapical pleural parenchymal scarring.  An oblong 4 mm (2 x 6 mm) nodule along the left major fissure is most consistent with a subpleural lymph node.  Lungs are otherwise clear.  No pleural fluid.  Airway is unremarkable.  IMPRESSION: Right breast lesions, consistent with the given history of breast cancer,  without evidence of metastatic disease.  CT ABDOMEN AND PELVIS  Findings:  Liver, gallbladder, adrenal glands, kidneys, spleen, pancreas, stomach and small bowel are unremarkable.  Appendix is normal.  A fair amount of stool is seen in the colon, indicative of constipation.  No pathologically enlarged lymph nodes.  Uterus and ovaries are visualized.  No free fluid.  No worrisome lytic or sclerotic lesions.  IMPRESSION: No evidence of metastatic disease in the abdomen or pelvis.   Original Report Authenticated By: Leanna Battles, M.D.   Ct Abdomen Pelvis W Contrast  08/29/2012   *RADIOLOGY REPORT*  Clinical Data:  Breast cancer.  CT CHEST, ABDOMEN AND PELVIS WITH CONTRAST  Technique:  Multidetector CT imaging of the chest, abdomen and pelvis was performed following the standard protocol during bolus administration of intravenous contrast.  Contrast: OMNIPAQUE IOHEXOL 300 MG/ML  SOLN  Comparison:  PET 08/29/2012.  CT CHEST  Findings:  No pathologically enlarged mediastinal, hilar, axillary or internal mammary lymph nodes.  Primary right breast mass measures 3.1 x 3.4 cm.  There is a smaller nodule along the superior medial margin of the primary mass, measuring 1.2 x 1.3 cm. Heart size normal.  No pericardial effusion.  Minimal biapical pleural parenchymal scarring.  An oblong 4 mm (2 x 6 mm) nodule along the left major fissure is most consistent with a subpleural lymph node.  Lungs are otherwise clear.  No pleural fluid.  Airway is unremarkable.  IMPRESSION: Right breast lesions, consistent with the given history of breast cancer, without evidence of metastatic disease.  CT ABDOMEN AND PELVIS  Findings:  Liver, gallbladder, adrenal glands, kidneys, spleen, pancreas, stomach and small bowel are unremarkable.  Appendix is normal.  A fair amount of stool is seen in the colon, indicative of constipation.  No pathologically enlarged lymph nodes.  Uterus and ovaries are visualized.  No free fluid.  No worrisome  lytic or sclerotic lesions.  IMPRESSION: No evidence of metastatic disease in the abdomen or pelvis.   Original Report Authenticated By: Leanna Battles, M.D.   Mr Breast Bilateral W Wo Contrast  08/27/2012   *RADIOLOGY REPORT*  Clinical Data: Newly-diagnosed right breast invasive ductal carcinoma manifesting as a palpable mass.  BILATERAL BREAST MRI WITH AND WITHOUT CONTRAST  Technique: Multiplanar, multisequence MR images of both breasts were obtained prior to and following the intravenous administration of 16ml of Multihance.  Three dimensional images were evaluated at  the independent DynaCad workstation.  Comparison:  [place] mammograms 2014 and 2012.  We are not provided with post biopsy mammograms to indicate whether a clip was placed.  Findings: Background parenchymal enhancement pattern is mild. Breast parenchymal pattern suggests heterogeneously dense parenchyma.  In the right breast upper outer quadrant is an irregular lobulated mass with central T2 hyperintensity suggesting necrosis or biopsy artifact, with a satellite nodule or lobulation within 2 mm at its superior aspect, measured together as 4.5 x 4.0 x 3.8 cm.  This demonstrates washin/washout type enhancement kinetics.  This corresponds to the reported biopsy-proven breast cancer.  Internal signal voids are noted which may indicate gas, clip artifact, or less likely calcification.  There is extension of enhancement to the nipple, suggesting nipple involvement may be present.  No other area of abnormal enhancement is seen in either breast.  No lymphadenopathy or T2-weighted hyperintensity elsewhere in either breast.  IMPRESSION: Dominant right breast upper outer quadrant irregular mass corresponding to the biopsy-proven breast cancer, measuring 4.5 cm in total and including the satellite nodule or lobulation at its superior aspect, which is within 2 mm of the dominant mass.  It is not clear from the provided images whether a clip was placed at  prior biopsy.  If the patient is to undergo neoadjuvant chemotherapy, placement of a clip is highly recommended, because the mass could become occult on imaging if there is a favorable treatment response, and therefore may not be accurately localized if lumpectomy is planned.  Extension of enhancement from the mass to the right nipple may suggest nipple involvement.  No evidence for contralateral left-sided abnormal enhancement.  RECOMMENDATION: Treatment plan  THREE-DIMENSIONAL MR IMAGE RENDERING ON INDEPENDENT WORKSTATION:  Three-dimensional MR images were rendered by post-processing of the original MR data on an independent workstation.  The three- dimensional MR images were interpreted, and findings were reported in the accompanying complete MRI report for this study.  BI-RADS CATEGORY 6:  Known biopsy-proven malignancy - appropriate action should be taken.   Original Report Authenticated By: Christiana Pellant, M.D.   Nm Pet Image Initial (pi) Skull Base To Thigh  08/29/2012   *RADIOLOGY REPORT*  Clinical Data: Initial treatment strategy for breast cancer.  NUCLEAR MEDICINE PET SKULL BASE TO THIGH  Fasting Blood Glucose:  88  Technique:  17.6 mCi F-18 FDG was injected intravenously. CT data was obtained and used for attenuation correction and anatomic localization only.  (This was not acquired as a diagnostic CT examination.) Additional exam technical data entered on technologist worksheet.  Comparison:  CT chest abdomen pelvis 08/29/2012.  Findings:  Neck: No hypermetabolic lymph nodes in the neck.  Hypermetabolic brown fat is noted.  CT images show no acute findings.  Chest:  No hypermetabolic mediastinal or hilar lymph nodes.  No suspicious pulmonary nodules.  There is hypermetabolic brown fat.  Primary right breast mass measures 3.2 x 3.3 cm with an S U V max of 16.0.  There is an adjacent nodule along the superomedial border of the primary mass, measuring 1.4 cm, also hypermetabolic.  No additional areas of  abnormal hypermetabolism in the chest.  CT images show no acute findings.  Specifically, no pericardial or pleural effusion.  Abdomen/Pelvis:  No abnormal hypermetabolic activity within the liver, pancreas, adrenal glands or spleen.  No hypermetabolic lymph nodes.  CT images show no acute findings.  Skeleton:  No focal hypermetabolic activity to suggest skeletal metastasis.  IMPRESSION: Hypermetabolic right breast lesions, consistent with the given history breast cancer,  without evidence of metastatic disease.   Original Report Authenticated By: Leanna Battles, M.D.   Dg Chest Port 1 View  09/12/2012   *RADIOLOGY REPORT*  Clinical Data: Port-A-Cath placement  PORTABLE CHEST - 1 VIEW  Comparison: CT chest 08/29/2012  Findings: Left subclavian Port-A-Cath tip in the mid SVC.  No pneumothorax  The lungs are clear.  IMPRESSION: Satisfactory Port-A-Cath placement.   Original Report Authenticated By: Janeece Riggers, M.D.   Dg Fluoro Guide Cv Line-no Report  09/12/2012   CLINICAL DATA: right breast mass   FLOURO GUIDE CV LINE  Fluoroscopy was utilized by the requesting physician.  No radiographic  interpretation.     ASSESSMENT: 47 year old female with  #1 T2 NX MX invasive ductal carcinoma of the breast found a self breast examination of the right breast. She is a good candidate for neoadjuvant chemotherapy. We discussed treatment with Adriamycin Cytoxan followed by Taxol. She will receive dose dense a.c. for 4 cycles followed by Taxol weekly for 12 weeks. She is scheduled to have a Port-A-Cath placed next week by Dr. Emelia Loron. She desires to begin treatment as soon as possible.  #2 we discussed her CT scan results and PET scan results as well as her MRI results today.  #3s/p neoadjuvant chemotherapy with AC on 7/18-. 10/25/2012 for a Total 4 cycles   #4 patient was begun on Taxol and adjuvantly. She received one cycle on 11/08/2012. 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  #4 I have recommended she begin Abraxane days 1, 8 and 15 on a 28 day cycle. Risks and benefits of this were discussed with the patient. Preauthorization is being obtained.  PLAN:  #1 proceed with Abraxane cycle 1day 8  #2 she'll return in one week for next treatment.  #3 she will continue taking super B complex for her neuropathy.  #4 mild anemia continue to monitor.  All questions were answered. The patient knows to call the clinic with any problems, questions or concerns. We can certainly see the patient much sooner if necessary.  I spent 25 minutes counseling the patient face to face. The total time spent in the appointment was 30 minutes.

## 2012-11-29 ENCOUNTER — Encounter: Payer: Self-pay | Admitting: Adult Health

## 2012-11-29 ENCOUNTER — Other Ambulatory Visit (HOSPITAL_BASED_OUTPATIENT_CLINIC_OR_DEPARTMENT_OTHER): Payer: BC Managed Care – PPO

## 2012-11-29 ENCOUNTER — Ambulatory Visit (HOSPITAL_BASED_OUTPATIENT_CLINIC_OR_DEPARTMENT_OTHER): Payer: BC Managed Care – PPO | Admitting: Adult Health

## 2012-11-29 ENCOUNTER — Ambulatory Visit (HOSPITAL_BASED_OUTPATIENT_CLINIC_OR_DEPARTMENT_OTHER): Payer: BC Managed Care – PPO

## 2012-11-29 VITALS — BP 112/78 | HR 92 | Temp 98.5°F | Resp 20 | Ht 67.0 in | Wt 193.0 lb

## 2012-11-29 DIAGNOSIS — C50419 Malignant neoplasm of upper-outer quadrant of unspecified female breast: Secondary | ICD-10-CM

## 2012-11-29 DIAGNOSIS — Z5111 Encounter for antineoplastic chemotherapy: Secondary | ICD-10-CM

## 2012-11-29 DIAGNOSIS — C50911 Malignant neoplasm of unspecified site of right female breast: Secondary | ICD-10-CM

## 2012-11-29 DIAGNOSIS — C50919 Malignant neoplasm of unspecified site of unspecified female breast: Secondary | ICD-10-CM

## 2012-11-29 DIAGNOSIS — Z170 Estrogen receptor positive status [ER+]: Secondary | ICD-10-CM

## 2012-11-29 LAB — COMPREHENSIVE METABOLIC PANEL (CC13)
Albumin: 3.8 g/dL (ref 3.5–5.0)
Alkaline Phosphatase: 49 U/L (ref 40–150)
BUN: 7.3 mg/dL (ref 7.0–26.0)
CO2: 29 mEq/L (ref 22–29)
Calcium: 9.5 mg/dL (ref 8.4–10.4)
Chloride: 106 mEq/L (ref 98–109)
Creatinine: 0.7 mg/dL (ref 0.6–1.1)
Glucose: 108 mg/dl (ref 70–140)
Total Protein: 6.9 g/dL (ref 6.4–8.3)

## 2012-11-29 LAB — CBC WITH DIFFERENTIAL/PLATELET
Basophils Absolute: 0.1 10*3/uL (ref 0.0–0.1)
EOS%: 4.8 % (ref 0.0–7.0)
Eosinophils Absolute: 0.1 10*3/uL (ref 0.0–0.5)
HCT: 32 % — ABNORMAL LOW (ref 34.8–46.6)
HGB: 10.8 g/dL — ABNORMAL LOW (ref 11.6–15.9)
MCH: 31.8 pg (ref 25.1–34.0)
MONO#: 0.3 10*3/uL (ref 0.1–0.9)
MONO%: 9.6 % (ref 0.0–14.0)
NEUT#: 1.9 10*3/uL (ref 1.5–6.5)
NEUT%: 64.5 % (ref 38.4–76.8)
Platelets: 328 10*3/uL (ref 145–400)
RBC: 3.41 10*6/uL — ABNORMAL LOW (ref 3.70–5.45)
RDW: 18.2 % — ABNORMAL HIGH (ref 11.2–14.5)
lymph#: 0.5 10*3/uL — ABNORMAL LOW (ref 0.9–3.3)

## 2012-11-29 MED ORDER — ONDANSETRON 8 MG/50ML IVPB (CHCC)
8.0000 mg | Freq: Once | INTRAVENOUS | Status: AC
  Administered 2012-11-29: 8 mg via INTRAVENOUS

## 2012-11-29 MED ORDER — SODIUM CHLORIDE 0.9 % IJ SOLN
10.0000 mL | INTRAMUSCULAR | Status: DC | PRN
  Administered 2012-11-29: 10 mL
  Filled 2012-11-29: qty 10

## 2012-11-29 MED ORDER — HEPARIN SOD (PORK) LOCK FLUSH 100 UNIT/ML IV SOLN
500.0000 [IU] | Freq: Once | INTRAVENOUS | Status: AC | PRN
  Administered 2012-11-29: 500 [IU]
  Filled 2012-11-29: qty 5

## 2012-11-29 MED ORDER — PACLITAXEL PROTEIN-BOUND CHEMO INJECTION 100 MG
100.0000 mg/m2 | Freq: Once | INTRAVENOUS | Status: AC
  Administered 2012-11-29: 200 mg via INTRAVENOUS
  Filled 2012-11-29: qty 40

## 2012-11-29 MED ORDER — ONDANSETRON 8 MG/NS 50 ML IVPB
INTRAVENOUS | Status: AC
  Filled 2012-11-29: qty 8

## 2012-11-29 MED ORDER — DEXAMETHASONE SODIUM PHOSPHATE 10 MG/ML IJ SOLN
INTRAMUSCULAR | Status: AC
  Filled 2012-11-29: qty 1

## 2012-11-29 MED ORDER — SODIUM CHLORIDE 0.9 % IV SOLN
Freq: Once | INTRAVENOUS | Status: AC
  Administered 2012-11-29: 15:00:00 via INTRAVENOUS

## 2012-11-29 MED ORDER — DEXAMETHASONE SODIUM PHOSPHATE 10 MG/ML IJ SOLN
10.0000 mg | Freq: Once | INTRAMUSCULAR | Status: AC
  Administered 2012-11-29: 10 mg via INTRAVENOUS

## 2012-11-29 NOTE — Patient Instructions (Signed)
[facility] Discharge Instructions for Patients Receiving Chemotherapy  Today you received the following chemotherapy agents Abraxane  To help prevent nausea and vomiting after your treatment, we encourage you to take your nausea medication as prescribed.   If you develop nausea and vomiting that is not controlled by your nausea medication, call the clinic.   BELOW ARE SYMPTOMS THAT SHOULD BE REPORTED IMMEDIATELY:  *FEVER GREATER THAN 100.5 F  *CHILLS WITH OR WITHOUT FEVER  NAUSEA AND VOMITING THAT IS NOT CONTROLLED WITH YOUR NAUSEA MEDICATION  *UNUSUAL SHORTNESS OF BREATH  *UNUSUAL BRUISING OR BLEEDING  TENDERNESS IN MOUTH AND THROAT WITH OR WITHOUT PRESENCE OF ULCERS  *URINARY PROBLEMS  *BOWEL PROBLEMS  UNUSUAL RASH Items with * indicate a potential emergency and should be followed up as soon as possible.  Feel free to call the clinic you have any questions or concerns. The clinic phone number is [phone].    

## 2012-11-29 NOTE — Progress Notes (Signed)
OFFICE PROGRESS NOTE  CC  [name],[name], MD [address] [place] [address] Dr. Reuel Boom Pomposini  Dr. Emelia Loron  Dr. Lurline Hare  DIAGNOSIS: 47 year old female with new diagnosis of stage II right breast cancer  STAGE:  Right breast  Stage II,T2NxMx  ER+PR-Her2Neu-  Ki-67 high   PRIOR THERAPY:  #1June 2013 had a mammogram that was normal. But there was on physical exam possibility of a cyst noted in the right breast. The mammogram was negative. In 2014 June patient noted on exam another lump in the right breast. She underwent a diagnostic mammogram on June 10 that showed a right breast nodule in the outer quadrant. She had an ultrasound performed that showed at the 9:30 o'clock position a 3.6 cm area and then added 10:00 position 1.3 cm area with a total area being anywhere between 5-6 cm. The patient went on to have a right breast biopsy performed in [place]. The pathology revealed an invasive ductal carcinoma. This is been confirmed by our pathology as well. The carcinoma and papillary features and was felt to be between a grade 1 and 2. The tumor was estrogen receptor positive strongly (100%) progesterone receptor negative HER-2/neu negative with a Ki-67 that showed a high proliferation rate.  #2 patient was seen by me on 08/22/2012 to discuss treatment options. Due to the size of the tumor I had recommended that patient proceed with neoadjuvant treatment initially consisting of chemotherapy. However due to the workup being incomplete I did recommend that she undergo MRI of the breasts as well as staging studies. When she has had. MRI of the breasts performed on 08/27/2012 revealed in the right upper quadrant irregular lobulated mass with a satellite nodule in ambulation within 2 mm at its superior aspect measured together as 4.5 x 4.0 x 3.8 cm. No lymphadenopathy was noted there was no any other area of abnormal enhancement in either breast. Patient also had PET scans  performed on 73 that revealed the primary breast cancer measuring 3.2 x 3.3 cm with SUV of 16. It was adjacent nodule along his pure medial border of the primary mass measuring 1.4 cm which was also hypermetabolic. There were no additional areas of abnormal hypermetabolism In the chest. Abdomen and pelvis showed no abnormal hypermetabolic activity within the liver pancreas adrenal glands or spleen. No hypermetabolic lymph nodes. In the skeleton no focal hypermetabolic activity to suggest skeletal metastasis.  #3 patient was also seen by Dr. Emelia Loron as well as Dr. Lurline Hare in consultation. Dr. Dwain Sarna has agreed to place a port for eventual chemotherapy. Patient also has had chemotherapy teaching class. The port will be placed on 09/12/2012.  #4. Status post Neoadjuvant AC q 2 weeks on 7/18 she completed this on 10/25/12.    #5 received one dose of Taxol on 11/08/2012 developed significant grade 2 neuropathy.  # 6 patient will begin single agent Abraxane day 1, 8, 15 on a 28 day cycle.   CURRENT THERAPY:  Cycle 1 Abraxane day 15  INTERVAL HISTORY: SHASTA CHINN 47 y.o. female returns for evaluation for follow up.  She is doing well today.  She denies fevers, chills, nausea, vomiting, constipation, diarrhea.  Her numbness is improved.  It occurs mostly in the morning and then resolves.  Otherwise, a 10 point ROS is neg.   MEDICAL HISTORY: Past Medical History  Diagnosis Date  . 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 GERD (gastroesophageal reflux disease)   .  GERD (gastroesophageal reflux disease) 08/22/2012  . Allergy   . Complication of anesthesia     1986 ; problem waking up  . Anxiety     ALLERGIES:  is allergic to darvocet; shellfish allergy; and other.  MEDICATIONS:  Current Outpatient Prescriptions  Medication Sig Dispense Refill  . acetaminophen (TYLENOL) 325 MG tablet Take 650 mg by mouth every 4 (four) hours as needed for pain.      .  ciprofloxacin (CIPRO) 500 MG tablet Take 1 tablet (500 mg total) by mouth 2 (two) times daily.  28 tablet  5  . Dexlansoprazole (DEXILANT) 30 MG capsule Take 1 capsule (30 mg total) by mouth daily.  30 capsule  7  . hyaluronate sodium (RADIAPLEXRX) GEL Apply 1 application topically once. Apply after rad txs and bedtime,prn      . hydrocortisone-pramoxine (PROCTOFOAM-HC) rectal foam Place 1 applicator rectally 2 (two) times daily.  10 g  0  . ibuprofen (ADVIL,MOTRIN) 200 MG tablet Take 200 mg by mouth every 4 (four) hours as needed for pain.      Marland Kitchen lidocaine-prilocaine (EMLA) cream Apply topically as needed.  30 g  7  . loratadine (CLARITIN) 10 MG tablet Take 10 mg by mouth as needed for allergies.      Marland Kitchen LORazepam (ATIVAN) 0.5 MG tablet       . ondansetron (ZOFRAN) 8 MG tablet       . triamcinolone cream (KENALOG) 0.1 % Apply topically 2 (two) times daily.  30 g  0   No current facility-administered medications for this visit.    SURGICAL HISTORY:  Past Surgical History  Procedure Laterality Date  . Dilation and curettage of uterus  1993  . Cervical ablation  1983  . Breast biopsy Right 08/06/12  . Popliteal synovial cyst excision  1970  . Portacath placement Left 09/12/2012    Procedure: INSERTION PORT-A-CATH;  Surgeon: Emelia Loron, MD;  Location: [facility] OR;  Service: General;  Laterality: Left;    REVIEW OF SYSTEMS:  A 10 point review of systems was conducted and is otherwise negative except for what is noted above.    PHYSICAL EXAMINATION: Blood pressure 112/78, pulse 92, temperature 98.5 F (36.9 C), temperature source Oral, resp. rate 20, height 5\' 7"  (1.702 m), weight 193 lb (87.544 kg). Body mass index is 30.22 kg/(m^2). General: Patient is a well appearing female in no acute distress HEENT: PERRLA, sclerae anicteric no conjunctival pallor, MMM Neck: supple, no palpable adenopathy Lungs: clear to auscultation bilaterally, no wheezes, rhonchi, or rales Cardiovascular: regular  rate rhythm, S1, S2, no murmurs, rubs or gallops Abdomen: Soft, non-tender, non-distended, normoactive bowel sounds, no HSM Extremities: warm and well perfused, no clubbing, cyanosis, or edema Skin: No rashes or lesions Neuro: Non-focal Breasts: right breast mass is softer ECOG PERFORMANCE STATUS: 1 - Symptomatic but completely ambulatory   LABORATORY DATA: Lab Results  Component Value Date   WBC 2.9* 11/29/2012   HGB 10.8* 11/29/2012   HCT 32.0* 11/29/2012   MCV 93.8 11/29/2012   PLT 328 11/29/2012      Chemistry      Component Value Date/Time   NA 142 11/22/2012 1302   K 4.1 11/22/2012 1302   CO2 26 11/22/2012 1302   BUN 6.7* 11/22/2012 1302   CREATININE 0.7 11/22/2012 1302      Component Value Date/Time   CALCIUM 9.9 11/22/2012 1302   ALKPHOS 57 11/22/2012 1302   AST 32 11/22/2012 1302   ALT 48 11/22/2012 1302  BILITOT 0.41 11/22/2012 1302       RADIOGRAPHIC STUDIES:  Ct Chest W Contrast  08/29/2012   *RADIOLOGY REPORT*  Clinical Data:  Breast cancer.  CT CHEST, ABDOMEN AND PELVIS WITH CONTRAST  Technique:  Multidetector CT imaging of the chest, abdomen and pelvis was performed following the standard protocol during bolus administration of intravenous contrast.  Contrast: OMNIPAQUE IOHEXOL 300 MG/ML  SOLN  Comparison:  PET 08/29/2012.  CT CHEST  Findings:  No pathologically enlarged mediastinal, hilar, axillary or internal mammary lymph nodes.  Primary right breast mass measures 3.1 x 3.4 cm.  There is a smaller nodule along the superior medial margin of the primary mass, measuring 1.2 x 1.3 cm. Heart size normal.  No pericardial effusion.  Minimal biapical pleural parenchymal scarring.  An oblong 4 mm (2 x 6 mm) nodule along the left major fissure is most consistent with a subpleural lymph node.  Lungs are otherwise clear.  No pleural fluid.  Airway is unremarkable.  IMPRESSION: Right breast lesions, consistent with the given history of breast cancer, without evidence of  metastatic disease.  CT ABDOMEN AND PELVIS  Findings:  Liver, gallbladder, adrenal glands, kidneys, spleen, pancreas, stomach and small bowel are unremarkable.  Appendix is normal.  A fair amount of stool is seen in the colon, indicative of constipation.  No pathologically enlarged lymph nodes.  Uterus and ovaries are visualized.  No free fluid.  No worrisome lytic or sclerotic lesions.  IMPRESSION: No evidence of metastatic disease in the abdomen or pelvis.   Original Report Authenticated By: Leanna Battles, M.D.   Ct Abdomen Pelvis W Contrast  08/29/2012   *RADIOLOGY REPORT*  Clinical Data:  Breast cancer.  CT CHEST, ABDOMEN AND PELVIS WITH CONTRAST  Technique:  Multidetector CT imaging of the chest, abdomen and pelvis was performed following the standard protocol during bolus administration of intravenous contrast.  Contrast: OMNIPAQUE IOHEXOL 300 MG/ML  SOLN  Comparison:  PET 08/29/2012.  CT CHEST  Findings:  No pathologically enlarged mediastinal, hilar, axillary or internal mammary lymph nodes.  Primary right breast mass measures 3.1 x 3.4 cm.  There is a smaller nodule along the superior medial margin of the primary mass, measuring 1.2 x 1.3 cm. Heart size normal.  No pericardial effusion.  Minimal biapical pleural parenchymal scarring.  An oblong 4 mm (2 x 6 mm) nodule along the left major fissure is most consistent with a subpleural lymph node.  Lungs are otherwise clear.  No pleural fluid.  Airway is unremarkable.  IMPRESSION: Right breast lesions, consistent with the given history of breast cancer, without evidence of metastatic disease.  CT ABDOMEN AND PELVIS  Findings:  Liver, gallbladder, adrenal glands, kidneys, spleen, pancreas, stomach and small bowel are unremarkable.  Appendix is normal.  A fair amount of stool is seen in the colon, indicative of constipation.  No pathologically enlarged lymph nodes.  Uterus and ovaries are visualized.  No free fluid.  No worrisome lytic or sclerotic  lesions.  IMPRESSION: No evidence of metastatic disease in the abdomen or pelvis.   Original Report Authenticated By: Leanna Battles, M.D.   Mr Breast Bilateral W Wo Contrast  08/27/2012   *RADIOLOGY REPORT*  Clinical Data: Newly-diagnosed right breast invasive ductal carcinoma manifesting as a palpable mass.  BILATERAL BREAST MRI WITH AND WITHOUT CONTRAST  Technique: Multiplanar, multisequence MR images of both breasts were obtained prior to and following the intravenous administration of 16ml of Multihance.  Three dimensional images were evaluated  at the independent DynaCad workstation.  Comparison:  [place] mammograms 2014 and 2012.  We are not provided with post biopsy mammograms to indicate whether a clip was placed.  Findings: Background parenchymal enhancement pattern is mild. Breast parenchymal pattern suggests heterogeneously dense parenchyma.  In the right breast upper outer quadrant is an irregular lobulated mass with central T2 hyperintensity suggesting necrosis or biopsy artifact, with a satellite nodule or lobulation within 2 mm at its superior aspect, measured together as 4.5 x 4.0 x 3.8 cm.  This demonstrates washin/washout type enhancement kinetics.  This corresponds to the reported biopsy-proven breast cancer.  Internal signal voids are noted which may indicate gas, clip artifact, or less likely calcification.  There is extension of enhancement to the nipple, suggesting nipple involvement may be present.  No other area of abnormal enhancement is seen in either breast.  No lymphadenopathy or T2-weighted hyperintensity elsewhere in either breast.  IMPRESSION: Dominant right breast upper outer quadrant irregular mass corresponding to the biopsy-proven breast cancer, measuring 4.5 cm in total and including the satellite nodule or lobulation at its superior aspect, which is within 2 mm of the dominant mass.  It is not clear from the provided images whether a clip was placed at prior biopsy.  If the  patient is to undergo neoadjuvant chemotherapy, placement of a clip is highly recommended, because the mass could become occult on imaging if there is a favorable treatment response, and therefore may not be accurately localized if lumpectomy is planned.  Extension of enhancement from the mass to the right nipple may suggest nipple involvement.  No evidence for contralateral left-sided abnormal enhancement.  RECOMMENDATION: Treatment plan  THREE-DIMENSIONAL MR IMAGE RENDERING ON INDEPENDENT WORKSTATION:  Three-dimensional MR images were rendered by post-processing of the original MR data on an independent workstation.  The three- dimensional MR images were interpreted, and findings were reported in the accompanying complete MRI report for this study.  BI-RADS CATEGORY 6:  Known biopsy-proven malignancy - appropriate action should be taken.   Original Report Authenticated By: Christiana Pellant, M.D.   Nm Pet Image Initial (pi) Skull Base To Thigh  08/29/2012   *RADIOLOGY REPORT*  Clinical Data: Initial treatment strategy for breast cancer.  NUCLEAR MEDICINE PET SKULL BASE TO THIGH  Fasting Blood Glucose:  88  Technique:  17.6 mCi F-18 FDG was injected intravenously. CT data was obtained and used for attenuation correction and anatomic localization only.  (This was not acquired as a diagnostic CT examination.) Additional exam technical data entered on technologist worksheet.  Comparison:  CT chest abdomen pelvis 08/29/2012.  Findings:  Neck: No hypermetabolic lymph nodes in the neck.  Hypermetabolic brown fat is noted.  CT images show no acute findings.  Chest:  No hypermetabolic mediastinal or hilar lymph nodes.  No suspicious pulmonary nodules.  There is hypermetabolic brown fat.  Primary right breast mass measures 3.2 x 3.3 cm with an S U V max of 16.0.  There is an adjacent nodule along the superomedial border of the primary mass, measuring 1.4 cm, also hypermetabolic.  No additional areas of abnormal  hypermetabolism in the chest.  CT images show no acute findings.  Specifically, no pericardial or pleural effusion.  Abdomen/Pelvis:  No abnormal hypermetabolic activity within the liver, pancreas, adrenal glands or spleen.  No hypermetabolic lymph nodes.  CT images show no acute findings.  Skeleton:  No focal hypermetabolic activity to suggest skeletal metastasis.  IMPRESSION: Hypermetabolic right breast lesions, consistent with the given history breast  cancer, without evidence of metastatic disease.   Original Report Authenticated By: Leanna Battles, M.D.   Dg Chest Port 1 View  09/12/2012   *RADIOLOGY REPORT*  Clinical Data: Port-A-Cath placement  PORTABLE CHEST - 1 VIEW  Comparison: CT chest 08/29/2012  Findings: Left subclavian Port-A-Cath tip in the mid SVC.  No pneumothorax  The lungs are clear.  IMPRESSION: Satisfactory Port-A-Cath placement.   Original Report Authenticated By: Janeece Riggers, M.D.   Dg Fluoro Guide Cv Line-no Report  09/12/2012   CLINICAL DATA: right breast mass   FLOURO GUIDE CV LINE  Fluoroscopy was utilized by the requesting physician.  No radiographic  interpretation.     ASSESSMENT: 47 year old female with  #1 T2 NX MX invasive ductal carcinoma of the breast found a self breast examination of the right breast. She is a good candidate for neoadjuvant chemotherapy. We discussed treatment with Adriamycin Cytoxan followed by Taxol. She will receive dose dense a.c. for 4 cycles followed by Taxol weekly for 12 weeks. She is scheduled to have a Port-A-Cath placed next week by Dr. Emelia Loron. She desires to begin treatment as soon as possible.  #2 we discussed her CT scan results and PET scan results as well as her MRI results today.  #3s/p neoadjuvant chemotherapy with AC on 7/18-. 10/25/2012 for a Total 4 cycles   #4 patient was begun on Taxol and adjuvantly. She received one cycle on 11/08/2012. 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  #4 I have recommended she begin Abraxane days 1, 8 and 15 on a 28 day cycle.  She will receive 4 cycles.  Risks and benefits of this were discussed with the patient.   PLAN:  #1 Patient is doing well.  Labs are stable.  She will proceed with Abraxane today.    #2 she'll return in one week for labs and evaluation   All questions were answered. The patient knows to call the clinic with any problems, questions or concerns. We can certainly see the patient much sooner if necessary.  I spent 15 minutes counseling the patient face to face. The total time spent in the appointment was 30 minutes.  Cherie Ouch Lyn Hollingshead, NP Medical Oncology [facility] Phone: [phone]

## 2012-12-06 ENCOUNTER — Other Ambulatory Visit (HOSPITAL_BASED_OUTPATIENT_CLINIC_OR_DEPARTMENT_OTHER): Payer: BC Managed Care – PPO | Admitting: Lab

## 2012-12-06 ENCOUNTER — Ambulatory Visit (HOSPITAL_BASED_OUTPATIENT_CLINIC_OR_DEPARTMENT_OTHER): Payer: BC Managed Care – PPO | Admitting: Physician Assistant

## 2012-12-06 ENCOUNTER — Ambulatory Visit: Payer: BC Managed Care – PPO

## 2012-12-06 VITALS — BP 112/77 | HR 89 | Temp 98.0°F | Resp 18 | Ht 67.0 in | Wt 193.1 lb

## 2012-12-06 DIAGNOSIS — C50419 Malignant neoplasm of upper-outer quadrant of unspecified female breast: Secondary | ICD-10-CM

## 2012-12-06 DIAGNOSIS — G5690 Unspecified mononeuropathy of unspecified upper limb: Secondary | ICD-10-CM

## 2012-12-06 DIAGNOSIS — G5790 Unspecified mononeuropathy of unspecified lower limb: Secondary | ICD-10-CM

## 2012-12-06 DIAGNOSIS — K089 Disorder of teeth and supporting structures, unspecified: Secondary | ICD-10-CM

## 2012-12-06 DIAGNOSIS — C50919 Malignant neoplasm of unspecified site of unspecified female breast: Secondary | ICD-10-CM

## 2012-12-06 DIAGNOSIS — Z170 Estrogen receptor positive status [ER+]: Secondary | ICD-10-CM

## 2012-12-06 LAB — CBC WITH DIFFERENTIAL/PLATELET
BASO%: 1.4 % (ref 0.0–2.0)
Eosinophils Absolute: 0.1 10*3/uL (ref 0.0–0.5)
HCT: 33.4 % — ABNORMAL LOW (ref 34.8–46.6)
LYMPH%: 17.8 % (ref 14.0–49.7)
MCHC: 33.5 g/dL (ref 31.5–36.0)
MCV: 93.7 fL (ref 79.5–101.0)
MONO#: 0.4 10*3/uL (ref 0.1–0.9)
MONO%: 10 % (ref 0.0–14.0)
NEUT%: 66.9 % (ref 38.4–76.8)
Platelets: 267 10*3/uL (ref 145–400)
RBC: 3.57 10*6/uL — ABNORMAL LOW (ref 3.70–5.45)
WBC: 3.9 10*3/uL (ref 3.9–10.3)

## 2012-12-06 LAB — COMPREHENSIVE METABOLIC PANEL (CC13)
Alkaline Phosphatase: 51 U/L (ref 40–150)
Anion Gap: 8 mEq/L (ref 3–11)
CO2: 29 mEq/L (ref 22–29)
Creatinine: 0.6 mg/dL (ref 0.6–1.1)
Glucose: 91 mg/dl (ref 70–140)
Sodium: 142 mEq/L (ref 136–145)
Total Bilirubin: 0.44 mg/dL (ref 0.20–1.20)

## 2012-12-10 NOTE — Progress Notes (Signed)
OFFICE PROGRESS NOTE  CC  [name],[name], MD [address] [place] [address] Dr. Reuel Boom Pomposini  Dr. Emelia Loron  Dr. Lurline Hare  DIAGNOSIS: 47 year old female with new diagnosis of stage II right breast cancer  STAGE:  Right breast  Stage II,T2NxMx  ER+PR-Her2Neu-  Ki-67 high   PRIOR THERAPY:  #1June 2013 had a mammogram that was normal. But there was on physical exam possibility of a cyst noted in the right breast. The mammogram was negative. In 2014 June patient noted on exam another lump in the right breast. She underwent a diagnostic mammogram on June 10 that showed a right breast nodule in the outer quadrant. She had an ultrasound performed that showed at the 9:30 o'clock position a 3.6 cm area and then added 10:00 position 1.3 cm area with a total area being anywhere between 5-6 cm. The patient went on to have a right breast biopsy performed in [place]. The pathology revealed an invasive ductal carcinoma. This is been confirmed by our pathology as well. The carcinoma and papillary features and was felt to be between a grade 1 and 2. The tumor was estrogen receptor positive strongly (100%) progesterone receptor negative HER-2/neu negative with a Ki-67 that showed a high proliferation rate.  #2 patient was seen by me on 08/22/2012 to discuss treatment options. Due to the size of the tumor I had recommended that patient proceed with neoadjuvant treatment initially consisting of chemotherapy. However due to the workup being incomplete I did recommend that she undergo MRI of the breasts as well as staging studies. When she has had. MRI of the breasts performed on 08/27/2012 revealed in the right upper quadrant irregular lobulated mass with a satellite nodule in ambulation within 2 mm at its superior aspect measured together as 4.5 x 4.0 x 3.8 cm. No lymphadenopathy was noted there was no any other area of abnormal enhancement in either breast. Patient also had PET scans  performed on 73 that revealed the primary breast cancer measuring 3.2 x 3.3 cm with SUV of 16. It was adjacent nodule along his pure medial border of the primary mass measuring 1.4 cm which was also hypermetabolic. There were no additional areas of abnormal hypermetabolism In the chest. Abdomen and pelvis showed no abnormal hypermetabolic activity within the liver pancreas adrenal glands or spleen. No hypermetabolic lymph nodes. In the skeleton no focal hypermetabolic activity to suggest skeletal metastasis.  #3 patient was also seen by Dr. Emelia Loron as well as Dr. Lurline Hare in consultation. Dr. Dwain Sarna has agreed to place a port for eventual chemotherapy. Patient also has had chemotherapy teaching class. The port will be placed on 09/12/2012.  #4. Status post Neoadjuvant AC q 2 weeks on 7/18 she completed this on 10/25/12.    #5 received one dose of Taxol on 11/08/2012 developed significant grade 2 neuropathy.  # 6 patient will begin single agent Abraxane day 1, 8, 15 on a 28 day cycle.   CURRENT THERAPY:  Status post 1 cycle Abraxane   INTERVAL HISTORY: Elizabeth Miles 47 y.o. female returns for evaluation for follow up. She is status post one cycle of Abraxane. She is accompanied today by her parents and son. She complains of being tired and has an aching and tingling in her legs but has noticed decreased swelling as compared when she is being treated with Taxol. She reports her appetite is good but denies any nausea vomiting or diarrhea. She hasn't some issues with constipation. She states  occasionally her chest feels heavy but she denies any Frank chest pain or shortness of breath. She has a tooth that is chipped and she's noticed that her teeth seem to be getting darker. She is concerned that her teeth may be weakening/pitting peripheral and is wondering if it's related to her chemotherapy. She continues to complain of issues with her hemorrhoids and found Proctofoam difficulty use.  She reports that her Maurine Minister name is Brennan Bailey DDS phone #[phone]. She is otherwise doing well today.  She denies fevers, chills, nausea, vomiting, constipation, diarrhea.  Her numbness is improved.  It continues to occur mostly in the morning and then resolves. She states that she and her family plan to the beach tomorrow. Otherwise, a 10 point ROS is neg.   MEDICAL HISTORY: Past Medical History  Diagnosis Date  . 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 GERD (gastroesophageal reflux disease)   . GERD (gastroesophageal reflux disease) 08/22/2012  . Allergy   . Complication of anesthesia     1986 ; problem waking up  . Anxiety     ALLERGIES:  is allergic to darvocet; shellfish allergy; and other.  MEDICATIONS:  Current Outpatient Prescriptions  Medication Sig Dispense Refill  . acetaminophen (TYLENOL) 325 MG tablet Take 650 mg by mouth every 4 (four) hours as needed for pain.      Marland Kitchen Dexlansoprazole (DEXILANT) 30 MG capsule Take 1 capsule (30 mg total) by mouth daily.  30 capsule  7  . hyaluronate sodium (RADIAPLEXRX) GEL Apply 1 application topically once. Apply after rad txs and bedtime,prn      . ibuprofen (ADVIL,MOTRIN) 200 MG tablet Take 200 mg by mouth every 4 (four) hours as needed for pain.      Marland Kitchen lidocaine-prilocaine (EMLA) cream Apply topically as needed.  30 g  7  . loratadine (CLARITIN) 10 MG tablet Take 10 mg by mouth as needed for allergies.      Marland Kitchen ondansetron (ZOFRAN) 8 MG tablet       . PROCTOFOAM HC rectal foam       . triamcinolone cream (KENALOG) 0.1 % Apply topically 2 (two) times daily.  30 g  0   No current facility-administered medications for this visit.    SURGICAL HISTORY:  Past Surgical History  Procedure Laterality Date  . Dilation and curettage of uterus  1993  . Cervical ablation  1983  . Breast biopsy Right 08/06/12  . Popliteal synovial cyst excision  1970  . Portacath placement Left 09/12/2012    Procedure: INSERTION PORT-A-CATH;   Surgeon: Emelia Loron, MD;  [facility];  Service: General;  Laterality: Left;    REVIEW OF SYSTEMS:  A 10 point review of systems was conducted and is otherwise negative except for what is noted above.    PHYSICAL EXAMINATION: Blood pressure 112/77, pulse 89, temperature 98 F (36.7 C), temperature source Oral, resp. rate 18, height 5\' 7"  (1.702 m), weight 193 lb 1.6 oz (87.59 kg), SpO2 100.00%. Body mass index is 30.24 kg/(m^2). General: Patient is a well appearing female in no acute distress HEENT: PERRLA, sclerae anicteric no conjunctival pallor, no evidence of thrush or mucositis. She does have a chipped tooth the upper right front of her mouth. Neck: supple, no palpable adenopathy Lungs: clear to auscultation bilaterally, no wheezes, rhonchi, or rales Cardiovascular: regular rate rhythm, S1, S2, no murmurs, rubs or gallops Abdomen: Soft, non-tender, non-distended, normoactive bowel sounds, no HSM Extremities: warm and well perfused, no  clubbing, cyanosis, or edema Skin: No rashes or lesions Neuro: Non-focal Breasts: exam deferred  ECOG PERFORMANCE STATUS: 1 - Symptomatic but completely ambulatory   LABORATORY DATA: Lab Results  Component Value Date   WBC 3.9 12/06/2012   HGB 11.2* 12/06/2012   HCT 33.4* 12/06/2012   MCV 93.7 12/06/2012   PLT 267 12/06/2012      Chemistry      Component Value Date/Time   NA 142 12/06/2012 1245   K 4.3 12/06/2012 1245   CO2 29 12/06/2012 1245   BUN 13.9 12/06/2012 1245   CREATININE 0.6 12/06/2012 1245      Component Value Date/Time   CALCIUM 9.8 12/06/2012 1245   ALKPHOS 51 12/06/2012 1245   AST 17 12/06/2012 1245   ALT 24 12/06/2012 1245   BILITOT 0.44 12/06/2012 1245       RADIOGRAPHIC STUDIES:  Ct Chest W Contrast  08/29/2012   *RADIOLOGY REPORT*  Clinical Data:  Breast cancer.  CT CHEST, ABDOMEN AND PELVIS WITH CONTRAST  Technique:  Multidetector CT imaging of the chest, abdomen and pelvis was performed following  the standard protocol during bolus administration of intravenous contrast.  Contrast: OMNIPAQUE IOHEXOL 300 MG/ML  SOLN  Comparison:  PET 08/29/2012.  CT CHEST  Findings:  No pathologically enlarged mediastinal, hilar, axillary or internal mammary lymph nodes.  Primary right breast mass measures 3.1 x 3.4 cm.  There is a smaller nodule along the superior medial margin of the primary mass, measuring 1.2 x 1.3 cm. Heart size normal.  No pericardial effusion.  Minimal biapical pleural parenchymal scarring.  An oblong 4 mm (2 x 6 mm) nodule along the left major fissure is most consistent with a subpleural lymph node.  Lungs are otherwise clear.  No pleural fluid.  Airway is unremarkable.  IMPRESSION: Right breast lesions, consistent with the given history of breast cancer, without evidence of metastatic disease.  CT ABDOMEN AND PELVIS  Findings:  Liver, gallbladder, adrenal glands, kidneys, spleen, pancreas, stomach and small bowel are unremarkable.  Appendix is normal.  A fair amount of stool is seen in the colon, indicative of constipation.  No pathologically enlarged lymph nodes.  Uterus and ovaries are visualized.  No free fluid.  No worrisome lytic or sclerotic lesions.  IMPRESSION: No evidence of metastatic disease in the abdomen or pelvis.   Original Report Authenticated By: Leanna Battles, M.D.   Ct Abdomen Pelvis W Contrast  08/29/2012   *RADIOLOGY REPORT*  Clinical Data:  Breast cancer.  CT CHEST, ABDOMEN AND PELVIS WITH CONTRAST  Technique:  Multidetector CT imaging of the chest, abdomen and pelvis was performed following the standard protocol during bolus administration of intravenous contrast.  Contrast: OMNIPAQUE IOHEXOL 300 MG/ML  SOLN  Comparison:  PET 08/29/2012.  CT CHEST  Findings:  No pathologically enlarged mediastinal, hilar, axillary or internal mammary lymph nodes.  Primary right breast mass measures 3.1 x 3.4 cm.  There is a smaller nodule along the superior medial margin of the  primary mass, measuring 1.2 x 1.3 cm. Heart size normal.  No pericardial effusion.  Minimal biapical pleural parenchymal scarring.  An oblong 4 mm (2 x 6 mm) nodule along the left major fissure is most consistent with a subpleural lymph node.  Lungs are otherwise clear.  No pleural fluid.  Airway is unremarkable.  IMPRESSION: Right breast lesions, consistent with the given history of breast cancer, without evidence of metastatic disease.  CT ABDOMEN AND PELVIS  Findings:  Liver, gallbladder,  adrenal glands, kidneys, spleen, pancreas, stomach and small bowel are unremarkable.  Appendix is normal.  A fair amount of stool is seen in the colon, indicative of constipation.  No pathologically enlarged lymph nodes.  Uterus and ovaries are visualized.  No free fluid.  No worrisome lytic or sclerotic lesions.  IMPRESSION: No evidence of metastatic disease in the abdomen or pelvis.   Original Report Authenticated By: Leanna Battles, M.D.   Mr Breast Bilateral W Wo Contrast  08/27/2012   *RADIOLOGY REPORT*  Clinical Data: Newly-diagnosed right breast invasive ductal carcinoma manifesting as a palpable mass.  BILATERAL BREAST MRI WITH AND WITHOUT CONTRAST  Technique: Multiplanar, multisequence MR images of both breasts were obtained prior to and following the intravenous administration of 16ml of Multihance.  Three dimensional images were evaluated at the independent DynaCad workstation.  Comparison:  [place] mammograms 2014 and 2012.  We are not provided with post biopsy mammograms to indicate whether a clip was placed.  Findings: Background parenchymal enhancement pattern is mild. Breast parenchymal pattern suggests heterogeneously dense parenchyma.  In the right breast upper outer quadrant is an irregular lobulated mass with central T2 hyperintensity suggesting necrosis or biopsy artifact, with a satellite nodule or lobulation within 2 mm at its superior aspect, measured together as 4.5 x 4.0 x 3.8 cm.  This demonstrates  washin/washout type enhancement kinetics.  This corresponds to the reported biopsy-proven breast cancer.  Internal signal voids are noted which may indicate gas, clip artifact, or less likely calcification.  There is extension of enhancement to the nipple, suggesting nipple involvement may be present.  No other area of abnormal enhancement is seen in either breast.  No lymphadenopathy or T2-weighted hyperintensity elsewhere in either breast.  IMPRESSION: Dominant right breast upper outer quadrant irregular mass corresponding to the biopsy-proven breast cancer, measuring 4.5 cm in total and including the satellite nodule or lobulation at its superior aspect, which is within 2 mm of the dominant mass.  It is not clear from the provided images whether a clip was placed at prior biopsy.  If the patient is to undergo neoadjuvant chemotherapy, placement of a clip is highly recommended, because the mass could become occult on imaging if there is a favorable treatment response, and therefore may not be accurately localized if lumpectomy is planned.  Extension of enhancement from the mass to the right nipple may suggest nipple involvement.  No evidence for contralateral left-sided abnormal enhancement.  RECOMMENDATION: Treatment plan  THREE-DIMENSIONAL MR IMAGE RENDERING ON INDEPENDENT WORKSTATION:  Three-dimensional MR images were rendered by post-processing of the original MR data on an independent workstation.  The three- dimensional MR images were interpreted, and findings were reported in the accompanying complete MRI report for this study.  BI-RADS CATEGORY 6:  Known biopsy-proven malignancy - appropriate action should be taken.   Original Report Authenticated By: Christiana Pellant, M.D.   Nm Pet Image Initial (pi) Skull Base To Thigh  08/29/2012   *RADIOLOGY REPORT*  Clinical Data: Initial treatment strategy for breast cancer.  NUCLEAR MEDICINE PET SKULL BASE TO THIGH  Fasting Blood Glucose:  88  Technique:  17.6 mCi  F-18 FDG was injected intravenously. CT data was obtained and used for attenuation correction and anatomic localization only.  (This was not acquired as a diagnostic CT examination.) Additional exam technical data entered on technologist worksheet.  Comparison:  CT chest abdomen pelvis 08/29/2012.  Findings:  Neck: No hypermetabolic lymph nodes in the neck.  Hypermetabolic brown fat is noted.  CT  images show no acute findings.  Chest:  No hypermetabolic mediastinal or hilar lymph nodes.  No suspicious pulmonary nodules.  There is hypermetabolic brown fat.  Primary right breast mass measures 3.2 x 3.3 cm with an S U V max of 16.0.  There is an adjacent nodule along the superomedial border of the primary mass, measuring 1.4 cm, also hypermetabolic.  No additional areas of abnormal hypermetabolism in the chest.  CT images show no acute findings.  Specifically, no pericardial or pleural effusion.  Abdomen/Pelvis:  No abnormal hypermetabolic activity within the liver, pancreas, adrenal glands or spleen.  No hypermetabolic lymph nodes.  CT images show no acute findings.  Skeleton:  No focal hypermetabolic activity to suggest skeletal metastasis.  IMPRESSION: Hypermetabolic right breast lesions, consistent with the given history breast cancer, without evidence of metastatic disease.   Original Report Authenticated By: Leanna Battles, M.D.   Dg Chest Port 1 View  09/12/2012   *RADIOLOGY REPORT*  Clinical Data: Port-A-Cath placement  PORTABLE CHEST - 1 VIEW  Comparison: CT chest 08/29/2012  Findings: Left subclavian Port-A-Cath tip in the mid SVC.  No pneumothorax  The lungs are clear.  IMPRESSION: Satisfactory Port-A-Cath placement.   Original Report Authenticated By: Janeece Riggers, M.D.   Dg Fluoro Guide Cv Line-no Report  09/12/2012   CLINICAL DATA: right breast mass   FLOURO GUIDE CV LINE  Fluoroscopy was utilized by the requesting physician.  No radiographic  interpretation.     ASSESSMENT: 47 year old female  with  #1 T2 NX MX invasive ductal carcinoma of the breast found a self breast examination of the right breast. She is a good candidate for neoadjuvant chemotherapy. We discussed treatment with Adriamycin Cytoxan followed by Taxol. She will receive dose dense a.c. for 4 cycles followed by Taxol weekly for 12 weeks.   #2 s/p neoadjuvant chemotherapy with AC on 7/18-. 10/25/2012 for a Total 4 cycles   #3 patient was begun on Taxol and adjuvantly. She received one cycle on 11/08/2012. 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   #4 status post 1 cycle of Abraxane she overall tolerated well. Plan is for her to have a total 4 cycles  #5 chipped tooth.   PLAN:  #1 Patient is doing well.  Labs are stable.  She will proceed with Abraxane today.    #2 she'll return in one week for labs and evaluation and to proceed with chemotherapy  #3 constipation patient advised to take MiraLAX for a similar product   #4 Chipped tooth- I made a call to Dr. Uvaldo Bristle office requesting that they evaluate Ms. Penafiel some let us know would be involved in repairing her tooth so that we can see if it is something that needs to be done urgently your can wait until she completes her fourth cycle of chemotherapy.  All questions were answered. The patient knows to call the clinic with any problems, questions or concerns. We can certainly see the patient much sooner if necessary.  I spent 25 minutes counseling the patient face to face. The total time spent in the appointment was 35 minutes.  Marlana Salvage  Medical Oncology [facility] Phone: [phone]

## 2012-12-10 NOTE — Patient Instructions (Signed)
Follow up in 1 week, prior to your next cycle of chemotherapy See your dentist for evaluation regarding your chipped tooth

## 2012-12-13 ENCOUNTER — Other Ambulatory Visit: Payer: Self-pay | Admitting: Adult Health

## 2012-12-13 ENCOUNTER — Ambulatory Visit (HOSPITAL_BASED_OUTPATIENT_CLINIC_OR_DEPARTMENT_OTHER): Payer: BC Managed Care – PPO

## 2012-12-13 ENCOUNTER — Encounter: Payer: Self-pay | Admitting: Adult Health

## 2012-12-13 ENCOUNTER — Ambulatory Visit (HOSPITAL_BASED_OUTPATIENT_CLINIC_OR_DEPARTMENT_OTHER): Payer: BC Managed Care – PPO | Admitting: Adult Health

## 2012-12-13 ENCOUNTER — Other Ambulatory Visit (HOSPITAL_BASED_OUTPATIENT_CLINIC_OR_DEPARTMENT_OTHER): Payer: BC Managed Care – PPO

## 2012-12-13 VITALS — BP 111/80 | HR 86 | Temp 98.3°F | Resp 18 | Ht 67.0 in | Wt 195.0 lb

## 2012-12-13 DIAGNOSIS — R3911 Hesitancy of micturition: Secondary | ICD-10-CM

## 2012-12-13 DIAGNOSIS — C50419 Malignant neoplasm of upper-outer quadrant of unspecified female breast: Secondary | ICD-10-CM

## 2012-12-13 DIAGNOSIS — N390 Urinary tract infection, site not specified: Secondary | ICD-10-CM

## 2012-12-13 DIAGNOSIS — Z5111 Encounter for antineoplastic chemotherapy: Secondary | ICD-10-CM

## 2012-12-13 DIAGNOSIS — C50911 Malignant neoplasm of unspecified site of right female breast: Secondary | ICD-10-CM

## 2012-12-13 DIAGNOSIS — C50919 Malignant neoplasm of unspecified site of unspecified female breast: Secondary | ICD-10-CM

## 2012-12-13 DIAGNOSIS — Z170 Estrogen receptor positive status [ER+]: Secondary | ICD-10-CM

## 2012-12-13 LAB — URINALYSIS, MICROSCOPIC - CHCC
Glucose: NEGATIVE mg/dL
Nitrite: NEGATIVE
Protein: NEGATIVE mg/dL
Specific Gravity, Urine: 1.01 (ref 1.003–1.035)
Urobilinogen, UR: 0.2 mg/dL (ref 0.2–1)
pH: 7 (ref 4.6–8.0)

## 2012-12-13 LAB — CBC WITH DIFFERENTIAL/PLATELET
Basophils Absolute: 0.1 10*3/uL (ref 0.0–0.1)
Eosinophils Absolute: 0.1 10*3/uL (ref 0.0–0.5)
HCT: 36.2 % (ref 34.8–46.6)
HGB: 12.1 g/dL (ref 11.6–15.9)
LYMPH%: 16.2 % (ref 14.0–49.7)
MONO#: 0.6 10*3/uL (ref 0.1–0.9)
NEUT%: 67 % (ref 38.4–76.8)
Platelets: 288 10*3/uL (ref 145–400)
RBC: 3.9 10*6/uL (ref 3.70–5.45)
WBC: 4.3 10*3/uL (ref 3.9–10.3)
lymph#: 0.7 10*3/uL — ABNORMAL LOW (ref 0.9–3.3)

## 2012-12-13 LAB — COMPREHENSIVE METABOLIC PANEL (CC13)
ALT: 28 U/L (ref 0–55)
AST: 20 U/L (ref 5–34)
Albumin: 3.9 g/dL (ref 3.5–5.0)
Alkaline Phosphatase: 61 U/L (ref 40–150)
Calcium: 10.1 mg/dL (ref 8.4–10.4)
Glucose: 97 mg/dl (ref 70–140)
Potassium: 4.6 mEq/L (ref 3.5–5.1)
Sodium: 142 mEq/L (ref 136–145)
Total Protein: 7.2 g/dL (ref 6.4–8.3)

## 2012-12-13 MED ORDER — ONDANSETRON 8 MG/NS 50 ML IVPB
INTRAVENOUS | Status: AC
  Filled 2012-12-13: qty 8

## 2012-12-13 MED ORDER — PACLITAXEL PROTEIN-BOUND CHEMO INJECTION 100 MG
100.0000 mg/m2 | Freq: Once | INTRAVENOUS | Status: AC
  Administered 2012-12-13: 200 mg via INTRAVENOUS
  Filled 2012-12-13: qty 40

## 2012-12-13 MED ORDER — CIPROFLOXACIN HCL 500 MG PO TABS: 500.0000 mg | ORAL_TABLET | Freq: Two times a day (BID) | ORAL | Status: DC

## 2012-12-13 MED ORDER — SODIUM CHLORIDE 0.9 % IJ SOLN
10.0000 mL | INTRAMUSCULAR | Status: DC | PRN
  Administered 2012-12-13: 10 mL
  Filled 2012-12-13: qty 10

## 2012-12-13 MED ORDER — ONDANSETRON 8 MG/50ML IVPB (CHCC)
8.0000 mg | Freq: Once | INTRAVENOUS | Status: AC
  Administered 2012-12-13: 8 mg via INTRAVENOUS

## 2012-12-13 MED ORDER — DEXAMETHASONE SODIUM PHOSPHATE 10 MG/ML IJ SOLN
INTRAMUSCULAR | Status: AC
  Filled 2012-12-13: qty 1

## 2012-12-13 MED ORDER — DEXAMETHASONE SODIUM PHOSPHATE 10 MG/ML IJ SOLN
10.0000 mg | Freq: Once | INTRAMUSCULAR | Status: AC
  Administered 2012-12-13: 10 mg via INTRAVENOUS

## 2012-12-13 MED ORDER — SODIUM CHLORIDE 0.9 % IV SOLN
Freq: Once | INTRAVENOUS | Status: AC
  Administered 2012-12-13: 15:00:00 via INTRAVENOUS

## 2012-12-13 MED ORDER — HEPARIN SOD (PORK) LOCK FLUSH 100 UNIT/ML IV SOLN
500.0000 [IU] | Freq: Once | INTRAVENOUS | Status: AC | PRN
  Administered 2012-12-13: 500 [IU]
  Filled 2012-12-13: qty 5

## 2012-12-13 NOTE — Patient Instructions (Signed)
[facility] Discharge Instructions for Patients Receiving Chemotherapy  Today you received the following chemotherapy agents Abraxane  To help prevent nausea and vomiting after your treatment, we encourage you to take your nausea medication as prescribed.   If you develop nausea and vomiting that is not controlled by your nausea medication, call the clinic.   BELOW ARE SYMPTOMS THAT SHOULD BE REPORTED IMMEDIATELY:  *FEVER GREATER THAN 100.5 F  *CHILLS WITH OR WITHOUT FEVER  NAUSEA AND VOMITING THAT IS NOT CONTROLLED WITH YOUR NAUSEA MEDICATION  *UNUSUAL SHORTNESS OF BREATH  *UNUSUAL BRUISING OR BLEEDING  TENDERNESS IN MOUTH AND THROAT WITH OR WITHOUT PRESENCE OF ULCERS  *URINARY PROBLEMS  *BOWEL PROBLEMS  UNUSUAL RASH Items with * indicate a potential emergency and should be followed up as soon as possible.  Feel free to call the clinic you have any questions or concerns. The clinic phone number is [phone].    

## 2012-12-13 NOTE — Progress Notes (Signed)
OFFICE PROGRESS NOTE  CC  [name],[name], MD [address] [place] [address] Dr. Reuel Boom Pomposini  Dr. Emelia Loron  Dr. Lurline Hare  DIAGNOSIS: 47 year old female with new diagnosis of stage II right breast cancer  STAGE:  Right breast  Stage II,T2NxMx  ER+PR-Her2Neu-  Ki-67 high   PRIOR THERAPY:  #1June 2013 had a mammogram that was normal. But there was on physical exam possibility of a cyst noted in the right breast. The mammogram was negative. In 2014 June patient noted on exam another lump in the right breast. She underwent a diagnostic mammogram on June 10 that showed a right breast nodule in the outer quadrant. She had an ultrasound performed that showed at the 9:30 o'clock position a 3.6 cm area and then added 10:00 position 1.3 cm area with a total area being anywhere between 5-6 cm. The patient went on to have a right breast biopsy performed in [place]. The pathology revealed an invasive ductal carcinoma. This is been confirmed by our pathology as well. The carcinoma and papillary features and was felt to be between a grade 1 and 2. The tumor was estrogen receptor positive strongly (100%) progesterone receptor negative HER-2/neu negative with a Ki-67 that showed a high proliferation rate.  #2 patient was seen by me on 08/22/2012 to discuss treatment options. Due to the size of the tumor I had recommended that patient proceed with neoadjuvant treatment initially consisting of chemotherapy. However due to the workup being incomplete I did recommend that she undergo MRI of the breasts as well as staging studies. When she has had. MRI of the breasts performed on 08/27/2012 revealed in the right upper quadrant irregular lobulated mass with a satellite nodule in ambulation within 2 mm at its superior aspect measured together as 4.5 x 4.0 x 3.8 cm. No lymphadenopathy was noted there was no any other area of abnormal enhancement in either breast. Patient also had PET scans  performed on 73 that revealed the primary breast cancer measuring 3.2 x 3.3 cm with SUV of 16. It was adjacent nodule along his pure medial border of the primary mass measuring 1.4 cm which was also hypermetabolic. There were no additional areas of abnormal hypermetabolism In the chest. Abdomen and pelvis showed no abnormal hypermetabolic activity within the liver pancreas adrenal glands or spleen. No hypermetabolic lymph nodes. In the skeleton no focal hypermetabolic activity to suggest skeletal metastasis.  #3 patient was also seen by Dr. Emelia Loron as well as Dr. Lurline Hare in consultation. Dr. Dwain Sarna has agreed to place a port for eventual chemotherapy. Patient also has had chemotherapy teaching class. The port will be placed on 09/12/2012.  #4. Status post Neoadjuvant AC q 2 weeks on 7/18 she completed this on 10/25/12.    #5 received one dose of Taxol on 11/08/2012 developed significant grade 2 neuropathy.  # 6 patient will begin single agent Abraxane day 1, 8, 15 on a 28 day cycle.   CURRENT THERAPY:  Cycle 2 day 1 of Abraxane  INTERVAL HISTORY: DANAJAH BIRDSELL 47 y.o. female returns for evaluation.  She has occasional aching that she takes tylenol for and it improves.  She denies fevers, chills, nausea, vomiting, constipation, diarrhea.  She has the sensation of urinary hesitancy, and incomplete bladder emptying.  She denies dysuria, oliguria.  Her numbness is improved.  Otherwise, she is well and without concerns.    MEDICAL HISTORY: Past Medical History  Diagnosis Date  . Breast cancer 08/06/12  invasive ductal carcioma  . GERD (gastroesophageal reflux disease)   . GERD (gastroesophageal reflux disease) 08/22/2012  . Allergy   . Complication of anesthesia     1986 ; problem waking up  . Anxiety     ALLERGIES:  is allergic to darvocet; shellfish allergy; and other.  MEDICATIONS:  Current Outpatient Prescriptions  Medication Sig Dispense Refill  . acetaminophen  (TYLENOL) 325 MG tablet Take 650 mg by mouth every 4 (four) hours as needed for pain.      Marland Kitchen Dexlansoprazole (DEXILANT) 30 MG capsule Take 1 capsule (30 mg total) by mouth daily.  30 capsule  7  . hyaluronate sodium (RADIAPLEXRX) GEL Apply 1 application topically once. Apply after rad txs and bedtime,prn      . ibuprofen (ADVIL,MOTRIN) 200 MG tablet Take 200 mg by mouth every 4 (four) hours as needed for pain.      Marland Kitchen lidocaine-prilocaine (EMLA) cream Apply topically as needed.  30 g  7  . loratadine (CLARITIN) 10 MG tablet Take 10 mg by mouth as needed for allergies.      Marland Kitchen ondansetron (ZOFRAN) 8 MG tablet       . PROCTOFOAM HC rectal foam       . triamcinolone cream (KENALOG) 0.1 % Apply topically 2 (two) times daily.  30 g  0  . ciprofloxacin (CIPRO) 500 MG tablet Take 1 tablet (500 mg total) by mouth 2 (two) times daily.  14 tablet  0   No current facility-administered medications for this visit.    SURGICAL HISTORY:  Past Surgical History  Procedure Laterality Date  . Dilation and curettage of uterus  1993  . Cervical ablation  1983  . Breast biopsy Right 08/06/12  . Popliteal synovial cyst excision  1970  . Portacath placement Left 09/12/2012    Procedure: INSERTION PORT-A-CATH;  Surgeon: Emelia Loron, MD;  Location: [facility] OR;  Service: General;  Laterality: Left;    REVIEW OF SYSTEMS:  A 10 point review of systems was conducted and is otherwise negative except for what is noted above.    PHYSICAL EXAMINATION: Blood pressure 111/80, pulse 86, temperature 98.3 F (36.8 C), temperature source Oral, resp. rate 18, height 5\' 7"  (1.702 m), weight 195 lb (88.451 kg), SpO2 98.00%. Body mass index is 30.53 kg/(m^2). General: Patient is a well appearing female in no acute distress HEENT: PERRLA, sclerae anicteric no conjunctival pallor, no evidence of thrush or mucositis. She does have a chipped tooth the upper right front of her mouth. Neck: supple, no palpable adenopathy Lungs: clear  to auscultation bilaterally, no wheezes, rhonchi, or rales Cardiovascular: regular rate rhythm, S1, S2, no murmurs, rubs or gallops Abdomen: Soft, non-tender, non-distended, normoactive bowel sounds, no HSM Extremities: warm and well perfused, no clubbing, cyanosis, or edema Skin: No rashes or lesions Neuro: Non-focal Breasts: exam deferred  ECOG PERFORMANCE STATUS: 1 - Symptomatic but completely ambulatory   LABORATORY DATA: Lab Results  Component Value Date   WBC 4.3 12/13/2012   HGB 12.1 12/13/2012   HCT 36.2 12/13/2012   MCV 92.8 12/13/2012   PLT 288 12/13/2012      Chemistry      Component Value Date/Time   NA 142 12/13/2012 1242   K 4.6 12/13/2012 1242   CO2 29 12/13/2012 1242   BUN 8.9 12/13/2012 1242   CREATININE 0.7 12/13/2012 1242      Component Value Date/Time   CALCIUM 10.1 12/13/2012 1242   ALKPHOS 61 12/13/2012 1242   AST  20 12/13/2012 1242   ALT 28 12/13/2012 1242   BILITOT 0.50 12/13/2012 1242       RADIOGRAPHIC STUDIES:  Ct Chest W Contrast  08/29/2012   *RADIOLOGY REPORT*  Clinical Data:  Breast cancer.  CT CHEST, ABDOMEN AND PELVIS WITH CONTRAST  Technique:  Multidetector CT imaging of the chest, abdomen and pelvis was performed following the standard protocol during bolus administration of intravenous contrast.  Contrast: OMNIPAQUE IOHEXOL 300 MG/ML  SOLN  Comparison:  PET 08/29/2012.  CT CHEST  Findings:  No pathologically enlarged mediastinal, hilar, axillary or internal mammary lymph nodes.  Primary right breast mass measures 3.1 x 3.4 cm.  There is a smaller nodule along the superior medial margin of the primary mass, measuring 1.2 x 1.3 cm. Heart size normal.  No pericardial effusion.  Minimal biapical pleural parenchymal scarring.  An oblong 4 mm (2 x 6 mm) nodule along the left major fissure is most consistent with a subpleural lymph node.  Lungs are otherwise clear.  No pleural fluid.  Airway is unremarkable.  IMPRESSION: Right breast  lesions, consistent with the given history of breast cancer, without evidence of metastatic disease.  CT ABDOMEN AND PELVIS  Findings:  Liver, gallbladder, adrenal glands, kidneys, spleen, pancreas, stomach and small bowel are unremarkable.  Appendix is normal.  A fair amount of stool is seen in the colon, indicative of constipation.  No pathologically enlarged lymph nodes.  Uterus and ovaries are visualized.  No free fluid.  No worrisome lytic or sclerotic lesions.  IMPRESSION: No evidence of metastatic disease in the abdomen or pelvis.   Original Report Authenticated By: Leanna Battles, M.D.   Ct Abdomen Pelvis W Contrast  08/29/2012   *RADIOLOGY REPORT*  Clinical Data:  Breast cancer.  CT CHEST, ABDOMEN AND PELVIS WITH CONTRAST  Technique:  Multidetector CT imaging of the chest, abdomen and pelvis was performed following the standard protocol during bolus administration of intravenous contrast.  Contrast: OMNIPAQUE IOHEXOL 300 MG/ML  SOLN  Comparison:  PET 08/29/2012.  CT CHEST  Findings:  No pathologically enlarged mediastinal, hilar, axillary or internal mammary lymph nodes.  Primary right breast mass measures 3.1 x 3.4 cm.  There is a smaller nodule along the superior medial margin of the primary mass, measuring 1.2 x 1.3 cm. Heart size normal.  No pericardial effusion.  Minimal biapical pleural parenchymal scarring.  An oblong 4 mm (2 x 6 mm) nodule along the left major fissure is most consistent with a subpleural lymph node.  Lungs are otherwise clear.  No pleural fluid.  Airway is unremarkable.  IMPRESSION: Right breast lesions, consistent with the given history of breast cancer, without evidence of metastatic disease.  CT ABDOMEN AND PELVIS  Findings:  Liver, gallbladder, adrenal glands, kidneys, spleen, pancreas, stomach and small bowel are unremarkable.  Appendix is normal.  A fair amount of stool is seen in the colon, indicative of constipation.  No pathologically enlarged lymph nodes.  Uterus  and ovaries are visualized.  No free fluid.  No worrisome lytic or sclerotic lesions.  IMPRESSION: No evidence of metastatic disease in the abdomen or pelvis.   Original Report Authenticated By: Leanna Battles, M.D.   Mr Breast Bilateral W Wo Contrast  08/27/2012   *RADIOLOGY REPORT*  Clinical Data: Newly-diagnosed right breast invasive ductal carcinoma manifesting as a palpable mass.  BILATERAL BREAST MRI WITH AND WITHOUT CONTRAST  Technique: Multiplanar, multisequence MR images of both breasts were obtained prior to and following the intravenous  administration of 16ml of Multihance.  Three dimensional images were evaluated at the independent DynaCad workstation.  Comparison:  [place] mammograms 2014 and 2012.  We are not provided with post biopsy mammograms to indicate whether a clip was placed.  Findings: Background parenchymal enhancement pattern is mild. Breast parenchymal pattern suggests heterogeneously dense parenchyma.  In the right breast upper outer quadrant is an irregular lobulated mass with central T2 hyperintensity suggesting necrosis or biopsy artifact, with a satellite nodule or lobulation within 2 mm at its superior aspect, measured together as 4.5 x 4.0 x 3.8 cm.  This demonstrates washin/washout type enhancement kinetics.  This corresponds to the reported biopsy-proven breast cancer.  Internal signal voids are noted which may indicate gas, clip artifact, or less likely calcification.  There is extension of enhancement to the nipple, suggesting nipple involvement may be present.  No other area of abnormal enhancement is seen in either breast.  No lymphadenopathy or T2-weighted hyperintensity elsewhere in either breast.  IMPRESSION: Dominant right breast upper outer quadrant irregular mass corresponding to the biopsy-proven breast cancer, measuring 4.5 cm in total and including the satellite nodule or lobulation at its superior aspect, which is within 2 mm of the dominant mass.  It is not clear  from the provided images whether a clip was placed at prior biopsy.  If the patient is to undergo neoadjuvant chemotherapy, placement of a clip is highly recommended, because the mass could become occult on imaging if there is a favorable treatment response, and therefore may not be accurately localized if lumpectomy is planned.  Extension of enhancement from the mass to the right nipple may suggest nipple involvement.  No evidence for contralateral left-sided abnormal enhancement.  RECOMMENDATION: Treatment plan  THREE-DIMENSIONAL MR IMAGE RENDERING ON INDEPENDENT WORKSTATION:  Three-dimensional MR images were rendered by post-processing of the original MR data on an independent workstation.  The three- dimensional MR images were interpreted, and findings were reported in the accompanying complete MRI report for this study.  BI-RADS CATEGORY 6:  Known biopsy-proven malignancy - appropriate action should be taken.   Original Report Authenticated By: Christiana Pellant, M.D.   Nm Pet Image Initial (pi) Skull Base To Thigh  08/29/2012   *RADIOLOGY REPORT*  Clinical Data: Initial treatment strategy for breast cancer.  NUCLEAR MEDICINE PET SKULL BASE TO THIGH  Fasting Blood Glucose:  88  Technique:  17.6 mCi F-18 FDG was injected intravenously. CT data was obtained and used for attenuation correction and anatomic localization only.  (This was not acquired as a diagnostic CT examination.) Additional exam technical data entered on technologist worksheet.  Comparison:  CT chest abdomen pelvis 08/29/2012.  Findings:  Neck: No hypermetabolic lymph nodes in the neck.  Hypermetabolic brown fat is noted.  CT images show no acute findings.  Chest:  No hypermetabolic mediastinal or hilar lymph nodes.  No suspicious pulmonary nodules.  There is hypermetabolic brown fat.  Primary right breast mass measures 3.2 x 3.3 cm with an S U V max of 16.0.  There is an adjacent nodule along the superomedial border of the primary mass, measuring  1.4 cm, also hypermetabolic.  No additional areas of abnormal hypermetabolism in the chest.  CT images show no acute findings.  Specifically, no pericardial or pleural effusion.  Abdomen/Pelvis:  No abnormal hypermetabolic activity within the liver, pancreas, adrenal glands or spleen.  No hypermetabolic lymph nodes.  CT images show no acute findings.  Skeleton:  No focal hypermetabolic activity to suggest skeletal metastasis.  IMPRESSION: Hypermetabolic right breast lesions, consistent with the given history breast cancer, without evidence of metastatic disease.   Original Report Authenticated By: Leanna Battles, M.D.   Dg Chest Port 1 View  09/12/2012   *RADIOLOGY REPORT*  Clinical Data: Port-A-Cath placement  PORTABLE CHEST - 1 VIEW  Comparison: CT chest 08/29/2012  Findings: Left subclavian Port-A-Cath tip in the mid SVC.  No pneumothorax  The lungs are clear.  IMPRESSION: Satisfactory Port-A-Cath placement.   Original Report Authenticated By: Janeece Riggers, M.D.   Dg Fluoro Guide Cv Line-no Report  09/12/2012   CLINICAL DATA: right breast mass   FLOURO GUIDE CV LINE  Fluoroscopy was utilized by the requesting physician.  No radiographic  interpretation.     ASSESSMENT: 47 year old female with  #1 T2 NX MX invasive ductal carcinoma of the breast found a self breast examination of the right breast. She is a good candidate for neoadjuvant chemotherapy. We discussed treatment with Adriamycin Cytoxan followed by Taxol. She will receive dose dense a.c. for 4 cycles followed by Taxol weekly for 12 weeks.   #2 s/p neoadjuvant chemotherapy with AC on 7/18-. 10/25/2012 for a Total 4 cycles   #3 patient was begun on Taxol and adjuvantly. She received one cycle on 11/08/2012. 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   #4 Cycle 2 day 1 Abraxane neoadjuvantly. Plan is for her to have a total 4 cycles  #5 chipped tooth, she was  evaluated by her dentist, and her tooth is stable to continue chemotherapy.  She will receive a crown once chemotherapy is complete.     PLAN:  #1 Patient is doing well.  Labs are stable.  She will proceed with Abraxane today.    #2 She does have some symptoms that are concerning for a urinary tract infection.  We will get a urinalysis and culture today and prescribe Cipro if needed.   #3 She will return next week for cycle 2 day 8 of abraxane along with labs and an appointment.    All questions were answered. The patient knows to call the clinic with any problems, questions or concerns. We can certainly see the patient much sooner if necessary.  I spent 25 minutes counseling the patient face to face. The total time spent in the appointment was 35 minutes.  Illa Level, NP Medical Oncology [facility] [phone]

## 2012-12-14 LAB — URINE CULTURE

## 2012-12-16 ENCOUNTER — Other Ambulatory Visit: Payer: Self-pay | Admitting: Emergency Medicine

## 2012-12-16 ENCOUNTER — Ambulatory Visit (HOSPITAL_BASED_OUTPATIENT_CLINIC_OR_DEPARTMENT_OTHER): Payer: BC Managed Care – PPO | Admitting: Genetic Counselor

## 2012-12-16 ENCOUNTER — Encounter: Payer: Self-pay | Admitting: Genetic Counselor

## 2012-12-16 DIAGNOSIS — C50911 Malignant neoplasm of unspecified site of right female breast: Secondary | ICD-10-CM

## 2012-12-16 DIAGNOSIS — IMO0002 Reserved for concepts with insufficient information to code with codable children: Secondary | ICD-10-CM

## 2012-12-16 DIAGNOSIS — C50419 Malignant neoplasm of upper-outer quadrant of unspecified female breast: Secondary | ICD-10-CM

## 2012-12-16 MED ORDER — DEXLANSOPRAZOLE 30 MG PO CPDR: 30.0000 mg | DELAYED_RELEASE_CAPSULE | Freq: Every day | ORAL | Status: AC

## 2012-12-16 NOTE — Progress Notes (Signed)
Dr.  Drue Second requested a consultation for genetic counseling and risk assessment for Elizabeth Miles, a 47 y.o. female, for discussion of her personal history of breast cancer.  She presents to clinic today to discuss the possibility of a genetic predisposition to cancer, and to further clarify her risks, as well as her family members' risks for cancer.   HISTORY OF PRESENT ILLNESS: In June 2014, at the age of 44, Elizabeth Miles was diagnosed with invasive ductal carcinoma of the right breast. This was treated with chemotherapy, and she will have a lumpectomy and radiation afterward.  The tumor is ER+/PR+/Her2-.    Past Medical History  Diagnosis Date  . 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 GERD (gastroesophageal reflux disease)   . GERD (gastroesophageal reflux disease) 08/22/2012  . Allergy   . Complication of anesthesia     1986 ; problem waking up  . Anxiety     Past Surgical History  Procedure Laterality Date  . Dilation and curettage of uterus  1993  . Cervical ablation  1983  . Breast biopsy Right 08/06/12  . Popliteal synovial cyst excision  1970  . Portacath placement Left 09/12/2012    Procedure: INSERTION PORT-A-CATH;  Surgeon: Emelia Loron, MD;  Location: [facility] OR;  Service: General;  Laterality: Left;    History   Social History  . Marital Status: Married    Spouse Name: N/A    Number of Children: 3  . Years of Education: N/A   Social History Main Topics  . Smoking status: Never Smoker   . Smokeless tobacco: Never Used  . Alcohol Use: No  . Drug Use: No  . Sexual Activity: Yes     Comment: peri menopausal   Other Topics Concern  . None   Social History Narrative  . None    REPRODUCTIVE HISTORY AND PERSONAL RISK ASSESSMENT FACTORS: Menarche was at age 58.   premenopausal Uterus Intact: yes Ovaries Intact: yes G3P3A0, first live birth at age 12  She has not previously undergone treatment for infertility.   Oral Contraceptive  use: 3 years   She has not used HRT in the past.    FAMILY HISTORY:  We obtained a detailed, 4-generation family history.  Significant diagnoses are listed below: Family History  Problem Relation Age of Onset  . Hypertension Mother   . Bladder Cancer Maternal Uncle 68  . Cancer Paternal Grandmother 67    lung cancer  . Lung cancer Paternal Grandfather     dx in his 46s  . COPD Paternal Uncle     Patient's maternal ancestors are of [place] and [place] descent, and paternal ancestors are of unknown descent. There is no reported Ashkenazi Jewish ancestry. There is no known consanguinity.  GENETIC COUNSELING ASSESSMENT: Elizabeth Miles is a 47 y.o. female with a personal history of breast cancer which somewhat suggestive of a hereditary cancer syndrome and predisposition to cancer. We, therefore, discussed and recommended the following at today's visit.   DISCUSSION: We reviewed the characteristics, features and inheritance patterns of hereditary cancer syndromes. We also discussed genetic testing, including the appropriate family members to test, the process of testing, insurance coverage and turn-around-time for results.   PLAN: After considering the risks, benefits, and limitations, Elizabeth Miles decided to think about testing.  She feels that she wants the information for her children, but wants to think about the information we discussed. We discussed the implications of  a positive, negative and/ or variant of uncertain significance genetic test result. Results should be available within approximately 3 weeks' time, at which point they will be disclosed by telephone to Elizabeth Miles, as will any additional recommendations warranted by these results. Elizabeth Miles will receive a summary of her genetic counseling visit and a copy of her results once available. This information will also be available in Epic. We encouraged Elizabeth Miles to remain in contact with cancer genetics annually  so that we can continuously update the family history and inform her of any changes in cancer genetics and testing that may be of benefit for her family. Elizabeth Miles's questions were answered to her satisfaction today. Our contact information was provided should additional questions or concerns arise.  The patient was seen for a total of 60 minutes, greater than 50% of which was spent face-to-face counseling.  This note will also be sent to the referring provider via the electronic medical record. The patient will be supplied with a summary of this genetic counseling discussion as well as educational information on the discussed hereditary cancer syndromes following the conclusion of their visit.   Patient was discussed with Dr. Drue Second.   _______________________________________________________________________ For Office Staff:  Number of people involved in session: 4 Was an Intern/ student involved with case: yes

## 2012-12-20 ENCOUNTER — Ambulatory Visit (HOSPITAL_BASED_OUTPATIENT_CLINIC_OR_DEPARTMENT_OTHER): Payer: BC Managed Care – PPO | Admitting: Oncology

## 2012-12-20 ENCOUNTER — Other Ambulatory Visit (HOSPITAL_BASED_OUTPATIENT_CLINIC_OR_DEPARTMENT_OTHER): Payer: BC Managed Care – PPO | Admitting: Lab

## 2012-12-20 ENCOUNTER — Ambulatory Visit (HOSPITAL_BASED_OUTPATIENT_CLINIC_OR_DEPARTMENT_OTHER): Payer: BC Managed Care – PPO

## 2012-12-20 ENCOUNTER — Encounter: Payer: Self-pay | Admitting: Oncology

## 2012-12-20 ENCOUNTER — Other Ambulatory Visit: Payer: BC Managed Care – PPO

## 2012-12-20 ENCOUNTER — Ambulatory Visit: Payer: BC Managed Care – PPO | Admitting: Adult Health

## 2012-12-20 VITALS — BP 127/90 | HR 90 | Temp 98.9°F | Resp 18 | Ht 67.0 in | Wt 194.7 lb

## 2012-12-20 DIAGNOSIS — C50911 Malignant neoplasm of unspecified site of right female breast: Secondary | ICD-10-CM

## 2012-12-20 DIAGNOSIS — C50919 Malignant neoplasm of unspecified site of unspecified female breast: Secondary | ICD-10-CM

## 2012-12-20 DIAGNOSIS — C50419 Malignant neoplasm of upper-outer quadrant of unspecified female breast: Secondary | ICD-10-CM

## 2012-12-20 DIAGNOSIS — R3989 Other symptoms and signs involving the genitourinary system: Secondary | ICD-10-CM

## 2012-12-20 DIAGNOSIS — Z170 Estrogen receptor positive status [ER+]: Secondary | ICD-10-CM

## 2012-12-20 DIAGNOSIS — Z5111 Encounter for antineoplastic chemotherapy: Secondary | ICD-10-CM

## 2012-12-20 LAB — COMPREHENSIVE METABOLIC PANEL (CC13)
ALT: 23 U/L (ref 0–55)
AST: 20 U/L (ref 5–34)
CO2: 25 mEq/L (ref 22–29)
Calcium: 9.7 mg/dL (ref 8.4–10.4)
Creatinine: 0.7 mg/dL (ref 0.6–1.1)
Potassium: 4.6 mEq/L (ref 3.5–5.1)
Total Bilirubin: 0.38 mg/dL (ref 0.20–1.20)
Total Protein: 7 g/dL (ref 6.4–8.3)

## 2012-12-20 LAB — CBC WITH DIFFERENTIAL/PLATELET
Basophils Absolute: 0.1 10*3/uL (ref 0.0–0.1)
Eosinophils Absolute: 0.2 10*3/uL (ref 0.0–0.5)
HGB: 12 g/dL (ref 11.6–15.9)
LYMPH%: 20.5 % (ref 14.0–49.7)
MCV: 93 fL (ref 79.5–101.0)
MONO#: 0.3 10*3/uL (ref 0.1–0.9)
MONO%: 8.4 % (ref 0.0–14.0)
NEUT#: 2.4 10*3/uL (ref 1.5–6.5)
Platelets: 284 10*3/uL (ref 145–400)

## 2012-12-20 MED ORDER — ONDANSETRON 8 MG/NS 50 ML IVPB
INTRAVENOUS | Status: AC
  Filled 2012-12-20: qty 8

## 2012-12-20 MED ORDER — PACLITAXEL PROTEIN-BOUND CHEMO INJECTION 100 MG
100.0000 mg/m2 | Freq: Once | INTRAVENOUS | Status: AC
  Administered 2012-12-20: 200 mg via INTRAVENOUS
  Filled 2012-12-20: qty 40

## 2012-12-20 MED ORDER — ONDANSETRON 8 MG/50ML IVPB (CHCC)
8.0000 mg | Freq: Once | INTRAVENOUS | Status: AC
  Administered 2012-12-20: 8 mg via INTRAVENOUS

## 2012-12-20 MED ORDER — HEPARIN SOD (PORK) LOCK FLUSH 100 UNIT/ML IV SOLN
500.0000 [IU] | Freq: Once | INTRAVENOUS | Status: AC | PRN
  Administered 2012-12-20: 500 [IU]
  Filled 2012-12-20: qty 5

## 2012-12-20 MED ORDER — SODIUM CHLORIDE 0.9 % IV SOLN
Freq: Once | INTRAVENOUS | Status: AC
  Administered 2012-12-20: 13:00:00 via INTRAVENOUS

## 2012-12-20 MED ORDER — SODIUM CHLORIDE 0.9 % IJ SOLN
10.0000 mL | INTRAMUSCULAR | Status: DC | PRN
  Administered 2012-12-20: 10 mL
  Filled 2012-12-20: qty 10

## 2012-12-20 MED ORDER — DEXAMETHASONE SODIUM PHOSPHATE 10 MG/ML IJ SOLN
10.0000 mg | Freq: Once | INTRAMUSCULAR | Status: AC
  Administered 2012-12-20: 10 mg via INTRAVENOUS

## 2012-12-20 MED ORDER — DEXAMETHASONE SODIUM PHOSPHATE 10 MG/ML IJ SOLN
INTRAMUSCULAR | Status: AC
  Filled 2012-12-20: qty 1

## 2012-12-20 NOTE — Patient Instructions (Signed)
[facility] Discharge Instructions for Patients Receiving Chemotherapy  Today you received the following chemotherapy agents Abraxane.   To help prevent nausea and vomiting after your treatment, we encourage you to take your nausea medication.   If you develop nausea and vomiting that is not controlled by your nausea medication, call the clinic.   BELOW ARE SYMPTOMS THAT SHOULD BE REPORTED IMMEDIATELY:  *FEVER GREATER THAN 100.5 F  *CHILLS WITH OR WITHOUT FEVER  NAUSEA AND VOMITING THAT IS NOT CONTROLLED WITH YOUR NAUSEA MEDICATION  *UNUSUAL SHORTNESS OF BREATH  *UNUSUAL BRUISING OR BLEEDING  TENDERNESS IN MOUTH AND THROAT WITH OR WITHOUT PRESENCE OF ULCERS  *URINARY PROBLEMS  *BOWEL PROBLEMS  UNUSUAL RASH Items with * indicate a potential emergency and should be followed up as soon as possible.  Feel free to call the clinic you have any questions or concerns. The clinic phone number is [phone].    

## 2012-12-20 NOTE — Patient Instructions (Signed)
Proceed with chemotherapy today  We will see you back in 1 week

## 2012-12-20 NOTE — Progress Notes (Signed)
OFFICE PROGRESS NOTE  CC  [name],[name], MD [address] [place] [address] Dr. Reuel Boom Pomposini  Dr. Emelia Loron  Dr. Lurline Hare  DIAGNOSIS: 47 year old female with new diagnosis of stage II right breast cancer  STAGE:  Right breast  Stage II,T2NxMx  ER+PR-Her2Neu-  Ki-67 high   PRIOR THERAPY:  #1June 2013 had a mammogram that was normal. But there was on physical exam possibility of a cyst noted in the right breast. The mammogram was negative. In 2014 June patient noted on exam another lump in the right breast. She underwent a diagnostic mammogram on June 10 that showed a right breast nodule in the outer quadrant. She had an ultrasound performed that showed at the 9:30 o'clock position a 3.6 cm area and then added 10:00 position 1.3 cm area with a total area being anywhere between 5-6 cm. The patient went on to have a right breast biopsy performed in [place]. The pathology revealed an invasive ductal carcinoma. This is been confirmed by our pathology as well. The carcinoma and papillary features and was felt to be between a grade 1 and 2. The tumor was estrogen receptor positive strongly (100%) progesterone receptor negative HER-2/neu negative with a Ki-67 that showed a high proliferation rate.  #2 patient was seen by me on 08/22/2012 to discuss treatment options. Due to the size of the tumor I had recommended that patient proceed with neoadjuvant treatment initially consisting of chemotherapy. However due to the workup being incomplete I did recommend that she undergo MRI of the breasts as well as staging studies. When she has had. MRI of the breasts performed on 08/27/2012 revealed in the right upper quadrant irregular lobulated mass with a satellite nodule in ambulation within 2 mm at its superior aspect measured together as 4.5 x 4.0 x 3.8 cm. No lymphadenopathy was noted there was no any other area of abnormal enhancement in either breast. Patient also had PET scans  performed on 73 that revealed the primary breast cancer measuring 3.2 x 3.3 cm with SUV of 16. It was adjacent nodule along his pure medial border of the primary mass measuring 1.4 cm which was also hypermetabolic. There were no additional areas of abnormal hypermetabolism In the chest. Abdomen and pelvis showed no abnormal hypermetabolic activity within the liver pancreas adrenal glands or spleen. No hypermetabolic lymph nodes. In the skeleton no focal hypermetabolic activity to suggest skeletal metastasis.  #3 patient was also seen by Dr. Emelia Loron as well as Dr. Lurline Hare in consultation. Dr. Dwain Sarna has agreed to place a port for eventual chemotherapy. Patient also has had chemotherapy teaching class. The port will be placed on 09/12/2012.  #4. Status post Neoadjuvant AC q 2 weeks on 7/18 she completed this on 10/25/12.    #5 received one dose of Taxol on 11/08/2012 developed significant grade 2 neuropathy.  # 6 patient will begin single agent Abraxane day 1, 8, 15 on a 28 day cycle.   CURRENT THERAPY:  Cycle 2 day 8 of Abraxane  INTERVAL HISTORY: Elizabeth Miles 47 y.o. female returns for evaluation.  She has occasional aching that she takes tylenol for and it improves.  She denies fevers, chills, nausea, vomiting, constipation, diarrhea.    She denies dysuria, oliguria.  Her numbness is improved.  Otherwise, she is well and without concerns. She is saddened since a friend he first had recurrence of her breast cancer. She is very tearful.  Remainder of the 10 point review of systems  is negative.  MEDICAL HISTORY: Past Medical History  Diagnosis Date  . 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 GERD (gastroesophageal reflux disease)   . GERD (gastroesophageal reflux disease) 08/22/2012  . Allergy   . Complication of anesthesia     1986 ; problem waking up  . Anxiety     ALLERGIES:  is allergic to darvocet; shellfish allergy; and other.  MEDICATIONS:  Current  Outpatient Prescriptions  Medication Sig Dispense Refill  . acetaminophen (TYLENOL) 325 MG tablet Take 650 mg by mouth every 4 (four) hours as needed for pain.      . ciprofloxacin (CIPRO) 500 MG tablet Take 1 tablet (500 mg total) by mouth 2 (two) times daily.  14 tablet  0  . Dexlansoprazole (DEXILANT) 30 MG capsule Take 1 capsule (30 mg total) by mouth daily.  90 capsule  4  . hyaluronate sodium (RADIAPLEXRX) GEL Apply 1 application topically once. Apply after rad txs and bedtime,prn      . ibuprofen (ADVIL,MOTRIN) 200 MG tablet Take 200 mg by mouth every 4 (four) hours as needed for pain.      Marland Kitchen lidocaine-prilocaine (EMLA) cream Apply topically as needed.  30 g  7  . loratadine (CLARITIN) 10 MG tablet Take 10 mg by mouth as needed for allergies.      Marland Kitchen ondansetron (ZOFRAN) 8 MG tablet       . PROCTOFOAM HC rectal foam       . triamcinolone cream (KENALOG) 0.1 % Apply topically 2 (two) times daily.  30 g  0   No current facility-administered medications for this visit.    SURGICAL HISTORY:  Past Surgical History  Procedure Laterality Date  . Dilation and curettage of uterus  1993  . Cervical ablation  1983  . Breast biopsy Right 08/06/12  . Popliteal synovial cyst excision  1970  . Portacath placement Left 09/12/2012    Procedure: INSERTION PORT-A-CATH;  Surgeon: Emelia Loron, MD;  Location: [facility] OR;  Service: General;  Laterality: Left;    REVIEW OF SYSTEMS:  A 10 point review of systems was conducted and is otherwise negative except for what is noted above.    PHYSICAL EXAMINATION: Blood pressure 127/90, pulse 90, temperature 98.9 F (37.2 C), temperature source Oral, resp. rate 18, height 5\' 7"  (1.702 m), weight 194 lb 11.2 oz (88.315 kg). Body mass index is 30.49 kg/(m^2). General: Patient is a well appearing female in no acute distress HEENT: PERRLA, sclerae anicteric no conjunctival pallor, no evidence of thrush or mucositis. She does have a chipped tooth the upper right  front of her mouth. Neck: supple, no palpable adenopathy Lungs: clear to auscultation bilaterally, no wheezes, rhonchi, or rales Cardiovascular: regular rate rhythm, S1, S2, no murmurs, rubs or gallops Abdomen: Soft, non-tender, non-distended, normoactive bowel sounds, no HSM Extremities: warm and well perfused, no clubbing, cyanosis, or edema Skin: No rashes or lesions Neuro: Non-focal Breasts: exam deferred  ECOG PERFORMANCE STATUS: 1 - Symptomatic but completely ambulatory   LABORATORY DATA: Lab Results  Component Value Date   WBC 3.7* 12/20/2012   HGB 12.0 12/20/2012   HCT 36.1 12/20/2012   MCV 93.0 12/20/2012   PLT 284 12/20/2012      Chemistry      Component Value Date/Time   NA 142 12/13/2012 1242   K 4.6 12/13/2012 1242   CO2 29 12/13/2012 1242   BUN 8.9 12/13/2012 1242   CREATININE 0.7 12/13/2012 1242  Component Value Date/Time   CALCIUM 10.1 12/13/2012 1242   ALKPHOS 61 12/13/2012 1242   AST 20 12/13/2012 1242   ALT 28 12/13/2012 1242   BILITOT 0.50 12/13/2012 1242       RADIOGRAPHIC STUDIES:  Ct Chest W Contrast  08/29/2012   *RADIOLOGY REPORT*  Clinical Data:  Breast cancer.  CT CHEST, ABDOMEN AND PELVIS WITH CONTRAST  Technique:  Multidetector CT imaging of the chest, abdomen and pelvis was performed following the standard protocol during bolus administration of intravenous contrast.  Contrast: OMNIPAQUE IOHEXOL 300 MG/ML  SOLN  Comparison:  PET 08/29/2012.  CT CHEST  Findings:  No pathologically enlarged mediastinal, hilar, axillary or internal mammary lymph nodes.  Primary right breast mass measures 3.1 x 3.4 cm.  There is a smaller nodule along the superior medial margin of the primary mass, measuring 1.2 x 1.3 cm. Heart size normal.  No pericardial effusion.  Minimal biapical pleural parenchymal scarring.  An oblong 4 mm (2 x 6 mm) nodule along the left major fissure is most consistent with a subpleural lymph node.  Lungs are otherwise clear.  No  pleural fluid.  Airway is unremarkable.  IMPRESSION: Right breast lesions, consistent with the given history of breast cancer, without evidence of metastatic disease.  CT ABDOMEN AND PELVIS  Findings:  Liver, gallbladder, adrenal glands, kidneys, spleen, pancreas, stomach and small bowel are unremarkable.  Appendix is normal.  A fair amount of stool is seen in the colon, indicative of constipation.  No pathologically enlarged lymph nodes.  Uterus and ovaries are visualized.  No free fluid.  No worrisome lytic or sclerotic lesions.  IMPRESSION: No evidence of metastatic disease in the abdomen or pelvis.   Original Report Authenticated By: Leanna Battles, M.D.   Ct Abdomen Pelvis W Contrast  08/29/2012   *RADIOLOGY REPORT*  Clinical Data:  Breast cancer.  CT CHEST, ABDOMEN AND PELVIS WITH CONTRAST  Technique:  Multidetector CT imaging of the chest, abdomen and pelvis was performed following the standard protocol during bolus administration of intravenous contrast.  Contrast: OMNIPAQUE IOHEXOL 300 MG/ML  SOLN  Comparison:  PET 08/29/2012.  CT CHEST  Findings:  No pathologically enlarged mediastinal, hilar, axillary or internal mammary lymph nodes.  Primary right breast mass measures 3.1 x 3.4 cm.  There is a smaller nodule along the superior medial margin of the primary mass, measuring 1.2 x 1.3 cm. Heart size normal.  No pericardial effusion.  Minimal biapical pleural parenchymal scarring.  An oblong 4 mm (2 x 6 mm) nodule along the left major fissure is most consistent with a subpleural lymph node.  Lungs are otherwise clear.  No pleural fluid.  Airway is unremarkable.  IMPRESSION: Right breast lesions, consistent with the given history of breast cancer, without evidence of metastatic disease.  CT ABDOMEN AND PELVIS  Findings:  Liver, gallbladder, adrenal glands, kidneys, spleen, pancreas, stomach and small bowel are unremarkable.  Appendix is normal.  A fair amount of stool is seen in the colon, indicative  of constipation.  No pathologically enlarged lymph nodes.  Uterus and ovaries are visualized.  No free fluid.  No worrisome lytic or sclerotic lesions.  IMPRESSION: No evidence of metastatic disease in the abdomen or pelvis.   Original Report Authenticated By: Leanna Battles, M.D.   Mr Breast Bilateral W Wo Contrast  08/27/2012   *RADIOLOGY REPORT*  Clinical Data: Newly-diagnosed right breast invasive ductal carcinoma manifesting as a palpable mass.  BILATERAL BREAST MRI WITH AND WITHOUT  CONTRAST  Technique: Multiplanar, multisequence MR images of both breasts were obtained prior to and following the intravenous administration of 16ml of Multihance.  Three dimensional images were evaluated at the independent DynaCad workstation.  Comparison:  [place] mammograms 2014 and 2012.  We are not provided with post biopsy mammograms to indicate whether a clip was placed.  Findings: Background parenchymal enhancement pattern is mild. Breast parenchymal pattern suggests heterogeneously dense parenchyma.  In the right breast upper outer quadrant is an irregular lobulated mass with central T2 hyperintensity suggesting necrosis or biopsy artifact, with a satellite nodule or lobulation within 2 mm at its superior aspect, measured together as 4.5 x 4.0 x 3.8 cm.  This demonstrates washin/washout type enhancement kinetics.  This corresponds to the reported biopsy-proven breast cancer.  Internal signal voids are noted which may indicate gas, clip artifact, or less likely calcification.  There is extension of enhancement to the nipple, suggesting nipple involvement may be present.  No other area of abnormal enhancement is seen in either breast.  No lymphadenopathy or T2-weighted hyperintensity elsewhere in either breast.  IMPRESSION: Dominant right breast upper outer quadrant irregular mass corresponding to the biopsy-proven breast cancer, measuring 4.5 cm in total and including the satellite nodule or lobulation at its superior  aspect, which is within 2 mm of the dominant mass.  It is not clear from the provided images whether a clip was placed at prior biopsy.  If the patient is to undergo neoadjuvant chemotherapy, placement of a clip is highly recommended, because the mass could become occult on imaging if there is a favorable treatment response, and therefore may not be accurately localized if lumpectomy is planned.  Extension of enhancement from the mass to the right nipple may suggest nipple involvement.  No evidence for contralateral left-sided abnormal enhancement.  RECOMMENDATION: Treatment plan  THREE-DIMENSIONAL MR IMAGE RENDERING ON INDEPENDENT WORKSTATION:  Three-dimensional MR images were rendered by post-processing of the original MR data on an independent workstation.  The three- dimensional MR images were interpreted, and findings were reported in the accompanying complete MRI report for this study.  BI-RADS CATEGORY 6:  Known biopsy-proven malignancy - appropriate action should be taken.   Original Report Authenticated By: Christiana Pellant, M.D.   Nm Pet Image Initial (pi) Skull Base To Thigh  08/29/2012   *RADIOLOGY REPORT*  Clinical Data: Initial treatment strategy for breast cancer.  NUCLEAR MEDICINE PET SKULL BASE TO THIGH  Fasting Blood Glucose:  88  Technique:  17.6 mCi F-18 FDG was injected intravenously. CT data was obtained and used for attenuation correction and anatomic localization only.  (This was not acquired as a diagnostic CT examination.) Additional exam technical data entered on technologist worksheet.  Comparison:  CT chest abdomen pelvis 08/29/2012.  Findings:  Neck: No hypermetabolic lymph nodes in the neck.  Hypermetabolic brown fat is noted.  CT images show no acute findings.  Chest:  No hypermetabolic mediastinal or hilar lymph nodes.  No suspicious pulmonary nodules.  There is hypermetabolic brown fat.  Primary right breast mass measures 3.2 x 3.3 cm with an S U V max of 16.0.  There is an adjacent  nodule along the superomedial border of the primary mass, measuring 1.4 cm, also hypermetabolic.  No additional areas of abnormal hypermetabolism in the chest.  CT images show no acute findings.  Specifically, no pericardial or pleural effusion.  Abdomen/Pelvis:  No abnormal hypermetabolic activity within the liver, pancreas, adrenal glands or spleen.  No hypermetabolic lymph nodes.  CT images show no acute findings.  Skeleton:  No focal hypermetabolic activity to suggest skeletal metastasis.  IMPRESSION: Hypermetabolic right breast lesions, consistent with the given history breast cancer, without evidence of metastatic disease.   Original Report Authenticated By: Leanna Battles, M.D.   Dg Chest Port 1 View  09/12/2012   *RADIOLOGY REPORT*  Clinical Data: Port-A-Cath placement  PORTABLE CHEST - 1 VIEW  Comparison: CT chest 08/29/2012  Findings: Left subclavian Port-A-Cath tip in the mid SVC.  No pneumothorax  The lungs are clear.  IMPRESSION: Satisfactory Port-A-Cath placement.   Original Report Authenticated By: Janeece Riggers, M.D.   Dg Fluoro Guide Cv Line-no Report  09/12/2012   CLINICAL DATA: right breast mass   FLOURO GUIDE CV LINE  Fluoroscopy was utilized by the requesting physician.  No radiographic  interpretation.     ASSESSMENT: 47 year old female with  #1 T2 NX MX invasive ductal carcinoma of the breast found a self breast examination of the right breast. She is a good candidate for neoadjuvant chemotherapy. We discussed treatment with Adriamycin Cytoxan followed by Taxol. She will receive dose dense a.c. for 4 cycles followed by Taxol weekly for 12 weeks.   #2 s/p neoadjuvant chemotherapy with AC on 7/18-. 10/25/2012 for a Total 4 cycles   #3 patient was begun on Taxol and adjuvantly. She received one cycle on 11/08/2012. 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   #4 Cycle 2 day 1 Abraxane neoadjuvantly. Plan  is for her to have a total 4 cycles  #5 chipped tooth, she was evaluated by her dentist, and her tooth is stable to continue chemotherapy.  She will receive a crown once chemotherapy is complete.     PLAN:  #1 Patient is doing well.  Labs are stable.  She will proceed with Abraxane today.    #2 She does have some symptoms that are concerning for a urinary tract infection.  We will get a urinalysis and culture today and prescribe Cipro if needed.   #3 She will return next week for cycle 2 day 15 of abraxane along with labs and an appointment.    All questions were answered. The patient knows to call the clinic with any problems, questions or concerns. We can certainly see the patient much sooner if necessary.  I spent 25 minutes counseling the patient face to face. The total time spent in the appointment was 35 minutes.  Drue Second, MD Medical/Oncology [facility] [phone] (beeper) [phone] (Office)  12/20/2012, 12:34 PM

## 2012-12-27 ENCOUNTER — Ambulatory Visit (HOSPITAL_BASED_OUTPATIENT_CLINIC_OR_DEPARTMENT_OTHER): Payer: BC Managed Care – PPO

## 2012-12-27 ENCOUNTER — Telehealth: Payer: Self-pay | Admitting: Oncology

## 2012-12-27 ENCOUNTER — Other Ambulatory Visit (HOSPITAL_BASED_OUTPATIENT_CLINIC_OR_DEPARTMENT_OTHER): Payer: BC Managed Care – PPO | Admitting: Lab

## 2012-12-27 ENCOUNTER — Telehealth: Payer: Self-pay | Admitting: *Deleted

## 2012-12-27 ENCOUNTER — Encounter: Payer: Self-pay | Admitting: Adult Health

## 2012-12-27 ENCOUNTER — Ambulatory Visit (HOSPITAL_BASED_OUTPATIENT_CLINIC_OR_DEPARTMENT_OTHER): Payer: BC Managed Care – PPO | Admitting: Adult Health

## 2012-12-27 VITALS — BP 118/80 | HR 90 | Temp 98.1°F | Resp 18 | Ht 67.0 in | Wt 196.5 lb

## 2012-12-27 DIAGNOSIS — C50919 Malignant neoplasm of unspecified site of unspecified female breast: Secondary | ICD-10-CM

## 2012-12-27 DIAGNOSIS — C50911 Malignant neoplasm of unspecified site of right female breast: Secondary | ICD-10-CM

## 2012-12-27 DIAGNOSIS — Z5111 Encounter for antineoplastic chemotherapy: Secondary | ICD-10-CM

## 2012-12-27 DIAGNOSIS — C50419 Malignant neoplasm of upper-outer quadrant of unspecified female breast: Secondary | ICD-10-CM

## 2012-12-27 DIAGNOSIS — Z170 Estrogen receptor positive status [ER+]: Secondary | ICD-10-CM

## 2012-12-27 LAB — COMPREHENSIVE METABOLIC PANEL (CC13)
ALT: 21 U/L (ref 0–55)
AST: 16 U/L (ref 5–34)
Alkaline Phosphatase: 61 U/L (ref 40–150)
Anion Gap: 9 mEq/L (ref 3–11)
CO2: 26 mEq/L (ref 22–29)
Creatinine: 0.7 mg/dL (ref 0.6–1.1)
Potassium: 4.2 mEq/L (ref 3.5–5.1)
Sodium: 140 mEq/L (ref 136–145)
Total Bilirubin: 0.31 mg/dL (ref 0.20–1.20)

## 2012-12-27 LAB — CBC WITH DIFFERENTIAL/PLATELET
BASO%: 1.1 % (ref 0.0–2.0)
EOS%: 5 % (ref 0.0–7.0)
Eosinophils Absolute: 0.2 10*3/uL (ref 0.0–0.5)
HCT: 35.7 % (ref 34.8–46.6)
HGB: 11.9 g/dL (ref 11.6–15.9)
LYMPH%: 21 % (ref 14.0–49.7)
MCH: 31.1 pg (ref 25.1–34.0)
MCHC: 33.4 g/dL (ref 31.5–36.0)
MONO%: 8.9 % (ref 0.0–14.0)
NEUT%: 64 % (ref 38.4–76.8)
Platelets: 297 10*3/uL (ref 145–400)
WBC: 3.2 10*3/uL — ABNORMAL LOW (ref 3.9–10.3)

## 2012-12-27 MED ORDER — ONDANSETRON 8 MG/NS 50 ML IVPB
INTRAVENOUS | Status: AC
  Filled 2012-12-27: qty 8

## 2012-12-27 MED ORDER — ONDANSETRON 8 MG/50ML IVPB (CHCC)
8.0000 mg | Freq: Once | INTRAVENOUS | Status: AC
  Administered 2012-12-27: 8 mg via INTRAVENOUS

## 2012-12-27 MED ORDER — SODIUM CHLORIDE 0.9 % IV SOLN
Freq: Once | INTRAVENOUS | Status: AC
  Administered 2012-12-27: 15:00:00 via INTRAVENOUS

## 2012-12-27 MED ORDER — DEXAMETHASONE SODIUM PHOSPHATE 10 MG/ML IJ SOLN
10.0000 mg | Freq: Once | INTRAMUSCULAR | Status: AC
  Administered 2012-12-27: 10 mg via INTRAVENOUS

## 2012-12-27 MED ORDER — DEXAMETHASONE SODIUM PHOSPHATE 10 MG/ML IJ SOLN
INTRAMUSCULAR | Status: AC
  Filled 2012-12-27: qty 1

## 2012-12-27 MED ORDER — HEPARIN SOD (PORK) LOCK FLUSH 100 UNIT/ML IV SOLN
500.0000 [IU] | Freq: Once | INTRAVENOUS | Status: AC | PRN
  Administered 2012-12-27: 500 [IU]
  Filled 2012-12-27: qty 5

## 2012-12-27 MED ORDER — SODIUM CHLORIDE 0.9 % IJ SOLN
10.0000 mL | INTRAMUSCULAR | Status: DC | PRN
  Administered 2012-12-27: 10 mL
  Filled 2012-12-27: qty 10

## 2012-12-27 MED ORDER — PACLITAXEL PROTEIN-BOUND CHEMO INJECTION 100 MG
100.0000 mg/m2 | Freq: Once | INTRAVENOUS | Status: AC
  Administered 2012-12-27: 200 mg via INTRAVENOUS
  Filled 2012-12-27: qty 40

## 2012-12-27 NOTE — Patient Instructions (Signed)
Doing well.  Proceed with treatment today.  Please call us if you have any questions or concerns.    

## 2012-12-27 NOTE — Telephone Encounter (Signed)
Per staff message and POF I have scheduled appts.  JMW  

## 2012-12-27 NOTE — Patient Instructions (Signed)
[facility] Discharge Instructions for Patients Receiving Chemotherapy  Today you received the following chemotherapy agents Abraxane.  To help prevent nausea and vomiting after your treatment, we encourage you to take your nausea medication Zofran 8 mg as ordered by prescribing MD.   If you develop nausea and vomiting that is not controlled by your nausea medication, call the clinic.   BELOW ARE SYMPTOMS THAT SHOULD BE REPORTED IMMEDIATELY:  *FEVER GREATER THAN 100.5 F  *CHILLS WITH OR WITHOUT FEVER  NAUSEA AND VOMITING THAT IS NOT CONTROLLED WITH YOUR NAUSEA MEDICATION  *UNUSUAL SHORTNESS OF BREATH  *UNUSUAL BRUISING OR BLEEDING  TENDERNESS IN MOUTH AND THROAT WITH OR WITHOUT PRESENCE OF ULCERS  *URINARY PROBLEMS  *BOWEL PROBLEMS  UNUSUAL RASH Items with * indicate a potential emergency and should be followed up as soon as possible.  Feel free to call the clinic you have any questions or concerns. The clinic phone number is [phone].

## 2012-12-27 NOTE — Progress Notes (Addendum)
OFFICE PROGRESS NOTE  CC  [name],[name], MD [address] [place] [address] Dr. Reuel Boom Pomposini  Dr. Emelia Loron  Dr. Lurline Hare  DIAGNOSIS: 47 year old female with new diagnosis of stage II right breast cancer  STAGE:  Right breast  Stage II,T2NxMx  ER+PR-Her2Neu-  Ki-67 high   PRIOR THERAPY:  #1June 2013 had a mammogram that was normal. But there was on physical exam possibility of a cyst noted in the right breast. The mammogram was negative. In 2014 June patient noted on exam another lump in the right breast. She underwent a diagnostic mammogram on June 10 that showed a right breast nodule in the outer quadrant. She had an ultrasound performed that showed at the 9:30 o'clock position a 3.6 cm area and then added 10:00 position 1.3 cm area with a total area being anywhere between 5-6 cm. The patient went on to have a right breast biopsy performed in [place]. The pathology revealed an invasive ductal carcinoma. This is been confirmed by our pathology as well. The carcinoma and papillary features and was felt to be between a grade 1 and 2. The tumor was estrogen receptor positive strongly (100%) progesterone receptor negative HER-2/neu negative with a Ki-67 that showed a high proliferation rate.  #2 patient was seen by me on 08/22/2012 to discuss treatment options. Due to the size of the tumor I had recommended that patient proceed with neoadjuvant treatment initially consisting of chemotherapy. However due to the workup being incomplete I did recommend that she undergo MRI of the breasts as well as staging studies. When she has had. MRI of the breasts performed on 08/27/2012 revealed in the right upper quadrant irregular lobulated mass with a satellite nodule in ambulation within 2 mm at its superior aspect measured together as 4.5 x 4.0 x 3.8 cm. No lymphadenopathy was noted there was no any other area of abnormal enhancement in either breast. Patient also had PET scans  performed on 73 that revealed the primary breast cancer measuring 3.2 x 3.3 cm with SUV of 16. It was adjacent nodule along his pure medial border of the primary mass measuring 1.4 cm which was also hypermetabolic. There were no additional areas of abnormal hypermetabolism In the chest. Abdomen and pelvis showed no abnormal hypermetabolic activity within the liver pancreas adrenal glands or spleen. No hypermetabolic lymph nodes. In the skeleton no focal hypermetabolic activity to suggest skeletal metastasis.  #3 patient was also seen by Dr. Emelia Loron as well as Dr. Lurline Hare in consultation. Dr. Dwain Sarna has agreed to place a port for eventual chemotherapy. Patient also has had chemotherapy teaching class. The port will be placed on 09/12/2012.  #4. Status post Neoadjuvant AC q 2 weeks on 7/18 she completed this on 10/25/12.    #5 received one dose of Taxol on 11/08/2012 developed significant grade 2 neuropathy.  # 6 patient will begin single agent Abraxane day 1, 8, 15 on a 28 day cycle.   CURRENT THERAPY:  Cycle 2 day 15 of Abraxane  INTERVAL HISTORY: Elizabeth Miles 47 y.o. female returns for evaluation. She's doing well today.  She denies fevers, chills, nausea, vomiting, constipation, diarrhea.  Her numbness is improved.  She is at the stage of her treatment where she is feeling angry about her diagnosis.  Otherwise, a 10 point ROS is neg.   MEDICAL HISTORY: Past Medical History  Diagnosis Date  . 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 GERD (gastroesophageal reflux  disease)   . GERD (gastroesophageal reflux disease) 08/22/2012  . Allergy   . Complication of anesthesia     1986 ; problem waking up  . Anxiety     ALLERGIES:  is allergic to darvocet; shellfish allergy; and other.  MEDICATIONS:  Current Outpatient Prescriptions  Medication Sig Dispense Refill  . acetaminophen (TYLENOL) 325 MG tablet Take 650 mg by mouth every 4 (four) hours as needed for  pain.      . ciprofloxacin (CIPRO) 500 MG tablet Take 1 tablet (500 mg total) by mouth 2 (two) times daily.  14 tablet  0  . Dexlansoprazole (DEXILANT) 30 MG capsule Take 1 capsule (30 mg total) by mouth daily.  90 capsule  4  . hyaluronate sodium (RADIAPLEXRX) GEL Apply 1 application topically once. Apply after rad txs and bedtime,prn      . ibuprofen (ADVIL,MOTRIN) 200 MG tablet Take 200 mg by mouth every 4 (four) hours as needed for pain.      Marland Kitchen lidocaine-prilocaine (EMLA) cream Apply topically as needed.  30 g  7  . loratadine (CLARITIN) 10 MG tablet Take 10 mg by mouth as needed for allergies.      Marland Kitchen ondansetron (ZOFRAN) 8 MG tablet       . PROCTOFOAM HC rectal foam       . triamcinolone cream (KENALOG) 0.1 % Apply topically 2 (two) times daily.  30 g  0   No current facility-administered medications for this visit.    SURGICAL HISTORY:  Past Surgical History  Procedure Laterality Date  . Dilation and curettage of uterus  1993  . Cervical ablation  1983  . Breast biopsy Right 08/06/12  . Popliteal synovial cyst excision  1970  . Portacath placement Left 09/12/2012    Procedure: INSERTION PORT-A-CATH;  Surgeon: Emelia Loron, MD;  Location: [facility] OR;  Service: General;  Laterality: Left;    REVIEW OF SYSTEMS:  A 10 point review of systems was conducted and is otherwise negative except for what is noted above.    PHYSICAL EXAMINATION: Blood pressure 118/80, pulse 90, temperature 98.1 F (36.7 C), temperature source Oral, resp. rate 18, height 5\' 7"  (1.702 m), weight 196 lb 8 oz (89.132 kg). Body mass index is 30.77 kg/(m^2). General: Patient is a well appearing female in no acute distress HEENT: PERRLA, sclerae anicteric no conjunctival pallor, no evidence of thrush or mucositis. She does have a chipped tooth the upper right front of her mouth. Neck: supple, no palpable adenopathy Lungs: clear to auscultation bilaterally, no wheezes, rhonchi, or rales Cardiovascular: regular rate  rhythm, S1, S2, no murmurs, rubs or gallops Abdomen: Soft, non-tender, non-distended, normoactive bowel sounds, no HSM Extremities: warm and well perfused, no clubbing, cyanosis, or edema Skin: No rashes or lesions Neuro: Non-focal Breasts: exam deferred ECOG PERFORMANCE STATUS: 1 - Symptomatic but completely ambulatory   LABORATORY DATA: Lab Results  Component Value Date   WBC 3.2* 12/27/2012   HGB 11.9 12/27/2012   HCT 35.7 12/27/2012   MCV 93.0 12/27/2012   PLT 297 12/27/2012      Chemistry      Component Value Date/Time   NA 140 12/27/2012 1246   K 4.2 12/27/2012 1246   CO2 26 12/27/2012 1246   BUN 8.7 12/27/2012 1246   CREATININE 0.7 12/27/2012 1246      Component Value Date/Time   CALCIUM 9.8 12/27/2012 1246   ALKPHOS 61 12/27/2012 1246   AST 16 12/27/2012 1246   ALT 21 12/27/2012 1246  BILITOT 0.31 12/27/2012 1246       RADIOGRAPHIC STUDIES:  Ct Chest W Contrast  08/29/2012   *RADIOLOGY REPORT*  Clinical Data:  Breast cancer.  CT CHEST, ABDOMEN AND PELVIS WITH CONTRAST  Technique:  Multidetector CT imaging of the chest, abdomen and pelvis was performed following the standard protocol during bolus administration of intravenous contrast.  Contrast: OMNIPAQUE IOHEXOL 300 MG/ML  SOLN  Comparison:  PET 08/29/2012.  CT CHEST  Findings:  No pathologically enlarged mediastinal, hilar, axillary or internal mammary lymph nodes.  Primary right breast mass measures 3.1 x 3.4 cm.  There is a smaller nodule along the superior medial margin of the primary mass, measuring 1.2 x 1.3 cm. Heart size normal.  No pericardial effusion.  Minimal biapical pleural parenchymal scarring.  An oblong 4 mm (2 x 6 mm) nodule along the left major fissure is most consistent with a subpleural lymph node.  Lungs are otherwise clear.  No pleural fluid.  Airway is unremarkable.  IMPRESSION: Right breast lesions, consistent with the given history of breast cancer, without evidence of metastatic  disease.  CT ABDOMEN AND PELVIS  Findings:  Liver, gallbladder, adrenal glands, kidneys, spleen, pancreas, stomach and small bowel are unremarkable.  Appendix is normal.  A fair amount of stool is seen in the colon, indicative of constipation.  No pathologically enlarged lymph nodes.  Uterus and ovaries are visualized.  No free fluid.  No worrisome lytic or sclerotic lesions.  IMPRESSION: No evidence of metastatic disease in the abdomen or pelvis.   Original Report Authenticated By: Leanna Battles, M.D.   Ct Abdomen Pelvis W Contrast  08/29/2012   *RADIOLOGY REPORT*  Clinical Data:  Breast cancer.  CT CHEST, ABDOMEN AND PELVIS WITH CONTRAST  Technique:  Multidetector CT imaging of the chest, abdomen and pelvis was performed following the standard protocol during bolus administration of intravenous contrast.  Contrast: OMNIPAQUE IOHEXOL 300 MG/ML  SOLN  Comparison:  PET 08/29/2012.  CT CHEST  Findings:  No pathologically enlarged mediastinal, hilar, axillary or internal mammary lymph nodes.  Primary right breast mass measures 3.1 x 3.4 cm.  There is a smaller nodule along the superior medial margin of the primary mass, measuring 1.2 x 1.3 cm. Heart size normal.  No pericardial effusion.  Minimal biapical pleural parenchymal scarring.  An oblong 4 mm (2 x 6 mm) nodule along the left major fissure is most consistent with a subpleural lymph node.  Lungs are otherwise clear.  No pleural fluid.  Airway is unremarkable.  IMPRESSION: Right breast lesions, consistent with the given history of breast cancer, without evidence of metastatic disease.  CT ABDOMEN AND PELVIS  Findings:  Liver, gallbladder, adrenal glands, kidneys, spleen, pancreas, stomach and small bowel are unremarkable.  Appendix is normal.  A fair amount of stool is seen in the colon, indicative of constipation.  No pathologically enlarged lymph nodes.  Uterus and ovaries are visualized.  No free fluid.  No worrisome lytic or sclerotic lesions.   IMPRESSION: No evidence of metastatic disease in the abdomen or pelvis.   Original Report Authenticated By: Leanna Battles, M.D.   Mr Breast Bilateral W Wo Contrast  08/27/2012   *RADIOLOGY REPORT*  Clinical Data: Newly-diagnosed right breast invasive ductal carcinoma manifesting as a palpable mass.  BILATERAL BREAST MRI WITH AND WITHOUT CONTRAST  Technique: Multiplanar, multisequence MR images of both breasts were obtained prior to and following the intravenous administration of 16ml of Multihance.  Three dimensional images were evaluated  at the independent DynaCad workstation.  Comparison:  [place] mammograms 2014 and 2012.  We are not provided with post biopsy mammograms to indicate whether a clip was placed.  Findings: Background parenchymal enhancement pattern is mild. Breast parenchymal pattern suggests heterogeneously dense parenchyma.  In the right breast upper outer quadrant is an irregular lobulated mass with central T2 hyperintensity suggesting necrosis or biopsy artifact, with a satellite nodule or lobulation within 2 mm at its superior aspect, measured together as 4.5 x 4.0 x 3.8 cm.  This demonstrates washin/washout type enhancement kinetics.  This corresponds to the reported biopsy-proven breast cancer.  Internal signal voids are noted which may indicate gas, clip artifact, or less likely calcification.  There is extension of enhancement to the nipple, suggesting nipple involvement may be present.  No other area of abnormal enhancement is seen in either breast.  No lymphadenopathy or T2-weighted hyperintensity elsewhere in either breast.  IMPRESSION: Dominant right breast upper outer quadrant irregular mass corresponding to the biopsy-proven breast cancer, measuring 4.5 cm in total and including the satellite nodule or lobulation at its superior aspect, which is within 2 mm of the dominant mass.  It is not clear from the provided images whether a clip was placed at prior biopsy.  If the patient is  to undergo neoadjuvant chemotherapy, placement of a clip is highly recommended, because the mass could become occult on imaging if there is a favorable treatment response, and therefore may not be accurately localized if lumpectomy is planned.  Extension of enhancement from the mass to the right nipple may suggest nipple involvement.  No evidence for contralateral left-sided abnormal enhancement.  RECOMMENDATION: Treatment plan  THREE-DIMENSIONAL MR IMAGE RENDERING ON INDEPENDENT WORKSTATION:  Three-dimensional MR images were rendered by post-processing of the original MR data on an independent workstation.  The three- dimensional MR images were interpreted, and findings were reported in the accompanying complete MRI report for this study.  BI-RADS CATEGORY 6:  Known biopsy-proven malignancy - appropriate action should be taken.   Original Report Authenticated By: Christiana Pellant, M.D.   Nm Pet Image Initial (pi) Skull Base To Thigh  08/29/2012   *RADIOLOGY REPORT*  Clinical Data: Initial treatment strategy for breast cancer.  NUCLEAR MEDICINE PET SKULL BASE TO THIGH  Fasting Blood Glucose:  88  Technique:  17.6 mCi F-18 FDG was injected intravenously. CT data was obtained and used for attenuation correction and anatomic localization only.  (This was not acquired as a diagnostic CT examination.) Additional exam technical data entered on technologist worksheet.  Comparison:  CT chest abdomen pelvis 08/29/2012.  Findings:  Neck: No hypermetabolic lymph nodes in the neck.  Hypermetabolic brown fat is noted.  CT images show no acute findings.  Chest:  No hypermetabolic mediastinal or hilar lymph nodes.  No suspicious pulmonary nodules.  There is hypermetabolic brown fat.  Primary right breast mass measures 3.2 x 3.3 cm with an S U V max of 16.0.  There is an adjacent nodule along the superomedial border of the primary mass, measuring 1.4 cm, also hypermetabolic.  No additional areas of abnormal hypermetabolism in the  chest.  CT images show no acute findings.  Specifically, no pericardial or pleural effusion.  Abdomen/Pelvis:  No abnormal hypermetabolic activity within the liver, pancreas, adrenal glands or spleen.  No hypermetabolic lymph nodes.  CT images show no acute findings.  Skeleton:  No focal hypermetabolic activity to suggest skeletal metastasis.  IMPRESSION: Hypermetabolic right breast lesions, consistent with the given history breast  cancer, without evidence of metastatic disease.   Original Report Authenticated By: Leanna Battles, M.D.   Dg Chest Port 1 View  09/12/2012   *RADIOLOGY REPORT*  Clinical Data: Port-A-Cath placement  PORTABLE CHEST - 1 VIEW  Comparison: CT chest 08/29/2012  Findings: Left subclavian Port-A-Cath tip in the mid SVC.  No pneumothorax  The lungs are clear.  IMPRESSION: Satisfactory Port-A-Cath placement.   Original Report Authenticated By: Janeece Riggers, M.D.   Dg Fluoro Guide Cv Line-no Report  09/12/2012   CLINICAL DATA: right breast mass   FLOURO GUIDE CV LINE  Fluoroscopy was utilized by the requesting physician.  No radiographic  interpretation.     ASSESSMENT: 47 year old female with  #1 T2 NX MX invasive ductal carcinoma of the breast found a self breast examination of the right breast. She is a good candidate for neoadjuvant chemotherapy. We discussed treatment with Adriamycin Cytoxan followed by Taxol. She will receive dose dense a.c. for 4 cycles followed by Taxol weekly for 12 weeks.   #2 s/p neoadjuvant chemotherapy with AC on 7/18-. 10/25/2012 for a Total 4 cycles   #3 patient was begun on Taxol and adjuvantly. She received one cycle on 11/08/2012. 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   #4 Cycle 2 day 15 Abraxane neoadjuvantly. Plan is for her to have a total 4 cycles  #5 chipped tooth, she was evaluated by her dentist, and her tooth is stable to continue chemotherapy.  She will  receive a crown once chemotherapy is complete.     PLAN:  #1 Patient is doing well.  She will proceed with chemotherapy today.  Her labs are stable, I reviewed them with her in detail.    #2 She and I discussed her anger and that it is a normal process of her diagnosis and treatment.    #3 She will return in 1 week for labs and an evaluation for chemotoxicities.   All questions were answered. The patient knows to call the clinic with any problems, questions or concerns. We can certainly see the patient much sooner if necessary.  I spent 25 minutes counseling the patient face to face. The total time spent in the appointment was 35 minutes.  Illa Level, NP Medical Oncology [facility] [phone]   12/28/2012, 3:28 PM  ATTENDING'S ATTESTATION:  I personally reviewed patient's chart, examined patient myself, formulated the treatment plan as followed.    Patient is tolerating her chemotherapy beautifully. She is on Abraxane now which is significantly helped reduce the neuropathy that she had felt with the Taxol. She has not had any side effects from the Abraxane. She will proceed with cycle 2 day 15 today. She will have 1 week of rest he'll be seen back in 2 weeks' time. He is see her dentist for her.  Drue Second, MD Medical/Oncology [facility] [phone] (beeper) [phone] (Office)  01/02/2013, 7:11 PM

## 2013-01-02 ENCOUNTER — Other Ambulatory Visit: Payer: Self-pay

## 2013-01-03 ENCOUNTER — Encounter: Payer: Self-pay | Admitting: Adult Health

## 2013-01-03 ENCOUNTER — Ambulatory Visit: Payer: BC Managed Care – PPO

## 2013-01-03 ENCOUNTER — Other Ambulatory Visit (HOSPITAL_BASED_OUTPATIENT_CLINIC_OR_DEPARTMENT_OTHER): Payer: BC Managed Care – PPO

## 2013-01-03 ENCOUNTER — Ambulatory Visit (HOSPITAL_BASED_OUTPATIENT_CLINIC_OR_DEPARTMENT_OTHER): Payer: BC Managed Care – PPO | Admitting: Adult Health

## 2013-01-03 ENCOUNTER — Telehealth: Payer: Self-pay | Admitting: *Deleted

## 2013-01-03 VITALS — BP 133/85 | HR 97 | Temp 98.2°F | Resp 20 | Ht 67.0 in | Wt 197.4 lb

## 2013-01-03 DIAGNOSIS — C50919 Malignant neoplasm of unspecified site of unspecified female breast: Secondary | ICD-10-CM

## 2013-01-03 DIAGNOSIS — C50419 Malignant neoplasm of upper-outer quadrant of unspecified female breast: Secondary | ICD-10-CM

## 2013-01-03 LAB — COMPREHENSIVE METABOLIC PANEL (CC13)
ALT: 22 U/L (ref 0–55)
Anion Gap: 12 mEq/L — ABNORMAL HIGH (ref 3–11)
BUN: 7.2 mg/dL (ref 7.0–26.0)
CO2: 23 mEq/L (ref 22–29)
Calcium: 10.1 mg/dL (ref 8.4–10.4)
Chloride: 106 mEq/L (ref 98–109)
Creatinine: 0.7 mg/dL (ref 0.6–1.1)
Glucose: 136 mg/dl (ref 70–140)
Total Bilirubin: 0.38 mg/dL (ref 0.20–1.20)

## 2013-01-03 LAB — CBC WITH DIFFERENTIAL/PLATELET
Basophils Absolute: 0 10*3/uL (ref 0.0–0.1)
Eosinophils Absolute: 0.1 10*3/uL (ref 0.0–0.5)
HCT: 37.3 % (ref 34.8–46.6)
HGB: 12.3 g/dL (ref 11.6–15.9)
LYMPH%: 21.9 % (ref 14.0–49.7)
MONO#: 0.2 10*3/uL (ref 0.1–0.9)
NEUT#: 2.2 10*3/uL (ref 1.5–6.5)
NEUT%: 66.7 % (ref 38.4–76.8)
Platelets: 321 10*3/uL (ref 145–400)
RBC: 4.02 10*6/uL (ref 3.70–5.45)
WBC: 3.3 10*3/uL — ABNORMAL LOW (ref 3.9–10.3)
lymph#: 0.7 10*3/uL — ABNORMAL LOW (ref 0.9–3.3)

## 2013-01-03 NOTE — Telephone Encounter (Signed)
appts made and printed. Pt stated she will call to make her flu w/ Dwain Sarna the week of 02/10/13...td

## 2013-01-03 NOTE — Progress Notes (Signed)
OFFICE PROGRESS NOTE  CC  [name],[name], MD [address] [place] [address] Dr. Reuel Boom Pomposini  Dr. Emelia Loron  Dr. Lurline Hare  DIAGNOSIS: 47 year old female with new diagnosis of stage II right breast cancer  STAGE:  Right breast  Stage II,T2NxMx  ER+PR-Her2Neu-  Ki-67 high   PRIOR THERAPY:  #1June 2013 had a mammogram that was normal. But there was on physical exam possibility of a cyst noted in the right breast. The mammogram was negative. In 2014 June patient noted on exam another lump in the right breast. She underwent a diagnostic mammogram on June 10 that showed a right breast nodule in the outer quadrant. She had an ultrasound performed that showed at the 9:30 o'clock position a 3.6 cm area and then added 10:00 position 1.3 cm area with a total area being anywhere between 5-6 cm. The patient went on to have a right breast biopsy performed in [place]. The pathology revealed an invasive ductal carcinoma. This is been confirmed by our pathology as well. The carcinoma and papillary features and was felt to be between a grade 1 and 2. The tumor was estrogen receptor positive strongly (100%) progesterone receptor negative HER-2/neu negative with a Ki-67 that showed a high proliferation rate.  #2 patient was seen by me on 08/22/2012 to discuss treatment options. Due to the size of the tumor I had recommended that patient proceed with neoadjuvant treatment initially consisting of chemotherapy. However due to the workup being incomplete I did recommend that she undergo MRI of the breasts as well as staging studies. When she has had. MRI of the breasts performed on 08/27/2012 revealed in the right upper quadrant irregular lobulated mass with a satellite nodule in ambulation within 2 mm at its superior aspect measured together as 4.5 x 4.0 x 3.8 cm. No lymphadenopathy was noted there was no any other area of abnormal enhancement in either breast. Patient also had PET scans  performed on 73 that revealed the primary breast cancer measuring 3.2 x 3.3 cm with SUV of 16. It was adjacent nodule along his pure medial border of the primary mass measuring 1.4 cm which was also hypermetabolic. There were no additional areas of abnormal hypermetabolism In the chest. Abdomen and pelvis showed no abnormal hypermetabolic activity within the liver pancreas adrenal glands or spleen. No hypermetabolic lymph nodes. In the skeleton no focal hypermetabolic activity to suggest skeletal metastasis.  #3 patient was also seen by Dr. Emelia Loron as well as Dr. Lurline Hare in consultation. Dr. Dwain Sarna has agreed to place a port for eventual chemotherapy. Patient also has had chemotherapy teaching class. The port will be placed on 09/12/2012.  #4. Status post Neoadjuvant AC q 2 weeks on 7/18 she completed this on 10/25/12.    #5 received one dose of Taxol on 11/08/2012 developed significant grade 2 neuropathy.  # 6 patient will begin single agent Abraxane day 1, 8, 15 on a 28 day cycle.   CURRENT THERAPY:  Cycle 2 day 21 of Abraxane  INTERVAL HISTORY: Elizabeth Miles 47 y.o. female returns for evaluation. She's doing well today.  She denies fevers, chills, nausea, vomiting, constipation, diarrhea, numbness.  She is very fatigued.  Otherwise, a 10 point ROS is neg.   MEDICAL HISTORY: Past Medical History  Diagnosis Date  . 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 GERD (gastroesophageal reflux disease)   . GERD (gastroesophageal reflux disease) 08/22/2012  . Allergy   . Complication  of anesthesia     1986 ; problem waking up  . Anxiety     ALLERGIES:  is allergic to darvocet; shellfish allergy; and other.  MEDICATIONS:  Current Outpatient Prescriptions  Medication Sig Dispense Refill  . acetaminophen (TYLENOL) 325 MG tablet Take 650 mg by mouth every 4 (four) hours as needed for pain.      . ciprofloxacin (CIPRO) 500 MG tablet Take 1 tablet (500 mg total) by  mouth 2 (two) times daily.  14 tablet  0  . Dexlansoprazole (DEXILANT) 30 MG capsule Take 1 capsule (30 mg total) by mouth daily.  90 capsule  4  . hyaluronate sodium (RADIAPLEXRX) GEL Apply 1 application topically once. Apply after rad txs and bedtime,prn      . ibuprofen (ADVIL,MOTRIN) 200 MG tablet Take 200 mg by mouth every 4 (four) hours as needed for pain.      Marland Kitchen lidocaine-prilocaine (EMLA) cream Apply topically as needed.  30 g  7  . loratadine (CLARITIN) 10 MG tablet Take 10 mg by mouth as needed for allergies.      Marland Kitchen ondansetron (ZOFRAN) 8 MG tablet       . PROCTOFOAM HC rectal foam       . triamcinolone cream (KENALOG) 0.1 % Apply topically 2 (two) times daily.  30 g  0   No current facility-administered medications for this visit.    SURGICAL HISTORY:  Past Surgical History  Procedure Laterality Date  . Dilation and curettage of uterus  1993  . Cervical ablation  1983  . Breast biopsy Right 08/06/12  . Popliteal synovial cyst excision  1970  . Portacath placement Left 09/12/2012    Procedure: INSERTION PORT-A-CATH;  Surgeon: Emelia Loron, MD;  Location: [facility] OR;  Service: General;  Laterality: Left;    REVIEW OF SYSTEMS:  A 10 point review of systems was conducted and is otherwise negative except for what is noted above.    PHYSICAL EXAMINATION: Blood pressure 133/85, pulse 97, temperature 98.2 F (36.8 C), temperature source Oral, resp. rate 20, height 5\' 7"  (1.702 m), weight 197 lb 6.4 oz (89.54 kg). Body mass index is 30.91 kg/(m^2). General: Patient is a well appearing female in no acute distress HEENT: PERRLA, sclerae anicteric no conjunctival pallor, no evidence of thrush or mucositis. She does have a chipped tooth the upper right front of her mouth. Neck: supple, no palpable adenopathy Lungs: clear to auscultation bilaterally, no wheezes, rhonchi, or rales Cardiovascular: regular rate rhythm, S1, S2, no murmurs, rubs or gallops Abdomen: Soft, non-tender,  non-distended, normoactive bowel sounds, no HSM Extremities: warm and well perfused, no clubbing, cyanosis, or edema Skin: No rashes or lesions Neuro: Non-focal Breasts: exam deferred ECOG PERFORMANCE STATUS: 1 - Symptomatic but completely ambulatory   LABORATORY DATA: Lab Results  Component Value Date   WBC 3.3* 01/03/2013   HGB 12.3 01/03/2013   HCT 37.3 01/03/2013   MCV 92.9 01/03/2013   PLT 321 01/03/2013      Chemistry      Component Value Date/Time   NA 140 12/27/2012 1246   K 4.2 12/27/2012 1246   CO2 26 12/27/2012 1246   BUN 8.7 12/27/2012 1246   CREATININE 0.7 12/27/2012 1246      Component Value Date/Time   CALCIUM 9.8 12/27/2012 1246   ALKPHOS 61 12/27/2012 1246   AST 16 12/27/2012 1246   ALT 21 12/27/2012 1246   BILITOT 0.31 12/27/2012 1246       RADIOGRAPHIC STUDIES:  Ct  Chest W Contrast  08/29/2012   *RADIOLOGY REPORT*  Clinical Data:  Breast cancer.  CT CHEST, ABDOMEN AND PELVIS WITH CONTRAST  Technique:  Multidetector CT imaging of the chest, abdomen and pelvis was performed following the standard protocol during bolus administration of intravenous contrast.  Contrast: OMNIPAQUE IOHEXOL 300 MG/ML  SOLN  Comparison:  PET 08/29/2012.  CT CHEST  Findings:  No pathologically enlarged mediastinal, hilar, axillary or internal mammary lymph nodes.  Primary right breast mass measures 3.1 x 3.4 cm.  There is a smaller nodule along the superior medial margin of the primary mass, measuring 1.2 x 1.3 cm. Heart size normal.  No pericardial effusion.  Minimal biapical pleural parenchymal scarring.  An oblong 4 mm (2 x 6 mm) nodule along the left major fissure is most consistent with a subpleural lymph node.  Lungs are otherwise clear.  No pleural fluid.  Airway is unremarkable.  IMPRESSION: Right breast lesions, consistent with the given history of breast cancer, without evidence of metastatic disease.  CT ABDOMEN AND PELVIS  Findings:  Liver, gallbladder, adrenal glands,  kidneys, spleen, pancreas, stomach and small bowel are unremarkable.  Appendix is normal.  A fair amount of stool is seen in the colon, indicative of constipation.  No pathologically enlarged lymph nodes.  Uterus and ovaries are visualized.  No free fluid.  No worrisome lytic or sclerotic lesions.  IMPRESSION: No evidence of metastatic disease in the abdomen or pelvis.   Original Report Authenticated By: Leanna Battles, M.D.   Ct Abdomen Pelvis W Contrast  08/29/2012   *RADIOLOGY REPORT*  Clinical Data:  Breast cancer.  CT CHEST, ABDOMEN AND PELVIS WITH CONTRAST  Technique:  Multidetector CT imaging of the chest, abdomen and pelvis was performed following the standard protocol during bolus administration of intravenous contrast.  Contrast: OMNIPAQUE IOHEXOL 300 MG/ML  SOLN  Comparison:  PET 08/29/2012.  CT CHEST  Findings:  No pathologically enlarged mediastinal, hilar, axillary or internal mammary lymph nodes.  Primary right breast mass measures 3.1 x 3.4 cm.  There is a smaller nodule along the superior medial margin of the primary mass, measuring 1.2 x 1.3 cm. Heart size normal.  No pericardial effusion.  Minimal biapical pleural parenchymal scarring.  An oblong 4 mm (2 x 6 mm) nodule along the left major fissure is most consistent with a subpleural lymph node.  Lungs are otherwise clear.  No pleural fluid.  Airway is unremarkable.  IMPRESSION: Right breast lesions, consistent with the given history of breast cancer, without evidence of metastatic disease.  CT ABDOMEN AND PELVIS  Findings:  Liver, gallbladder, adrenal glands, kidneys, spleen, pancreas, stomach and small bowel are unremarkable.  Appendix is normal.  A fair amount of stool is seen in the colon, indicative of constipation.  No pathologically enlarged lymph nodes.  Uterus and ovaries are visualized.  No free fluid.  No worrisome lytic or sclerotic lesions.  IMPRESSION: No evidence of metastatic disease in the abdomen or pelvis.   Original  Report Authenticated By: Leanna Battles, M.D.   Mr Breast Bilateral W Wo Contrast  08/27/2012   *RADIOLOGY REPORT*  Clinical Data: Newly-diagnosed right breast invasive ductal carcinoma manifesting as a palpable mass.  BILATERAL BREAST MRI WITH AND WITHOUT CONTRAST  Technique: Multiplanar, multisequence MR images of both breasts were obtained prior to and following the intravenous administration of 16ml of Multihance.  Three dimensional images were evaluated at the independent DynaCad workstation.  Comparison:  [place] mammograms 2014 and 2012.  We are not provided with post biopsy mammograms to indicate whether a clip was placed.  Findings: Background parenchymal enhancement pattern is mild. Breast parenchymal pattern suggests heterogeneously dense parenchyma.  In the right breast upper outer quadrant is an irregular lobulated mass with central T2 hyperintensity suggesting necrosis or biopsy artifact, with a satellite nodule or lobulation within 2 mm at its superior aspect, measured together as 4.5 x 4.0 x 3.8 cm.  This demonstrates washin/washout type enhancement kinetics.  This corresponds to the reported biopsy-proven breast cancer.  Internal signal voids are noted which may indicate gas, clip artifact, or less likely calcification.  There is extension of enhancement to the nipple, suggesting nipple involvement may be present.  No other area of abnormal enhancement is seen in either breast.  No lymphadenopathy or T2-weighted hyperintensity elsewhere in either breast.  IMPRESSION: Dominant right breast upper outer quadrant irregular mass corresponding to the biopsy-proven breast cancer, measuring 4.5 cm in total and including the satellite nodule or lobulation at its superior aspect, which is within 2 mm of the dominant mass.  It is not clear from the provided images whether a clip was placed at prior biopsy.  If the patient is to undergo neoadjuvant chemotherapy, placement of a clip is highly recommended,  because the mass could become occult on imaging if there is a favorable treatment response, and therefore may not be accurately localized if lumpectomy is planned.  Extension of enhancement from the mass to the right nipple may suggest nipple involvement.  No evidence for contralateral left-sided abnormal enhancement.  RECOMMENDATION: Treatment plan  THREE-DIMENSIONAL MR IMAGE RENDERING ON INDEPENDENT WORKSTATION:  Three-dimensional MR images were rendered by post-processing of the original MR data on an independent workstation.  The three- dimensional MR images were interpreted, and findings were reported in the accompanying complete MRI report for this study.  BI-RADS CATEGORY 6:  Known biopsy-proven malignancy - appropriate action should be taken.   Original Report Authenticated By: Christiana Pellant, M.D.   Nm Pet Image Initial (pi) Skull Base To Thigh  08/29/2012   *RADIOLOGY REPORT*  Clinical Data: Initial treatment strategy for breast cancer.  NUCLEAR MEDICINE PET SKULL BASE TO THIGH  Fasting Blood Glucose:  88  Technique:  17.6 mCi F-18 FDG was injected intravenously. CT data was obtained and used for attenuation correction and anatomic localization only.  (This was not acquired as a diagnostic CT examination.) Additional exam technical data entered on technologist worksheet.  Comparison:  CT chest abdomen pelvis 08/29/2012.  Findings:  Neck: No hypermetabolic lymph nodes in the neck.  Hypermetabolic brown fat is noted.  CT images show no acute findings.  Chest:  No hypermetabolic mediastinal or hilar lymph nodes.  No suspicious pulmonary nodules.  There is hypermetabolic brown fat.  Primary right breast mass measures 3.2 x 3.3 cm with an S U V max of 16.0.  There is an adjacent nodule along the superomedial border of the primary mass, measuring 1.4 cm, also hypermetabolic.  No additional areas of abnormal hypermetabolism in the chest.  CT images show no acute findings.  Specifically, no pericardial or  pleural effusion.  Abdomen/Pelvis:  No abnormal hypermetabolic activity within the liver, pancreas, adrenal glands or spleen.  No hypermetabolic lymph nodes.  CT images show no acute findings.  Skeleton:  No focal hypermetabolic activity to suggest skeletal metastasis.  IMPRESSION: Hypermetabolic right breast lesions, consistent with the given history breast cancer, without evidence of metastatic disease.   Original Report Authenticated By: Leanna Battles,  M.D.   Dg Chest Port 1 View  09/12/2012   *RADIOLOGY REPORT*  Clinical Data: Port-A-Cath placement  PORTABLE CHEST - 1 VIEW  Comparison: CT chest 08/29/2012  Findings: Left subclavian Port-A-Cath tip in the mid SVC.  No pneumothorax  The lungs are clear.  IMPRESSION: Satisfactory Port-A-Cath placement.   Original Report Authenticated By: Janeece Riggers, M.D.   Dg Fluoro Guide Cv Line-no Report  09/12/2012   CLINICAL DATA: right breast mass   FLOURO GUIDE CV LINE  Fluoroscopy was utilized by the requesting physician.  No radiographic  interpretation.     ASSESSMENT: 47 year old female with  #1 T2 NX MX invasive ductal carcinoma of the breast found a self breast examination of the right breast. She is a good candidate for neoadjuvant chemotherapy. We discussed treatment with Adriamycin Cytoxan followed by Taxol. She will receive dose dense a.c. for 4 cycles followed by Taxol weekly for 12 weeks.   #2 s/p neoadjuvant chemotherapy with AC on 7/18-. 10/25/2012 for a Total 4 cycles   #3 patient was begun on Taxol and adjuvantly. She received one cycle on 11/08/2012. 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   #4 Cycle 2 day 15 Abraxane neoadjuvantly. Plan is for her to have a total 4 cycles  #5 chipped tooth, she was evaluated by her dentist, and her tooth is stable to continue chemotherapy.  She will receive a crown once chemotherapy is complete.     PLAN:  #1 Patient is  doing well.  Her labs are stable, I reviewed them with her in detail.    #2 She will return in one week to continue on Abraxane.  I ordered her MRI of the breasts and requested f/u with Dr. Dwain Sarna following the MRI.    All questions were answered. The patient knows to call the clinic with any problems, questions or concerns. We can certainly see the patient much sooner if necessary.  I spent 25 minutes counseling the patient face to face. The total time spent in the appointment was 35 minutes.  Illa Level, NP Medical Oncology [facility] [phone] 01/03/2013, 1:27 PM

## 2013-01-10 ENCOUNTER — Other Ambulatory Visit (HOSPITAL_BASED_OUTPATIENT_CLINIC_OR_DEPARTMENT_OTHER): Payer: BC Managed Care – PPO

## 2013-01-10 ENCOUNTER — Ambulatory Visit (HOSPITAL_BASED_OUTPATIENT_CLINIC_OR_DEPARTMENT_OTHER): Payer: BC Managed Care – PPO | Admitting: Family

## 2013-01-10 ENCOUNTER — Encounter: Payer: Self-pay | Admitting: Family

## 2013-01-10 ENCOUNTER — Ambulatory Visit (HOSPITAL_BASED_OUTPATIENT_CLINIC_OR_DEPARTMENT_OTHER): Payer: BC Managed Care – PPO

## 2013-01-10 VITALS — BP 127/85 | HR 84 | Temp 97.9°F | Resp 18 | Ht 67.0 in | Wt 198.7 lb

## 2013-01-10 DIAGNOSIS — C50919 Malignant neoplasm of unspecified site of unspecified female breast: Secondary | ICD-10-CM

## 2013-01-10 DIAGNOSIS — M7989 Other specified soft tissue disorders: Secondary | ICD-10-CM

## 2013-01-10 DIAGNOSIS — Z5111 Encounter for antineoplastic chemotherapy: Secondary | ICD-10-CM

## 2013-01-10 DIAGNOSIS — K219 Gastro-esophageal reflux disease without esophagitis: Secondary | ICD-10-CM

## 2013-01-10 DIAGNOSIS — C50419 Malignant neoplasm of upper-outer quadrant of unspecified female breast: Secondary | ICD-10-CM

## 2013-01-10 DIAGNOSIS — G609 Hereditary and idiopathic neuropathy, unspecified: Secondary | ICD-10-CM

## 2013-01-10 DIAGNOSIS — Z170 Estrogen receptor positive status [ER+]: Secondary | ICD-10-CM

## 2013-01-10 DIAGNOSIS — C50911 Malignant neoplasm of unspecified site of right female breast: Secondary | ICD-10-CM

## 2013-01-10 LAB — COMPREHENSIVE METABOLIC PANEL (CC13)
AST: 23 U/L (ref 5–34)
Anion Gap: 9 mEq/L (ref 3–11)
BUN: 10.8 mg/dL (ref 7.0–26.0)
CO2: 27 mEq/L (ref 22–29)
Calcium: 9.9 mg/dL (ref 8.4–10.4)
Chloride: 105 mEq/L (ref 98–109)
Creatinine: 1 mg/dL (ref 0.6–1.1)
Total Bilirubin: 0.38 mg/dL (ref 0.20–1.20)

## 2013-01-10 LAB — CBC WITH DIFFERENTIAL/PLATELET
BASO%: 1.3 % (ref 0.0–2.0)
Basophils Absolute: 0.1 10*3/uL (ref 0.0–0.1)
EOS%: 3.1 % (ref 0.0–7.0)
Eosinophils Absolute: 0.1 10*3/uL (ref 0.0–0.5)
HCT: 38.8 % (ref 34.8–46.6)
HGB: 12.7 g/dL (ref 11.6–15.9)
LYMPH%: 19.6 % (ref 14.0–49.7)
MCH: 30.1 pg (ref 25.1–34.0)
MCHC: 32.6 g/dL (ref 31.5–36.0)
MCV: 92.3 fL (ref 79.5–101.0)
NEUT#: 2.5 10*3/uL (ref 1.5–6.5)
NEUT%: 62.8 % (ref 38.4–76.8)
Platelets: 281 10*3/uL (ref 145–400)
lymph#: 0.8 10*3/uL — ABNORMAL LOW (ref 0.9–3.3)

## 2013-01-10 MED ORDER — DEXAMETHASONE SODIUM PHOSPHATE 10 MG/ML IJ SOLN
INTRAMUSCULAR | Status: AC
  Filled 2013-01-10: qty 1

## 2013-01-10 MED ORDER — PACLITAXEL PROTEIN-BOUND CHEMO INJECTION 100 MG
100.0000 mg/m2 | Freq: Once | INTRAVENOUS | Status: AC
  Administered 2013-01-10: 200 mg via INTRAVENOUS
  Filled 2013-01-10: qty 40

## 2013-01-10 MED ORDER — DEXAMETHASONE SODIUM PHOSPHATE 10 MG/ML IJ SOLN
10.0000 mg | Freq: Once | INTRAMUSCULAR | Status: AC
  Administered 2013-01-10: 10 mg via INTRAVENOUS

## 2013-01-10 MED ORDER — ONDANSETRON 8 MG/NS 50 ML IVPB
INTRAVENOUS | Status: AC
  Filled 2013-01-10: qty 8

## 2013-01-10 MED ORDER — ONDANSETRON 8 MG/50ML IVPB (CHCC)
8.0000 mg | Freq: Once | INTRAVENOUS | Status: AC
  Administered 2013-01-10: 8 mg via INTRAVENOUS

## 2013-01-10 MED ORDER — SODIUM CHLORIDE 0.9 % IV SOLN
Freq: Once | INTRAVENOUS | Status: AC
  Administered 2013-01-10: 15:00:00 via INTRAVENOUS

## 2013-01-10 MED ORDER — HEPARIN SOD (PORK) LOCK FLUSH 100 UNIT/ML IV SOLN
500.0000 [IU] | Freq: Once | INTRAVENOUS | Status: AC | PRN
  Administered 2013-01-10: 500 [IU]
  Filled 2013-01-10: qty 5

## 2013-01-10 MED ORDER — SODIUM CHLORIDE 0.9 % IJ SOLN
10.0000 mL | INTRAMUSCULAR | Status: DC | PRN
  Administered 2013-01-10: 10 mL
  Filled 2013-01-10: qty 10

## 2013-01-10 NOTE — Progress Notes (Signed)
[facility]  Telephone:(336) [phone] Fax:(336) [phone]  OFFICE PROGRESS NOTE  ID: Elizabeth Miles   DOB: 27-Oct-1965  MR#: [id number]  JYN#:[id number]   PCP: Maximiano Coss, MD [place] PCP:  Georgiann Mccoy, MD   SU:  Emelia Loron, MD RAD ONC:   Lurline Hare, MD  DIAGNOSIS:  Elizabeth Miles is a 47 y.o.  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  STAGE:  Right breast invasive ductal carcinoma Stage II, T2 Nx Mx  ER +, PR -, Her2/Neu -, Ki-67 high   PRIOR THERAPY: #1 Normal mammogram in 07/2011. On physical exam however the possibility of a cyst was noted in the right breast. In 07/2012 patient noted on exam another lump in the right breast. She underwent a diagnostic mammogram on 08/06/2012 that showed a right breast nodule in the outer quadrant. She had an ultrasound performed that showed at the 9:30 o'clock position a 3.6 cm area, and a 10:00 position a 1.3 cm area with a total area measuring between 5-6 cm.  Right breast needle core biopsy on 08/06/2012  performed in [place], [place] showed pathology  that revealed an intermediate grade invasive ductal carcinoma. (This is been confirmed by our pathology as well).  The tumor was estrogen receptor positive strongly (100%), progesterone receptor negative,  HER-2/neu negative, with a Ki-67 that showed a high proliferation rate.  #2 Elizabeth Miles  was seen by Dr. Welton Flakes on 08/22/2012 to discuss treatment options. Due to the size of the tumor Dr. Welton Flakes recommended that she proceed with neoadjuvant treatment initially consisting of chemotherapy. However due to the workup being incomplete I did recommend that she undergo MRI of the breasts as well as staging studies.   #3 Bilateral breast MRI on 08/27/2012 revealed in the right upper quadrant irregular lobulated mass with a satellite nodule or lobulation within 2 mm at its superior aspect, measured together as 4.5 x  4.0 x 3.8 cm.  There was extension of enhancement to the nipple, suggesting nipple involvement may be present.  No lymphadenopathy was noted there was no any other area of abnormal enhancement in either breast (clinical stage IIA, T2 N0).  #4 PET scan performed on 08/29/2012 revealed the primary breast cancer measuring 3.2 x 3.3 cm with SUV of 16. It was adjacent nodule along the superiomedial border of the primary mass measuring 1.4 cm which was also hypermetabolic. There were no additional areas of abnormal hypermetabolism.  No abnormal hypermetabolic activity was seen in the chest, abdomen/pelvis,within the liver, pancreas, adrenal glands or spleen. No hypermetabolic lymph nodes. In the skeleton, no focal hypermetabolic activity to suggest skeletal metastasis was seen.  #5 Patient also seen by Dr. Emelia Loron and Dr. Lurline Hare in consultation. Dr. Dwain Sarna  place the Port-A-Cath on 09/12/2012.  Patient attended a chemotherapy teaching class.   #6 Completed neoadjuvant chemotherapy consisting of Q14 day Adriamycin/Cytoxan x 4 cycles on 10/25/2012.  #7 Received one dose of neoadjuvant chemotherapy with Taxol on 11/08/2012.  She developed significant grade 2 neuropathy and Taxol was discontinued.  #8 Started on 11/15/2012 with neoadjuvant chemotherapy consisting of single agent Abraxane given on day 1, 8, 15 of each 28 day cycle.    CURRENT THERAPY:  Single agent Abraxane given on day 1, 8, 15 of each 28 day cycle with 4 cycles planned.   INTERVAL HISTORY: Elizabeth Miles is a  47 y.o. female who returns for evaluation.  She is accompanied  by her father Tais, mother Harriett Sine, and friend Misty Stanley for today's office visit.  Since her last office visit on 01/03/2013, she states that she's been doing fairly well with the exception of shortness of breath with exertion.  She states she was walking [place] the stairs at the mall approximately one week ago and she began feeling shortness of breath.  She  denies any chest pain or any other symptomatology associated with the shortness of breath.  She also has complaints of fatigue, swelling in both legs without pain, and reflux.  She states that her neuropathy is better.  She also states that she does not like the way her chipped tooth appears.  She denies any other symptomatology and her interval history was otherwise stable.  MEDICAL HISTORY: Past Medical History  Diagnosis Date  . 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 GERD (gastroesophageal reflux disease)   . GERD (gastroesophageal reflux disease) 08/22/2012  . Allergy   . Complication of anesthesia     1986 ; problem waking up  . Anxiety     ALLERGIES:   Allergies  Allergen Reactions  . Darvocet [Propoxyphene-Acetaminophen] Shortness Of Breath and Swelling  . Shellfish Allergy Shortness Of Breath and Swelling  . Other     CT dye, "chest hurt" "makes me feel cold"     MEDICATIONS:  Current Outpatient Prescriptions  Medication Sig Dispense Refill  . acetaminophen (TYLENOL) 325 MG tablet Take 650 mg by mouth every 4 (four) hours as needed for pain.      . ciprofloxacin (CIPRO) 500 MG tablet Take 1 tablet (500 mg total) by mouth 2 (two) times daily.  14 tablet  0  . Dexlansoprazole (DEXILANT) 30 MG capsule Take 1 capsule (30 mg total) by mouth daily.  90 capsule  4  . hyaluronate sodium (RADIAPLEXRX) GEL Apply 1 application topically once. Apply after rad txs and bedtime,prn      . ibuprofen (ADVIL,MOTRIN) 200 MG tablet Take 200 mg by mouth every 4 (four) hours as needed for pain.      Marland Kitchen lidocaine-prilocaine (EMLA) cream Apply topically as needed.  30 g  7  . loratadine (CLARITIN) 10 MG tablet Take 10 mg by mouth as needed for allergies.      Marland Kitchen triamcinolone cream (KENALOG) 0.1 % Apply topically 2 (two) times daily.  30 g  0  . ondansetron (ZOFRAN) 8 MG tablet Take 8 mg by mouth every 8 (eight) hours as needed.        No current facility-administered medications for  this visit.    SURGICAL HISTORY:  Past Surgical History  Procedure Laterality Date  . Dilation and curettage of uterus  1993  . Cervical ablation  1983  . Breast biopsy Right 08/06/12  . Popliteal synovial cyst excision  1970  . Portacath placement Left 09/12/2012    Procedure: INSERTION PORT-A-CATH;  Surgeon: Emelia Loron, MD;  Location: [facility] OR;  Service: General;  Laterality: Left;    REVIEW OF SYSTEMS:  A 10 point review of systems was completed and is negative except as noted above. Elizabeth Miles denies any other symptomatology including fever or chills, headache, vision changes, swollen glands, cough, chest pain or discomfort, nausea, vomiting, diarrhea, constipation, change in urinary or bowel habits, arthralgias/myalgias, unusual bleeding/bruising or any other symptomatology.    PHYSICAL EXAMINATION: Blood pressure 127/85, pulse 84, temperature 97.9 F (36.6 C), temperature source Oral, resp. rate 18, height 5\' 7"  (1.702 m), weight 198 lb 11.2  oz (90.13 kg). Body mass index is 31.11 kg/(m^2).  ECOG PERFORMANCE STATUS: 1 - Symptomatic but completely ambulatory  General appearance: Alert, cooperative, well nourished, no apparent distress Head: Normocephalic, chemotherapy-induced alopecia, atraumatic, the patient is wearing a head scarf, front chipped/cracked tooth Eyes: Conjunctivae/corneas clear, PERRLA, EOMI Nose: Nares, septum and mucosa are normal, no drainage or sinus tenderness Neck: No adenopathy, supple, symmetrical, trachea midline, no tenderness Resp: Clear to auscultation bilaterally, no wheezes/rales/rhonchi Cardio: Regular rate and rhythm, S1, S2 normal, no murmur, click, rub or gallop, no edema, left Port-A-Cath without signs of infection Breasts:  Deferred  GI: Soft, distended, non-tender, hypoactive bowel sounds, no organomegaly Skin: No rashes/lesions, skin warm and dry, no erythematous areas, no cyanosis  M/S:  Atraumatic, normal strength in all extremities,  normal range of motion, no clubbing  Lymph nodes: Cervical, supraclavicular, and axillary nodes normal Neurologic: Grossly normal, cranial nerves II through XII intact, alert and oriented x 3 Psych: Appropriate affect   LABORATORY DATA: Lab Results  Component Value Date   WBC 4.0 01/10/2013   HGB 12.7 01/10/2013   HCT 38.8 01/10/2013   MCV 92.3 01/10/2013   PLT 281 01/10/2013      Chemistry      Component Value Date/Time   NA 141 01/10/2013 1312   K 4.5 01/10/2013 1312   CO2 27 01/10/2013 1312   BUN 10.8 01/10/2013 1312   CREATININE 1.0 01/10/2013 1312      Component Value Date/Time   CALCIUM 9.9 01/10/2013 1312   ALKPHOS 60 01/10/2013 1312   AST 23 01/10/2013 1312   ALT 27 01/10/2013 1312   BILITOT 0.38 01/10/2013 1312       RADIOGRAPHIC STUDIES: No results found.   ASSESSMENT: Elizabeth Miles is a 47 y.o. female diagnosed by biopsy with stage II, invasive ductal carcinoma of the right breast in 07/2012:  #1 Completed neoadjuvant chemotherapy consisting of Q14 day Adriamycin/Cytoxan x 4 cycles on 10/25/2012.  #2 Received one dose of neoadjuvant chemotherapy with Taxol on 11/08/2012.  She developed significant grade 2 neuropathy and Taxol was discontinued.  #3 Started on 11/15/2012 with neoadjuvant chemotherapy consisting of single agent Abraxane given on day 1, 8, 15 of each 28 day cycle with 4 cycles planned.   #4 Chipped tooth was evaluated by her dentist, and tooth is stable enough to continue chemotherapy.  She will receive a crown once chemotherapy is complete.    #5 Shortness of breath with exertion/neuropathy/fatigue/reflux/bilateral lower extremity swelling without pain   PLAN:  #1 Her laboratory values are stable and she will proceed with neoadjuvant chemotherapy cycle number 3, day 8 on today.  #2 We plan to see her again on 01/17/2013 for chemotherapy cycle #3, day 15.  We will check laboratories of CBC and CMP at that time.  #3 The patient declined  a CTA of the chest regarding her shortness of breath with exertion to rule out pulmonary embolism.  We will continue to monitor her neuropathy/fatigue/GERD/bilateral lower extremity swelling without pain during her next chemotherapy cycles.  Right now the patient states her symptoms are tolerable.  All questions answered.  Elizabeth Miles and her family/friend were encouraged to contact us in the interim with any questions, concerns, or problems.  Larina Bras, NP-C 01/14/2013    7:01 PM

## 2013-01-10 NOTE — Patient Instructions (Signed)
[facility] Discharge Instructions for Patients Receiving Chemotherapy  Today you received the following chemotherapy agents Abraxane  To help prevent nausea and vomiting after your treatment, we encourage you to take your nausea medication as prescribed.   If you develop nausea and vomiting that is not controlled by your nausea medication, call the clinic.   BELOW ARE SYMPTOMS THAT SHOULD BE REPORTED IMMEDIATELY:  *FEVER GREATER THAN 100.5 F  *CHILLS WITH OR WITHOUT FEVER  NAUSEA AND VOMITING THAT IS NOT CONTROLLED WITH YOUR NAUSEA MEDICATION  *UNUSUAL SHORTNESS OF BREATH  *UNUSUAL BRUISING OR BLEEDING  TENDERNESS IN MOUTH AND THROAT WITH OR WITHOUT PRESENCE OF ULCERS  *URINARY PROBLEMS  *BOWEL PROBLEMS  UNUSUAL RASH Items with * indicate a potential emergency and should be followed up as soon as possible.  Feel free to call the clinic you have any questions or concerns. The clinic phone number is [phone].    

## 2013-01-10 NOTE — Patient Instructions (Addendum)
Please contact us at [phone] if you have any questions or concerns.  Please continue to do well and enjoy life!!!  Get plenty of rest, drink plenty of water, exercise daily (walking), eat a balanced diet.  Take  vitamin D3 1000 IUs daily.   Complete monthly self-breast examinations.   Results for orders placed in visit on 01/10/13 (from the past 24 hour(s))  CBC WITH DIFFERENTIAL     Status: Abnormal   Collection Time    01/10/13  1:11 PM      Result Value Range   WBC 4.0  3.9 - 10.3 10e3/uL   NEUT# 2.5  1.5 - 6.5 10e3/uL   HGB 12.7  11.6 - 15.9 g/dL   HCT 29.5  62.1 - 30.8 %   Platelets 281  145 - 400 10e3/uL   MCV 92.3  79.5 - 101.0 fL   MCH 30.1  25.1 - 34.0 pg   MCHC 32.6  31.5 - 36.0 g/dL   RBC 6.57  8.46 - 9.62 10e6/uL   RDW 14.2  11.2 - 14.5 %   lymph# 0.8 (*) 0.9 - 3.3 10e3/uL   MONO# 0.5  0.1 - 0.9 10e3/uL   Eosinophils Absolute 0.1  0.0 - 0.5 10e3/uL   Basophils Absolute 0.1  0.0 - 0.1 10e3/uL   NEUT% 62.8  38.4 - 76.8 %   LYMPH% 19.6  14.0 - 49.7 %   MONO% 13.2  0.0 - 14.0 %   EOS% 3.1  0.0 - 7.0 %   BASO% 1.3  0.0 - 2.0 %   Narrative:    Performed At:  [facility]               [address]               [place], [place] [address]  COMPREHENSIVE METABOLIC PANEL (CC13)     Status: None   Collection Time    01/10/13  1:12 PM      Result Value Range   Sodium 141  136 - 145 mEq/L   Potassium 4.5  3.5 - 5.1 mEq/L   Chloride 105  98 - 109 mEq/L   CO2 27  22 - 29 mEq/L   Glucose 93  70 - 140 mg/dl   BUN 13.2  7.0 - 44.0 mg/dL   Creatinine 1.0  0.6 - 1.1 mg/dL   Total Bilirubin 1.02  0.20 - 1.20 mg/dL   Alkaline Phosphatase 60  40 - 150 U/L   AST 23  5 - 34 U/L   ALT 27  0 - 55 U/L   Total Protein 7.1  6.4 - 8.3 g/dL   Albumin 4.0  3.5 - 5.0 g/dL   Calcium 9.9  8.4 - 72.5 mg/dL   Anion Gap 9  3 - 11 mEq/L   Narrative:    Performed At:  [facility]               [address]               [place], [place] [address]

## 2013-01-16 ENCOUNTER — Telehealth: Payer: Self-pay | Admitting: *Deleted

## 2013-01-16 NOTE — Telephone Encounter (Signed)
Per staff message and POF I have scheduled appts.  JMW  

## 2013-01-17 ENCOUNTER — Other Ambulatory Visit (HOSPITAL_BASED_OUTPATIENT_CLINIC_OR_DEPARTMENT_OTHER): Payer: BC Managed Care – PPO | Admitting: Lab

## 2013-01-17 ENCOUNTER — Ambulatory Visit (HOSPITAL_BASED_OUTPATIENT_CLINIC_OR_DEPARTMENT_OTHER): Payer: BC Managed Care – PPO | Admitting: Adult Health

## 2013-01-17 ENCOUNTER — Ambulatory Visit (HOSPITAL_BASED_OUTPATIENT_CLINIC_OR_DEPARTMENT_OTHER): Payer: BC Managed Care – PPO

## 2013-01-17 ENCOUNTER — Encounter: Payer: Self-pay | Admitting: Adult Health

## 2013-01-17 VITALS — BP 122/80 | HR 100 | Temp 97.9°F | Resp 18 | Ht 67.0 in | Wt 200.4 lb

## 2013-01-17 DIAGNOSIS — C50919 Malignant neoplasm of unspecified site of unspecified female breast: Secondary | ICD-10-CM

## 2013-01-17 DIAGNOSIS — C50419 Malignant neoplasm of upper-outer quadrant of unspecified female breast: Secondary | ICD-10-CM

## 2013-01-17 DIAGNOSIS — Z170 Estrogen receptor positive status [ER+]: Secondary | ICD-10-CM

## 2013-01-17 DIAGNOSIS — C50911 Malignant neoplasm of unspecified site of right female breast: Secondary | ICD-10-CM

## 2013-01-17 DIAGNOSIS — Z5111 Encounter for antineoplastic chemotherapy: Secondary | ICD-10-CM

## 2013-01-17 LAB — CBC WITH DIFFERENTIAL/PLATELET
Basophils Absolute: 0 10*3/uL (ref 0.0–0.1)
Eosinophils Absolute: 0.2 10*3/uL (ref 0.0–0.5)
HCT: 39.4 % (ref 34.8–46.6)
HGB: 13 g/dL (ref 11.6–15.9)
LYMPH%: 20.7 % (ref 14.0–49.7)
MONO#: 0.3 10*3/uL (ref 0.1–0.9)
NEUT%: 66.6 % (ref 38.4–76.8)
Platelets: 298 10*3/uL (ref 145–400)
WBC: 4.1 10*3/uL (ref 3.9–10.3)
lymph#: 0.9 10*3/uL (ref 0.9–3.3)

## 2013-01-17 LAB — COMPREHENSIVE METABOLIC PANEL (CC13)
ALT: 40 U/L (ref 0–55)
Anion Gap: 12 mEq/L — ABNORMAL HIGH (ref 3–11)
BUN: 9.9 mg/dL (ref 7.0–26.0)
CO2: 26 mEq/L (ref 22–29)
Calcium: 9.9 mg/dL (ref 8.4–10.4)
Chloride: 103 mEq/L (ref 98–109)
Creatinine: 0.7 mg/dL (ref 0.6–1.1)
Glucose: 109 mg/dl (ref 70–140)
Potassium: 3.6 mEq/L (ref 3.5–5.1)
Total Bilirubin: 0.3 mg/dL (ref 0.20–1.20)

## 2013-01-17 MED ORDER — PACLITAXEL PROTEIN-BOUND CHEMO INJECTION 100 MG
100.0000 mg/m2 | Freq: Once | INTRAVENOUS | Status: AC
  Administered 2013-01-17: 200 mg via INTRAVENOUS
  Filled 2013-01-17: qty 40

## 2013-01-17 MED ORDER — DEXAMETHASONE SODIUM PHOSPHATE 10 MG/ML IJ SOLN
10.0000 mg | Freq: Once | INTRAMUSCULAR | Status: AC
  Administered 2013-01-17: 10 mg via INTRAVENOUS

## 2013-01-17 MED ORDER — SODIUM CHLORIDE 0.9 % IJ SOLN
10.0000 mL | INTRAMUSCULAR | Status: DC | PRN
  Administered 2013-01-17: 10 mL
  Filled 2013-01-17: qty 10

## 2013-01-17 MED ORDER — HEPARIN SOD (PORK) LOCK FLUSH 100 UNIT/ML IV SOLN
500.0000 [IU] | Freq: Once | INTRAVENOUS | Status: AC | PRN
  Administered 2013-01-17: 500 [IU]
  Filled 2013-01-17: qty 5

## 2013-01-17 MED ORDER — ONDANSETRON 8 MG/50ML IVPB (CHCC)
8.0000 mg | Freq: Once | INTRAVENOUS | Status: AC
  Administered 2013-01-17: 8 mg via INTRAVENOUS

## 2013-01-17 MED ORDER — DEXAMETHASONE SODIUM PHOSPHATE 10 MG/ML IJ SOLN
INTRAMUSCULAR | Status: AC
  Filled 2013-01-17: qty 1

## 2013-01-17 MED ORDER — ONDANSETRON 8 MG/NS 50 ML IVPB
INTRAVENOUS | Status: AC
  Filled 2013-01-17: qty 8

## 2013-01-17 MED ORDER — SODIUM CHLORIDE 0.9 % IV SOLN
Freq: Once | INTRAVENOUS | Status: AC
  Administered 2013-01-17: 14:00:00 via INTRAVENOUS

## 2013-01-17 NOTE — Progress Notes (Signed)
[facility]  Telephone:(336) [phone] Fax:(336) [phone]  OFFICE PROGRESS NOTE  ID: Elizabeth Miles   DOB: 12-Feb-1966  MR#: [id number]  GMW#:[id number]   PCP: Maximiano Coss, MD [place] PCP:  Georgiann Mccoy, MD   SU:  Emelia Loron, MD RAD ONC:   Lurline Hare, MD  DIAGNOSIS:  Elizabeth Miles is a 47 y.o.  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  STAGE:  Right breast invasive ductal carcinoma Stage II, T2 Nx Mx  ER +, PR -, Her2/Neu -, Ki-67 high   PRIOR THERAPY: #1 Normal mammogram in 07/2011. On physical exam however the possibility of a cyst was noted in the right breast. In 07/2012 patient noted on exam another lump in the right breast. She underwent a diagnostic mammogram on 08/06/2012 that showed a right breast nodule in the outer quadrant. She had an ultrasound performed that showed at the 9:30 o'clock position a 3.6 cm area, and a 10:00 position a 1.3 cm area with a total area measuring between 5-6 cm.  Right breast needle core biopsy on 08/06/2012  performed in [place], [place] showed pathology  that revealed an intermediate grade invasive ductal carcinoma. (This is been confirmed by our pathology as well).  The tumor was estrogen receptor positive strongly (100%), progesterone receptor negative,  HER-2/neu negative, with a Ki-67 that showed a high proliferation rate.  #2 Elizabeth Miles  was seen by Dr. Welton Flakes on 08/22/2012 to discuss treatment options. Due to the size of the tumor Dr. Welton Flakes recommended that she proceed with neoadjuvant treatment initially consisting of chemotherapy. However due to the workup being incomplete I did recommend that she undergo MRI of the breasts as well as staging studies.   #3 Bilateral breast MRI on 08/27/2012 revealed in the right upper quadrant irregular lobulated mass with a satellite nodule or lobulation within 2 mm at its superior aspect, measured together as 4.5 x  4.0 x 3.8 cm.  There was extension of enhancement to the nipple, suggesting nipple involvement may be present.  No lymphadenopathy was noted there was no any other area of abnormal enhancement in either breast (clinical stage IIA, T2 N0).  #4 PET scan performed on 08/29/2012 revealed the primary breast cancer measuring 3.2 x 3.3 cm with SUV of 16. It was adjacent nodule along the superiomedial border of the primary mass measuring 1.4 cm which was also hypermetabolic. There were no additional areas of abnormal hypermetabolism.  No abnormal hypermetabolic activity was seen in the chest, abdomen/pelvis,within the liver, pancreas, adrenal glands or spleen. No hypermetabolic lymph nodes. In the skeleton, no focal hypermetabolic activity to suggest skeletal metastasis was seen.  #5 Patient also seen by Dr. Emelia Loron and Dr. Lurline Hare in consultation. Dr. Dwain Sarna  place the Port-A-Cath on 09/12/2012.  Patient attended a chemotherapy teaching class.   #6 Completed neoadjuvant chemotherapy consisting of Q14 day Adriamycin/Cytoxan x 4 cycles on 10/25/2012.  #7 Received one dose of neoadjuvant chemotherapy with Taxol on 11/08/2012.  She developed significant grade 2 neuropathy and Taxol was discontinued.  #8 Started on 11/15/2012 with neoadjuvant chemotherapy consisting of single agent Abraxane given on day 1, 8, 15 of each 28 day cycle.    CURRENT THERAPY:  Single agent Abraxane given on day 1, 8, 15 of each 28 day cycle with 4 cycles planned.   INTERVAL HISTORY: Elizabeth Miles is a  47 y.o. female who returns for evaluation prior to cycle 3  day 8 of abraxane therapy.  She had a 5 minute episode of epistaxis last night.  She had a stream of blood in the floor.  She was evaluated in the emergency room and her blood counts were stable.  She has saline nasal spray, but hasn't started using it.  Otherwise, she denies fevers, chills, nausea, vomiting, constipation, diarrhea, or any other concerns.     MEDICAL HISTORY: Past Medical History  Diagnosis Date  . 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 GERD (gastroesophageal reflux disease)   . GERD (gastroesophageal reflux disease) 08/22/2012  . Allergy   . Complication of anesthesia     1986 ; problem waking up  . Anxiety     ALLERGIES:   Allergies  Allergen Reactions  . Darvocet [Propoxyphene-Acetaminophen] Shortness Of Breath and Swelling  . Shellfish Allergy Shortness Of Breath and Swelling  . Other     CT dye, "chest hurt" "makes me feel cold"     MEDICATIONS:  Current Outpatient Prescriptions  Medication Sig Dispense Refill  . acetaminophen (TYLENOL) 325 MG tablet Take 650 mg by mouth every 4 (four) hours as needed for pain.      Marland Kitchen Dexlansoprazole (DEXILANT) 30 MG capsule Take 1 capsule (30 mg total) by mouth daily.  90 capsule  4  . lidocaine-prilocaine (EMLA) cream Apply topically as needed.  30 g  7  . ciprofloxacin (CIPRO) 500 MG tablet Take 1 tablet (500 mg total) by mouth 2 (two) times daily.  14 tablet  0  . hyaluronate sodium (RADIAPLEXRX) GEL Apply 1 application topically once. Apply after rad txs and bedtime,prn      . ibuprofen (ADVIL,MOTRIN) 200 MG tablet Take 200 mg by mouth every 4 (four) hours as needed for pain.      Marland Kitchen ondansetron (ZOFRAN) 8 MG tablet Take 8 mg by mouth every 8 (eight) hours as needed.       . triamcinolone cream (KENALOG) 0.1 % Apply topically 2 (two) times daily.  30 g  0   No current facility-administered medications for this visit.    SURGICAL HISTORY:  Past Surgical History  Procedure Laterality Date  . Dilation and curettage of uterus  1993  . Cervical ablation  1983  . Breast biopsy Right 08/06/12  . Popliteal synovial cyst excision  1970  . Portacath placement Left 09/12/2012    Procedure: INSERTION PORT-A-CATH;  Surgeon: Emelia Loron, MD;  Location: [facility] OR;  Service: General;  Laterality: Left;    REVIEW OF SYSTEMS:  A 10 point review of systems was  completed and is negative except as noted above.    PHYSICAL EXAMINATION: Blood pressure 122/80, pulse 100, temperature 97.9 F (36.6 C), temperature source Oral, resp. rate 18, height 5\' 7"  (1.702 m), weight 200 lb 6.4 oz (90.901 kg). Body mass index is 31.38 kg/(m^2). General: Patient is a well appearing female in no acute distress HEENT: PERRLA, sclerae anicteric no conjunctival pallor, MMM Neck: supple, no palpable adenopathy Lungs: clear to auscultation bilaterally, no wheezes, rhonchi, or rales Cardiovascular: regular rate rhythm, S1, S2, no murmurs, rubs or gallops Abdomen: Soft, non-tender, non-distended, normoactive bowel sounds, no HSM Extremities: warm and well perfused, no clubbing, cyanosis, or edema Skin: No rashes or lesions Neuro: Non-focal Breasts: difficulty palpating right breast mass. ECOG PERFORMANCE STATUS: 1 - Symptomatic but completely ambulatory     LABORATORY DATA: Lab Results  Component Value Date   WBC 4.1 01/17/2013   HGB 13.0 01/17/2013  HCT 39.4 01/17/2013   MCV 92.1 01/17/2013   PLT 298 01/17/2013      Chemistry      Component Value Date/Time   NA 141 01/10/2013 1312   K 4.5 01/10/2013 1312   CO2 27 01/10/2013 1312   BUN 10.8 01/10/2013 1312   CREATININE 1.0 01/10/2013 1312      Component Value Date/Time   CALCIUM 9.9 01/10/2013 1312   ALKPHOS 60 01/10/2013 1312   AST 23 01/10/2013 1312   ALT 27 01/10/2013 1312   BILITOT 0.38 01/10/2013 1312       RADIOGRAPHIC STUDIES: No results found.   ASSESSMENT: Elizabeth Miles is a 47 y.o. female diagnosed by biopsy with stage II, invasive ductal carcinoma of the right breast in 07/2012:  #1 Completed neoadjuvant chemotherapy consisting of Q14 day Adriamycin/Cytoxan x 4 cycles on 10/25/2012.  #2 Received one dose of neoadjuvant chemotherapy with Taxol on 11/08/2012.  She developed significant grade 2 neuropathy and Taxol was discontinued.  #3 Started on 11/15/2012 with neoadjuvant  chemotherapy consisting of single agent Abraxane given on day 1, 8, 15 of each 28 day cycle with 4 cycles planned.   #4 Chipped tooth was evaluated by her dentist, and tooth is stable enough to continue chemotherapy.  She will receive a crown once chemotherapy is complete.    PLAN:  #1 Doing well.  Her labs are stable.  She will use saline nasal spray two to three times per day.  She will also get afrin and use only if needed should she have another episode.   #2 She will proceed with treatment today.    #3  She will return to clinic in one week for labs, evaluation, and Abraxane.    All questions answered.  Ms. Laux and her family/friend were encouraged to contact us in the interim with any questions, concerns, or problems.  I spent 25 minutes counseling the patient face to face.  The total time spent in the appointment was 30 minutes.   Illa Level, NP Medical Oncology [facility] [phone] 01/17/2013    1:17 PM

## 2013-01-20 ENCOUNTER — Telehealth: Payer: Self-pay | Admitting: Oncology

## 2013-01-20 NOTE — Telephone Encounter (Signed)
, °

## 2013-01-24 ENCOUNTER — Ambulatory Visit (HOSPITAL_BASED_OUTPATIENT_CLINIC_OR_DEPARTMENT_OTHER): Payer: BC Managed Care – PPO | Admitting: Adult Health

## 2013-01-24 ENCOUNTER — Other Ambulatory Visit (HOSPITAL_BASED_OUTPATIENT_CLINIC_OR_DEPARTMENT_OTHER): Payer: BC Managed Care – PPO

## 2013-01-24 ENCOUNTER — Ambulatory Visit (HOSPITAL_BASED_OUTPATIENT_CLINIC_OR_DEPARTMENT_OTHER): Payer: BC Managed Care – PPO

## 2013-01-24 ENCOUNTER — Telehealth: Payer: Self-pay | Admitting: Genetic Counselor

## 2013-01-24 ENCOUNTER — Telehealth: Payer: Self-pay | Admitting: *Deleted

## 2013-01-24 ENCOUNTER — Encounter: Payer: Self-pay | Admitting: Adult Health

## 2013-01-24 ENCOUNTER — Encounter: Payer: Self-pay | Admitting: Genetic Counselor

## 2013-01-24 VITALS — BP 136/87 | HR 108 | Temp 98.0°F | Resp 18 | Ht 67.0 in | Wt 202.4 lb

## 2013-01-24 DIAGNOSIS — C50419 Malignant neoplasm of upper-outer quadrant of unspecified female breast: Secondary | ICD-10-CM

## 2013-01-24 DIAGNOSIS — C50911 Malignant neoplasm of unspecified site of right female breast: Secondary | ICD-10-CM

## 2013-01-24 DIAGNOSIS — C50919 Malignant neoplasm of unspecified site of unspecified female breast: Secondary | ICD-10-CM

## 2013-01-24 DIAGNOSIS — Z170 Estrogen receptor positive status [ER+]: Secondary | ICD-10-CM

## 2013-01-24 DIAGNOSIS — Z5111 Encounter for antineoplastic chemotherapy: Secondary | ICD-10-CM

## 2013-01-24 LAB — CBC WITH DIFFERENTIAL/PLATELET
BASO%: 1.8 % (ref 0.0–2.0)
Basophils Absolute: 0.1 10*3/uL (ref 0.0–0.1)
Eosinophils Absolute: 0.2 10*3/uL (ref 0.0–0.5)
HCT: 38.3 % (ref 34.8–46.6)
LYMPH%: 24.7 % (ref 14.0–49.7)
MCHC: 33.3 g/dL (ref 31.5–36.0)
MONO#: 0.3 10*3/uL (ref 0.1–0.9)
MONO%: 7.7 % (ref 0.0–14.0)
NEUT#: 2 10*3/uL (ref 1.5–6.5)
Platelets: 297 10*3/uL (ref 145–400)
RBC: 4.12 10*6/uL (ref 3.70–5.45)
WBC: 3.4 10*3/uL — ABNORMAL LOW (ref 3.9–10.3)
lymph#: 0.8 10*3/uL — ABNORMAL LOW (ref 0.9–3.3)

## 2013-01-24 LAB — COMPREHENSIVE METABOLIC PANEL (CC13)
ALT: 34 U/L (ref 0–55)
Albumin: 4 g/dL (ref 3.5–5.0)
Alkaline Phosphatase: 66 U/L (ref 40–150)
Anion Gap: 11 mEq/L (ref 3–11)
CO2: 24 mEq/L (ref 22–29)
Calcium: 9.4 mg/dL (ref 8.4–10.4)
Chloride: 107 mEq/L (ref 98–109)
Creatinine: 0.8 mg/dL (ref 0.6–1.1)
Glucose: 79 mg/dl (ref 70–140)
Sodium: 142 mEq/L (ref 136–145)
Total Protein: 7.3 g/dL (ref 6.4–8.3)

## 2013-01-24 MED ORDER — DEXAMETHASONE SODIUM PHOSPHATE 10 MG/ML IJ SOLN
10.0000 mg | Freq: Once | INTRAMUSCULAR | Status: AC
  Administered 2013-01-24: 10 mg via INTRAVENOUS

## 2013-01-24 MED ORDER — SODIUM CHLORIDE 0.9 % IJ SOLN
10.0000 mL | INTRAMUSCULAR | Status: DC | PRN
  Administered 2013-01-24: 10 mL
  Filled 2013-01-24: qty 10

## 2013-01-24 MED ORDER — HEPARIN SOD (PORK) LOCK FLUSH 100 UNIT/ML IV SOLN
500.0000 [IU] | Freq: Once | INTRAVENOUS | Status: AC | PRN
  Administered 2013-01-24: 500 [IU]
  Filled 2013-01-24: qty 5

## 2013-01-24 MED ORDER — PACLITAXEL PROTEIN-BOUND CHEMO INJECTION 100 MG
100.0000 mg/m2 | Freq: Once | INTRAVENOUS | Status: AC
  Administered 2013-01-24: 200 mg via INTRAVENOUS
  Filled 2013-01-24: qty 40

## 2013-01-24 MED ORDER — ONDANSETRON 8 MG/50ML IVPB (CHCC)
8.0000 mg | Freq: Once | INTRAVENOUS | Status: AC
  Administered 2013-01-24: 8 mg via INTRAVENOUS

## 2013-01-24 MED ORDER — ONDANSETRON 8 MG/NS 50 ML IVPB
INTRAVENOUS | Status: AC
  Filled 2013-01-24: qty 8

## 2013-01-24 MED ORDER — DEXAMETHASONE SODIUM PHOSPHATE 10 MG/ML IJ SOLN
INTRAMUSCULAR | Status: AC
  Filled 2013-01-24: qty 1

## 2013-01-24 MED ORDER — SODIUM CHLORIDE 0.9 % IV SOLN
Freq: Once | INTRAVENOUS | Status: AC
  Administered 2013-01-24: 15:00:00 via INTRAVENOUS

## 2013-01-24 NOTE — Telephone Encounter (Signed)
Per staff message I have adjusted 12/19 apapt

## 2013-01-24 NOTE — Telephone Encounter (Signed)
Revealed negative genetic testing on Breast/Ovarian cancer panel 

## 2013-01-24 NOTE — Progress Notes (Signed)
[facility]  Telephone:([phone] Fax:(336) [phone]  OFFICE PROGRESS NOTE  ID: Elizabeth Miles   DOB: 01/30/1966  MR#: [id number]  JYN#:[id number]   PCP: Maximiano Coss, MD [place] PCP:  Georgiann Mccoy, MD   SU:  Emelia Loron, MD RAD ONC:   Lurline Hare, MD  DIAGNOSIS:  Elizabeth Miles is a 47 y.o.  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  STAGE:  Right breast invasive ductal carcinoma Stage II, T2 Nx Mx  ER +, PR -, Her2/Neu -, Ki-67 high   PRIOR THERAPY: #1 Normal mammogram in 07/2011. On physical exam however the possibility of a cyst was noted in the right breast. In 07/2012 patient noted on exam another lump in the right breast. She underwent a diagnostic mammogram on 08/06/2012 that showed a right breast nodule in the outer quadrant. She had an ultrasound performed that showed at the 9:30 o'clock position a 3.6 cm area, and a 10:00 position a 1.3 cm area with a total area measuring between 5-6 cm.  Right breast needle core biopsy on 08/06/2012  performed in [place], [place] showed pathology  that revealed an intermediate grade invasive ductal carcinoma. (This is been confirmed by our pathology as well).  The tumor was estrogen receptor positive strongly (100%), progesterone receptor negative,  HER-2/neu negative, with a Ki-67 that showed a high proliferation rate.  #2 Elizabeth Miles  was seen by Dr. Welton Flakes on 08/22/2012 to discuss treatment options. Due to the size of the tumor Dr. Welton Flakes recommended that she proceed with neoadjuvant treatment initially consisting of chemotherapy. However due to the workup being incomplete I did recommend that she undergo MRI of the breasts as well as staging studies.   #3 Bilateral breast MRI on 08/27/2012 revealed in the right upper quadrant irregular lobulated mass with a satellite nodule or lobulation within 2 mm at its superior aspect, measured together as 4.5 x  4.0 x 3.8 cm.  There was extension of enhancement to the nipple, suggesting nipple involvement may be present.  No lymphadenopathy was noted there was no any other area of abnormal enhancement in either breast (clinical stage IIA, T2 N0).  #4 PET scan performed on 08/29/2012 revealed the primary breast cancer measuring 3.2 x 3.3 cm with SUV of 16. It was adjacent nodule along the superiomedial border of the primary mass measuring 1.4 cm which was also hypermetabolic. There were no additional areas of abnormal hypermetabolism.  No abnormal hypermetabolic activity was seen in the chest, abdomen/pelvis,within the liver, pancreas, adrenal glands or spleen. No hypermetabolic lymph nodes. In the skeleton, no focal hypermetabolic activity to suggest skeletal metastasis was seen.  #5 Patient also seen by Dr. Emelia Loron and Dr. Lurline Hare in consultation. Dr. Dwain Sarna  place the Port-A-Cath on 09/12/2012.  Patient attended a chemotherapy teaching class.   #6 Completed neoadjuvant chemotherapy consisting of Q14 day Adriamycin/Cytoxan x 4 cycles on 10/25/2012.  #7 Received one dose of neoadjuvant chemotherapy with Taxol on 11/08/2012.  She developed significant grade 2 neuropathy and Taxol was discontinued.  #8 Started on 11/15/2012 with neoadjuvant chemotherapy consisting of single agent Abraxane given on day 1, 8, 15 of each 28 day cycle.    CURRENT THERAPY:  Single agent Abraxane given on day 1, 8, 15 of each 28 day cycle with 4 cycles planned.   INTERVAL HISTORY: Elizabeth Miles is a  47 y.o. female who returns for evaluation prior to cycle 3  day 15 of abraxane therapy.  She is doing well today.  She denies fevers, chills, nausea, vomiting, constipation, diarrhea, numbness, or any other concerns.  A 10 point ROS is neg.  She is happy b/c she just found out her genetic testing was negative.   MEDICAL HISTORY: Past Medical History  Diagnosis Date  . Breast cancer 08/06/12    invasive ductal  carcioma  . GERD (gastroesophageal reflux disease)   . GERD (gastroesophageal reflux disease) 08/22/2012  . Allergy   . Complication of anesthesia     1986 ; problem waking up  . Anxiety     ALLERGIES:   Allergies  Allergen Reactions  . Darvocet [Propoxyphene-Acetaminophen] Shortness Of Breath and Swelling  . Shellfish Allergy Shortness Of Breath and Swelling  . Other     CT dye, "chest hurt" "makes me feel cold"     MEDICATIONS:  Current Outpatient Prescriptions  Medication Sig Dispense Refill  . acetaminophen (TYLENOL) 325 MG tablet Take 650 mg by mouth every 4 (four) hours as needed for pain.      Marland Kitchen Dexlansoprazole (DEXILANT) 30 MG capsule Take 1 capsule (30 mg total) by mouth daily.  90 capsule  4  . lidocaine-prilocaine (EMLA) cream Apply topically as needed.  30 g  7  . hyaluronate sodium (RADIAPLEXRX) GEL Apply 1 application topically once. Apply after rad txs and bedtime,prn      . ibuprofen (ADVIL,MOTRIN) 200 MG tablet Take 200 mg by mouth every 4 (four) hours as needed for pain.      Marland Kitchen ondansetron (ZOFRAN) 8 MG tablet Take 8 mg by mouth every 8 (eight) hours as needed.        No current facility-administered medications for this visit.    SURGICAL HISTORY:  Past Surgical History  Procedure Laterality Date  . Dilation and curettage of uterus  1993  . Cervical ablation  1983  . Breast biopsy Right 08/06/12  . Popliteal synovial cyst excision  1970  . Portacath placement Left 09/12/2012    Procedure: INSERTION PORT-A-CATH;  Surgeon: Emelia Loron, MD;  Location[facility];  Service: General;  Laterality: Left;    REVIEW OF SYSTEMS:  A 10 point review of systems was completed and is negative except as noted above.    PHYSICAL EXAMINATION: Blood pressure 136/87, pulse 108, temperature 98 F (36.7 C), temperature source Oral, resp. rate 18, height 5\' 7"  (1.702 m), weight 202 lb 6.4 oz (91.808 kg). Body mass index is 31.69 kg/(m^2). General: Patient is a well  appearing female in no acute distress HEENT: PERRLA, sclerae anicteric no conjunctival pallor, MMM Neck: supple, no palpable adenopathy Lungs: clear to auscultation bilaterally, no wheezes, rhonchi, or rales Cardiovascular: regular rate rhythm, S1, S2, no murmurs, rubs or gallops Abdomen: Soft, non-tender, non-distended, normoactive bowel sounds, no HSM Extremities: warm and well perfused, no clubbing, cyanosis, or edema Skin: No rashes or lesions Neuro: Non-focal Breasts: difficulty palpating right breast mass. ECOG PERFORMANCE STATUS: 1 - Symptomatic but completely ambulatory     LABORATORY DATA: Lab Results  Component Value Date   WBC 3.4* 01/24/2013   HGB 12.8 01/24/2013   HCT 38.3 01/24/2013   MCV 93.0 01/24/2013   PLT 297 01/24/2013      Chemistry      Component Value Date/Time   NA 142 01/24/2013 1249   K 3.9 01/24/2013 1249   CO2 24 01/24/2013 1249   BUN 13.0 01/24/2013 1249   CREATININE 0.8 01/24/2013 1249  Component Value Date/Time   CALCIUM 9.4 01/24/2013 1249   ALKPHOS 66 01/24/2013 1249   AST 24 01/24/2013 1249   ALT 34 01/24/2013 1249   BILITOT 0.29 01/24/2013 1249       RADIOGRAPHIC STUDIES: No results found.   ASSESSMENT: Elizabeth Miles is a 47 y.o. female diagnosed by biopsy with stage II, invasive ductal carcinoma of the right breast in 07/2012:  #1 Completed neoadjuvant chemotherapy consisting of Q14 day Adriamycin/Cytoxan x 4 cycles on 10/25/2012.  #2 Received one dose of neoadjuvant chemotherapy with Taxol on 11/08/2012.  She developed significant grade 2 neuropathy and Taxol was discontinued.  #3 Started on 11/15/2012 with neoadjuvant chemotherapy consisting of single agent Abraxane given on day 1, 8, 15 of each 28 day cycle with 4 cycles planned.   #4 Chipped tooth was evaluated by her dentist, and tooth is stable enough to continue chemotherapy.  She will receive a crown once chemotherapy is complete.    PLAN:  #1 Patient is doing  well today.  She will proceed with chemotherapy.  I reviewed her CBC with her in detail.  It is stable.    #2  She will return next week for labs and evaluation.  She has MRI scheduled for 01/30/13.    All questions answered.  Elizabeth Miles and her family/friend were encouraged to contact us in the interim with any questions, concerns, or problems.  I spent 15 minutes counseling the patient face to face.  The total time spent in the appointment was 30 minutes.  Illa Level, NP Medical Oncology [facility] [phone] 01/26/2013    9:02 AM

## 2013-01-28 ENCOUNTER — Telehealth: Payer: Self-pay | Admitting: Oncology

## 2013-01-30 ENCOUNTER — Ambulatory Visit
Admission: RE | Admit: 2013-01-30 | Discharge: 2013-01-30 | Disposition: A | Discharge status: Home / Self Care | Payer: BC Managed Care – PPO | Source: Ambulatory Visit | Attending: Adult Health | Admitting: Adult Health

## 2013-01-30 DIAGNOSIS — C50919 Malignant neoplasm of unspecified site of unspecified female breast: Secondary | ICD-10-CM

## 2013-01-30 MED ORDER — GADOBENATE DIMEGLUMINE 529 MG/ML IV SOLN
18.0000 mL | Freq: Once | INTRAVENOUS | Status: AC | PRN
  Administered 2013-01-30: 18 mL via INTRAVENOUS

## 2013-01-31 ENCOUNTER — Telehealth: Payer: Self-pay | Admitting: Oncology

## 2013-01-31 ENCOUNTER — Ambulatory Visit (HOSPITAL_BASED_OUTPATIENT_CLINIC_OR_DEPARTMENT_OTHER): Payer: BC Managed Care – PPO | Admitting: Adult Health

## 2013-01-31 ENCOUNTER — Encounter: Payer: Self-pay | Admitting: Adult Health

## 2013-01-31 ENCOUNTER — Other Ambulatory Visit (HOSPITAL_BASED_OUTPATIENT_CLINIC_OR_DEPARTMENT_OTHER): Payer: BC Managed Care – PPO

## 2013-01-31 VITALS — BP 128/87 | HR 96 | Temp 98.3°F | Resp 18 | Ht 67.0 in | Wt 204.1 lb

## 2013-01-31 DIAGNOSIS — C50419 Malignant neoplasm of upper-outer quadrant of unspecified female breast: Secondary | ICD-10-CM

## 2013-01-31 DIAGNOSIS — C50919 Malignant neoplasm of unspecified site of unspecified female breast: Secondary | ICD-10-CM

## 2013-01-31 DIAGNOSIS — C50911 Malignant neoplasm of unspecified site of right female breast: Secondary | ICD-10-CM

## 2013-01-31 DIAGNOSIS — Z170 Estrogen receptor positive status [ER+]: Secondary | ICD-10-CM

## 2013-01-31 LAB — CBC WITH DIFFERENTIAL/PLATELET
BASO%: 1.4 % (ref 0.0–2.0)
Basophils Absolute: 0.1 10*3/uL (ref 0.0–0.1)
HCT: 36.9 % (ref 34.8–46.6)
HGB: 12.7 g/dL (ref 11.6–15.9)
MCH: 31.3 pg (ref 25.1–34.0)
MCHC: 34.3 g/dL (ref 31.5–36.0)
MONO#: 0.3 10*3/uL (ref 0.1–0.9)
NEUT#: 2.4 10*3/uL (ref 1.5–6.5)
NEUT%: 63.8 % (ref 38.4–76.8)
RDW: 14.4 % (ref 11.2–14.5)
WBC: 3.7 10*3/uL — ABNORMAL LOW (ref 3.9–10.3)
lymph#: 0.8 10*3/uL — ABNORMAL LOW (ref 0.9–3.3)

## 2013-01-31 LAB — COMPREHENSIVE METABOLIC PANEL (CC13)
ALT: 27 U/L (ref 0–55)
Anion Gap: 9 mEq/L (ref 3–11)
CO2: 27 mEq/L (ref 22–29)
Calcium: 9.5 mg/dL (ref 8.4–10.4)
Chloride: 107 mEq/L (ref 98–109)
Creatinine: 0.7 mg/dL (ref 0.6–1.1)
Total Protein: 7.1 g/dL (ref 6.4–8.3)

## 2013-01-31 NOTE — Progress Notes (Signed)
[facility]  Telephone:(336) [phone] Fax:(336) [phone]  OFFICE PROGRESS NOTE  ID: INGRIS PASQUARELLA   DOB: [birth year]  MR#: [id number]  MWU#:[id number]   PCP: Maximiano Coss, MD [place] PCP:  Georgiann Mccoy, MD   SU:  Emelia Loron, MD RAD ONC:   Lurline Hare, MD  DIAGNOSIS:  Elizabeth Miles is a 47 y.o.  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  STAGE:  Right breast invasive ductal carcinoma Stage II, T2 Nx Mx  ER +, PR -, Her2/Neu -, Ki-67 high   PRIOR THERAPY: #1 Normal mammogram in 07/2011. On physical exam however the possibility of a cyst was noted in the right breast. In 07/2012 patient noted on exam another lump in the right breast. She underwent a diagnostic mammogram on 08/06/2012 that showed a right breast nodule in the outer quadrant. She had an ultrasound performed that showed at the 9:30 o'clock position a 3.6 cm area, and a 10:00 position a 1.3 cm area with a total area measuring between 5-6 cm.  Right breast needle core biopsy on 08/06/2012  performed in [place], [place] showed pathology  that revealed an intermediate grade invasive ductal carcinoma. (This is been confirmed by our pathology as well).  The tumor was estrogen receptor positive strongly (100%), progesterone receptor negative,  HER-2/neu negative, with a Ki-67 that showed a high proliferation rate.  #2 Izola Price Kneip  was seen by Dr. Welton Flakes on 08/22/2012 to discuss treatment options. Due to the size of the tumor Dr. Welton Flakes recommended that she proceed with neoadjuvant treatment initially consisting of chemotherapy. However due to the workup being incomplete I did recommend that she undergo MRI of the breasts as well as staging studies.   #3 Bilateral breast MRI on 08/27/2012 revealed in the right upper quadrant irregular lobulated mass with a satellite nodule or lobulation within 2 mm at its superior aspect, measured together as 4.5 x  4.0 x 3.8 cm.  There was extension of enhancement to the nipple, suggesting nipple involvement may be present.  No lymphadenopathy was noted there was no any other area of abnormal enhancement in either breast (clinical stage IIA, T2 N0).  #4 PET scan performed on 08/29/2012 revealed the primary breast cancer measuring 3.2 x 3.3 cm with SUV of 16. It was adjacent nodule along the superiomedial border of the primary mass measuring 1.4 cm which was also hypermetabolic. There were no additional areas of abnormal hypermetabolism.  No abnormal hypermetabolic activity was seen in the chest, abdomen/pelvis,within the liver, pancreas, adrenal glands or spleen. No hypermetabolic lymph nodes. In the skeleton, no focal hypermetabolic activity to suggest skeletal metastasis was seen.  #5 Patient also seen by Dr. Emelia Loron and Dr. Lurline Hare in consultation. Dr. Dwain Sarna  place the Port-A-Cath on 09/12/2012.  Patient attended a chemotherapy teaching class.   #6 Completed neoadjuvant chemotherapy consisting of Q14 day Adriamycin/Cytoxan x 4 cycles on 10/25/2012.  #7 Received one dose of neoadjuvant chemotherapy with Taxol on 11/08/2012.  She developed significant grade 2 neuropathy and Taxol was discontinued.  #8 Started on 11/15/2012 with neoadjuvant chemotherapy consisting of single agent Abraxane given on day 1, 8, 15 of each 28 day cycle.    CURRENT THERAPY:  Single agent Abraxane given on day 1, 8, 15 of each 28 day cycle with 4 cycles planned.   INTERVAL HISTORY: [name] is a  47 y.o. female who returns for evaluation prior to cycle 3  day 21 of abraxane therapy.  She is doing well today.  She is fatigued.  She had an MRI breast to evaluate her response to neoadjuvant chemotherapy, and it has decreased a bout 50%, I reviewed her results with her.  Otherwise, she denies fevers, chills, nausea, vomiting, constipation, diarrhea, numbness, or any further concerns.     MEDICAL  HISTORY: Past Medical History  Diagnosis Date  . 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 GERD (gastroesophageal reflux disease)   . GERD (gastroesophageal reflux disease) 08/22/2012  . Allergy   . Complication of anesthesia     1986 ; problem waking up  . Anxiety     ALLERGIES:   Allergies  Allergen Reactions  . Darvocet [Propoxyphene-Acetaminophen] Shortness Of Breath and Swelling  . Shellfish Allergy Shortness Of Breath and Swelling  . Other     CT dye, "chest hurt" "makes me feel cold"     MEDICATIONS:  Current Outpatient Prescriptions  Medication Sig Dispense Refill  . acetaminophen (TYLENOL) 325 MG tablet Take 650 mg by mouth every 4 (four) hours as needed for pain.      Marland Kitchen Dexlansoprazole (DEXILANT) 30 MG capsule Take 1 capsule (30 mg total) by mouth daily.  90 capsule  4  . ibuprofen (ADVIL,MOTRIN) 200 MG tablet Take 200 mg by mouth every 4 (four) hours as needed for pain.      Marland Kitchen lidocaine-prilocaine (EMLA) cream Apply topically as needed.  30 g  7  . ondansetron (ZOFRAN) 8 MG tablet Take 8 mg by mouth every 8 (eight) hours as needed.       . hyaluronate sodium (RADIAPLEXRX) GEL Apply 1 application topically once. Apply after rad txs and bedtime,prn       No current facility-administered medications for this visit.    SURGICAL HISTORY:  Past Surgical History  Procedure Laterality Date  . Dilation and curettage of uterus  1993  . Cervical ablation  1983  . Breast biopsy Right 08/06/12  . Popliteal synovial cyst excision  1970  . Portacath placement Left 09/12/2012    Procedure: INSERTION PORT-A-CATH;  Surgeon: Emelia Loron, MD;  Location: [facility] OR;  Service: General;  Laterality: Left;    REVIEW OF SYSTEMS:  A 10 point review of systems was completed and is negative except as noted above.    PHYSICAL EXAMINATION: Blood pressure 128/87, pulse 96, temperature 98.3 F (36.8 C), temperature source Oral, resp. rate 18, height 5\' 7"  (1.702 m), weight 204  lb 1.6 oz (92.579 kg). Body mass index is 31.96 kg/(m^2). General: Patient is a well appearing female in no acute distress HEENT: PERRLA, sclerae anicteric no conjunctival pallor, MMM Neck: supple, no palpable adenopathy Lungs: clear to auscultation bilaterally, no wheezes, rhonchi, or rales Cardiovascular: regular rate rhythm, S1, S2, no murmurs, rubs or gallops Abdomen: Soft, non-tender, non-distended, normoactive bowel sounds, no HSM Extremities: warm and well perfused, no clubbing, cyanosis, or edema Skin: No rashes or lesions Neuro: Non-focal. ECOG PERFORMANCE STATUS: 1 - Symptomatic but completely ambulatory     LABORATORY DATA: Lab Results  Component Value Date   WBC 3.7* 01/31/2013   HGB 12.7 01/31/2013   HCT 36.9 01/31/2013   MCV 91.3 01/31/2013   PLT 292 01/31/2013      Chemistry      Component Value Date/Time   NA 142 01/31/2013 1321   K 4.3 01/31/2013 1321   CO2 27 01/31/2013 1321   BUN 11.0 01/31/2013 1321   CREATININE 0.7 01/31/2013  1321      Component Value Date/Time   CALCIUM 9.5 01/31/2013 1321   ALKPHOS 62 01/31/2013 1321   AST 19 01/31/2013 1321   ALT 27 01/31/2013 1321   BILITOT 0.23 01/31/2013 1321       RADIOGRAPHIC STUDIES: No results found.   ASSESSMENT: Elizabeth Miles is a 47 y.o. female diagnosed by biopsy with stage II, invasive ductal carcinoma of the right breast in 07/2012:  #1 Completed neoadjuvant chemotherapy consisting of Q14 day Adriamycin/Cytoxan x 4 cycles on 10/25/2012.  #2 Received one dose of neoadjuvant chemotherapy with Taxol on 11/08/2012.  She developed significant grade 2 neuropathy and Taxol was discontinued.  #3 Started on 11/15/2012 with neoadjuvant chemotherapy consisting of single agent Abraxane given on day 1, 8, 15 of each 28 day cycle with 4 cycles planned.   #4 Chipped tooth was evaluated by her dentist, and tooth is stable enough to continue chemotherapy.  She will receive a crown once chemotherapy is complete.    PLAN:   #1 Patient is doing well today.  I reviewed her CBC with her in detail.  It is stable.  We discussed her breast MRI results.    #2  She will return next week for labs and evaluation and treatment.  She has follow up with Dr. Dwain Sarna on 02/13/13.    All questions answered.  Ms. Maiden and her family/friend were encouraged to contact us in the interim with any questions, concerns, or problems.  I spent 25 minutes counseling the patient face to face.  The total time spent in the appointment was 30 minutes.  Illa Level, NP Medical Oncology [facility] [phone] 02/02/2013    8:09 AM

## 2013-02-07 ENCOUNTER — Ambulatory Visit (HOSPITAL_BASED_OUTPATIENT_CLINIC_OR_DEPARTMENT_OTHER): Payer: BC Managed Care – PPO | Admitting: Adult Health

## 2013-02-07 ENCOUNTER — Other Ambulatory Visit (HOSPITAL_BASED_OUTPATIENT_CLINIC_OR_DEPARTMENT_OTHER): Payer: BC Managed Care – PPO

## 2013-02-07 ENCOUNTER — Encounter: Payer: Self-pay | Admitting: Adult Health

## 2013-02-07 ENCOUNTER — Ambulatory Visit (HOSPITAL_BASED_OUTPATIENT_CLINIC_OR_DEPARTMENT_OTHER): Payer: BC Managed Care – PPO

## 2013-02-07 ENCOUNTER — Other Ambulatory Visit: Payer: Self-pay | Admitting: *Deleted

## 2013-02-07 ENCOUNTER — Encounter: Payer: Self-pay | Admitting: Oncology

## 2013-02-07 VITALS — BP 115/81 | HR 79 | Temp 98.0°F | Resp 18 | Ht 67.0 in | Wt 205.3 lb

## 2013-02-07 DIAGNOSIS — Z5111 Encounter for antineoplastic chemotherapy: Secondary | ICD-10-CM

## 2013-02-07 DIAGNOSIS — C50419 Malignant neoplasm of upper-outer quadrant of unspecified female breast: Secondary | ICD-10-CM

## 2013-02-07 DIAGNOSIS — C50919 Malignant neoplasm of unspecified site of unspecified female breast: Secondary | ICD-10-CM

## 2013-02-07 DIAGNOSIS — Z170 Estrogen receptor positive status [ER+]: Secondary | ICD-10-CM

## 2013-02-07 DIAGNOSIS — C50911 Malignant neoplasm of unspecified site of right female breast: Secondary | ICD-10-CM

## 2013-02-07 LAB — COMPREHENSIVE METABOLIC PANEL (CC13)
ALT: 30 U/L (ref 0–55)
AST: 23 U/L (ref 5–34)
Albumin: 4 g/dL (ref 3.5–5.0)
BUN: 11.8 mg/dL (ref 7.0–26.0)
CO2: 27 mEq/L (ref 22–29)
Calcium: 10.2 mg/dL (ref 8.4–10.4)
Chloride: 104 mEq/L (ref 98–109)
Potassium: 4.3 mEq/L (ref 3.5–5.1)
Sodium: 140 mEq/L (ref 136–145)

## 2013-02-07 LAB — CBC WITH DIFFERENTIAL/PLATELET
BASO%: 1 % (ref 0.0–2.0)
EOS%: 4.5 % (ref 0.0–7.0)
Eosinophils Absolute: 0.2 10*3/uL (ref 0.0–0.5)
HCT: 40.2 % (ref 34.8–46.6)
HGB: 13.2 g/dL (ref 11.6–15.9)
MCH: 29.9 pg (ref 25.1–34.0)
MCHC: 32.8 g/dL (ref 31.5–36.0)
NEUT#: 2.6 10*3/uL (ref 1.5–6.5)
NEUT%: 62.6 % (ref 38.4–76.8)
RBC: 4.42 10*6/uL (ref 3.70–5.45)
RDW: 13.6 % (ref 11.2–14.5)
WBC: 4.2 10*3/uL (ref 3.9–10.3)
lymph#: 0.9 10*3/uL (ref 0.9–3.3)

## 2013-02-07 MED ORDER — ONDANSETRON 8 MG/NS 50 ML IVPB
INTRAVENOUS | Status: AC
  Filled 2013-02-07: qty 8

## 2013-02-07 MED ORDER — SODIUM CHLORIDE 0.9 % IJ SOLN
10.0000 mL | INTRAMUSCULAR | Status: DC | PRN
  Administered 2013-02-07: 10 mL
  Filled 2013-02-07: qty 10

## 2013-02-07 MED ORDER — DEXAMETHASONE SODIUM PHOSPHATE 10 MG/ML IJ SOLN
INTRAMUSCULAR | Status: AC
  Filled 2013-02-07: qty 1

## 2013-02-07 MED ORDER — PACLITAXEL PROTEIN-BOUND CHEMO INJECTION 100 MG
100.0000 mg/m2 | Freq: Once | INTRAVENOUS | Status: AC
  Administered 2013-02-07: 200 mg via INTRAVENOUS
  Filled 2013-02-07: qty 40

## 2013-02-07 MED ORDER — HEPARIN SOD (PORK) LOCK FLUSH 100 UNIT/ML IV SOLN
500.0000 [IU] | Freq: Once | INTRAVENOUS | Status: AC | PRN
  Administered 2013-02-07: 500 [IU]
  Filled 2013-02-07: qty 5

## 2013-02-07 MED ORDER — DEXAMETHASONE SODIUM PHOSPHATE 10 MG/ML IJ SOLN
10.0000 mg | Freq: Once | INTRAMUSCULAR | Status: AC
  Administered 2013-02-07: 10 mg via INTRAVENOUS

## 2013-02-07 MED ORDER — SODIUM CHLORIDE 0.9 % IV SOLN
Freq: Once | INTRAVENOUS | Status: AC
  Administered 2013-02-07: 14:00:00 via INTRAVENOUS

## 2013-02-07 MED ORDER — ONDANSETRON 8 MG/50ML IVPB (CHCC)
8.0000 mg | Freq: Once | INTRAVENOUS | Status: AC
  Administered 2013-02-07: 8 mg via INTRAVENOUS

## 2013-02-07 NOTE — Patient Instructions (Signed)
Cancer Center Discharge Instructions for Patients Receiving Chemotherapy  Today you received the following chemotherapy agents Abraxane  To help prevent nausea and vomiting after your treatment, we encourage you to take your nausea medication as prescribed.   If you develop nausea and vomiting that is not controlled by your nausea medication, call the clinic.   BELOW ARE SYMPTOMS THAT SHOULD BE REPORTED IMMEDIATELY:  *FEVER GREATER THAN 100.5 F  *CHILLS WITH OR WITHOUT FEVER  NAUSEA AND VOMITING THAT IS NOT CONTROLLED WITH YOUR NAUSEA MEDICATION  *UNUSUAL SHORTNESS OF BREATH  *UNUSUAL BRUISING OR BLEEDING  TENDERNESS IN MOUTH AND THROAT WITH OR WITHOUT PRESENCE OF ULCERS  *URINARY PROBLEMS  *BOWEL PROBLEMS  UNUSUAL RASH Items with * indicate a potential emergency and should be followed up as soon as possible.  Feel free to call the clinic you have any questions or concerns. The clinic phone number is [phone].    

## 2013-02-07 NOTE — Progress Notes (Signed)
[facility]  Telephone:(336) [phone] [phone]) [phone]  OFFICE PROGRESS NOTE  ID: Elizabeth Miles   DOB: 26-Jan-1966  MR#: [id number]  GLO#:[id number]   PCP: Maximiano Coss, MD [place] PCP:  Georgiann Mccoy, MD   SU:  Emelia Loron, MD RAD ONC:   Lurline Hare, MD  DIAGNOSIS:  Elizabeth Miles is a 47 y.o.  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  STAGE:  Right breast invasive ductal carcinoma Stage II, T2 Nx Mx  ER +, PR -, Her2/Neu -, Ki-67 high   PRIOR THERAPY: #1 Normal mammogram in 07/2011. On physical exam however the possibility of a cyst was noted in the right breast. In 07/2012 patient noted on exam another lump in the right breast. She underwent a diagnostic mammogram on 08/06/2012 that showed a right breast nodule in the outer quadrant. She had an ultrasound performed that showed at the 9:30 o'clock position a 3.6 cm area, and a 10:00 position a 1.3 cm area with a total area measuring between 5-6 cm.  Right breast needle core biopsy on 08/06/2012  performed in [place], [place] showed pathology  that revealed an intermediate grade invasive ductal carcinoma. (This is been confirmed by our pathology as well).  The tumor was estrogen receptor positive strongly (100%), progesterone receptor negative,  HER-2/neu negative, with a Ki-67 that showed a high proliferation rate.  #2 Elizabeth Miles  was seen by Dr. Welton Flakes on 08/22/2012 to discuss treatment options. Due to the size of the tumor Dr. Welton Flakes recommended that she proceed with neoadjuvant treatment initially consisting of chemotherapy. However due to the workup being incomplete I did recommend that she undergo MRI of the breasts as well as staging studies.   #3 Bilateral breast MRI on 08/27/2012 revealed in the right upper quadrant irregular lobulated mass with a satellite nodule or lobulation within 2 mm at its superior aspect, measured together as 4.5 x  4.0 x 3.8 cm.  There was extension of enhancement to the nipple, suggesting nipple involvement may be present.  No lymphadenopathy was noted there was no any other area of abnormal enhancement in either breast (clinical stage IIA, T2 N0).  #4 PET scan performed on 08/29/2012 revealed the primary breast cancer measuring 3.2 x 3.3 cm with SUV of 16. It was adjacent nodule along the superiomedial border of the primary mass measuring 1.4 cm which was also hypermetabolic. There were no additional areas of abnormal hypermetabolism.  No abnormal hypermetabolic activity was seen in the chest, abdomen/pelvis,within the liver, pancreas, adrenal glands or spleen. No hypermetabolic lymph nodes. In the skeleton, no focal hypermetabolic activity to suggest skeletal metastasis was seen.  #5 Patient also seen by Dr. Emelia Loron and Dr. Lurline Hare in consultation. Dr. Dwain Sarna  place the Port-A-Cath on 09/12/2012.  Patient attended a chemotherapy teaching class.   #6 Completed neoadjuvant chemotherapy consisting of Q14 day Adriamycin/Cytoxan x 4 cycles on 10/25/2012.  #7 Received one dose of neoadjuvant chemotherapy with Taxol on 11/08/2012.  She developed significant grade 2 neuropathy and Taxol was discontinued.  #8 Started on 11/15/2012 with neoadjuvant chemotherapy consisting of single agent Abraxane given on day 1, 8, 15 of each 28 day cycle.    CURRENT THERAPY:  Single agent Abraxane given on day 1, 8, 15 of each 28 day cycle with 4 cycles planned.   INTERVAL HISTORY: Elizabeth Miles is a  47 y.o. female who returns for evaluation prior to Abraxane therapy.  She is very fatigued today.  She has a mole on her neck that is slightly reddened that she is concerned about.  Otherwise, she denies fevers, chills, nausea, vomiting, constipation, diarrhea, numbness, or further concerns.      MEDICAL HISTORY: Past Medical History  Diagnosis Date  . 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  GERD (gastroesophageal reflux disease)   . GERD (gastroesophageal reflux disease) 08/22/2012  . Allergy   . Complication of anesthesia     1986 ; problem waking up  . Anxiety     ALLERGIES:   Allergies  Allergen Reactions  . Darvocet [Propoxyphene-Acetaminophen] Shortness Of Breath and Swelling  . Shellfish Allergy Shortness Of Breath and Swelling  . Other     CT dye, "chest hurt" "makes me feel cold"     MEDICATIONS:  Current Outpatient Prescriptions  Medication Sig Dispense Refill  . acetaminophen (TYLENOL) 325 MG tablet Take 650 mg by mouth every 4 (four) hours as needed for pain.      Marland Kitchen Dexlansoprazole (DEXILANT) 30 MG capsule Take 1 capsule (30 mg total) by mouth daily.  90 capsule  4  . hyaluronate sodium (RADIAPLEXRX) GEL Apply 1 application topically once. Apply after rad txs and bedtime,prn      . ibuprofen (ADVIL,MOTRIN) 200 MG tablet Take 200 mg by mouth every 4 (four) hours as needed for pain.      Marland Kitchen lidocaine-prilocaine (EMLA) cream Apply topically as needed.  30 g  7  . ondansetron (ZOFRAN) 8 MG tablet Take 8 mg by mouth every 8 (eight) hours as needed.        No current facility-administered medications for this visit.    SURGICAL HISTORY:  Past Surgical History  Procedure Laterality Date  . Dilation and curettage of uterus  1993  . Cervical ablation  1983  . Breast biopsy Right 08/06/12  . Popliteal synovial cyst excision  1970  . Portacath placement Left 09/12/2012    Procedure: INSERTION PORT-A-CATH;  Surgeon: Emelia Loron, MD;  Location: [facility] OR;  Service: General;  Laterality: Left;    REVIEW OF SYSTEMS:  A 10 point review of systems was completed and is negative except as noted above.    PHYSICAL EXAMINATION: Blood pressure 115/81, pulse 79, temperature 98 F (36.7 C), temperature source Oral, resp. rate 18, height 5\' 7"  (1.702 m), weight 205 lb 4.8 oz (93.123 kg). Body mass index is 32.15 kg/(m^2). General: Patient is a well appearing female in no  acute distress HEENT: PERRLA, sclerae anicteric no conjunctival pallor, MMM Neck: supple, no palpable adenopathy Lungs: clear to auscultation bilaterally, no wheezes, rhonchi, or rales Cardiovascular: regular rate rhythm, S1, S2, no murmurs, rubs or gallops Abdomen: Soft, non-tender, non-distended, normoactive bowel sounds, no HSM Extremities: warm and well perfused, no clubbing, cyanosis, or edema Skin: No rashes or lesions, mole on right side of neck with minimal erythema Neuro: Non-focal. ECOG PERFORMANCE STATUS: 1 - Symptomatic but completely ambulatory     LABORATORY DATA: Lab Results  Component Value Date   WBC 4.2 02/07/2013   HGB 13.2 02/07/2013   HCT 40.2 02/07/2013   MCV 91.0 02/07/2013   PLT 279 02/07/2013      Chemistry      Component Value Date/Time   NA 140 02/07/2013 1241   K 4.3 02/07/2013 1241   CO2 27 02/07/2013 1241   BUN 11.8 02/07/2013 1241   CREATININE 0.8 02/07/2013 1241      Component Value Date/Time  CALCIUM 10.2 02/07/2013 1241   ALKPHOS 58 02/07/2013 1241   AST 23 02/07/2013 1241   ALT 30 02/07/2013 1241   BILITOT 0.41 02/07/2013 1241       RADIOGRAPHIC STUDIES: No results found.   ASSESSMENT: Elizabeth Miles is a 47 y.o. female diagnosed by biopsy with stage II, invasive ductal carcinoma of the right breast in 07/2012:  #1 Completed neoadjuvant chemotherapy consisting of Q14 day Adriamycin/Cytoxan x 4 cycles on 10/25/2012.  #2 Received one dose of neoadjuvant chemotherapy with Taxol on 11/08/2012.  She developed significant grade 2 neuropathy and Taxol was discontinued.  #3 Started on 11/15/2012 with neoadjuvant chemotherapy consisting of single agent Abraxane given on day 1, 8, 15 of each 28 day cycle with 4 cycles planned.   #4 Chipped tooth was evaluated by her dentist, and tooth is stable enough to continue chemotherapy.  She will receive a crown once chemotherapy is complete.    PLAN:  #1 Patient is doing well.  Her CBC is  stable.  She will proceed with chemotherapy today.    #2 I recommended she follow up with dermatology should her mole continue to bother her in a few days.    #  She will return next week for labs and evaluation and her final treatment.  She has follow up with Dr. Dwain Sarna on 02/13/13.    All questions answered.  Ms. Westergard and her family/friend were encouraged to contact us in the interim with any questions, concerns, or problems.  I spent 25 minutes counseling the patient face to face.  The total time spent in the appointment was 30 minutes.  Illa Level, NP Medical Oncology [facility] [phone] 02/09/2013    9:41 AM

## 2013-02-08 ENCOUNTER — Ambulatory Visit: Payer: BC Managed Care – PPO

## 2013-02-13 ENCOUNTER — Other Ambulatory Visit (HOSPITAL_BASED_OUTPATIENT_CLINIC_OR_DEPARTMENT_OTHER): Payer: BC Managed Care – PPO

## 2013-02-13 ENCOUNTER — Ambulatory Visit (HOSPITAL_BASED_OUTPATIENT_CLINIC_OR_DEPARTMENT_OTHER): Payer: BC Managed Care – PPO | Admitting: Adult Health

## 2013-02-13 ENCOUNTER — Telehealth (INDEPENDENT_AMBULATORY_CARE_PROVIDER_SITE_OTHER): Payer: Self-pay

## 2013-02-13 ENCOUNTER — Encounter: Payer: Self-pay | Admitting: Adult Health

## 2013-02-13 ENCOUNTER — Other Ambulatory Visit: Payer: Self-pay | Admitting: Emergency Medicine

## 2013-02-13 ENCOUNTER — Ambulatory Visit (INDEPENDENT_AMBULATORY_CARE_PROVIDER_SITE_OTHER): Payer: BC Managed Care – PPO | Admitting: General Surgery

## 2013-02-13 ENCOUNTER — Encounter (INDEPENDENT_AMBULATORY_CARE_PROVIDER_SITE_OTHER): Payer: Self-pay | Admitting: General Surgery

## 2013-02-13 VITALS — BP 128/76 | HR 68 | Temp 98.0°F | Resp 18 | Ht 67.0 in | Wt 206.0 lb

## 2013-02-13 VITALS — BP 129/87 | HR 103 | Temp 97.5°F | Resp 18 | Ht 67.0 in | Wt 207.0 lb

## 2013-02-13 DIAGNOSIS — C50911 Malignant neoplasm of unspecified site of right female breast: Secondary | ICD-10-CM

## 2013-02-13 DIAGNOSIS — C50919 Malignant neoplasm of unspecified site of unspecified female breast: Secondary | ICD-10-CM

## 2013-02-13 DIAGNOSIS — R609 Edema, unspecified: Secondary | ICD-10-CM

## 2013-02-13 LAB — COMPREHENSIVE METABOLIC PANEL (CC13)
AST: 17 U/L (ref 5–34)
Albumin: 3.9 g/dL (ref 3.5–5.0)
Alkaline Phosphatase: 63 U/L (ref 40–150)
BUN: 12.9 mg/dL (ref 7.0–26.0)
CO2: 27 mEq/L (ref 22–29)
Calcium: 9.4 mg/dL (ref 8.4–10.4)
Chloride: 105 mEq/L (ref 98–109)
Creatinine: 0.7 mg/dL (ref 0.6–1.1)
Potassium: 3.7 mEq/L (ref 3.5–5.1)
Sodium: 140 mEq/L (ref 136–145)
Total Bilirubin: 0.27 mg/dL (ref 0.20–1.20)

## 2013-02-13 LAB — CBC WITH DIFFERENTIAL/PLATELET
Basophils Absolute: 0 10*3/uL (ref 0.0–0.1)
EOS%: 6.2 % (ref 0.0–7.0)
LYMPH%: 20.6 % (ref 14.0–49.7)
MCH: 30 pg (ref 25.1–34.0)
MCV: 90.5 fL (ref 79.5–101.0)
MONO#: 0.2 10*3/uL (ref 0.1–0.9)
MONO%: 5.9 % (ref 0.0–14.0)
Platelets: 276 10*3/uL (ref 145–400)
RBC: 4.12 10*6/uL (ref 3.70–5.45)
RDW: 14.2 % (ref 11.2–14.5)

## 2013-02-13 NOTE — Progress Notes (Signed)
[facility]  Telephone:(336) [phone] Fax:(336) [phone]  OFFICE PROGRESS NOTE  ID: Elizabeth Miles   DOB: 28-Jun-1965  MR#: [id number]  WUX#:[id number]   PCP: Maximiano Coss, MD [place] PCP:  Georgiann Mccoy, MD   SU:  Emelia Loron, MD RAD ONC:   Lurline Hare, MD  DIAGNOSIS:  Elizabeth Miles is a 47 y.o.  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  STAGE:  Right breast invasive ductal carcinoma Stage II, T2 Nx Mx  ER +, PR -, Her2/Neu -, Ki-67 high   PRIOR THERAPY: #1 Normal mammogram in 07/2011. On physical exam however the possibility of a cyst was noted in the right breast. In 07/2012 patient noted on exam another lump in the right breast. She underwent a diagnostic mammogram on 08/06/2012 that showed a right breast nodule in the outer quadrant. She had an ultrasound performed that showed at the 9:30 o'clock position a 3.6 cm area, and a 10:00 position a 1.3 cm area with a total area measuring between 5-6 cm.  Right breast needle core biopsy on 08/06/2012  performed in [place], [place] showed pathology  that revealed an intermediate grade invasive ductal carcinoma. (This is been confirmed by our pathology as well).  The tumor was estrogen receptor positive strongly (100%), progesterone receptor negative,  HER-2/neu negative, with a Ki-67 that showed a high proliferation rate.  #2 Elizabeth Miles  was seen by Dr. Welton Flakes on 08/22/2012 to discuss treatment options. Due to the size of the tumor Dr. Welton Flakes recommended that she proceed with neoadjuvant treatment initially consisting of chemotherapy. However due to the workup being incomplete I did recommend that she undergo MRI of the breasts as well as staging studies.   #3 Bilateral breast MRI on 08/27/2012 revealed in the right upper quadrant irregular lobulated mass with a satellite nodule or lobulation within 2 mm at its superior aspect, measured together as 4.5 x  4.0 x 3.8 cm.  There was extension of enhancement to the nipple, suggesting nipple involvement may be present.  No lymphadenopathy was noted there was no any other area of abnormal enhancement in either breast (clinical stage IIA, T2 N0).  #4 PET scan performed on 08/29/2012 revealed the primary breast cancer measuring 3.2 x 3.3 cm with SUV of 16. It was adjacent nodule along the superiomedial border of the primary mass measuring 1.4 cm which was also hypermetabolic. There were no additional areas of abnormal hypermetabolism.  No abnormal hypermetabolic activity was seen in the chest, abdomen/pelvis,within the liver, pancreas, adrenal glands or spleen. No hypermetabolic lymph nodes. In the skeleton, no focal hypermetabolic activity to suggest skeletal metastasis was seen.  #5 Patient also seen by Dr. Emelia Loron and Dr. Lurline Hare in consultation. Dr. Dwain Sarna  place the Port-A-Cath on 09/12/2012.  Patient attended a chemotherapy teaching class.   #6 Completed neoadjuvant chemotherapy consisting of Q14 day Adriamycin/Cytoxan x 4 cycles on 10/25/2012.  #7 Received one dose of neoadjuvant chemotherapy with Taxol on 11/08/2012.  She developed significant grade 2 neuropathy and Taxol was discontinued.  #8 Started on 11/15/2012 with neoadjuvant chemotherapy consisting of single agent Abraxane given on day 1, 8, 15 of each 28 day cycle.   CURRENT THERAPY:  Single agent Abraxane given on day 1, 8, 15 of each 28 day cycle with 4 cycles planned.  INTERVAL HISTORY: Elizabeth Miles is a  47 y.o. female who returns for evaluation prior to her final Abraxane therapy.  She continues to experience fatigue today.  She is otherwise feeling well.  She has some mild swelling in her bilateral lower extremities.  Otherwise, she denies fevers, chills, nausea, vomiting, constipation, diarrhea, numbness, or any further concerns.       MEDICAL HISTORY: Past Medical History  Diagnosis Date  . Breast cancer  08/06/12    invasive ductal carcioma  . GERD (gastroesophageal reflux disease)   . GERD (gastroesophageal reflux disease) 08/22/2012  . Allergy   . Complication of anesthesia     1986 ; problem waking up  . Anxiety     ALLERGIES:   Allergies  Allergen Reactions  . Darvocet [Propoxyphene-Acetaminophen] Shortness Of Breath and Swelling  . Shellfish Allergy Shortness Of Breath and Swelling  . Other     CT dye, "chest hurt" "makes me feel cold"     MEDICATIONS:  Current Outpatient Prescriptions  Medication Sig Dispense Refill  . acetaminophen (TYLENOL) 325 MG tablet Take 650 mg by mouth every 4 (four) hours as needed for pain.      Marland Kitchen Dexlansoprazole (DEXILANT) 30 MG capsule Take 1 capsule (30 mg total) by mouth daily.  90 capsule  4  . hyaluronate sodium (RADIAPLEXRX) GEL Apply 1 application topically once. Apply after rad txs and bedtime,prn      . ibuprofen (ADVIL,MOTRIN) 200 MG tablet Take 200 mg by mouth every 4 (four) hours as needed for pain.      Marland Kitchen lidocaine-prilocaine (EMLA) cream Apply topically as needed.  30 g  7  . ondansetron (ZOFRAN) 8 MG tablet Take 8 mg by mouth every 8 (eight) hours as needed.        No current facility-administered medications for this visit.    SURGICAL HISTORY:  Past Surgical History  Procedure Laterality Date  . Dilation and curettage of uterus  1993  . Cervical ablation  1983  . Breast biopsy Right 08/06/12  . Popliteal synovial cyst excision  1970  . Portacath placement Left 09/12/2012    Procedure: INSERTION PORT-A-CATH;  Surgeon: Emelia Loron, MD;  Location: [facility] OR;  Service: General;  Laterality: Left;    REVIEW OF SYSTEMS:  A 10 point review of systems was completed and is negative except as noted above.  PHYSICAL EXAMINATION: Blood pressure 129/87, pulse 103, temperature 97.5 F (36.4 C), temperature source Oral, resp. rate 18, height 5\' 7"  (1.702 m), weight 207 lb (93.895 kg). Body mass index is 32.41 kg/(m^2). General:  Patient is a well appearing female in no acute distress HEENT: PERRLA, sclerae anicteric no conjunctival pallor, MMM Neck: supple, no palpable adenopathy Lungs: clear to auscultation bilaterally, no wheezes, rhonchi, or rales Cardiovascular: regular rate rhythm, S1, S2, no murmurs, rubs or gallops Abdomen: Soft, non-tender, non-distended, normoactive bowel sounds, no HSM Extremities: warm and well perfused, no clubbing, cyanosis, or edema Skin: No rashes or lesions, mole on right side of neck with minimal erythema Neuro: Non-focal. ECOG PERFORMANCE STATUS: 1 - Symptomatic but completely ambulatory  LABORATORY DATA: Lab Results  Component Value Date   WBC 4.2 02/13/2013   HGB 12.4 02/13/2013   HCT 37.3 02/13/2013   MCV 90.5 02/13/2013   PLT 276 02/13/2013      Chemistry      Component Value Date/Time   NA 140 02/13/2013 1255   K 3.7 02/13/2013 1255   CO2 27 02/13/2013 1255   BUN 12.9 02/13/2013 1255   CREATININE 0.7 02/13/2013 1255      Component Value Date/Time   CALCIUM 9.4 02/13/2013  1255   ALKPHOS 63 02/13/2013 1255   AST 17 02/13/2013 1255   ALT 23 02/13/2013 1255   BILITOT 0.27 02/13/2013 1255       RADIOGRAPHIC STUDIES: No results found.   ASSESSMENT: Elizabeth Miles is a 47 y.o. female diagnosed by biopsy with stage II, invasive ductal carcinoma of the right breast in 07/2012:  #1 Completed neoadjuvant chemotherapy consisting of Q14 day Adriamycin/Cytoxan x 4 cycles on 10/25/2012.  #2 Received one dose of neoadjuvant chemotherapy with Taxol on 11/08/2012.  She developed significant grade 2 neuropathy and Taxol was discontinued.  #3 Started on 11/15/2012 with neoadjuvant chemotherapy consisting of single agent Abraxane given on day 1, 8, 15 of each 28 day cycle with 4 cycles planned.   #4 Chipped tooth was evaluated by her dentist, and tooth is stable enough to continue chemotherapy.  She will receive a crown once chemotherapy is complete.    PLAN:  #1  Patient is doing well.  Her CBC is stable.  She will proceed with her final cycle of Abraxane tomorrow.    #2 I recommended she walk 10 minutes twice a day and elevate her legs to help with the edema.    #3 She has follow up with Dr. Dwain Sarna on 02/13/13.  I will see her back on 02/26/13 for final f/u with labs prior to surgery.    All questions answered.  Elizabeth Miles and her family/friend were encouraged to contact us in the interim with any questions, concerns, or problems.  I spent 25 minutes counseling the patient face to face.  The total time spent in the appointment was 30 minutes.  Illa Level, NP Medical Oncology [facility] [phone] 02/15/2013    8:06 AM

## 2013-02-13 NOTE — Telephone Encounter (Signed)
Called [facility] path to add pt to the breast cancer conference for 03/05/13.

## 2013-02-13 NOTE — Progress Notes (Signed)
Patient ID: Elizabeth Miles, female   DOB: 10/01/1965, 47 y.o.   MRN: [id number]  Chief Complaint  Patient presents with  . Routine Post Op    breast/ last chemo 02-14-13    HPI Elizabeth Miles is a 47 y.o. female.   HPI 82 yof who I saw initially in July with palpable right breast mass that was larger with a satellite on mri.  This is er pos, pr neg, and her2 not amplified.  We decided to do primary chemotherapy in order to make lumpectomy better option. I thought initially that I would need to take nipple and areola.  She has done well overall with chemotherapy and gets her last cycle tomorrow.  She says the mass is considerably smaller. She has undergone recent mr that shows marked interval decreased with the area measuring 2 x 2.5x 2.7 cm including the primary mass and the satellite lesion.  There is no abnormal enhancement of the right nipple or any skin thickening or enhancement.  The left breast and nodes are all normal. She comes in today to discuss possible surgical options. Her port is bothering her and would like this out also.  Past Medical History  Diagnosis Date  . 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 GERD (gastroesophageal reflux disease)   . GERD (gastroesophageal reflux disease) 08/22/2012  . Allergy   . Complication of anesthesia     1986 ; problem waking up  . Anxiety     Past Surgical History  Procedure Laterality Date  . Dilation and curettage of uterus  1993  . Cervical ablation  1983  . Breast biopsy Right 08/06/12  . Popliteal synovial cyst excision  1970  . Portacath placement Left 09/12/2012    Procedure: INSERTION PORT-A-CATH;  Surgeon: Emelia Loron, MD;  Location: [facility] OR;  Service: General;  Laterality: Left;    Family History  Problem Relation Age of Onset  . Hypertension Mother   . Bladder Cancer Maternal Uncle 68  . Cancer Paternal Grandmother 102    lung cancer  . Lung cancer Paternal Grandfather     dx in his 33s  . COPD  Paternal Uncle     Social History History  Substance Use Topics  . Smoking status: Never Smoker   . Smokeless tobacco: Never Used  . Alcohol Use: No    Allergies  Allergen Reactions  . Darvocet [Propoxyphene-Acetaminophen] Shortness Of Breath and Swelling  . Shellfish Allergy Shortness Of Breath and Swelling  . Other     CT dye, "chest hurt" "makes me feel cold"    Current Outpatient Prescriptions  Medication Sig Dispense Refill  . acetaminophen (TYLENOL) 325 MG tablet Take 650 mg by mouth every 4 (four) hours as needed for pain.      Marland Kitchen Dexlansoprazole (DEXILANT) 30 MG capsule Take 1 capsule (30 mg total) by mouth daily.  90 capsule  4  . hyaluronate sodium (RADIAPLEXRX) GEL Apply 1 application topically once. Apply after rad txs and bedtime,prn      . ibuprofen (ADVIL,MOTRIN) 200 MG tablet Take 200 mg by mouth every 4 (four) hours as needed for pain.      Marland Kitchen lidocaine-prilocaine (EMLA) cream Apply topically as needed.  30 g  7  . ondansetron (ZOFRAN) 8 MG tablet Take 8 mg by mouth every 8 (eight) hours as needed.        No current facility-administered medications for this visit.    Review of Systems Review of  Systems  Constitutional: Negative for fever, chills and unexpected weight change.  HENT: Negative for congestion, hearing loss, sore throat, trouble swallowing and voice change.   Eyes: Negative for visual disturbance.  Respiratory: Negative for cough and wheezing.   Cardiovascular: Negative for chest pain, palpitations and leg swelling.  Gastrointestinal: Negative for nausea, vomiting, abdominal pain, diarrhea, constipation, blood in stool, abdominal distention and anal bleeding.  Genitourinary: Negative for hematuria, vaginal bleeding and difficulty urinating.  Musculoskeletal: Negative for arthralgias.  Skin: Negative for rash and wound.  Neurological: Negative for seizures, syncope and headaches.  Hematological: Negative for adenopathy. Does not bruise/bleed  easily.  Psychiatric/Behavioral: Negative for confusion.    Blood pressure 128/76, pulse 68, temperature 98 F (36.7 C), resp. rate 18, height 5\' 7"  (1.702 m), weight 206 lb (93.441 kg).  Physical Exam Physical Exam  Vitals reviewed. Constitutional: She appears well-developed and well-nourished.  Eyes: No scleral icterus.  Neck: Neck supple.  Cardiovascular: Normal rate, regular rhythm and normal heart sounds.   Pulmonary/Chest: Effort normal and breath sounds normal. She has no wheezes. She has no rales. Right breast exhibits mass. Right breast exhibits no inverted nipple, no nipple discharge, no skin change and no tenderness. Left breast exhibits no inverted nipple, no mass, no nipple discharge, no skin change and no tenderness.    Lymphadenopathy:    She has no cervical adenopathy.    Data Reviewed BILATERAL BREAST MRI WITH AND WITHOUT CONTRAST  LABS: Most recent serum creatinine was 0.8 mg/dL on 16/11/9602.  TECHNIQUE:  Multiplanar, multisequence MR images of both breasts were obtained  prior to and following the intravenous administration of 18ml of  MultiHance.  THREE-DIMENSIONAL MR IMAGE RENDERING ON INDEPENDENT WORKSTATION:  Three-dimensional MR images were rendered by post-processing of the  original MR data on an independent workstation. The  three-dimensional MR images were interpreted, and findings are  reported in the following complete MRI report for this study. Three  dimensional images were evaluated at the independent DynaCad  workstation  COMPARISON: Prior mammograms and breast MRI dated 08/27/2012.  FINDINGS:  Breast composition: c: Heterogeneous fibroglandular tissue  Background parenchymal enhancement: Mild  Right breast: Marked interval decrease in size of irregular  enhancing mass in the upper-outer right breast. This now  demonstrates irregular rim enhancement with central high T2 signal  which may be result of necrosis and/or prior biopsy related  changes.  This measures 1.5 cm AP, 1.4 cm transverse, and 1.9 cm craniocaudal.  The adjacent satellite nodule along the anterior superior margin of  the dominant mass now measures 0.6 cm AP, 0.5 cm transverse, and 0.6  cm craniocaudal. Together the dominant mass and satellite nodule  measure 2 cm AP, 2.5 cm transverse, and 2.7 cm craniocaudal,  previously measured 4.5 x 4 x 3.8 cm.  There is no abnormal enhancement of the right nipple. There is no  abnormal skin thickening or skin enhancement. No new enhancing  masses are new abnormal areas of enhancement seen in the right  breast.  Left breast: No suspicious rapidly enhancing masses or abnormal  areas of enhancement in the left breast to suggest malignancy.  Lymph nodes: No abnormal appearing lymph nodes.  Ancillary findings: None.  IMPRESSION:  Findings compatible with treatment response with interval decrease  in size of dominant mass and satellite nodule in the upper-outer  right breast as well as resolution of right nipple enhancement. The  dominant mass demonstrates peripheral enhancement with internal high  signal likely representing central necrosis. Together  the dominant  mass and satellite nodule now measure 0.2 x 2.5 x 2.7 cm (previously  4.5 x 4 x 3.8 cm).  RECOMMENDATION:  Treatment plan for known right breast malignancy.   Assessment    RIght breast cancer s/p primary chemotherapy    Plan    Right breast wire guided lumpectomy, partial excision of areola, biopsy nipple, right axillary sentinel node biopsy, port removal     We first discussed the port.  She would like it removed.  She does not need for further treatment. I think reasonable and understands there is always a chance one would need to be replaced.   We discussed a sentinel lymph node biopsy as she still does not appear to having lymph node involvement right. We discussed the performance of that with injection of radioactive tracer and blue dye. We  discussed that she would have an incision underneath her axillary hairline. We discussed that there is a chance of having a positive node with a sentinel lymph node biopsy and we will await the permanent pathology to make any other first further decisions in terms of her treatment. One of these options might be to return to the operating room to perform an axillary lymph node dissection. We discussed about a 1-5% risk lifetime of chronic shoulder pain as well as lymphedema associated with a sentinel lymph node biopsy.  We discussed the options for treatment of the breast cancer which included lumpectomy versus a mastectomy again.  She very much would like to undergo lumpectomy.  I think that is reasonable given breast size and response now.  The question is whether to remove the nipple.  Clinically and by mr it appears to be off the nipple.  We discussed a central lumpectomy but decided we would try to save nipple. We will plan on wire guided lumpectomy with removal of a portion of her areola.  I will send intraoperative path below nipple and if positive will excise.  If negative then will just excise areola and await permanent path.  She knows there is chance of returning to or for nipple removal or further surgery for margins.  She also understands there is risk of decreased sensation. She understands we will do this about 3 weeks after completion of chemo and will need radiotherapy about a month after surgery. We discussed the risks of operation including bleeding, infection, possible reoperation.    Elizabeth Miles 02/13/2013, 4:38 PM

## 2013-02-14 ENCOUNTER — Ambulatory Visit (HOSPITAL_BASED_OUTPATIENT_CLINIC_OR_DEPARTMENT_OTHER): Payer: BC Managed Care – PPO

## 2013-02-14 ENCOUNTER — Ambulatory Visit: Payer: BC Managed Care – PPO | Admitting: Oncology

## 2013-02-14 ENCOUNTER — Ambulatory Visit: Payer: BC Managed Care – PPO | Admitting: Family

## 2013-02-14 ENCOUNTER — Other Ambulatory Visit: Payer: BC Managed Care – PPO | Admitting: Lab

## 2013-02-14 ENCOUNTER — Other Ambulatory Visit: Payer: BC Managed Care – PPO

## 2013-02-14 VITALS — BP 120/80 | HR 87 | Temp 98.2°F | Resp 16

## 2013-02-14 DIAGNOSIS — C50419 Malignant neoplasm of upper-outer quadrant of unspecified female breast: Secondary | ICD-10-CM

## 2013-02-14 DIAGNOSIS — Z5111 Encounter for antineoplastic chemotherapy: Secondary | ICD-10-CM

## 2013-02-14 DIAGNOSIS — C50911 Malignant neoplasm of unspecified site of right female breast: Secondary | ICD-10-CM

## 2013-02-14 DIAGNOSIS — C50919 Malignant neoplasm of unspecified site of unspecified female breast: Secondary | ICD-10-CM

## 2013-02-14 MED ORDER — PACLITAXEL PROTEIN-BOUND CHEMO INJECTION 100 MG
100.0000 mg/m2 | Freq: Once | INTRAVENOUS | Status: AC
  Administered 2013-02-14: 200 mg via INTRAVENOUS
  Filled 2013-02-14: qty 40

## 2013-02-14 MED ORDER — HEPARIN SOD (PORK) LOCK FLUSH 100 UNIT/ML IV SOLN
500.0000 [IU] | Freq: Once | INTRAVENOUS | Status: AC | PRN
  Administered 2013-02-14: 500 [IU]
  Filled 2013-02-14: qty 5

## 2013-02-14 MED ORDER — ONDANSETRON 8 MG/NS 50 ML IVPB
INTRAVENOUS | Status: AC
  Filled 2013-02-14: qty 8

## 2013-02-14 MED ORDER — DEXAMETHASONE SODIUM PHOSPHATE 10 MG/ML IJ SOLN
10.0000 mg | Freq: Once | INTRAMUSCULAR | Status: AC
  Administered 2013-02-14: 10 mg via INTRAVENOUS

## 2013-02-14 MED ORDER — DEXAMETHASONE SODIUM PHOSPHATE 10 MG/ML IJ SOLN
INTRAMUSCULAR | Status: AC
  Filled 2013-02-14: qty 1

## 2013-02-14 MED ORDER — SODIUM CHLORIDE 0.9 % IJ SOLN
10.0000 mL | INTRAMUSCULAR | Status: DC | PRN
  Administered 2013-02-14: 10 mL
  Filled 2013-02-14: qty 10

## 2013-02-14 MED ORDER — ONDANSETRON 8 MG/50ML IVPB (CHCC)
8.0000 mg | Freq: Once | INTRAVENOUS | Status: AC
  Administered 2013-02-14: 8 mg via INTRAVENOUS

## 2013-02-14 MED ORDER — SODIUM CHLORIDE 0.9 % IV SOLN
Freq: Once | INTRAVENOUS | Status: AC
  Administered 2013-02-14: 13:00:00 via INTRAVENOUS

## 2013-02-14 NOTE — Patient Instructions (Signed)
Cancer Center Discharge Instructions for Patients Receiving Chemotherapy  Today you received the following chemotherapy agents Abraxane  To help prevent nausea and vomiting after your treatment, we encourage you to take your nausea medication as prescribed.   If you develop nausea and vomiting that is not controlled by your nausea medication, call the clinic.   BELOW ARE SYMPTOMS THAT SHOULD BE REPORTED IMMEDIATELY:  *FEVER GREATER THAN 100.5 F  *CHILLS WITH OR WITHOUT FEVER  NAUSEA AND VOMITING THAT IS NOT CONTROLLED WITH YOUR NAUSEA MEDICATION  *UNUSUAL SHORTNESS OF BREATH  *UNUSUAL BRUISING OR BLEEDING  TENDERNESS IN MOUTH AND THROAT WITH OR WITHOUT PRESENCE OF ULCERS  *URINARY PROBLEMS  *BOWEL PROBLEMS  UNUSUAL RASH Items with * indicate a potential emergency and should be followed up as soon as possible.  Feel free to call the clinic you have any questions or concerns. The clinic phone number is [phone].    

## 2013-02-21 ENCOUNTER — Other Ambulatory Visit: Payer: BC Managed Care – PPO | Admitting: Lab

## 2013-02-21 ENCOUNTER — Ambulatory Visit: Payer: BC Managed Care – PPO

## 2013-02-21 ENCOUNTER — Ambulatory Visit: Payer: BC Managed Care – PPO | Admitting: Adult Health

## 2013-02-21 ENCOUNTER — Ambulatory Visit: Payer: BC Managed Care – PPO | Admitting: Oncology

## 2013-02-25 ENCOUNTER — Other Ambulatory Visit: Payer: Self-pay | Admitting: Emergency Medicine

## 2013-02-25 DIAGNOSIS — C50919 Malignant neoplasm of unspecified site of unspecified female breast: Secondary | ICD-10-CM

## 2013-02-26 ENCOUNTER — Other Ambulatory Visit (HOSPITAL_BASED_OUTPATIENT_CLINIC_OR_DEPARTMENT_OTHER): Payer: BC Managed Care – PPO

## 2013-02-26 ENCOUNTER — Encounter: Payer: Self-pay | Admitting: Adult Health

## 2013-02-26 ENCOUNTER — Telehealth: Payer: Self-pay | Admitting: Oncology

## 2013-02-26 ENCOUNTER — Ambulatory Visit (HOSPITAL_BASED_OUTPATIENT_CLINIC_OR_DEPARTMENT_OTHER): Payer: BC Managed Care – PPO | Admitting: Adult Health

## 2013-02-26 VITALS — BP 125/85 | HR 101 | Temp 97.9°F | Resp 18 | Ht 67.0 in | Wt 211.2 lb

## 2013-02-26 DIAGNOSIS — Z171 Estrogen receptor negative status [ER-]: Secondary | ICD-10-CM

## 2013-02-26 DIAGNOSIS — R5381 Other malaise: Secondary | ICD-10-CM

## 2013-02-26 DIAGNOSIS — C50919 Malignant neoplasm of unspecified site of unspecified female breast: Secondary | ICD-10-CM

## 2013-02-26 DIAGNOSIS — C50419 Malignant neoplasm of upper-outer quadrant of unspecified female breast: Secondary | ICD-10-CM

## 2013-02-26 LAB — COMPREHENSIVE METABOLIC PANEL (CC13)
AST: 23 U/L (ref 5–34)
Albumin: 3.9 g/dL (ref 3.5–5.0)
Alkaline Phosphatase: 57 U/L (ref 40–150)
Anion Gap: 13 mEq/L — ABNORMAL HIGH (ref 3–11)
BUN: 10 mg/dL (ref 7.0–26.0)
CO2: 25 mEq/L (ref 22–29)
Creatinine: 0.7 mg/dL (ref 0.6–1.1)
Glucose: 87 mg/dl (ref 70–140)
Potassium: 4.1 mEq/L (ref 3.5–5.1)
Total Bilirubin: 0.43 mg/dL (ref 0.20–1.20)
Total Protein: 6.9 g/dL (ref 6.4–8.3)

## 2013-02-26 LAB — CBC WITH DIFFERENTIAL/PLATELET
Basophils Absolute: 0.1 10*3/uL (ref 0.0–0.1)
Eosinophils Absolute: 0.1 10*3/uL (ref 0.0–0.5)
HCT: 39 % (ref 34.8–46.6)
HGB: 13 g/dL (ref 11.6–15.9)
LYMPH%: 16.6 % (ref 14.0–49.7)
MCHC: 33.5 g/dL (ref 31.5–36.0)
MCV: 89.4 fL (ref 79.5–101.0)
MONO#: 0.5 10*3/uL (ref 0.1–0.9)
MONO%: 11.4 % (ref 0.0–14.0)
NEUT#: 3.2 10*3/uL (ref 1.5–6.5)
NEUT%: 67.8 % (ref 38.4–76.8)
Platelets: 297 10*3/uL (ref 145–400)
RBC: 4.36 10*6/uL (ref 3.70–5.45)
RDW: 14.8 % — ABNORMAL HIGH (ref 11.2–14.5)
WBC: 4.7 10*3/uL (ref 3.9–10.3)
lymph#: 0.8 10*3/uL — ABNORMAL LOW (ref 0.9–3.3)

## 2013-02-26 NOTE — Progress Notes (Signed)
[facility]  Telephone:([phone]) [phone] Fax:(336) [phone]  OFFICE PROGRESS NOTE  ID: Elizabeth Miles   DOB: Sep 29, 1965  MR#: [id number]  AOZ#:[id number]   PCP: Maximiano Coss, MD [place] PCP:  Georgiann Mccoy, MD   SU:  Emelia Loron, MD RAD ONC:   Lurline Hare, MD  DIAGNOSIS:  Elizabeth Miles is a 47 y.o.  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  STAGE:  Right breast invasive ductal carcinoma Stage II, T2 Nx Mx  ER +, PR -, Her2/Neu -, Ki-67 high   PRIOR THERAPY: #1 Normal mammogram in 07/2011. On physical exam however the possibility of a cyst was noted in the right breast. In 07/2012 patient noted on exam another lump in the right breast. She underwent a diagnostic mammogram on 08/06/2012 that showed a right breast nodule in the outer quadrant. She had an ultrasound performed that showed at the 9:30 o'clock position a 3.6 cm area, and a 10:00 position a 1.3 cm area with a total area measuring between 5-6 cm.  Right breast needle core biopsy on 08/06/2012  performed in [place], [place] showed pathology  that revealed an intermediate grade invasive ductal carcinoma. (This is been confirmed by our pathology as well).  The tumor was estrogen receptor positive strongly (100%), progesterone receptor negative,  HER-2/neu negative, with a Ki-67 that showed a high proliferation rate.  #2 Elizabeth Miles  was seen by Dr. Welton Flakes on 08/22/2012 to discuss treatment options. Due to the size of the tumor Dr. Welton Flakes recommended that she proceed with neoadjuvant treatment initially consisting of chemotherapy. However due to the workup being incomplete I did recommend that she undergo MRI of the breasts as well as staging studies.   #3 Bilateral breast MRI on 08/27/2012 revealed in the right upper quadrant irregular lobulated mass with a satellite nodule or lobulation within 2 mm at its superior aspect, measured together as 4.5 x  4.0 x 3.8 cm.  There was extension of enhancement to the nipple, suggesting nipple involvement may be present.  No lymphadenopathy was noted there was no any other area of abnormal enhancement in either breast (clinical stage IIA, T2 N0).  #4 PET scan performed on 08/29/2012 revealed the primary breast cancer measuring 3.2 x 3.3 cm with SUV of 16. It was adjacent nodule along the superiomedial border of the primary mass measuring 1.4 cm which was also hypermetabolic. There were no additional areas of abnormal hypermetabolism.  No abnormal hypermetabolic activity was seen in the chest, abdomen/pelvis,within the liver, pancreas, adrenal glands or spleen. No hypermetabolic lymph nodes. In the skeleton, no focal hypermetabolic activity to suggest skeletal metastasis was seen.  #5 Patient also seen by Dr. Emelia Loron and Dr. Lurline Hare in consultation. Dr. Dwain Sarna  place the Port-A-Cath on 09/12/2012.  Patient attended a chemotherapy teaching class.   #6 Completed neoadjuvant chemotherapy consisting of Q14 day Adriamycin/Cytoxan x 4 cycles on 10/25/2012.  #7 Received one dose of neoadjuvant chemotherapy with Taxol on 11/08/2012.  She developed significant grade 2 neuropathy and Taxol was discontinued.  #8 Started on 11/15/2012 with neoadjuvant chemotherapy consisting of single agent Abraxane given on day 1, 8, 15 of each 28 day cycle.  She completed therapy on 02/14/13.     CURRENT THERAPY:  Referral back to surgery  INTERVAL HISTORY: Elizabeth Miles is a  47 y.o. female who returns for evaluation following her completion of her chemotherapy regimen.  She remains fatigued today.  She denies fevers, chills, nausea, vomiting, constipation, diarrhea, numbness.  She has met with Dr. Dwain Sarna and discussed surgical planning. Her anticipated surgery date is 03/10/13.  She will see Dr. Dwain Sarna one more time on 03/03/13 to discuss her surgery again.      MEDICAL HISTORY: Past Medical History   Diagnosis Date  . 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 GERD (gastroesophageal reflux disease)   . GERD (gastroesophageal reflux disease) 08/22/2012  . Allergy   . Complication of anesthesia     1986 ; problem waking up  . Anxiety     ALLERGIES:   Allergies  Allergen Reactions  . Darvocet [Propoxyphene N-Acetaminophen] Shortness Of Breath and Swelling  . Shellfish Allergy Shortness Of Breath and Swelling  . Other     CT dye, "chest hurt" "makes me feel cold"     MEDICATIONS:  Current Outpatient Prescriptions  Medication Sig Dispense Refill  . acetaminophen (TYLENOL) 325 MG tablet Take 650 mg by mouth every 4 (four) hours as needed for pain.      Marland Kitchen Dexlansoprazole (DEXILANT) 30 MG capsule Take 1 capsule (30 mg total) by mouth daily.  90 capsule  4  . lidocaine-prilocaine (EMLA) cream Apply topically as needed.  30 g  7  . hyaluronate sodium (RADIAPLEXRX) GEL Apply 1 application topically once. Apply after rad txs and bedtime,prn      . ibuprofen (ADVIL,MOTRIN) 200 MG tablet Take 200 mg by mouth every 4 (four) hours as needed for pain.       No current facility-administered medications for this visit.    SURGICAL HISTORY:  Past Surgical History  Procedure Laterality Date  . Dilation and curettage of uterus  1993  . Cervical ablation  1983  . Breast biopsy Right 08/06/12  . Popliteal synovial cyst excision  1970  . Portacath placement Left 09/12/2012    Procedure: INSERTION PORT-A-CATH;  Surgeon: Emelia Loron, MD;  Location: [facility] OR;  Service: General;  Laterality: Left;    REVIEW OF SYSTEMS:  A 10 point review of systems was completed and is negative except as noted above.  PHYSICAL EXAMINATION: Blood pressure 125/85, pulse 101, temperature 97.9 F (36.6 C), temperature source Oral, resp. rate 18, height 5\' 7"  (1.702 m), weight 211 lb 3 oz (95.794 kg). Body mass index is 33.07 kg/(m^2). General: Patient is a well appearing female in no acute  distress HEENT: PERRLA, sclerae anicteric no conjunctival pallor, MMM Neck: supple, no palpable adenopathy Lungs: clear to auscultation bilaterally, no wheezes, rhonchi, or rales Cardiovascular: regular rate rhythm, S1, S2, no murmurs, rubs or gallops Abdomen: Soft, non-tender, non-distended, normoactive bowel sounds, no HSM Extremities: warm and well perfused, no clubbing, cyanosis, or edema Skin: No rashes or lesions, mole on right side of neck with minimal erythema Neuro: Non-focal. ECOG PERFORMANCE STATUS: 1 - Symptomatic but completely ambulatory  LABORATORY DATA: Lab Results  Component Value Date   WBC 4.7 02/26/2013   HGB 13.0 02/26/2013   HCT 39.0 02/26/2013   MCV 89.4 02/26/2013   PLT 297 02/26/2013      Chemistry      Component Value Date/Time   NA 143 02/26/2013 1240   K 4.1 02/26/2013 1240   CO2 25 02/26/2013 1240   BUN 10.0 02/26/2013 1240   CREATININE 0.7 02/26/2013 1240      Component Value Date/Time   CALCIUM 9.8 02/26/2013 1240   ALKPHOS 57 02/26/2013 1240   AST 23 02/26/2013 1240   ALT 36  02/26/2013 1240   BILITOT 0.43 02/26/2013 1240       RADIOGRAPHIC STUDIES: No results found.   ASSESSMENT: Elizabeth Miles is a 47 y.o. female diagnosed by biopsy with stage II, invasive ductal carcinoma of the right breast in 07/2012:  #1 Completed neoadjuvant chemotherapy consisting of Q14 day Adriamycin/Cytoxan x 4 cycles on 10/25/2012.  #2 Received one dose of neoadjuvant chemotherapy with Taxol on 11/08/2012.  She developed significant grade 2 neuropathy and Taxol was discontinued.  #3 Started on 11/15/2012 with neoadjuvant chemotherapy consisting of single agent Abraxane given on day 1, 8, 15 of each 28 day cycle with 4 cycles planned.  This was completed on 02/14/13.     #4 Chipped tooth was evaluated by her dentist, and tooth is stable enough to continue chemotherapy.  She will receive a crown once chemotherapy is complete.    PLAN:  #1  Patient is  doing well today.  Her CBC is stable today.  I reviewed it with her in detail.  A CMP is pending.  I gave her reassurance about her fatigue.    #2  We discussed her upcoming surgery.  We will have her return to the office 2 weeks following her surgery so we can review her pathology and make the necessary referrals and further treatment recommendations.    All questions answered.  Elizabeth Miles and her family/friend were encouraged to contact us in the interim with any questions, concerns, or problems.  I spent 25 minutes counseling the patient face to face.  The total time spent in the appointment was 30 minutes.  Illa Level, NP Medical Oncology [facility] [phone] 02/27/2013    1:16 PM

## 2013-02-26 NOTE — Telephone Encounter (Signed)
gv pt appt schedule for february. no availability w/KK wk of 1/26. pt scheduled for 2/5. message to [facility] and asked for specific d/t if 2/5 is not ok

## 2013-02-26 NOTE — Patient Instructions (Signed)
2 Gram Low Sodium Diet A 2 gram sodium diet restricts the amount of sodium in the diet to no more than 2 g or 2000 mg daily. Limiting the amount of sodium is often used to help lower blood pressure. It is important if you have heart, liver, or kidney problems. Many foods contain sodium for flavor and sometimes as a preservative. When the amount of sodium in a diet needs to be low, it is important to know what to look for when choosing foods and drinks. The following includes some information and guidelines to help make it easier for you to adapt to a low sodium diet. QUICK TIPS  Do not add salt to food.  Avoid convenience items and fast food.  Choose unsalted snack foods.  Buy lower sodium products, often labeled as "lower sodium" or "no salt added."  Check food labels to learn how much sodium is in 1 serving.  When eating at a restaurant, ask that your food be prepared with less salt or none, if possible. READING FOOD LABELS FOR SODIUM INFORMATION The nutrition facts label is a good place to find how much sodium is in foods. Look for products with no more than 500 to 600 mg of sodium per meal and no more than 150 mg per serving. Remember that 2 g = 2000 mg. The food label may also list foods as:  Sodium-free: Less than 5 mg in a serving.  Very low sodium: 35 mg or less in a serving.  Low-sodium: 140 mg or less in a serving.  Light in sodium: 50% less sodium in a serving. For example, if a food that usually has 300 mg of sodium is changed to become light in sodium, it will have 150 mg of sodium.  Reduced sodium: 25% less sodium in a serving. For example, if a food that usually has 400 mg of sodium is changed to reduced sodium, it will have 300 mg of sodium. CHOOSING FOODS Grains  Avoid: Salted crackers and snack items. Some cereals, including instant hot cereals. Bread stuffing and biscuit mixes. Seasoned rice or pasta mixes.  Choose: Unsalted snack items. Low-sodium cereals, oats,  puffed wheat and rice, shredded wheat. English muffins and bread. Pasta. Meats  Avoid: Salted, canned, smoked, spiced, pickled meats, including fish and poultry. Bacon, ham, sausage, cold cuts, hot dogs, anchovies.  Choose: Low-sodium canned tuna and salmon. Fresh or frozen meat, poultry, and fish. Dairy  Avoid: Processed cheese and spreads. Cottage cheese. Buttermilk and condensed milk. Regular cheese.  Choose: Milk. Low-sodium cottage cheese. Yogurt. Sour cream. Low-sodium cheese. Fruits and Vegetables  Avoid: Regular canned vegetables. Regular canned tomato sauce and paste. Frozen vegetables in sauces. Olives. Pickles. Relishes. Sauerkraut.  Choose: Low-sodium canned vegetables. Low-sodium tomato sauce and paste. Frozen or fresh vegetables. Fresh and frozen fruit. Condiments  Avoid: Canned and packaged gravies. Worcestershire sauce. Tartar sauce. Barbecue sauce. Soy sauce. Steak sauce. Ketchup. Onion, garlic, and table salt. Meat flavorings and tenderizers.  Choose: Fresh and dried herbs and spices. Low-sodium varieties of mustard and ketchup. Lemon juice. Tabasco sauce. Horseradish. SAMPLE 2 GRAM SODIUM MEAL PLAN Breakfast / Sodium (mg)  1 cup low-fat milk / 143 mg  2 slices whole-wheat toast / 270 mg  1 tbs heart-healthy margarine / 153 mg  1 hard-boiled egg / 139 mg  1 small orange / 0 mg Lunch / Sodium (mg)  1 cup raw carrots / 76 mg   cup hummus / 298 mg  1 cup low-fat milk /   143 mg   cup red grapes / 2 mg  1 whole-wheat pita bread / 356 mg Dinner / Sodium (mg)  1 cup whole-wheat pasta / 2 mg  1 cup low-sodium tomato sauce / 73 mg  3 oz lean ground beef / 57 mg  1 small side salad (1 cup raw spinach leaves,  cup cucumber,  cup yellow bell pepper) with 1 tsp olive oil and 1 tsp red wine vinegar / 25 mg Snack / Sodium (mg)  1 container low-fat vanilla yogurt / 107 mg  3 graham cracker squares / 127 mg Nutrient Analysis  Calories: 2033  Protein:  77 g  Carbohydrate: 282 g  Fat: 72 g  Sodium: 1971 mg Document Released: 02/13/2005 Document Revised: 05/08/2011 Document Reviewed: 05/17/2009 [employer] Patient Information 2014 [employer].  

## 2013-02-28 ENCOUNTER — Ambulatory Visit: Payer: BC Managed Care – PPO | Admitting: Oncology

## 2013-02-28 ENCOUNTER — Other Ambulatory Visit: Payer: BC Managed Care – PPO | Admitting: Lab

## 2013-02-28 ENCOUNTER — Ambulatory Visit: Payer: BC Managed Care – PPO | Admitting: Adult Health

## 2013-03-03 ENCOUNTER — Ambulatory Visit (INDEPENDENT_AMBULATORY_CARE_PROVIDER_SITE_OTHER): Payer: BC Managed Care – PPO | Admitting: General Surgery

## 2013-03-03 ENCOUNTER — Encounter (INDEPENDENT_AMBULATORY_CARE_PROVIDER_SITE_OTHER): Payer: Self-pay | Admitting: General Surgery

## 2013-03-03 ENCOUNTER — Encounter (INDEPENDENT_AMBULATORY_CARE_PROVIDER_SITE_OTHER): Payer: Self-pay

## 2013-03-03 VITALS — BP 132/78 | HR 88 | Temp 98.2°F | Resp 14 | Ht 67.0 in | Wt 209.6 lb

## 2013-03-03 DIAGNOSIS — C50919 Malignant neoplasm of unspecified site of unspecified female breast: Secondary | ICD-10-CM

## 2013-03-03 NOTE — Progress Notes (Signed)
Subjective:     Patient ID: Elizabeth Miles, female   DOB: [birth year], 48 y.o.   MRN: [id number]  HPI 78 yof who I saw initially in July with palpable right breast mass that was larger with a satellite on mri. This is er pos, pr neg, and her2 not amplified. We decided to do primary chemotherapy in order to make lumpectomy better option. I thought initially that I would need to take nipple and areola. She has no completed her chemotherapy. She has undergone recent mr that shows marked interval decreased with the area measuring 2 x 2.5x 2.7 cm including the primary mass and the satellite lesion. There is no abnormal enhancement of the right nipple or any skin thickening or enhancement. The left breast and nodes are all normal.  I saw her recently and we planned for right breast wire guided lumpectomy, right axillary sentinel node biopsy and port removal.  See below for discussion of performance of lumpectomy.  She would very much like to try to save nipple.   Review of Systems     Objective:   Physical Exam deferred    Assessment:     Right breast cancer s/p primary chemotherapy    Plan:     Right breast wire guided lumpectomy, partial excision of areola, biopsy nipple, right axillary sentinel node biopsy, port removal  We discussed a sentinel lymph node biopsy as she still does not appear to having lymph node involvement right. We discussed the performance of that with injection of radioactive tracer and blue dye. We discussed that she would have an incision underneath her axillary hairline. We discussed that there is a chance of having a positive node with a sentinel lymph node biopsy and we will await the permanent pathology to make any other first further decisions in terms of her treatment. One of these options might be to return to the operating room to perform an axillary lymph node dissection. We discussed about a 1-5% risk lifetime of chronic shoulder pain as well as lymphedema associated  with a sentinel lymph node biopsy.  We plan for lumpectomy. I think that is reasonable given breast size and response now. The question is whether to remove the nipple. Clinically and by mr it appears to be off the nipple. We discussed a central lumpectomy but decided we would try to save nipple. We will plan on wire guided lumpectomy with removal of a portion of her areola. I will send intraoperative path below nipple and if positive will excise. If negative then will just excise areola and await permanent path. She knows there is chance of returning to or for nipple removal or further surgery for margins. She also understands there is risk of decreased sensation. Plan for surgery next week.  She has normal labs and continues to get better s/p chemo.   We discussed the risks of operation including bleeding, infection, possible reoperation.

## 2013-03-04 ENCOUNTER — Encounter (HOSPITAL_BASED_OUTPATIENT_CLINIC_OR_DEPARTMENT_OTHER): Payer: Self-pay | Admitting: *Deleted

## 2013-03-04 NOTE — Progress Notes (Signed)
Labs done cancer center 02/26/13-ccs labs done there No other problems

## 2013-03-09 ENCOUNTER — Emergency Department (HOSPITAL_COMMUNITY)
Admission: EM | Admit: 2013-03-09 | Discharge: 2013-03-09 | Disposition: A | Discharge status: Home / Self Care | Payer: BC Managed Care – PPO | Attending: Emergency Medicine | Admitting: Emergency Medicine

## 2013-03-09 ENCOUNTER — Encounter (HOSPITAL_COMMUNITY): Payer: Self-pay | Admitting: Emergency Medicine

## 2013-03-09 DIAGNOSIS — M79609 Pain in unspecified limb: Secondary | ICD-10-CM | POA: Insufficient documentation

## 2013-03-09 DIAGNOSIS — M5430 Sciatica, unspecified side: Secondary | ICD-10-CM | POA: Insufficient documentation

## 2013-03-09 DIAGNOSIS — M5431 Sciatica, right side: Secondary | ICD-10-CM

## 2013-03-09 DIAGNOSIS — K219 Gastro-esophageal reflux disease without esophagitis: Secondary | ICD-10-CM | POA: Insufficient documentation

## 2013-03-09 DIAGNOSIS — Z8659 Personal history of other mental and behavioral disorders: Secondary | ICD-10-CM | POA: Insufficient documentation

## 2013-03-09 DIAGNOSIS — Z9221 Personal history of antineoplastic chemotherapy: Secondary | ICD-10-CM | POA: Insufficient documentation

## 2013-03-09 DIAGNOSIS — Z853 Personal history of malignant neoplasm of breast: Secondary | ICD-10-CM

## 2013-03-09 DIAGNOSIS — Z79899 Other long term (current) drug therapy: Secondary | ICD-10-CM | POA: Insufficient documentation

## 2013-03-09 DIAGNOSIS — C50919 Malignant neoplasm of unspecified site of unspecified female breast: Secondary | ICD-10-CM | POA: Insufficient documentation

## 2013-03-09 MED ORDER — HYDROMORPHONE HCL PF 2 MG/ML IJ SOLN
2.0000 mg | Freq: Once | INTRAMUSCULAR | Status: AC
  Administered 2013-03-09: 2 mg via INTRAMUSCULAR
  Filled 2013-03-09: qty 1

## 2013-03-09 MED ORDER — KETOROLAC TROMETHAMINE 60 MG/2ML IM SOLN: 60.0000 mg | Freq: Once | INTRAMUSCULAR | Status: DC

## 2013-03-09 MED ORDER — OXYCODONE-ACETAMINOPHEN 5-325 MG PO TABS: 2.0000 | ORAL_TABLET | ORAL | Status: DC | PRN

## 2013-03-09 NOTE — Discharge Instructions (Signed)
Percocet as needed for pain.  Dr. Donne Hazel we'll notify you in the morning if this new condition we'll postpone her surgery.  Return to the emergency department if you develop bowel or bladder incontinence, weakness in the legs, or other new or concerning symptoms.   Sciatica Sciatica is pain, weakness, numbness, or tingling along the path of the sciatic nerve. The nerve starts in the lower back and runs down the back of each leg. The nerve controls the muscles in the lower leg and in the back of the knee, while also providing sensation to the back of the thigh, lower leg, and the sole of your foot. Sciatica is a symptom of another medical condition. For instance, nerve damage or certain conditions, such as a herniated disk or bone spur on the spine, pinch or put pressure on the sciatic nerve. This causes the pain, weakness, or other sensations normally associated with sciatica. Generally, sciatica only affects one side of the body. CAUSES   Herniated or slipped disc.  Degenerative disk disease.  A pain disorder involving the narrow muscle in the buttocks (piriformis syndrome).  Pelvic injury or fracture.  Pregnancy.  Tumor (rare). SYMPTOMS  Symptoms can vary from mild to very severe. The symptoms usually travel from the low back to the buttocks and down the back of the leg. Symptoms can include:  Mild tingling or dull aches in the lower back, leg, or hip.  Numbness in the back of the calf or sole of the foot.  Burning sensations in the lower back, leg, or hip.  Sharp pains in the lower back, leg, or hip.  Leg weakness.  Severe back pain inhibiting movement. These symptoms may get worse with coughing, sneezing, laughing, or prolonged sitting or standing. Also, being overweight may worsen symptoms. DIAGNOSIS  Your caregiver will perform a physical exam to look for common symptoms of sciatica. He or she may ask you to do certain movements or activities that would trigger sciatic  nerve pain. Other tests may be performed to find the cause of the sciatica. These may include:  Blood tests.  X-rays.  Imaging tests, such as an MRI or CT scan. TREATMENT  Treatment is directed at the cause of the sciatic pain. Sometimes, treatment is not necessary and the pain and discomfort goes away on its own. If treatment is needed, your caregiver may suggest:  Over-the-counter medicines to relieve pain.  Prescription medicines, such as anti-inflammatory medicine, muscle relaxants, or narcotics.  Applying heat or ice to the painful area.  Steroid injections to lessen pain, irritation, and inflammation around the nerve.  Reducing activity during periods of pain.  Exercising and stretching to strengthen your abdomen and improve flexibility of your spine. Your caregiver may suggest losing weight if the extra weight makes the back pain worse.  Physical therapy.  Surgery to eliminate what is pressing or pinching the nerve, such as a bone spur or part of a herniated disk. HOME CARE INSTRUCTIONS   Only take over-the-counter or prescription medicines for pain or discomfort as directed by your caregiver.  Apply ice to the affected area for 20 minutes, 3 4 times a day for the first 48 72 hours. Then try heat in the same way.  Exercise, stretch, or perform your usual activities if these do not aggravate your pain.  Attend physical therapy sessions as directed by your caregiver.  Keep all follow-up appointments as directed by your caregiver.  Do not wear high heels or shoes that do not provide  proper support.  Check your mattress to see if it is too soft. A firm mattress may lessen your pain and discomfort. SEEK IMMEDIATE MEDICAL CARE IF:   You lose control of your bowel or bladder (incontinence).  You have increasing weakness in the lower back, pelvis, buttocks, or legs.  You have redness or swelling of your back.  You have a burning sensation when you urinate.  You have  pain that gets worse when you lie down or awakens you at night.  Your pain is worse than you have experienced in the past.  Your pain is lasting longer than 4 weeks.  You are suddenly losing weight without reason. MAKE SURE YOU:  Understand these instructions.  Will watch your condition.  Will get help right away if you are not doing well or get worse. Document Released: 02/07/2001 Document Revised: 08/15/2011 Document Reviewed: 06/25/2011 [facility] Patient Information 2014 [place].

## 2013-03-09 NOTE — ED Provider Notes (Signed)
CSN: [id number]     Arrival date & time 03/09/13  7829 History   First MD Initiated Contact with Patient 03/09/13 [phone]     Chief Complaint  Patient presents with  . Hip Pain  . Leg Pain   (Consider location/radiation/quality/duration/timing/severity/associated sxs/prior Treatment) HPI Comments: Patient is a 48 year old female with history of breast cancer status post chemotherapy. She is due to undergo a surgery tomorrow to remove the tumor. She started yesterday with pain in her right hip and buttock that radiates down her right leg. She denies any injury or trauma. She denies any weakness. She denies any bowel or bladder complaints.  Patient is a 48 y.o. female presenting with hip pain. The history is provided by the patient.  Hip Pain This is a new problem. The current episode started yesterday. The problem occurs constantly. The problem has been rapidly worsening. Pertinent negatives include no chest pain, no abdominal pain and no shortness of breath. The symptoms are aggravated by walking and standing. Nothing relieves the symptoms. She has tried nothing for the symptoms.    Past Medical History  Diagnosis Date  . 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 GERD (gastroesophageal reflux disease)   . GERD (gastroesophageal reflux disease) 08/22/2012  . Allergy   . Complication of anesthesia     1986 ; problem waking up  . Anxiety   . Wears glasses    Past Surgical History  Procedure Laterality Date  . Dilation and curettage of uterus  1993  . Cervical ablation  1983  . Breast biopsy Right 08/06/12  . Popliteal synovial cyst excision  1970  . Portacath placement Left 09/12/2012    Procedure: INSERTION PORT-A-CATH;  Surgeon: Rolm Bookbinder, MD;  Location: [facility] OR;  Service: General;  Laterality: Left;   Family History  Problem Relation Age of Onset  . Hypertension Mother   . Bladder Cancer Maternal Uncle 68  . Cancer Paternal Grandmother 76    lung cancer  . Lung  cancer Paternal Grandfather     dx in his 23s  . COPD Paternal Uncle    History  Substance Use Topics  . Smoking status: Never Smoker   . Smokeless tobacco: Never Used  . Alcohol Use: No   OB History   Grav Para Term Preterm Abortions TAB SAB Ect Mult Living                 Review of Systems  Respiratory: Negative for shortness of breath.   Cardiovascular: Negative for chest pain.  Gastrointestinal: Negative for abdominal pain.  All other systems reviewed and are negative.    Allergies  Darvocet; Shellfish allergy; and Other  Home Medications   Current Outpatient Rx  Name  Route  Sig  Dispense  Refill  . acetaminophen (TYLENOL) 325 MG tablet   Oral   Take 650 mg by mouth every 4 (four) hours as needed for pain.         Marland Kitchen Dexlansoprazole (DEXILANT) 30 MG capsule   Oral   Take 1 capsule (30 mg total) by mouth daily.   90 capsule   4   . hyaluronate sodium (RADIAPLEXRX) GEL   Topical   Apply 1 application topically once. Apply after rad txs and bedtime,prn         . ibuprofen (ADVIL,MOTRIN) 200 MG tablet   Oral   Take 200 mg by mouth every 4 (four) hours as needed for pain.  BP 126/67  Pulse 89  Temp(Src) 97.8 F (36.6 C) (Oral)  Resp 22  SpO2 97% Physical Exam  Nursing note and vitals reviewed. Constitutional: She is oriented to person, place, and time. She appears well-developed and well-nourished. No distress.  Patient is a 48 year old female who appears uncomfortable.  HENT:  Head: Normocephalic and atraumatic.  Neck: Normal range of motion. Neck supple.  Musculoskeletal: Normal range of motion. She exhibits no edema.  There is tenderness to palpation in the right buttock. There is no calf swelling, tenderness, and Homans sign is absent.  Neurological: She is alert and oriented to person, place, and time.  Deep tendon reflexes are trace and equal bilaterally in the lower extremities. Strength is 5 out of 5 in the bilateral lower  extremities. She is able to ambulate, however with discomfort.  Skin: Skin is warm and dry. She is not diaphoretic.    ED Course  Procedures (including critical care time) Labs Review Labs Reviewed - No data to display Imaging Review No results found.    MDM  No diagnosis found. Patient is a 48 year old female with history of breast cancer set to undergo a mastectomy or lumpectomy tomorrow. She started yesterday with pain in her right buttock that radiates down her right leg. This occurred in the absence of any injury or trauma. She has no bowel or bladder complaints and her physical examination reveals symmetrical reflexes and equal strength. There does not appear to be an emergent cause of her discomfort that would warrant an emergent MRI area she was given an IM injection of dilaudid and is feeling better. I've spoken with Dr. Hassell Done from general surgery to make him aware of the patient's new condition and impending surgery tomorrow. He does not feel as though emergent imaging is indicated and feels as though she is stable for discharge. He will notify Dr. Donne Hazel of her visit to the ER. If this is a concern to him that would postpone the surgery he will notify them in the morning.    Veryl Speak, MD 03/09/13 1050

## 2013-03-09 NOTE — ED Notes (Signed)
Pt has breast cancer and is scheduled for a lumpectomy tomorrow.  States that yesterday, she started having rt hip pain that radiates all the way down her leg.  Denies injury.

## 2013-03-10 ENCOUNTER — Encounter (HOSPITAL_BASED_OUTPATIENT_CLINIC_OR_DEPARTMENT_OTHER): Payer: BC Managed Care – PPO | Admitting: Anesthesiology

## 2013-03-10 ENCOUNTER — Ambulatory Visit (HOSPITAL_BASED_OUTPATIENT_CLINIC_OR_DEPARTMENT_OTHER)
Admission: RE | Admit: 2013-03-10 | Discharge: 2013-03-10 | Disposition: A | Discharge status: Home / Self Care | Payer: BC Managed Care – PPO | Source: Ambulatory Visit | Attending: General Surgery | Admitting: General Surgery

## 2013-03-10 ENCOUNTER — Encounter (HOSPITAL_BASED_OUTPATIENT_CLINIC_OR_DEPARTMENT_OTHER)
Admission: RE | Disposition: A | Discharge status: Home / Self Care | Payer: Self-pay | Source: Ambulatory Visit | Attending: General Surgery

## 2013-03-10 ENCOUNTER — Encounter (HOSPITAL_COMMUNITY)
Admission: RE | Admit: 2013-03-10 | Discharge: 2013-03-10 | Disposition: A | Discharge status: Home / Self Care | Payer: BC Managed Care – PPO | Source: Ambulatory Visit | Attending: General Surgery | Admitting: General Surgery

## 2013-03-10 ENCOUNTER — Encounter (HOSPITAL_BASED_OUTPATIENT_CLINIC_OR_DEPARTMENT_OTHER): Payer: Self-pay | Admitting: *Deleted

## 2013-03-10 ENCOUNTER — Ambulatory Visit (HOSPITAL_BASED_OUTPATIENT_CLINIC_OR_DEPARTMENT_OTHER): Payer: BC Managed Care – PPO | Admitting: Anesthesiology

## 2013-03-10 ENCOUNTER — Ambulatory Visit
Admission: RE | Admit: 2013-03-10 | Discharge: 2013-03-10 | Disposition: A | Discharge status: Home / Self Care | Payer: BC Managed Care – PPO | Source: Ambulatory Visit | Attending: General Surgery | Admitting: General Surgery

## 2013-03-10 DIAGNOSIS — C50911 Malignant neoplasm of unspecified site of right female breast: Secondary | ICD-10-CM

## 2013-03-10 DIAGNOSIS — C50919 Malignant neoplasm of unspecified site of unspecified female breast: Secondary | ICD-10-CM | POA: Insufficient documentation

## 2013-03-10 DIAGNOSIS — Z452 Encounter for adjustment and management of vascular access device: Secondary | ICD-10-CM

## 2013-03-10 HISTORY — DX: Presence of spectacles and contact lenses: Z97.3

## 2013-03-10 HISTORY — PX: PORT-A-CATH REMOVAL: SHX5289

## 2013-03-10 HISTORY — PX: BREAST LUMPECTOMY WITH NEEDLE LOCALIZATION AND AXILLARY SENTINEL LYMPH NODE BX: SHX5760

## 2013-03-10 LAB — POCT HEMOGLOBIN-HEMACUE: Hemoglobin: 14.6 g/dL (ref 12.0–15.0)

## 2013-03-10 SURGERY — BREAST LUMPECTOMY WITH NEEDLE LOCALIZATION AND AXILLARY SENTINEL LYMPH NODE BX
Anesthesia: Monitor Anesthesia Care | Site: Chest | Laterality: Right

## 2013-03-10 MED ORDER — HYDROMORPHONE HCL PF 1 MG/ML IJ SOLN
INTRAMUSCULAR | Status: AC
  Filled 2013-03-10: qty 1

## 2013-03-10 MED ORDER — MIDAZOLAM HCL 2 MG/2ML IJ SOLN
INTRAMUSCULAR | Status: AC
  Filled 2013-03-10: qty 2

## 2013-03-10 MED ORDER — FENTANYL CITRATE 0.05 MG/ML IJ SOLN
INTRAMUSCULAR | Status: AC
  Filled 2013-03-10: qty 2

## 2013-03-10 MED ORDER — OXYCODONE-ACETAMINOPHEN 10-325 MG PO TABS: 1.0000 | ORAL_TABLET | Freq: Four times a day (QID) | ORAL | Status: DC | PRN

## 2013-03-10 MED ORDER — MIDAZOLAM HCL 2 MG/2ML IJ SOLN
1.0000 mg | INTRAMUSCULAR | Status: DC | PRN
  Administered 2013-03-10: 2 mg via INTRAVENOUS

## 2013-03-10 MED ORDER — OXYCODONE HCL 5 MG/5ML PO SOLN: 5.0000 mg | Freq: Once | ORAL | Status: AC | PRN

## 2013-03-10 MED ORDER — BUPIVACAINE HCL (PF) 0.25 % IJ SOLN
INTRAMUSCULAR | Status: AC
  Filled 2013-03-10: qty 30

## 2013-03-10 MED ORDER — FENTANYL CITRATE 0.05 MG/ML IJ SOLN
INTRAMUSCULAR | Status: DC | PRN
  Administered 2013-03-10 (×4): 50 ug via INTRAVENOUS

## 2013-03-10 MED ORDER — CEFAZOLIN SODIUM-DEXTROSE 2-3 GM-% IV SOLR
INTRAVENOUS | Status: AC
  Filled 2013-03-10: qty 50

## 2013-03-10 MED ORDER — SODIUM CHLORIDE 0.9 % IJ SOLN
INTRAMUSCULAR | Status: AC
  Filled 2013-03-10: qty 10

## 2013-03-10 MED ORDER — BUPIVACAINE HCL (PF) 0.5 % IJ SOLN
INTRAMUSCULAR | Status: AC
  Filled 2013-03-10: qty 30

## 2013-03-10 MED ORDER — PROPOFOL 10 MG/ML IV BOLUS
INTRAVENOUS | Status: DC | PRN
  Administered 2013-03-10: 150 mg via INTRAVENOUS

## 2013-03-10 MED ORDER — BUPIVACAINE HCL (PF) 0.25 % IJ SOLN
INTRAMUSCULAR | Status: DC | PRN
  Administered 2013-03-10: 24 mL

## 2013-03-10 MED ORDER — OXYCODONE HCL 5 MG PO TABS
5.0000 mg | ORAL_TABLET | Freq: Once | ORAL | Status: AC | PRN
  Administered 2013-03-10: 5 mg via ORAL
  Filled 2013-03-10: qty 1

## 2013-03-10 MED ORDER — HYDROMORPHONE HCL PF 1 MG/ML IJ SOLN
0.2500 mg | INTRAMUSCULAR | Status: DC | PRN
  Administered 2013-03-10 (×3): 0.5 mg via INTRAVENOUS

## 2013-03-10 MED ORDER — ONDANSETRON HCL 4 MG/2ML IJ SOLN
INTRAMUSCULAR | Status: DC | PRN
  Administered 2013-03-10: 4 mg via INTRAVENOUS

## 2013-03-10 MED ORDER — MIDAZOLAM HCL 5 MG/5ML IJ SOLN
INTRAMUSCULAR | Status: DC | PRN
  Administered 2013-03-10: 2 mg via INTRAVENOUS

## 2013-03-10 MED ORDER — FENTANYL CITRATE 0.05 MG/ML IJ SOLN
INTRAMUSCULAR | Status: AC
  Filled 2013-03-10: qty 6

## 2013-03-10 MED ORDER — CEFAZOLIN SODIUM-DEXTROSE 2-3 GM-% IV SOLR
2.0000 g | INTRAVENOUS | Status: AC
  Administered 2013-03-10: 2 g via INTRAVENOUS

## 2013-03-10 MED ORDER — PROMETHAZINE HCL 25 MG/ML IJ SOLN: 6.2500 mg | INTRAMUSCULAR | Status: DC | PRN

## 2013-03-10 MED ORDER — TECHNETIUM TC 99M SULFUR COLLOID FILTERED
1.0000 | Freq: Once | INTRAVENOUS | Status: AC | PRN
  Administered 2013-03-10: 1 via INTRADERMAL

## 2013-03-10 MED ORDER — FENTANYL CITRATE 0.05 MG/ML IJ SOLN
50.0000 ug | Freq: Once | INTRAMUSCULAR | Status: AC
  Administered 2013-03-10: 100 ug via INTRAVENOUS

## 2013-03-10 MED ORDER — LACTATED RINGERS IV SOLN
INTRAVENOUS | Status: DC
  Administered 2013-03-10 (×2): via INTRAVENOUS

## 2013-03-10 MED ORDER — FENTANYL CITRATE 0.05 MG/ML IJ SOLN
INTRAMUSCULAR | Status: AC
  Filled 2013-03-10: qty 4

## 2013-03-10 MED ORDER — SODIUM CHLORIDE 0.9 % IJ SOLN
INTRAMUSCULAR | Status: DC | PRN
  Administered 2013-03-10: 13:00:00

## 2013-03-10 MED ORDER — LIDOCAINE HCL (CARDIAC) 20 MG/ML IV SOLN
INTRAVENOUS | Status: DC | PRN
  Administered 2013-03-10: 100 mg via INTRAVENOUS

## 2013-03-10 MED ORDER — METHYLENE BLUE 1 % INJ SOLN
INTRAMUSCULAR | Status: AC
  Filled 2013-03-10: qty 10

## 2013-03-10 MED ORDER — HYDROMORPHONE HCL PF 1 MG/ML IJ SOLN
INTRAMUSCULAR | Status: AC
Start: 2013-03-10 — End: 2013-03-10
  Filled 2013-03-10: qty 1

## 2013-03-10 MED ORDER — DEXAMETHASONE SODIUM PHOSPHATE 4 MG/ML IJ SOLN
INTRAMUSCULAR | Status: DC | PRN
  Administered 2013-03-10: 10 mg via INTRAVENOUS

## 2013-03-10 SURGICAL SUPPLY — 66 items
APPLIER CLIP 9.375 MED OPEN (MISCELLANEOUS) ×4
BENZOIN TINCTURE PRP APPL 2/3 (GAUZE/BANDAGES/DRESSINGS) IMPLANT
BINDER BREAST LRG (GAUZE/BANDAGES/DRESSINGS) IMPLANT
BINDER BREAST MEDIUM (GAUZE/BANDAGES/DRESSINGS) IMPLANT
BINDER BREAST XLRG (GAUZE/BANDAGES/DRESSINGS) IMPLANT
BINDER BREAST XXLRG (GAUZE/BANDAGES/DRESSINGS) IMPLANT
BLADE SURG 15 STRL LF DISP TIS (BLADE) ×2 IMPLANT
BLADE SURG 15 STRL SS (BLADE) ×2
BNDG COHESIVE 4X5 TAN STRL (GAUZE/BANDAGES/DRESSINGS) IMPLANT
CANISTER SUCT 1200ML W/VALVE (MISCELLANEOUS) ×4 IMPLANT
CHLORAPREP W/TINT 26ML (MISCELLANEOUS) ×4 IMPLANT
CLIP APPLIE 9.375 MED OPEN (MISCELLANEOUS) ×2 IMPLANT
CLOSURE WOUND 1/2 X4 (GAUZE/BANDAGES/DRESSINGS)
COVER MAYO STAND STRL (DRAPES) ×4 IMPLANT
COVER PROBE W GEL 5X96 (DRAPES) ×4 IMPLANT
COVER TABLE BACK 60X90 (DRAPES) ×4 IMPLANT
DECANTER SPIKE VIAL GLASS SM (MISCELLANEOUS) IMPLANT
DERMABOND ADVANCED (GAUZE/BANDAGES/DRESSINGS) ×4
DERMABOND ADVANCED .7 DNX12 (GAUZE/BANDAGES/DRESSINGS) ×4 IMPLANT
DEVICE DUBIN W/COMP PLATE 8390 (MISCELLANEOUS) ×4 IMPLANT
DRAIN CHANNEL 19F RND (DRAIN) IMPLANT
DRAPE LAPAROSCOPIC ABDOMINAL (DRAPES) ×4 IMPLANT
DRAPE U-SHAPE 76X120 STRL (DRAPES) IMPLANT
DRSG TEGADERM 4X4.75 (GAUZE/BANDAGES/DRESSINGS) ×8 IMPLANT
ELECT COATED BLADE 2.86 ST (ELECTRODE) ×4 IMPLANT
ELECT REM PT RETURN 9FT ADLT (ELECTROSURGICAL) ×4
ELECTRODE REM PT RTRN 9FT ADLT (ELECTROSURGICAL) ×2 IMPLANT
EVACUATOR SILICONE 100CC (DRAIN) IMPLANT
GLOVE BIO SURGEON STRL SZ7 (GLOVE) ×4 IMPLANT
GLOVE BIOGEL PI IND STRL 7.0 (GLOVE) ×2 IMPLANT
GLOVE BIOGEL PI IND STRL 7.5 (GLOVE) ×2 IMPLANT
GLOVE BIOGEL PI INDICATOR 7.0 (GLOVE) ×2
GLOVE BIOGEL PI INDICATOR 7.5 (GLOVE) ×2
GLOVE ECLIPSE 6.5 STRL STRAW (GLOVE) ×4 IMPLANT
GOWN STRL REUS W/ TWL LRG LVL3 (GOWN DISPOSABLE) ×4 IMPLANT
GOWN STRL REUS W/TWL LRG LVL3 (GOWN DISPOSABLE) ×4
KIT MARKER MARGIN INK (KITS) ×4 IMPLANT
NDL SAFETY ECLIPSE 18X1.5 (NEEDLE) ×2 IMPLANT
NEEDLE HYPO 18GX1.5 SHARP (NEEDLE) ×2
NEEDLE HYPO 25X1 1.5 SAFETY (NEEDLE) ×4 IMPLANT
NS IRRIG 1000ML POUR BTL (IV SOLUTION) ×4 IMPLANT
PACK BASIN DAY SURGERY FS (CUSTOM PROCEDURE TRAY) ×4 IMPLANT
PENCIL BUTTON HOLSTER BLD 10FT (ELECTRODE) ×4 IMPLANT
PIN SAFETY STERILE (MISCELLANEOUS) IMPLANT
SLEEVE SCD COMPRESS KNEE MED (MISCELLANEOUS) ×4 IMPLANT
SPONGE GAUZE 4X4 12PLY STER LF (GAUZE/BANDAGES/DRESSINGS) IMPLANT
SPONGE LAP 18X18 X RAY DECT (DISPOSABLE) IMPLANT
SPONGE LAP 4X18 X RAY DECT (DISPOSABLE) ×12 IMPLANT
STAPLER VISISTAT 35W (STAPLE) ×4 IMPLANT
STOCKINETTE IMPERVIOUS LG (DRAPES) IMPLANT
STRIP CLOSURE SKIN 1/2X4 (GAUZE/BANDAGES/DRESSINGS) IMPLANT
SUT MNCRL AB 4-0 PS2 18 (SUTURE) ×8 IMPLANT
SUT MON AB 5-0 PS2 18 (SUTURE) IMPLANT
SUT SILK 2 0 SH (SUTURE) IMPLANT
SUT VIC AB 2-0 SH 27 (SUTURE) ×6
SUT VIC AB 2-0 SH 27XBRD (SUTURE) ×6 IMPLANT
SUT VIC AB 3-0 SH 27 (SUTURE) ×6
SUT VIC AB 3-0 SH 27X BRD (SUTURE) ×6 IMPLANT
SUT VIC AB 5-0 PS2 18 (SUTURE) IMPLANT
SUT VICRYL AB 3 0 TIES (SUTURE) IMPLANT
SYR CONTROL 10ML LL (SYRINGE) ×8 IMPLANT
TOWEL OR 17X24 6PK STRL BLUE (TOWEL DISPOSABLE) ×8 IMPLANT
TOWEL OR NON WOVEN STRL DISP B (DISPOSABLE) IMPLANT
TUBE CONNECTING 20'X1/4 (TUBING) ×1
TUBE CONNECTING 20X1/4 (TUBING) ×3 IMPLANT
YANKAUER SUCT BULB TIP NO VENT (SUCTIONS) ×4 IMPLANT

## 2013-03-10 NOTE — Anesthesia Postprocedure Evaluation (Signed)
  Anesthesia Post-op Note  Patient: Elizabeth Miles  Procedure(s) Performed: Procedure(s): BREAST LUMPECTOMY WITH NEEDLE LOCALIZATION AND AXILLARY SENTINEL LYMPH NODE BIOPSY (Right) REMOVAL PORT-A-CATH (Left)  Patient Location: PACU  Anesthesia Type:General  Level of Consciousness: awake, alert  and oriented  Airway and Oxygen Therapy: Patient Spontanous Breathing  Post-op Pain: moderate  Post-op Assessment: Post-op Vital signs reviewed  Post-op Vital Signs: Reviewed  Complications: No apparent anesthesia complications

## 2013-03-10 NOTE — Progress Notes (Signed)
Radiology staff performed nuc med injection. Versed and fentanyl given for sedation and VSS (see doc flowsheet). Family called to bedside and emotional support provided to all.

## 2013-03-10 NOTE — Op Note (Signed)
Preoperative diagnosis: clinical stage II right breast cancer s/p primary chemotherapy Postoperative diagnosis: same as above Procedure:  #1 right breast wire-guided lumpectomy with partial excision of areola #2 right axillary sentinel lymph node biopsy #3 injection of blue dye for sentinel node identification #4 left subclavian port removal Surgeon: Dr. Serita Grammes Anesthesia: Gen. With LMA Estimated blood loss: Minimal Drains: None Complications: None Specimens: #1 right breast tissue marked with paint #2 right axillary sentinel node  #3 right retroareolar tissue Disposition to recovery stable Sponge and needle count correct  Indications: This is a 48 year old female who I saw initially with a clinical stage II right breast cancer. We decided to begin her on primary systemic chemotherapy in order to facilitate breast conservation therapy. She had a fairly reasonable response from this.  She has had some issues during chemotherapy and now appears to have sciatica. We discussed all of her different options and we've decided to proceed with a lumpectomy and sentinel lymph node biopsy. This was very close to her nipple areolar complex to begin with. She very much would like to preserve her nipple so we decided to remove a portion of her areola and see if we can save her nipple. We also discussed port removal.  Procedure: After informed consent was obtained the patient was first taken to the breast center. She had a wire placed by Dr. Radford Pax and I discussed this with her prior to beginning the surgery. I had the mammograms in the operating room. She was given cefazolin. Sequential compression devices on her legs. She was placed under general anesthesia with an LMA. Her breast and axilla were then prepped and draped in the standard sterile surgical fashion. Her port site was also prepped and draped in the standard sterile surgical fashion. Surgical timeout was performed.  I first removed her  port. I excised her old scar. I then used cautery to dissect and identify the port. This was removed in its entirety. Hemostasis was then obtained. I closed this with 3-0 Vicryl, 4-0 Monocryl, and Dermabond. I  injected methylene blue around the areolar complex and massaged this for 2 minutes. I then removed the breast tumor. I made a crescent-shaped incision that encompassed a portion of the areola overlying the tumor. I then used cautery to remove the wire and the surrounding tissue with an attempt to get a clear margin around all of this. I did a mammogram and this confirmed removal of the area. This was confirmed by radiology. I then removed the retroareolar tissue and painted this. This was sent for intraoperative evaluation it did not appear to have any tumor present. I did remove a little bit more of the lateral margin and marked this with sutures short superior, long lateral, and double deep. It appeared this margin might have been close. Pathology thought that my margins were otherwise clear. I then obtained hemostasis. I placed 2 clips deep and one clip in each of the positions around the cavity. I then closed this with 2-0 Vicryl, 3-0 Vicryl, 4 Monocryl. Placed Dermabond and Steri-Strips.  I then identified the location of the sentinel node. I then dissected through the axillary fascia. There was a small bundle of what appeared to be 2 or 3 small sentinel nodes. 2 of these also were blue. I then removed these and there was really no background radioactivity. These were then passed off the table. I obtained hemostasis and closed this with 2-0 Vicryl, 3-0 Vicryl, and 4 Monocryl. Steri-Strips and a sterile dressing  were placed. A breast binder was placed. She was extubated having tolerated this well.

## 2013-03-10 NOTE — Interval H&P Note (Signed)
History and Physical Interval Note:  03/10/2013 12:50 PM  Elizabeth Miles  has presented today for surgery, with the diagnosis of right breast cancer  The various methods of treatment have been discussed with the patient and family. After consideration of risks, benefits and other options for treatment, the patient has consented to  Procedure(s): BREAST LUMPECTOMY WITH NEEDLE LOCALIZATION AND AXILLARY SENTINEL LYMPH NODE BX (Right) as a surgical intervention .  The patient's history has been reviewed, patient examined, no change in status, stable for surgery.  I have reviewed the patient's chart and labs.  Questions were answered to the patient's satisfaction.     Elizabeth Miles

## 2013-03-10 NOTE — Interval H&P Note (Signed)
History and Physical Interval Note:  03/10/2013 12:51 PM  Elizabeth Miles  has presented today for surgery, with the diagnosis of right breast cancer  The various methods of treatment have been discussed with the patient and family. After consideration of risks, benefits and other options for treatment, the patient has consented to  Procedure(s): BREAST LUMPECTOMY WITH NEEDLE LOCALIZATION AND AXILLARY SENTINEL LYMPH NODE BX (Right) as a surgical intervention .  The patient's history has been reviewed, patient examined, no change in status, stable for surgery.  I have reviewed the patient's chart and labs.  Questions were answered to the patient's satisfaction.   Will also remove port today  Cherika Jessie

## 2013-03-10 NOTE — Anesthesia Procedure Notes (Signed)
Procedure Name: LMA Insertion Date/Time: 03/10/2013 1:07 PM Performed by: Lieutenant Diego Pre-anesthesia Checklist: Patient identified, Emergency Drugs available, Suction available and Patient being monitored Patient Re-evaluated:Patient Re-evaluated prior to inductionOxygen Delivery Method: Circle System Utilized Preoxygenation: Pre-oxygenation with 100% oxygen Intubation Type: IV induction Ventilation: Mask ventilation without difficulty LMA: LMA inserted LMA Size: 4.0 Number of attempts: 1 Airway Equipment and Method: bite block Placement Confirmation: positive ETCO2 and breath sounds checked- equal and bilateral Tube secured with: Tape Dental Injury: Teeth and Oropharynx as per pre-operative assessment

## 2013-03-10 NOTE — H&P (View-Only) (Signed)
Subjective:     Patient ID: Elizabeth Miles, female   DOB: [birth year], 48 y.o.   MRN: [id number]  HPI 75 yof who I saw initially in July with palpable right breast mass that was larger with a satellite on mri. This is er pos, pr neg, and her2 not amplified. We decided to do primary chemotherapy in order to make lumpectomy better option. I thought initially that I would need to take nipple and areola. She has no completed her chemotherapy. She has undergone recent mr that shows marked interval decreased with the area measuring 2 x 2.5x 2.7 cm including the primary mass and the satellite lesion. There is no abnormal enhancement of the right nipple or any skin thickening or enhancement. The left breast and nodes are all normal.  I saw her recently and we planned for right breast wire guided lumpectomy, right axillary sentinel node biopsy and port removal.  See below for discussion of performance of lumpectomy.  She would very much like to try to save nipple.   Review of Systems     Objective:   Physical Exam deferred    Assessment:     Right breast cancer s/p primary chemotherapy    Plan:     Right breast wire guided lumpectomy, partial excision of areola, biopsy nipple, right axillary sentinel node biopsy, port removal  We discussed a sentinel lymph node biopsy as she still does not appear to having lymph node involvement right. We discussed the performance of that with injection of radioactive tracer and blue dye. We discussed that she would have an incision underneath her axillary hairline. We discussed that there is a chance of having a positive node with a sentinel lymph node biopsy and we will await the permanent pathology to make any other first further decisions in terms of her treatment. One of these options might be to return to the operating room to perform an axillary lymph node dissection. We discussed about a 1-5% risk lifetime of chronic shoulder pain as well as lymphedema associated  with a sentinel lymph node biopsy.  We plan for lumpectomy. I think that is reasonable given breast size and response now. The question is whether to remove the nipple. Clinically and by mr it appears to be off the nipple. We discussed a central lumpectomy but decided we would try to save nipple. We will plan on wire guided lumpectomy with removal of a portion of her areola. I will send intraoperative path below nipple and if positive will excise. If negative then will just excise areola and await permanent path. She knows there is chance of returning to or for nipple removal or further surgery for margins. She also understands there is risk of decreased sensation. Plan for surgery next week.  She has normal labs and continues to get better s/p chemo.   We discussed the risks of operation including bleeding, infection, possible reoperation.

## 2013-03-10 NOTE — Transfer of Care (Signed)
Immediate Anesthesia Transfer of Care Note  Patient: Elizabeth Miles  Procedure(s) Performed: Procedure(s): BREAST LUMPECTOMY WITH NEEDLE LOCALIZATION AND AXILLARY SENTINEL LYMPH NODE BIOPSY (Right) REMOVAL PORT-A-CATH (Left)  Patient Location: PACU  Anesthesia Type:General  Level of Consciousness: awake  Airway & Oxygen Therapy: Patient Spontanous Breathing and Patient connected to face mask oxygen  Post-op Assessment: Report given to PACU RN and Post -op Vital signs reviewed and stable  Post vital signs: Reviewed and stable  Complications: No apparent anesthesia complications

## 2013-03-10 NOTE — Discharge Instructions (Signed)
[facility] °Office Phone Number [phone] ° °BREAST BIOPSY/ PARTIAL MASTECTOMY: POST OP INSTRUCTIONS ° °Always review your discharge instruction sheet given to you by the facility where your surgery was performed. ° °IF YOU HAVE DISABILITY OR FAMILY LEAVE FORMS, YOU MUST BRING THEM TO THE OFFICE FOR PROCESSING.  DO NOT GIVE THEM TO YOUR DOCTOR. ° °1. A prescription for pain medication may be given to you upon discharge.  Take your pain medication as prescribed, if needed.  If narcotic pain medicine is not needed, then you may take acetaminophen (Tylenol), naprosyn (Alleve) or ibuprofen (Advil) as needed. °2. Take your usually prescribed medications unless otherwise directed °3. If you need a refill on your pain medication, please contact your pharmacy.  They will contact our office to request authorization.  Prescriptions will not be filled after 5pm or on week-ends. °4. You should eat very light the first 24 hours after surgery, such as soup, crackers, pudding, etc.  Resume your normal diet the day after surgery. °5. Most patients will experience some swelling and bruising in the breast.  Ice packs and a good support bra will help.  Wear the breast binder provided or a sports bra for 72 hours day and night.  After that wear a sports bra during the day until you return to the office. Swelling and bruising can take several days to resolve.  °6. It is common to experience some constipation if taking pain medication after surgery.  Increasing fluid intake and taking a stool softener will usually help or prevent this problem from occurring.  A mild laxative (Milk of Magnesia or Miralax) should be taken according to package directions if there are no bowel movements after 48 hours. °7. Unless discharge instructions indicate otherwise, you may remove your bandages 48 hours after surgery and you may shower at that time.  You may have steri-strips (small skin tapes) in place directly over the incision.   These strips should be left on the skin for 7-10 days and will come off on their own.  If your surgeon used skin glue on the incision, you may shower in 24 hours.  The glue will flake off over the next 2-3 weeks.  Any sutures or staples will be removed at the office during your follow-up visit. °8. ACTIVITIES:  You may resume regular daily activities (gradually increasing) beginning the next day.  Wearing a good support bra or sports bra minimizes pain and swelling.  You may have sexual intercourse when it is comfortable. °a. You may drive when you no longer are taking prescription pain medication, you can comfortably wear a seatbelt, and you can safely maneuver your car and apply brakes. °b. RETURN TO WORK:  ______________________________________________________________________________________ °9. You should see your doctor in the office for a follow-up appointment approximately two weeks after your surgery.  Your doctor’s nurse will typically make your follow-up appointment when she calls you with your pathology report.  Expect your pathology report 3-4 business days after your surgery.  You may call to check if you do not hear from us after three days. °10. OTHER INSTRUCTIONS: _______________________________________________________________________________________________ _____________________________________________________________________________________________________________________________________ °_____________________________________________________________________________________________________________________________________ °_____________________________________________________________________________________________________________________________________ ° °WHEN TO CALL DR WAKEFIELD: °1. Fever over 101.0 °2. Nausea and/or vomiting. °3. Extreme swelling or bruising. °4. Continued bleeding from incision. °5. Increased pain, redness, or drainage from the incision. ° °The clinic staff is available to  answer your questions during regular business hours.  Please don’t hesitate to call and ask to speak to one of the nurses for   clinical concerns.  If you have a medical emergency, go to the nearest emergency room or call 911.  A surgeon from [facility] is always on call at the hospital. ° °For further questions, please visit [email or web address] ° °Post Anesthesia Home Care Instructions ° °Activity: °Get plenty of rest for the remainder of the day. A responsible adult should stay with you for 24 hours following the procedure.  °For the next 24 hours, DO NOT: °-Drive a car °-Operate machinery °-Drink alcoholic beverages °-Take any medication unless instructed by your physician °-Make any legal decisions or sign important papers. ° °Meals: °Start with liquid foods such as gelatin or soup. Progress to regular foods as tolerated. Avoid greasy, spicy, heavy foods. If nausea and/or vomiting occur, drink only clear liquids until the nausea and/or vomiting subsides. Call your physician if vomiting continues. ° °Special Instructions/Symptoms: °Your throat may feel dry or sore from the anesthesia or the breathing tube placed in your throat during surgery. If this causes discomfort, gargle with warm salt water. The discomfort should disappear within 24 hours. ° °

## 2013-03-10 NOTE — Anesthesia Preprocedure Evaluation (Signed)
Anesthesia Evaluation  Patient identified by MRN, date of birth, ID band Patient awake    Reviewed: Allergy & Precautions, H&P , NPO status , Patient's Chart, lab work & pertinent test results  History of Anesthesia Complications (+) PROLONGED EMERGENCE and history of anesthetic complications  Airway Mallampati: II TM Distance: >3 FB Neck ROM: full    Dental  (+) Teeth Intact and Dental Advidsory Given   Pulmonary neg pulmonary ROS,  breath sounds clear to auscultation        Cardiovascular negative cardio ROS  Rhythm:regular Rate:Normal     Neuro/Psych negative neurological ROS  negative psych ROS   GI/Hepatic Neg liver ROS, Medicated and Controlled,  Endo/Other  negative endocrine ROS  Renal/GU negative Renal ROS     Musculoskeletal   Abdominal   Peds  Hematology   Anesthesia Other Findings   Reproductive/Obstetrics negative OB ROS                           Anesthesia Physical Anesthesia Plan  ASA: II  Anesthesia Plan: MAC   Post-op Pain Management:    Induction:   Airway Management Planned:   Additional Equipment:   Intra-op Plan:   Post-operative Plan:   Informed Consent: I have reviewed the patients History and Physical, chart, labs and discussed the procedure including the risks, benefits and alternatives for the proposed anesthesia with the patient or authorized representative who has indicated his/her understanding and acceptance.   Dental Advisory Given  Plan Discussed with: Anesthesiologist, CRNA and Surgeon  Anesthesia Plan Comments:         Anesthesia Quick Evaluation

## 2013-03-12 ENCOUNTER — Encounter (HOSPITAL_BASED_OUTPATIENT_CLINIC_OR_DEPARTMENT_OTHER): Payer: Self-pay | Admitting: General Surgery

## 2013-03-12 ENCOUNTER — Other Ambulatory Visit: Payer: Self-pay | Admitting: Emergency Medicine

## 2013-03-12 DIAGNOSIS — C50919 Malignant neoplasm of unspecified site of unspecified female breast: Secondary | ICD-10-CM

## 2013-03-14 ENCOUNTER — Telehealth (INDEPENDENT_AMBULATORY_CARE_PROVIDER_SITE_OTHER): Payer: Self-pay

## 2013-03-14 NOTE — Telephone Encounter (Signed)
Patient states DR. Donne Hazel called her with no message , Please advise

## 2013-03-14 NOTE — Telephone Encounter (Signed)
LMOM for pt to call me back b/c Dr Donne Hazel left a message that he tried to call the pt last night. I want to give her the good news that path was negative along with margins and nodes per Dr Donne Hazel.

## 2013-03-17 ENCOUNTER — Ambulatory Visit (HOSPITAL_BASED_OUTPATIENT_CLINIC_OR_DEPARTMENT_OTHER): Payer: BC Managed Care – PPO | Admitting: Oncology

## 2013-03-17 ENCOUNTER — Other Ambulatory Visit (HOSPITAL_BASED_OUTPATIENT_CLINIC_OR_DEPARTMENT_OTHER): Payer: BC Managed Care – PPO

## 2013-03-17 ENCOUNTER — Encounter: Payer: Self-pay | Admitting: Oncology

## 2013-03-17 ENCOUNTER — Ambulatory Visit (HOSPITAL_COMMUNITY)
Admission: RE | Admit: 2013-03-17 | Discharge: 2013-03-17 | Disposition: A | Discharge status: Home / Self Care | Payer: BC Managed Care – PPO | Source: Ambulatory Visit | Attending: Oncology | Admitting: Oncology

## 2013-03-17 ENCOUNTER — Telehealth: Payer: Self-pay | Admitting: Oncology

## 2013-03-17 VITALS — BP 123/84 | HR 94 | Temp 98.3°F | Resp 18 | Ht 67.0 in | Wt 207.4 lb

## 2013-03-17 DIAGNOSIS — Z170 Estrogen receptor positive status [ER+]: Secondary | ICD-10-CM

## 2013-03-17 DIAGNOSIS — C50919 Malignant neoplasm of unspecified site of unspecified female breast: Secondary | ICD-10-CM

## 2013-03-17 DIAGNOSIS — M25559 Pain in unspecified hip: Secondary | ICD-10-CM

## 2013-03-17 DIAGNOSIS — M76899 Other specified enthesopathies of unspecified lower limb, excluding foot: Secondary | ICD-10-CM | POA: Insufficient documentation

## 2013-03-17 DIAGNOSIS — M25859 Other specified joint disorders, unspecified hip: Secondary | ICD-10-CM | POA: Insufficient documentation

## 2013-03-17 DIAGNOSIS — C50419 Malignant neoplasm of upper-outer quadrant of unspecified female breast: Secondary | ICD-10-CM

## 2013-03-17 LAB — CBC WITH DIFFERENTIAL/PLATELET
BASO%: 0.9 % (ref 0.0–2.0)
Basophils Absolute: 0 10*3/uL (ref 0.0–0.1)
EOS%: 8.8 % — ABNORMAL HIGH (ref 0.0–7.0)
Eosinophils Absolute: 0.4 10*3/uL (ref 0.0–0.5)
HCT: 40.9 % (ref 34.8–46.6)
HGB: 13.8 g/dL (ref 11.6–15.9)
LYMPH%: 22.5 % (ref 14.0–49.7)
MCH: 29.9 pg (ref 25.1–34.0)
MCHC: 33.8 g/dL (ref 31.5–36.0)
MCV: 88.5 fL (ref 79.5–101.0)
MONO#: 0.6 10*3/uL (ref 0.1–0.9)
MONO%: 12.6 % (ref 0.0–14.0)
NEUT#: 2.7 10*3/uL (ref 1.5–6.5)
NEUT%: 55.2 % (ref 38.4–76.8)
Platelets: 285 10*3/uL (ref 145–400)
RBC: 4.62 10*6/uL (ref 3.70–5.45)
RDW: 14.3 % (ref 11.2–14.5)
WBC: 4.8 10*3/uL (ref 3.9–10.3)
lymph#: 1.1 10*3/uL (ref 0.9–3.3)

## 2013-03-17 LAB — COMPREHENSIVE METABOLIC PANEL (CC13)
ALT: 27 U/L (ref 0–55)
AST: 19 U/L (ref 5–34)
Albumin: 3.9 g/dL (ref 3.5–5.0)
Alkaline Phosphatase: 59 U/L (ref 40–150)
Anion Gap: 10 mEq/L (ref 3–11)
BUN: 10.1 mg/dL (ref 7.0–26.0)
CALCIUM: 9.7 mg/dL (ref 8.4–10.4)
CHLORIDE: 103 meq/L (ref 98–109)
CO2: 27 mEq/L (ref 22–29)
CREATININE: 0.8 mg/dL (ref 0.6–1.1)
GLUCOSE: 97 mg/dL (ref 70–140)
Potassium: 4.1 mEq/L (ref 3.5–5.1)
Sodium: 139 mEq/L (ref 136–145)
Total Bilirubin: 0.5 mg/dL (ref 0.20–1.20)
Total Protein: 7 g/dL (ref 6.4–8.3)

## 2013-03-17 NOTE — Telephone Encounter (Signed)
LM w/pt's husband for pt to call me back so I can give her the good news on path report for Dr Donne Hazel since he was not able to speak to pt last week.

## 2013-03-17 NOTE — Progress Notes (Signed)
Elizabeth Miles  Telephone:([phone]) [phone] Fax:([phone]) [phone]  OFFICE PROGRESS NOTE  ID: Elizabeth Miles   DOB: 05/26/1965  MR#: [id number]  PTW#:[id number]   PCP: San Morelle, MD [place] PCP:  Margaretha Sheffield, MD   SU:  Rolm Bookbinder, MD RAD ONC:   Thea Silversmith, MD  DIAGNOSIS:  Elizabeth Miles is a 48 y.o.  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  STAGE:  Right breast invasive ductal carcinoma Stage II, T2 Nx Mx  ER +, PR -, Her2/Neu -, Ki-67 high   PRIOR THERAPY: #1 Normal mammogram in 07/2011. On physical exam however the possibility of a cyst was noted in the right breast. In 07/2012 patient noted on exam another lump in the right breast. She underwent a diagnostic mammogram on 08/06/2012 that showed a right breast nodule in the outer quadrant. She had an ultrasound performed that showed at the 9:30 o'clock position a 3.6 cm area, and a 10:00 position a 1.3 cm area with a total area measuring between 5-6 cm.  Right breast needle core biopsy on 08/06/2012  performed in [place], [place] showed pathology  that revealed an intermediate grade invasive ductal carcinoma. (This is been confirmed by our pathology as well).  The tumor was estrogen receptor positive strongly (100%), progesterone receptor negative,  HER-2/neu negative, with a Ki-67 that showed a high proliferation rate.  #2 Elizabeth Miles  was seen by Dr. Humphrey Rolls on 08/22/2012 to discuss treatment options. Due to the size of the tumor Dr. Humphrey Rolls recommended that she proceed with neoadjuvant treatment initially consisting of chemotherapy. However due to the workup being incomplete I did recommend that she undergo MRI of the breasts as well as staging studies.   #3 Bilateral breast MRI on 08/27/2012 revealed in the right upper quadrant irregular lobulated mass with a satellite nodule or lobulation within 2 mm at its superior aspect, measured together as 4.5 x  4.0 x 3.8 cm.  There was extension of enhancement to the nipple, suggesting nipple involvement may be present.  No lymphadenopathy was noted there was no any other area of abnormal enhancement in either breast (clinical stage IIA, T2 N0).  #4 PET scan performed on 08/29/2012 revealed the primary breast cancer measuring 3.2 x 3.3 cm with SUV of 16. It was adjacent nodule along the superiomedial border of the primary mass measuring 1.4 cm which was also hypermetabolic. There were no additional areas of abnormal hypermetabolism.  No abnormal hypermetabolic activity was seen in the chest, abdomen/pelvis,within the liver, pancreas, adrenal glands or spleen. No hypermetabolic lymph nodes. In the skeleton, no focal hypermetabolic activity to suggest skeletal metastasis was seen.  #5 Patient also seen by Dr. Rolm Bookbinder and Dr. Thea Silversmith in consultation. Dr. Donne Hazel  place the Port-A-Cath on 09/12/2012.  Patient attended a chemotherapy teaching class.   #6 Completed neoadjuvant chemotherapy consisting of Q14 day Adriamycin/Cytoxan x 4 cycles on 10/25/2012.  #7 Received one dose of neoadjuvant chemotherapy with Taxol on 11/08/2012.  She developed significant grade 2 neuropathy and Taxol was discontinued.  #8 s/p  neoadjuvant chemotherapy consisting of single agent Abraxane given on day 1, 8, 15 of each 28 day cycle.  She completed therapy on 11/15/12 - 02/14/13.    #9 patient is now status post right breast lumpectomy with sentinel lymph node biopsy on 03/10/13. The final pathology revealed:  Diagnosis 1. Breast, lumpectomy, Right - INVASIVE GRADE II DUCTAL CARCINOMA WITH MICROCALCIFICATIONS AND FOCAL  ASSOCIATED MUCIN, SPANNING 2.1 CM IN GREATEST DIMENSION. - LYMPH/VASCULAR INVASION IS IDENTIFIED. - MARGINS ARE NEGATIVE. - SEE ONCOLOGY TEMPLATE. 1 of 4 FINAL for Elizabeth Miles, Elizabeth Miles ([id number]) Diagnosis(continued) 2. Breast, excision, Right retro-areolar tissue, anterior margin - BENIGN BREAST  PARENCHYMA WITH FIBROCYSTIC CHANGES. - NO ATYPIA, HYPERPLASIA, OR MALIGNANCY IDENTIFIED. 3. Breast, excision, Right additional lateral margin - BENIGN BREAST PARENCHYMA WITH FIBROCYSTIC CHANGES. - NO ATYPIA, HYPERPLASIA, OR MALIGNANCY IDENTIFIED. 4. Lymph node, sentinel, biopsy, Right axillary #1 - ONE BENIGN LYMPH NODE WITH NO TUMOR SEEN (0/1).   CURRENT THERAPY:  Proceed with radiation therapy  INTERVAL HISTORY: Elizabeth Miles is a  48 y.o. female who returns for post lumpectomy. She also was seen due to right hip pain. She was seen by her PCP and it was felt at this hip pain was secondary to sciatic nerve. I have suggested getting an x-ray of this. We also discussed her pathology in detail today. She did have a residual 2.1 cm tumor grade 2 ER positive. One sentinel node was negative for metastatic disease. Clinically otherwise from breast cancer perspective patient seems to be doing well. She denies any headaches double vision blurring of vision no fevers chills or night sweats. She has no residual peripheral paresthesias. Remainder of the 10 point review of systems is negative.     MEDICAL HISTORY: Past Medical History  Diagnosis Date  . 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 GERD (gastroesophageal reflux disease)   . GERD (gastroesophageal reflux disease) 08/22/2012  . Allergy   . Complication of anesthesia     1986 ; problem waking up  . Anxiety   . Wears glasses     ALLERGIES:   Allergies  Allergen Reactions  . Darvocet [Propoxyphene N-Acetaminophen] Shortness Of Breath and Swelling  . Shellfish Allergy Shortness Of Breath and Swelling  . Other     CT dye, "chest hurt" "makes me feel cold"     MEDICATIONS:  Current Outpatient Prescriptions  Medication Sig Dispense Refill  . acetaminophen (TYLENOL) 500 MG tablet Take 1,000 mg by mouth every 6 (six) hours as needed.      Marland Kitchen Dexlansoprazole (DEXILANT) 30 MG capsule Take 1 capsule (30 mg total) by mouth  daily.  90 capsule  4  . hyaluronate sodium (RADIAPLEXRX) GEL Apply 1 application topically once. Apply after rad txs and bedtime,prn      . oxyCODONE-acetaminophen (PERCOCET) 10-325 MG per tablet Take 1 tablet by mouth every 6 (six) hours as needed for pain.  20 tablet  0  . oxyCODONE-acetaminophen (PERCOCET) 5-325 MG per tablet Take 2 tablets by mouth every 4 (four) hours as needed.  20 tablet  0   No current facility-administered medications for this visit.    SURGICAL HISTORY:  Past Surgical History  Procedure Laterality Date  . Dilation and curettage of uterus  1993  . Cervical ablation  1983  . Breast biopsy Right 08/06/12  . Popliteal synovial cyst excision  1970  . Portacath placement Left 09/12/2012    Procedure: INSERTION PORT-A-CATH;  Surgeon: Rolm Bookbinder, MD;  Location: [facility];  Service: General;  Laterality: Left;  . Breast lumpectomy with needle localization and axillary sentinel lymph node bx Right 03/10/2013    Procedure: BREAST LUMPECTOMY WITH NEEDLE LOCALIZATION AND AXILLARY SENTINEL LYMPH NODE BIOPSY;  Surgeon: Rolm Bookbinder, MD;  Location: [facility];  Service: General;  Laterality: Right;  . Port-a-cath removal Left 03/10/2013    Procedure: REMOVAL PORT-A-CATH;  Surgeon: Rolm Bookbinder, MD;  Location: [facility];  Service: General;  Laterality: Left;    REVIEW OF SYSTEMS:  A 10 point review of systems was completed and is negative except as noted above.  PHYSICAL EXAMINATION: Blood pressure 123/84, pulse 94, temperature 98.3 F (36.8 C), temperature source Oral, resp. rate 18, height _0  (1.702 m), weight 207 lb 6.4 oz (94.076 kg). Body mass index is 32.48 kg/(m^2). General: Patient is a well appearing female in no acute distress HEENT: PERRLA, sclerae anicteric no conjunctival pallor, MMM Neck: supple, no palpable adenopathy Lungs: clear to auscultation bilaterally, no wheezes, rhonchi, or rales Cardiovascular: regular rate  rhythm, S1, S2, no murmurs, rubs or gallops Abdomen: Soft, non-tender, non-distended, normoactive bowel sounds, no HSM Extremities: warm and well perfused, no clubbing, cyanosis, or edema Skin: No rashes or lesions, mole on right side of neck with minimal erythema Neuro: Non-focal. ECOG PERFORMANCE STATUS: 1 - Symptomatic but completely ambulatory  LABORATORY DATA: Lab Results  Component Value Date   WBC 4.8 03/17/2013   HGB 13.8 03/17/2013   HCT 40.9 03/17/2013   MCV 88.5 03/17/2013   PLT 285 03/17/2013      Chemistry      Component Value Date/Time   NA 139 03/17/2013 1318   K 4.1 03/17/2013 1318   CO2 27 03/17/2013 1318   BUN 10.1 03/17/2013 1318   CREATININE 0.8 03/17/2013 1318      Component Value Date/Time   CALCIUM 9.7 03/17/2013 1318   ALKPHOS 59 03/17/2013 1318   AST 19 03/17/2013 1318   ALT 27 03/17/2013 1318   BILITOT 0.50 03/17/2013 1318    Pathology 03/10/13: Diagnosis 1. Breast, lumpectomy, Right - INVASIVE GRADE II DUCTAL CARCINOMA WITH MICROCALCIFICATIONS AND FOCAL ASSOCIATED MUCIN, SPANNING 2.1 CM IN GREATEST DIMENSION. - LYMPH/VASCULAR INVASION IS IDENTIFIED. - MARGINS ARE NEGATIVE. - SEE ONCOLOGY TEMPLATE. 1 of 4 FINAL for Elizabeth Miles, Elizabeth Miles ([id number]) Diagnosis(continued) 2. Breast, excision, Right retro-areolar tissue, anterior margin - BENIGN BREAST PARENCHYMA WITH FIBROCYSTIC CHANGES. - NO ATYPIA, HYPERPLASIA, OR MALIGNANCY IDENTIFIED. 3. Breast, excision, Right additional lateral margin - BENIGN BREAST PARENCHYMA WITH FIBROCYSTIC CHANGES. - NO ATYPIA, HYPERPLASIA, OR MALIGNANCY IDENTIFIED. 4. Lymph node, sentinel, biopsy, Right axillary #1 - ONE BENIGN LYMPH NODE WITH NO TUMOR SEEN (0/1). Microscopic Comment  RADIOGRAPHIC STUDIES: No results found.   ASSESSMENT: Elizabeth Miles is a 48 y.o. female diagnosed by biopsy with stage II, invasive ductal carcinoma of the right breast in 07/2012:  #1 Completed neoadjuvant chemotherapy consisting of Q14 day  Adriamycin/Cytoxan x 4 cycles on 10/25/2012. She then received one dose of neoadjuvant Taxol on 11/08/2012 unfortunately could not tolerate due to grade 2 neuropathy. The Taxol was discontinued and she was started on single agent Abraxane day 1 day 8 day 15 on a 28 day cycle for a total of 4 cycles. She received this from 11/15/2012 through 02/14/2013.  #2 patient is status post right lumpectomy with sentinel lymph node biopsy performed on 03/10/2013. Final pathology revealed a 2.1 cm grade 2 invasive ductal carcinoma with microcalcifications and associated mucin. Lymphovascular invasion was identified. All margins were negative. One sentinel node was negative for metastatic disease.  #3 right hip pain: Most likely due to sciatic nerve. However I will go ahead and get an x-ray of the hip. She has been seeing her primary care doctor for this. I do not think this is related to her breast cancer or neuropathy from her chemotherapy.  #4 Due to patient's  recent surgery and ongoing sciatic pain patient is gong to stay out of work until 04/13/2013.     PLAN:  #1 x-ray of the hip for further evaluation of pain.  #2 refer to radiation oncology  #3 patient will be seen back after completion of radiation.  All questions answered.  Elizabeth Miles and her family/friend were encouraged to contact us in the interim with any questions, concerns, or problems.  I spent 25 minutes counseling the patient face to face.  The total time spent in the appointment was 30 minutes.  Elizabeth Panning, MD Medical/Oncology [facility] [phone] (beeper) [phone] (Office)  03/17/2013, 2:10 PM

## 2013-03-17 NOTE — Patient Instructions (Signed)

## 2013-03-17 NOTE — Telephone Encounter (Signed)
pt sent to [facility] for xray and given appt schedule for march. per pt she is to have a referral to radonc in [place]. message to [place] to confirm and enter referral. pt aware she will be contacted when i hear back from [place]

## 2013-03-18 ENCOUNTER — Encounter: Payer: Self-pay | Admitting: Oncology

## 2013-03-18 ENCOUNTER — Telehealth: Payer: Self-pay | Admitting: Oncology

## 2013-03-18 NOTE — Telephone Encounter (Signed)
Pt called me back. I notified pt of her path report to be benign per Dr Donne Hazel. The pt understands.

## 2013-03-18 NOTE — Telephone Encounter (Signed)
per staff message response from [place] pt will need to have referral for radonc in [place] taken care of through Dr. Hetty Ely. s/w pt she is awarwe and will contact radonc.

## 2013-03-19 ENCOUNTER — Other Ambulatory Visit: Payer: Self-pay | Admitting: Emergency Medicine

## 2013-03-19 ENCOUNTER — Telehealth: Payer: Self-pay | Admitting: Oncology

## 2013-03-19 ENCOUNTER — Encounter: Payer: Self-pay | Admitting: Emergency Medicine

## 2013-03-19 DIAGNOSIS — C50919 Malignant neoplasm of unspecified site of unspecified female breast: Secondary | ICD-10-CM

## 2013-03-19 NOTE — Telephone Encounter (Signed)
Faxed over a copy of the referral for this pt to get his appts scheduled in [place]. S/w john at [phone]. Pt needing a rad onc consult.

## 2013-03-20 ENCOUNTER — Telehealth: Payer: Self-pay | Admitting: Oncology

## 2013-03-20 NOTE — Telephone Encounter (Signed)
Faxed pt medical records to [facility]

## 2013-03-27 ENCOUNTER — Encounter (INDEPENDENT_AMBULATORY_CARE_PROVIDER_SITE_OTHER): Payer: Self-pay | Admitting: General Surgery

## 2013-03-27 ENCOUNTER — Ambulatory Visit (INDEPENDENT_AMBULATORY_CARE_PROVIDER_SITE_OTHER): Payer: BC Managed Care – PPO | Admitting: General Surgery

## 2013-03-27 VITALS — BP 112/70 | HR 76 | Resp 18 | Ht 67.0 in | Wt 208.0 lb

## 2013-03-27 DIAGNOSIS — Z09 Encounter for follow-up examination after completed treatment for conditions other than malignant neoplasm: Secondary | ICD-10-CM

## 2013-03-27 NOTE — Patient Instructions (Signed)
      ABC CLASS After Breast Cancer Class  After Breast Cancer Class is a specially designed exercise class to assist you in a safe recovery after having breast cancer surgery.  In this class you will learn how to get back to full function whether your drains were just removed or if you had surgery a month ago.  This one-time class is held the 1st and 3rd Monday of every month from 11:00 a.m. until 12:00 noon at the [place] located at [facility].  This class is FREE and space is limited.  For more information or to register for the next available class, call [phone].  Class Goals   Understand specific stretches to improve the flexibility of your chest and shoulder.   Learn ways to safely strengthen your upper body and improve your posture.   Understand the warning signs of infection and why you may be at risk for an arm infection.   Learn about Lymphedema and prevention.  **You do not attend this class until after surgery.  Drains must be removed to participate.     Serafina Royals, PT, CLT Leone Payor, PT, CLT

## 2013-03-27 NOTE — Progress Notes (Signed)
Subjective:     Patient ID: Elizabeth Miles, female   DOB: 11-22-1965, 48 y.o.   MRN: [id number]  HPI  62 yof who underwent primary chemotherapy followed by right breast lumpectomy, right axillary snbx, and port removal.  She has done well after surgery except for back pain that preexisted this. Her pathology showed invasive grade 2 ductal carcinoma spanning 2.1 cm with LVI. Her margins are negative. I did excise the retroareolar tissue which made this negative. She has one node that is negative for any tumor. Review of Systems     Objective:   Physical Exam Healing right breast and right axillary incisions without infection Healing left chest incision status post port removal    Assessment:     Status post lumpectomy and sentinel node biopsy    Plan:     She can return to her activity as tolerated. I gave her information but after breast cancer physical therapy lymphedema class and I encouraged her to do so. She can begin radiation whenever she would like. I was only able to locate one sentinel node after primary chemotherapy. We discussed this at our multidisciplinary conference and radiation oncology recommended radiating her axilla along with her breast as well. I think this would be reasonable. I will plan on seeing her back in 4 months.

## 2013-04-03 ENCOUNTER — Other Ambulatory Visit: Payer: BC Managed Care – PPO

## 2013-04-03 ENCOUNTER — Ambulatory Visit: Payer: BC Managed Care – PPO | Admitting: Oncology

## 2013-04-04 ENCOUNTER — Other Ambulatory Visit: Payer: Self-pay | Admitting: Oncology

## 2013-04-08 ENCOUNTER — Telehealth: Payer: Self-pay | Admitting: *Deleted

## 2013-04-08 NOTE — Telephone Encounter (Signed)
Called and spoke with patient and confirmed appointment for 04/09/13 at 130 with Dr. Humphrey Rolls.

## 2013-04-09 ENCOUNTER — Telehealth: Payer: Self-pay | Admitting: *Deleted

## 2013-04-09 ENCOUNTER — Ambulatory Visit (HOSPITAL_BASED_OUTPATIENT_CLINIC_OR_DEPARTMENT_OTHER): Payer: BC Managed Care – PPO | Admitting: Oncology

## 2013-04-09 ENCOUNTER — Ambulatory Visit (HOSPITAL_COMMUNITY)
Admission: RE | Admit: 2013-04-09 | Discharge: 2013-04-09 | Disposition: A | Discharge status: Home / Self Care | Payer: BC Managed Care – PPO | Source: Ambulatory Visit | Attending: Oncology | Admitting: Oncology

## 2013-04-09 VITALS — BP 133/85 | HR 93 | Temp 97.9°F | Resp 20 | Ht 67.0 in | Wt 211.4 lb

## 2013-04-09 DIAGNOSIS — C50419 Malignant neoplasm of upper-outer quadrant of unspecified female breast: Secondary | ICD-10-CM

## 2013-04-09 DIAGNOSIS — Z170 Estrogen receptor positive status [ER+]: Secondary | ICD-10-CM

## 2013-04-09 DIAGNOSIS — C50919 Malignant neoplasm of unspecified site of unspecified female breast: Secondary | ICD-10-CM

## 2013-04-09 DIAGNOSIS — M545 Low back pain, unspecified: Secondary | ICD-10-CM | POA: Insufficient documentation

## 2013-04-09 DIAGNOSIS — R079 Chest pain, unspecified: Secondary | ICD-10-CM | POA: Insufficient documentation

## 2013-04-09 DIAGNOSIS — R05 Cough: Secondary | ICD-10-CM | POA: Insufficient documentation

## 2013-04-09 DIAGNOSIS — R059 Cough, unspecified: Secondary | ICD-10-CM | POA: Insufficient documentation

## 2013-04-09 DIAGNOSIS — M549 Dorsalgia, unspecified: Secondary | ICD-10-CM

## 2013-04-09 NOTE — Telephone Encounter (Signed)
Per staff message and POF I have scheduled appts.  JMW  

## 2013-04-09 NOTE — Telephone Encounter (Signed)
appts made and printed. Pt is aware that tx will be added. i emailed MW to add the tx. Pt is aware to go have cxr/ thoracolumbar...td

## 2013-04-09 NOTE — Telephone Encounter (Signed)
As noted below by Dr. Humphrey Rolls, I informed patient that the xray of chest and spine look good. Patient verbalized understanding.

## 2013-04-09 NOTE — Patient Instructions (Signed)

## 2013-04-09 NOTE — Telephone Encounter (Signed)
Message copied by Hebert Soho on Wed Apr 09, 2013  5:18 PM ------      Message from: Deatra Robinson      Created: Wed Apr 09, 2013  4:58 PM       X-ray of chest and spine look good ------

## 2013-04-13 ENCOUNTER — Encounter: Payer: Self-pay | Admitting: Oncology

## 2013-04-13 NOTE — Progress Notes (Signed)
[facility]  Telephone:(336) [phone] Fax:(336) [phone]  OFFICE PROGRESS NOTE  ID: Elizabeth Miles   DOB: 11-22-1965  MR#: [id number]  WLB#:[id number]   PCP: Maximiano Coss, MD [place] PCP:  Georgiann Mccoy, MD   SU:  Emelia Loron, MD RAD ONC:   Lurline Hare, MD  DIAGNOSIS:  Elizabeth Miles is a 48 y.o.  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  STAGE:  Breast cancer   Primary site: Breast   Staging method: AJCC 7th Edition   Clinical: Stage IIA (T2, N0, cM0) signed by Lurline Hare, MD on 08/29/2012  8:17 PM   Pathologic: Stage IIA (T2, N0, cM0) signed by Victorino December, MD on 04/13/2013  8:39 PM   Summary: Stage IIA (T2, N0, cM0) Her2Neu+ on final pathology  PRIOR THERAPY: #1 Normal mammogram in 07/2011. On physical exam however the possibility of a cyst was noted in the right breast. In 07/2012 patient noted on exam another lump in the right breast. She underwent a diagnostic mammogram on 08/06/2012 that showed a right breast nodule in the outer quadrant. She had an ultrasound performed that showed at the 9:30 o'clock position a 3.6 cm area, and a 10:00 position a 1.3 cm area with a total area measuring between 5-6 cm.  Right breast needle core biopsy on 08/06/2012  performed in [place], [place] showed pathology  that revealed an intermediate grade invasive ductal carcinoma. (This is been confirmed by our pathology as well).  The tumor was estrogen receptor positive strongly (100%), progesterone receptor negative,  HER-2/neu negative, with a Ki-67 that showed a high proliferation rate.   #2 Bilateral breast MRI on 08/27/2012 revealed in the right upper quadrant irregular lobulated mass with a satellite nodule or lobulation within 2 mm at its superior aspect, measured together as 4.5 x 4.0 x 3.8 cm.  There was extension of enhancement to the nipple, suggesting nipple involvement may be present.  No  lymphadenopathy was noted there was no any other area of abnormal enhancement in either breast (clinical stage IIA, T2 N0).  #3 PET scan performed on 08/29/2012 revealed the primary breast cancer measuring 3.2 x 3.3 cm with SUV of 16. It was adjacent nodule along the superiomedial border of the primary mass measuring 1.4 cm which was also hypermetabolic. There were no additional areas of abnormal hypermetabolism.  No abnormal hypermetabolic activity was seen in the chest, abdomen/pelvis,within the liver, pancreas, adrenal glands or spleen. No hypermetabolic lymph nodes. In the skeleton, no focal hypermetabolic activity to suggest skeletal metastasis was seen.  #4 Patient also seen by Dr. Emelia Loron and Dr. Lurline Hare in consultation. Dr. Dwain Sarna  place the Port-A-Cath on 09/12/2012.  Patient attended a chemotherapy teaching class.   #5 Completed neoadjuvant chemotherapy consisting of Q14 day Adriamycin/Cytoxan x 4 cycles on 10/25/2012.  #6 Received one dose of neoadjuvant chemotherapy with Taxol on 11/08/2012.  She developed significant grade 2 neuropathy and Taxol was discontinued.  #7 s/p  neoadjuvant chemotherapy consisting of single agent Abraxane given on day 1, 8, 15 of each 28 day cycle.  She completed therapy on 11/15/12 - 02/14/13.    #8  status post right breast lumpectomy with sentinel lymph node biopsy on 03/10/13. The final pathology revealed:  Diagnosis 1. Breast, lumpectomy, Right - INVASIVE GRADE II DUCTAL CARCINOMA WITH MICROCALCIFICATIONS AND FOCAL ASSOCIATED MUCIN, SPANNING 2.1 CM IN GREATEST DIMENSION. - LYMPH/VASCULAR INVASION IS IDENTIFIED. - MARGINS ARE NEGATIVE. -  SEE ONCOLOGY TEMPLATE. 1 of 4 FINAL for Elizabeth Miles, Elizabeth Miles ([id number]) Diagnosis(continued) 2. Breast, excision, Right retro-areolar tissue, anterior margin - BENIGN BREAST PARENCHYMA WITH FIBROCYSTIC CHANGES. - NO ATYPIA, HYPERPLASIA, OR MALIGNANCY IDENTIFIED. 3. Breast, excision, Right additional  lateral margin - BENIGN BREAST PARENCHYMA WITH FIBROCYSTIC CHANGES. - NO ATYPIA, HYPERPLASIA, OR MALIGNANCY IDENTIFIED. 4. Lymph node, sentinel, biopsy, Right axillary #1 - ONE BENIGN LYMPH NODE WITH NO TUMOR SEEN (0/1).  #9. Repeat Her2Neu testing on final pathology positive: patient recommended anti-her2 therapy with single trastuzemab beginning March 2015   CURRENT THERAPY: RT/herceptin  INTERVAL HISTORY: Elizabeth Miles is a  48 y.o. female who returns for follow up. She has been seen at [facility] to begin radiation therapy. we discussed the results of repeat her2 testing on the final pathology. It was positive, I explained the rational for repeat testing and the clinical implication of the positive results. I have reocmmended that she be treated adjuvantly with herceptin for the duration of 1 year. We discussed the side effects. Patient does tell me that she has been having some lower back pain. And she recentily had an EKG that was abnormal. She was going to have chest xray and a back xray as well. She has no headches, fevers, chills, no nausea, vomiting or diarrhea or constipation. remainder of the 10 point review of systems is negative.    MEDICAL HISTORY: Past Medical History  Diagnosis Date  . 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 GERD (gastroesophageal reflux disease)   . GERD (gastroesophageal reflux disease) 08/22/2012  . Allergy   . Complication of anesthesia     1986 ; problem waking up  . Anxiety   . Wears glasses     ALLERGIES:   Allergies  Allergen Reactions  . Darvocet [Propoxyphene N-Acetaminophen] Shortness Of Breath and Swelling  . Shellfish Allergy Shortness Of Breath and Swelling  . Other     CT dye, "chest hurt" "makes me feel cold"     MEDICATIONS:  Current Outpatient Prescriptions  Medication Sig Dispense Refill  . acetaminophen (TYLENOL) 500 MG tablet Take 1,000 mg by mouth every 6 (six) hours as needed.      Marland Kitchen Dexlansoprazole  (DEXILANT) 30 MG capsule Take 1 capsule (30 mg total) by mouth daily.  90 capsule  4  . hyaluronate sodium (RADIAPLEXRX) GEL Apply 1 application topically once. Apply after rad txs and bedtime,prn      . ibuprofen (ADVIL,MOTRIN) 800 MG tablet Take 800 mg by mouth every 8 (eight) hours as needed for moderate pain (for back pain).      Marland Kitchen oxyCODONE-acetaminophen (PERCOCET) 10-325 MG per tablet Take 1 tablet by mouth every 6 (six) hours as needed for pain.  20 tablet  0  . oxyCODONE-acetaminophen (PERCOCET) 5-325 MG per tablet Take 2 tablets by mouth every 4 (four) hours as needed.  20 tablet  0   No current facility-administered medications for this visit.    SURGICAL HISTORY:  Past Surgical History  Procedure Laterality Date  . Dilation and curettage of uterus  1993  . Cervical ablation  1983  . Breast biopsy Right 08/06/12  . Popliteal synovial cyst excision  1970  . Portacath placement Left 09/12/2012    Procedure: INSERTION PORT-A-CATH;  Surgeon: Emelia Loron, MD;  Location: [facility] OR;  Service: General;  Laterality: Left;  . Breast lumpectomy with needle localization and axillary sentinel lymph node bx Right 03/10/2013    Procedure: BREAST LUMPECTOMY WITH NEEDLE  LOCALIZATION AND AXILLARY SENTINEL LYMPH NODE BIOPSY;  Surgeon: Rolm Bookbinder, MD;  Location: [facility];  Service: General;  Laterality: Right;  . Port-a-cath removal Left 03/10/2013    Procedure: REMOVAL PORT-A-CATH;  Surgeon: Rolm Bookbinder, MD;  Location: [facility];  Service: General;  Laterality: Left;    REVIEW OF SYSTEMS:  A 10 point review of systems was completed and is negative except as noted above.  PHYSICAL EXAMINATION: Blood pressure 133/85, pulse 93, temperature 97.9 F (36.6 C), temperature source Oral, resp. rate 20, height $RemoveBe'5\' 7"'TVSJebsUz$  (1.702 m), weight 211 lb 6.4 oz (95.89 kg), last menstrual period 03/12/2012. Body mass index is 33.1 kg/(m^2). General: Patient is a well appearing  female in no acute distress HEENT: PERRLA, sclerae anicteric no conjunctival pallor, MMM Neck: supple, no palpable adenopathy Lungs: clear to auscultation bilaterally, no wheezes, rhonchi, or rales Cardiovascular: regular rate rhythm, S1, S2, no murmurs, rubs or gallops Abdomen: Soft, non-tender, non-distended, normoactive bowel sounds, no HSM Extremities: warm and well perfused, no clubbing, cyanosis, or edema Skin: No rashes or lesions, mole on right side of neck with minimal erythema Neuro: Non-focal. ECOG PERFORMANCE STATUS: 1 - Symptomatic but completely ambulatory  LABORATORY DATA: Lab Results  Component Value Date   WBC 4.8 03/17/2013   HGB 13.8 03/17/2013   HCT 40.9 03/17/2013   MCV 88.5 03/17/2013   PLT 285 03/17/2013      Chemistry      Component Value Date/Time   NA 139 03/17/2013 1318   K 4.1 03/17/2013 1318   CO2 27 03/17/2013 1318   BUN 10.1 03/17/2013 1318   CREATININE 0.8 03/17/2013 1318      Component Value Date/Time   CALCIUM 9.7 03/17/2013 1318   ALKPHOS 59 03/17/2013 1318   AST 19 03/17/2013 1318   ALT 27 03/17/2013 1318   BILITOT 0.50 03/17/2013 1318    Pathology 03/10/13: Diagnosis 1. Breast, lumpectomy, Right - INVASIVE GRADE II DUCTAL CARCINOMA WITH MICROCALCIFICATIONS AND FOCAL ASSOCIATED MUCIN, SPANNING 2.1 CM IN GREATEST DIMENSION. - LYMPH/VASCULAR INVASION IS IDENTIFIED. - MARGINS ARE NEGATIVE. - SEE ONCOLOGY TEMPLATE. 1 of 4 FINAL for Elizabeth Miles, Elizabeth Miles ([id number]) Diagnosis(continued) 2. Breast, excision, Right retro-areolar tissue, anterior margin - BENIGN BREAST PARENCHYMA WITH FIBROCYSTIC CHANGES. - NO ATYPIA, HYPERPLASIA, OR MALIGNANCY IDENTIFIED. 3. Breast, excision, Right additional lateral margin - BENIGN BREAST PARENCHYMA WITH FIBROCYSTIC CHANGES. - NO ATYPIA, HYPERPLASIA, OR MALIGNANCY IDENTIFIED. 4. Lymph node, sentinel, biopsy, Right axillary #1 - ONE BENIGN LYMPH NODE WITH NO TUMOR SEEN (0/1). Microscopic Comment  RADIOGRAPHIC  STUDIES: No results found.   ASSESSMENT/PLAN: Elizabeth Miles is a 48 y.o. female diagnosed by biopsy with stage II, invasive ductal carcinoma of the right breast in 07/2012:  #1 S/P neoadjuvant chemotherapy consisting of Q14 day Adriamycin/Cytoxan x 4 cycles on 10/25/2012. She then received one dose of neoadjuvant Taxol on 11/08/2012 unfortunately could not tolerate due to grade 2 neuropathy. The Taxol was discontinued and she was started on single agent Abraxane day 1 day 8 day 15 on a 28 day cycle for a total of 4 cycles. She received this from 11/15/2012 through 02/14/2013.  #2  status post right lumpectomy with sentinel lymph node biopsy performed on 03/10/2013. Final pathology revealed a 2.1 cm grade 2 invasive ductal carcinoma with microcalcifications and associated mucin. Lymphovascular invasion was identified. All margins were negative. One sentinel node was negative for metastatic disease.  #3 right hip pain: resolved  #4. Back pain?chest: obtain x rays  #  5. Her2Neu positive breast cancer on final path: discussed role of adjuvant herceptin, we discussed risks/benefits/rational for this. She will need echocardiogram, we will plan to do the therapy peripherally since she had her port removed. Right now she does not want another one placed.  #6 follow up: March to begin cycle 1 of Herceptin, she will also continue radiation therapy    All questions answered.  Ms. Dancer and her family/friend were encouraged to contact us in the interim with any questions, concerns, or problems.  I spent 25 minutes counseling the patient face to face.  The total time spent in the appointment was 30 minutes.  Marcy Panning, MD Medical/Oncology [facility] [phone] (beeper) [phone] (Office)

## 2013-04-23 ENCOUNTER — Ambulatory Visit (HOSPITAL_BASED_OUTPATIENT_CLINIC_OR_DEPARTMENT_OTHER)
Admission: RE | Admit: 2013-04-23 | Discharge: 2013-04-23 | Disposition: A | Discharge status: Home / Self Care | Payer: BC Managed Care – PPO | Source: Ambulatory Visit | Attending: Unknown Physician Specialty | Admitting: Unknown Physician Specialty

## 2013-04-23 ENCOUNTER — Other Ambulatory Visit (HOSPITAL_COMMUNITY): Payer: Self-pay | Admitting: Internal Medicine

## 2013-04-23 ENCOUNTER — Ambulatory Visit (HOSPITAL_COMMUNITY)
Admission: RE | Admit: 2013-04-23 | Discharge: 2013-04-23 | Disposition: A | Discharge status: Home / Self Care | Payer: BC Managed Care – PPO | Source: Ambulatory Visit | Attending: Internal Medicine | Admitting: Internal Medicine

## 2013-04-23 VITALS — BP 112/76 | HR 87 | Wt 208.5 lb

## 2013-04-23 DIAGNOSIS — C50919 Malignant neoplasm of unspecified site of unspecified female breast: Secondary | ICD-10-CM

## 2013-04-23 DIAGNOSIS — I517 Cardiomegaly: Secondary | ICD-10-CM

## 2013-04-23 DIAGNOSIS — K219 Gastro-esophageal reflux disease without esophagitis: Secondary | ICD-10-CM | POA: Insufficient documentation

## 2013-04-23 DIAGNOSIS — F411 Generalized anxiety disorder: Secondary | ICD-10-CM | POA: Insufficient documentation

## 2013-04-23 NOTE — Progress Notes (Signed)
Referring Physician: Humphrey Rolls Primary Care: [place]   HPI:  Elizabeth Miles is a 48 y/o woman with h/o GERD and R breast CA referred by Dr. Humphrey Rolls for enrollment into the cardio-oncology Clinic.  Diagnosed in 6/14 with R breast CA  (clinical stage IIA, T2 N0). Triple positive.  Completed neoadjuvant chemotherapy consisting of Q14 day Adriamycin/Cytoxan x 4 cycles on 10/25/2012. Received one dose of neoadjuvant chemotherapy with Taxol on 11/08/2012. She developed significant grade 2 neuropathy and Taxol was discontinued. s/p neoadjuvant chemotherapy consisting of single agent Abraxane given on day 1, 8, 15 of each 28 day cycle. She completed therapy on 11/15/12 - 02/14/13. Status post right breast lumpectomy with sentinel lymph node biopsy on 03/10/13 (sentinel node negative). Now on 7/33 XRT.   Denies h/o known cardiac disease. Had echo and stress test with Dr. Harmon Pier which were normal. Recently had an ECG and was told she had an abnormal T wave.   Echo 04/24/23  EF 60%. Lateral s' 8.9 cm GLS - 18.7  Denies CP, SOB, edema. 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 Has gained 45 pounds since starting chemo.   Review of Systems: [y] = yes, [ ]  = no   General: Weight gain Blue.Reese ]; Weight loss [ ] ; Anorexia [ ] ; Fatigue [ ] ; Fever [ ] ; Chills [ ] ; Weakness [ ]   Cardiac: Chest pain/pressure [ ] ; Resting SOB [ ] ; Exertional SOB [ ] ; Orthopnea [ ] ; Pedal Edema [ ] ; Palpitations [ ] ; Syncope [ ] ; Presyncope [ ] ; Paroxysmal nocturnal dyspnea[ ]   Pulmonary: Cough [ ] ; Wheezing[ ] ; Hemoptysis[ ] ; Sputum [ ] ; Snoring [ ]   GI: Vomiting[ ] ; Dysphagia[ ] ; Melena[ ] ; Hematochezia [ ] ; Heartburn[ ] ; Abdominal pain [ ] ; Constipation [ ] ; Diarrhea [ ] ; BRBPR [ ]   GU: Hematuria[ ] ; Dysuria [ ] ; Nocturia[ ]   Vascular: Pain in legs with walking [ ] ; Pain in feet with lying flat [ ] ; Non-healing sores [ ] ; Stroke [ ] ; TIA [ ] ; Slurred speech [ ] ;  Neuro: Headaches[ ] ; Vertigo[ ] ; Seizures[ ] ; Paresthesias[  ];Blurred vision [ ] ; Diplopia [ ] ; Vision changes [ ]   Ortho/Skin: Arthritis [ ] ; Joint pain [ ] ; Muscle pain [ ] ; Joint swelling [ ] ; Back Pain [ ] ; Rash [ ]   Psych: Depression[ ] ; Anxiety[ ]   Heme: Bleeding problems [ ] ; Clotting disorders [ ] ; Anemia [ ]   Endocrine: Diabetes [ ] ; Thyroid dysfunction[ ]    Past Medical History  Diagnosis Date  . 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 GERD (gastroesophageal reflux disease)   . GERD (gastroesophageal reflux disease) 08/22/2012  . Allergy   . Complication of anesthesia     1986 ; problem waking up  . Anxiety   . Wears glasses     Current Outpatient Prescriptions  Medication Sig Dispense Refill  . acetaminophen (TYLENOL) 500 MG tablet Take 1,000 mg by mouth every 6 (six) hours as needed.      Marland Kitchen Dexlansoprazole (DEXILANT) 30 MG capsule Take 1 capsule (30 mg total) by mouth daily.  90 capsule  4  . hyaluronate sodium (RADIAPLEXRX) GEL Apply 1 application topically once. Apply after rad txs and bedtime,prn      . ibuprofen (ADVIL,MOTRIN) 800 MG tablet Take 800 mg by mouth every 8 (eight) hours as needed for moderate pain (for back pain).      Marland Kitchen oxyCODONE-acetaminophen (PERCOCET) 10-325 MG per tablet Take 1 tablet by mouth every 6 (six) hours  as needed for pain.  20 tablet  0  . oxyCODONE-acetaminophen (PERCOCET) 5-325 MG per tablet Take 2 tablets by mouth every 4 (four) hours as needed.  20 tablet  0   No current facility-administered medications for this encounter.    Allergies  Allergen Reactions  . Darvocet [Propoxyphene N-Acetaminophen] Shortness Of Breath and Swelling  . Shellfish Allergy Shortness Of Breath and Swelling  . Other     CT dye, "chest hurt" "makes me feel cold"    History   Social History  . Marital Status: Married    Spouse Name: N/A    Number of Children: 3  . Years of Education: N/A   Occupational History  . Not on file.   Social History Main Topics  . Smoking status: Never Smoker   .  Smokeless tobacco: Never Used  . Alcohol Use: No  . Drug Use: No  . Sexual Activity: Yes     Comment: peri menopausal   Other Topics Concern  . Not on file   Social History Narrative  . No narrative on file    Family History  Problem Relation Age of Onset  . Hypertension Mother   . Bladder Cancer Maternal Uncle 68  . Cancer Paternal Grandmother 39    lung cancer  . Lung cancer Paternal Grandfather     dx in his 50s  . COPD Paternal Uncle     PHYSICAL EXAM: Filed Vitals:   04/23/13 1137  BP: 112/76  Pulse: 87  Weight: 208 lb 8 oz (94.575 kg)  SpO2: 98%   General:  Well appearing. No respiratory difficulty HEENT: normal Neck: supple. no JVD. Carotids 2+ bilat; no bruits. No lymphadenopathy or thryomegaly appreciated. Cor: PMI nondisplaced. Regular rate & rhythm. No rubs, gallops or murmurs. Lungs: clear Abdomen: soft, nontender, nondistended. No hepatosplenomegaly. No bruits or masses. Good bowel sounds. Extremities: no cyanosis, clubbing, rash, edema Neuro: alert & oriented x 3, cranial nerves grossly intact. moves all 4 extremities w/o difficulty. Affect pleasant.  ECG: NSR 76. Nonspecific ST-T abnormalities   ASSESSMENT & PLAN: 1. Breast CA    --Explained incidence of Herceptin cardiotoxicity (in setting of previous Adriamycin it is about 30%) and role of Cardio-oncology clinic at length. Echo images reviewed personally. All parameters stable. Reviewed signs and symptoms of HF to look for. Continue Herceptin. Follow-up with echo in 3 months. 2. Abnormal ECG     --nonspecific abnormalities. Echo ok. No further w/u needed.  Daniel Bensimhon,MD 1:11 PM

## 2013-04-23 NOTE — Progress Notes (Signed)
  Echocardiogram 2D Echocardiogram has been performed.  Shadoe Bethel FRANCES 04/23/2013, 10:50 AM

## 2013-04-23 NOTE — Patient Instructions (Signed)
We will contact you in 3 months to schedule your next appointment and echocardiogram  

## 2013-04-24 NOTE — Addendum Note (Signed)
Encounter addended by: Baljit Liebert M Roberts, CCT on: 04/24/2013  8:56 AM<BR>     Documentation filed: Charges VN

## 2013-05-01 ENCOUNTER — Other Ambulatory Visit: Payer: Self-pay | Admitting: *Deleted

## 2013-05-01 DIAGNOSIS — C50919 Malignant neoplasm of unspecified site of unspecified female breast: Secondary | ICD-10-CM

## 2013-05-02 ENCOUNTER — Other Ambulatory Visit (HOSPITAL_BASED_OUTPATIENT_CLINIC_OR_DEPARTMENT_OTHER): Payer: BC Managed Care – PPO

## 2013-05-02 ENCOUNTER — Other Ambulatory Visit: Payer: Self-pay | Admitting: Oncology

## 2013-05-02 ENCOUNTER — Encounter: Payer: Self-pay | Admitting: Oncology

## 2013-05-02 ENCOUNTER — Ambulatory Visit (HOSPITAL_BASED_OUTPATIENT_CLINIC_OR_DEPARTMENT_OTHER): Payer: BC Managed Care – PPO

## 2013-05-02 ENCOUNTER — Other Ambulatory Visit: Payer: Self-pay | Admitting: *Deleted

## 2013-05-02 ENCOUNTER — Ambulatory Visit (HOSPITAL_BASED_OUTPATIENT_CLINIC_OR_DEPARTMENT_OTHER): Payer: BC Managed Care – PPO | Admitting: Oncology

## 2013-05-02 VITALS — BP 126/84 | HR 85 | Temp 98.1°F | Resp 18 | Ht 67.0 in | Wt 210.7 lb

## 2013-05-02 DIAGNOSIS — C50919 Malignant neoplasm of unspecified site of unspecified female breast: Secondary | ICD-10-CM

## 2013-05-02 DIAGNOSIS — R079 Chest pain, unspecified: Secondary | ICD-10-CM

## 2013-05-02 DIAGNOSIS — C50419 Malignant neoplasm of upper-outer quadrant of unspecified female breast: Secondary | ICD-10-CM

## 2013-05-02 DIAGNOSIS — Z5112 Encounter for antineoplastic immunotherapy: Secondary | ICD-10-CM

## 2013-05-02 DIAGNOSIS — M549 Dorsalgia, unspecified: Secondary | ICD-10-CM

## 2013-05-02 DIAGNOSIS — Z170 Estrogen receptor positive status [ER+]: Secondary | ICD-10-CM

## 2013-05-02 LAB — CBC WITH DIFFERENTIAL/PLATELET
BASO%: 0.7 % (ref 0.0–2.0)
Basophils Absolute: 0 10*3/uL (ref 0.0–0.1)
EOS%: 3.5 % (ref 0.0–7.0)
Eosinophils Absolute: 0.2 10*3/uL (ref 0.0–0.5)
HCT: 41.2 % (ref 34.8–46.6)
HGB: 13.8 g/dL (ref 11.6–15.9)
LYMPH#: 0.8 10*3/uL — AB (ref 0.9–3.3)
LYMPH%: 18.6 % (ref 14.0–49.7)
MCH: 29.9 pg (ref 25.1–34.0)
MCHC: 33.5 g/dL (ref 31.5–36.0)
MCV: 89.4 fL (ref 79.5–101.0)
MONO#: 0.4 10*3/uL (ref 0.1–0.9)
MONO%: 10 % (ref 0.0–14.0)
NEUT#: 2.9 10*3/uL (ref 1.5–6.5)
NEUT%: 67.2 % (ref 38.4–76.8)
Platelets: 278 10*3/uL (ref 145–400)
RBC: 4.61 10*6/uL (ref 3.70–5.45)
RDW: 13.1 % (ref 11.2–14.5)
WBC: 4.3 10*3/uL (ref 3.9–10.3)
nRBC: 0 % (ref 0–0)

## 2013-05-02 LAB — COMPREHENSIVE METABOLIC PANEL (CC13)
ALBUMIN: 4.7 g/dL (ref 3.5–5.0)
ALT: 36 U/L (ref 0–55)
AST: 24 U/L (ref 5–34)
Alkaline Phosphatase: 82 U/L (ref 40–150)
Anion Gap: 12 mEq/L — ABNORMAL HIGH (ref 3–11)
BUN: 8.7 mg/dL (ref 7.0–26.0)
CO2: 25 mEq/L (ref 22–29)
Calcium: 10.2 mg/dL (ref 8.4–10.4)
Chloride: 105 mEq/L (ref 98–109)
Creatinine: 0.8 mg/dL (ref 0.6–1.1)
Glucose: 91 mg/dl (ref 70–140)
POTASSIUM: 3.9 meq/L (ref 3.5–5.1)
SODIUM: 142 meq/L (ref 136–145)
TOTAL PROTEIN: 8.3 g/dL (ref 6.4–8.3)
Total Bilirubin: 0.42 mg/dL (ref 0.20–1.20)

## 2013-05-02 MED ORDER — SODIUM CHLORIDE 0.9 % IV SOLN
Freq: Once | INTRAVENOUS | Status: AC
  Administered 2013-05-02: 15:00:00 via INTRAVENOUS

## 2013-05-02 MED ORDER — OXYCODONE-ACETAMINOPHEN 5-325 MG PO TABS: 1.0000 | ORAL_TABLET | ORAL | Status: DC | PRN

## 2013-05-02 MED ORDER — DIPHENHYDRAMINE HCL 25 MG PO CAPS
ORAL_CAPSULE | ORAL | Status: AC
  Filled 2013-05-02: qty 2

## 2013-05-02 MED ORDER — ACETAMINOPHEN 325 MG PO TABS
650.0000 mg | ORAL_TABLET | Freq: Once | ORAL | Status: AC
  Administered 2013-05-02: 650 mg via ORAL

## 2013-05-02 MED ORDER — TRASTUZUMAB CHEMO INJECTION 440 MG
8.0000 mg/kg | Freq: Once | INTRAVENOUS | Status: AC
  Administered 2013-05-02: 777 mg via INTRAVENOUS
  Filled 2013-05-02: qty 37

## 2013-05-02 MED ORDER — ACETAMINOPHEN 325 MG PO TABS
ORAL_TABLET | ORAL | Status: AC
  Filled 2013-05-02: qty 2

## 2013-05-02 MED ORDER — DIPHENHYDRAMINE HCL 25 MG PO CAPS
50.0000 mg | ORAL_CAPSULE | Freq: Once | ORAL | Status: AC
  Administered 2013-05-02: 50 mg via ORAL

## 2013-05-02 NOTE — Patient Instructions (Signed)
Proceed with herceptin today  We will see you back in 3 weeks for follow up

## 2013-05-02 NOTE — Patient Instructions (Signed)

## 2013-05-02 NOTE — Progress Notes (Signed)
[facility]  [phone] Fax:([phone]) [phone]  OFFICE PROGRESS NOTE  ID: Elizabeth Miles   DOB: 1965-07-23  MR#: [id number]  KCM#:[id number]   PCP: San Morelle, MD [place] PCP:  Margaretha Sheffield, MD   SU:  Rolm Bookbinder, MD RAD ONC:   Thea Silversmith, MD  DIAGNOSIS:  Elizabeth Miles is a 48 y.o.  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  STAGE:  Breast cancer   Primary site: Breast   Staging method: AJCC 7th Edition   Clinical: Stage IIA (T2, N0, cM0) signed by Thea Silversmith, MD on 08/29/2012  8:17 PM   Pathologic: Stage IIA (T2, N0, cM0) signed by Deatra Robinson, MD on 04/13/2013  8:39 PM   Summary: Stage IIA (T2, N0, cM0) Her2Neu+ on final pathology  PRIOR THERAPY: #1 Normal mammogram in 07/2011. On physical exam however the possibility of a cyst was noted in the right breast. In 07/2012 patient noted on exam another lump in the right breast. She underwent a diagnostic mammogram on 08/06/2012 that showed a right breast nodule in the outer quadrant. She had an ultrasound performed that showed at the 9:30 o'clock position a 3.6 cm area, and a 10:00 position a 1.3 cm area with a total area measuring between 5-6 cm.  Right breast needle core biopsy on 08/06/2012  performed in [place], [place] showed pathology  that revealed an intermediate grade invasive ductal carcinoma. (This is been confirmed by our pathology as well).  The tumor was estrogen receptor positive strongly (100%), progesterone receptor negative,  HER-2/neu negative, with a Ki-67 that showed a high proliferation rate.   #2 Bilateral breast MRI on 08/27/2012 revealed in the right upper quadrant irregular lobulated mass with a satellite nodule or lobulation within 2 mm at its superior aspect, measured together as 4.5 x 4.0 x 3.8 cm.  There was extension of enhancement to the nipple, suggesting nipple involvement may be present.  No  lymphadenopathy was noted there was no any other area of abnormal enhancement in either breast (clinical stage IIA, T2 N0).  #3 PET scan performed on 08/29/2012 revealed the primary breast cancer measuring 3.2 x 3.3 cm with SUV of 16. It was adjacent nodule along the superiomedial border of the primary mass measuring 1.4 cm which was also hypermetabolic. There were no additional areas of abnormal hypermetabolism.  No abnormal hypermetabolic activity was seen in the chest, abdomen/pelvis,within the liver, pancreas, adrenal glands or spleen. No hypermetabolic lymph nodes. In the skeleton, no focal hypermetabolic activity to suggest skeletal metastasis was seen.  #4 Patient also seen by Dr. Rolm Bookbinder and Dr. Thea Silversmith in consultation. Dr. Donne Hazel  place the Port-A-Cath on 09/12/2012.  Patient attended a chemotherapy teaching class.   #5 Completed neoadjuvant chemotherapy consisting of Q14 day Adriamycin/Cytoxan x 4 cycles on 10/25/2012.  #6 Received one dose of neoadjuvant chemotherapy with Taxol on 11/08/2012.  She developed significant grade 2 neuropathy and Taxol was discontinued.  #7 s/p  neoadjuvant chemotherapy consisting of single agent Abraxane given on day 1, 8, 15 of each 28 day cycle.  She completed therapy on 11/15/12 - 02/14/13.    #8  status post right breast lumpectomy with sentinel lymph node biopsy on 03/10/13. The final pathology revealed:  Diagnosis 1. Breast, lumpectomy, Right - INVASIVE GRADE II DUCTAL CARCINOMA WITH MICROCALCIFICATIONS AND FOCAL ASSOCIATED MUCIN, SPANNING 2.1 CM IN GREATEST DIMENSION. - LYMPH/VASCULAR INVASION IS IDENTIFIED. - MARGINS ARE NEGATIVE. -  SEE ONCOLOGY TEMPLATE. 1 of 4 FINAL for Elizabeth Miles, Elizabeth Miles ([id number]) Diagnosis(continued) 2. Breast, excision, Right retro-areolar tissue, anterior margin - BENIGN BREAST PARENCHYMA WITH FIBROCYSTIC CHANGES. - NO ATYPIA, HYPERPLASIA, OR MALIGNANCY IDENTIFIED. 3. Breast, excision, Right additional  lateral margin - BENIGN BREAST PARENCHYMA WITH FIBROCYSTIC CHANGES. - NO ATYPIA, HYPERPLASIA, OR MALIGNANCY IDENTIFIED. 4. Lymph node, sentinel, biopsy, Right axillary #1 - ONE BENIGN LYMPH NODE WITH NO TUMOR SEEN (0/1).  #9. Repeat Her2Neu testing on final pathology positive: patient recommended anti-her2 therapy with single trastuzemab beginning March 2015   CURRENT THERAPY: RT/Herceptin cycle 1  INTERVAL HISTORY: Elizabeth Miles is a  48 y.o. female who returns for follow up.  abnormal. She is here for cycle 1 of herceptin. She is doing well. She is very sad due to death of one of her friends. Today she denies any headaches double vision blurring of vision fevers chills night sweats. No shortness of breath chest pains palpitations. No abdominal pain no diarrhea or constipation. She has no easy bruising or bleeding. She has no myalgias and arthralgias. No peripheral paresthesias or gait disturbances. Remainder of the 10 point review of systems is negative.     MEDICAL HISTORY: Past Medical History  Diagnosis Date  . 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 GERD (gastroesophageal reflux disease)   . GERD (gastroesophageal reflux disease) 08/22/2012  . Allergy   . Complication of anesthesia     1986 ; problem waking up  . Anxiety   . Wears glasses     ALLERGIES:   Allergies  Allergen Reactions  . Darvocet [Propoxyphene N-Acetaminophen] Shortness Of Breath and Swelling  . Shellfish Allergy Shortness Of Breath and Swelling  . Other     CT dye, "chest hurt" "makes me feel cold"     MEDICATIONS:  Current Outpatient Prescriptions  Medication Sig Dispense Refill  . acetaminophen (TYLENOL) 500 MG tablet Take 1,000 mg by mouth every 6 (six) hours as needed.      Marland Kitchen Dexlansoprazole (DEXILANT) 30 MG capsule Take 1 capsule (30 mg total) by mouth daily.  90 capsule  4  . hyaluronate sodium (RADIAPLEXRX) GEL Apply 1 application topically once. Apply after rad txs and  bedtime,prn      . ibuprofen (ADVIL,MOTRIN) 800 MG tablet Take 800 mg by mouth every 8 (eight) hours as needed for moderate pain (for back pain).      Marland Kitchen oxyCODONE-acetaminophen (PERCOCET) 5-325 MG per tablet Take 1-2 tablets by mouth every 4 (four) hours as needed.  30 tablet  0   No current facility-administered medications for this visit.    SURGICAL HISTORY:  Past Surgical History  Procedure Laterality Date  . Dilation and curettage of uterus  1993  . Cervical ablation  1983  . Breast biopsy Right 08/06/12  . Popliteal synovial cyst excision  1970  . Portacath placement Left 09/12/2012    Procedure: INSERTION PORT-A-CATH;  Surgeon: Rolm Bookbinder, MD;  Location: [facility];  Service: General;  Laterality: Left;  . Breast lumpectomy with needle localization and axillary sentinel lymph node bx Right 03/10/2013    Procedure: BREAST LUMPECTOMY WITH NEEDLE LOCALIZATION AND AXILLARY SENTINEL LYMPH NODE BIOPSY;  Surgeon: Rolm Bookbinder, MD;  Location: [facility];  Service: General;  Laterality: Right;  . Port-a-cath removal Left 03/10/2013    Procedure: REMOVAL PORT-A-CATH;  Surgeon: Rolm Bookbinder, MD;  Location: [facility];  Service: General;  Laterality: Left;    REVIEW OF SYSTEMS:  A 10 point review of systems was completed and is negative except as noted above.  PHYSICAL EXAMINATION: Blood pressure 126/84, pulse 85, temperature 98.1 F (36.7 C), temperature source Oral, resp. rate 18, height $RemoveBe'5\' 7"'ObZqqvmJN$  (1.702 m), weight 210 lb 11.2 oz (95.573 kg), last menstrual period 03/12/2012. Body mass index is 32.99 kg/(m^2). General: Patient is a well appearing female in no acute distress HEENT: PERRLA, sclerae anicteric no conjunctival pallor, MMM Neck: supple, no palpable adenopathy Lungs: clear to auscultation bilaterally, no wheezes, rhonchi, or rales Cardiovascular: regular rate rhythm, S1, S2, no murmurs, rubs or gallops Abdomen: Soft, non-tender, non-distended,  normoactive bowel sounds, no HSM Extremities: warm and well perfused, no clubbing, cyanosis, or edema Skin: No rashes or lesions, mole on right side of neck with minimal erythema Neuro: Non-focal. ECOG PERFORMANCE STATUS: 1 - Symptomatic but completely ambulatory  LABORATORY DATA: Lab Results  Component Value Date   WBC 4.3 05/02/2013   HGB 13.8 05/02/2013   HCT 41.2 05/02/2013   MCV 89.4 05/02/2013   PLT 278 05/02/2013      Chemistry      Component Value Date/Time   NA 139 03/17/2013 1318   K 4.1 03/17/2013 1318   CO2 27 03/17/2013 1318   BUN 10.1 03/17/2013 1318   CREATININE 0.8 03/17/2013 1318      Component Value Date/Time   CALCIUM 9.7 03/17/2013 1318   ALKPHOS 59 03/17/2013 1318   AST 19 03/17/2013 1318   ALT 27 03/17/2013 1318   BILITOT 0.50 03/17/2013 1318    Pathology 03/10/13: Diagnosis 1. Breast, lumpectomy, Right - INVASIVE GRADE II DUCTAL CARCINOMA WITH MICROCALCIFICATIONS AND FOCAL ASSOCIATED MUCIN, SPANNING 2.1 CM IN GREATEST DIMENSION. - LYMPH/VASCULAR INVASION IS IDENTIFIED. - MARGINS ARE NEGATIVE. - SEE ONCOLOGY TEMPLATE. 1 of 4 FINAL for Elizabeth Miles, Elizabeth Miles ([id number]) Diagnosis(continued) 2. Breast, excision, Right retro-areolar tissue, anterior margin - BENIGN BREAST PARENCHYMA WITH FIBROCYSTIC CHANGES. - NO ATYPIA, HYPERPLASIA, OR MALIGNANCY IDENTIFIED. 3. Breast, excision, Right additional lateral margin - BENIGN BREAST PARENCHYMA WITH FIBROCYSTIC CHANGES. - NO ATYPIA, HYPERPLASIA, OR MALIGNANCY IDENTIFIED. 4. Lymph node, sentinel, biopsy, Right axillary #1 - ONE BENIGN LYMPH NODE WITH NO TUMOR SEEN (0/1). Microscopic Comment  RADIOGRAPHIC STUDIES: No results found.   ASSESSMENT/PLAN: Elizabeth Miles is a 48 y.o. female diagnosed by biopsy with stage II, invasive ductal carcinoma of the right breast in 07/2012:  #1 S/P neoadjuvant chemotherapy consisting of Q14 day Adriamycin/Cytoxan x 4 cycles on 10/25/2012. She then received one dose of neoadjuvant Taxol on  11/08/2012 unfortunately could not tolerate due to grade 2 neuropathy. The Taxol was discontinued and she was started on single agent Abraxane day 1 day 8 day 15 on a 28 day cycle for a total of 4 cycles. She received this from 11/15/2012 through 02/14/2013.  #2  status post right lumpectomy with sentinel lymph node biopsy performed on 03/10/2013. Final pathology revealed a 2.1 cm grade 2 invasive ductal carcinoma with microcalcifications and associated mucin. Lymphovascular invasion was identified. All margins were negative. One sentinel node was negative for metastatic disease.  #3 right hip pain: resolved  #4. Back pain/chest: obtain x rays  #5. Her2Neu positive breast cancer on final path: proceed with cycle 1of adjuvant Herceptin today  #6 follow up: March 27  cycle 2 of Herceptin, she will also continue radiation therapy    All questions answered.  Ms. Record and her family/friend were encouraged to contact us in the interim with any questions, concerns, or problems.  I  spent 25 minutes counseling the patient face to face.  The total time spent in the appointment was 30 minutes.  Marcy Panning, MD Medical/Oncology [facility] [phone] (beeper) [phone] (Office)

## 2013-05-05 ENCOUNTER — Telehealth: Payer: Self-pay | Admitting: *Deleted

## 2013-05-05 NOTE — Telephone Encounter (Signed)
Per staff message and POF I have scheduled appts.  JMW  

## 2013-05-06 ENCOUNTER — Telehealth: Payer: Self-pay | Admitting: *Deleted

## 2013-05-06 ENCOUNTER — Telehealth: Payer: Self-pay | Admitting: Oncology

## 2013-05-06 ENCOUNTER — Other Ambulatory Visit: Payer: Self-pay | Admitting: *Deleted

## 2013-05-06 NOTE — Telephone Encounter (Signed)
, °

## 2013-05-06 NOTE — Telephone Encounter (Signed)
Patient called and stated,"I got my first Herceptin on Friday, and then I was sick to my stomach on Saturday. Nothing happened on Sunday. Monday, I had diarrhea all day long. Is this a side effect of Herceptin.?" Patient denied fevers, vomiting, coughing, chills and feeling achy. Per Dr. Humphrey Rolls, stay hydrated and take some Imodium. I left a voice mail on her cell phone per her request, about staying hydrated and taking Imodium. Instructed her to call the office if she had any further questions.

## 2013-05-12 ENCOUNTER — Ambulatory Visit: Payer: BC Managed Care – PPO | Admitting: Oncology

## 2013-05-14 ENCOUNTER — Telehealth: Payer: Self-pay | Admitting: *Deleted

## 2013-05-14 NOTE — Telephone Encounter (Signed)
Received call from patient asking this office to call her back. Didn't give a reason for the phone call. I called and left a message on her home answering machine.

## 2013-05-15 ENCOUNTER — Telehealth: Payer: Self-pay

## 2013-05-15 NOTE — Telephone Encounter (Signed)
PT requested call back - no reason given.  LMOVM

## 2013-05-15 NOTE — Telephone Encounter (Signed)
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  RN asked and pt confirmed that she has had chicken pox in the past.  Let her know she should be immune.  Pt voiced understanding.

## 2013-05-20 ENCOUNTER — Telehealth: Payer: Self-pay | Admitting: Oncology

## 2013-05-20 NOTE — Telephone Encounter (Signed)
, °

## 2013-05-23 ENCOUNTER — Encounter: Payer: Self-pay | Admitting: Hematology and Oncology

## 2013-05-23 ENCOUNTER — Ambulatory Visit: Payer: BC Managed Care – PPO | Admitting: Oncology

## 2013-05-23 ENCOUNTER — Ambulatory Visit (HOSPITAL_BASED_OUTPATIENT_CLINIC_OR_DEPARTMENT_OTHER): Payer: BC Managed Care – PPO | Admitting: Hematology and Oncology

## 2013-05-23 ENCOUNTER — Ambulatory Visit (HOSPITAL_BASED_OUTPATIENT_CLINIC_OR_DEPARTMENT_OTHER): Payer: BC Managed Care – PPO

## 2013-05-23 ENCOUNTER — Other Ambulatory Visit: Payer: BC Managed Care – PPO

## 2013-05-23 ENCOUNTER — Other Ambulatory Visit (HOSPITAL_BASED_OUTPATIENT_CLINIC_OR_DEPARTMENT_OTHER): Payer: BC Managed Care – PPO

## 2013-05-23 VITALS — BP 127/84 | HR 86 | Temp 97.9°F | Resp 18 | Ht 67.0 in | Wt 211.8 lb

## 2013-05-23 DIAGNOSIS — G609 Hereditary and idiopathic neuropathy, unspecified: Secondary | ICD-10-CM

## 2013-05-23 DIAGNOSIS — R635 Abnormal weight gain: Secondary | ICD-10-CM

## 2013-05-23 DIAGNOSIS — Z5112 Encounter for antineoplastic immunotherapy: Secondary | ICD-10-CM

## 2013-05-23 DIAGNOSIS — C50919 Malignant neoplasm of unspecified site of unspecified female breast: Secondary | ICD-10-CM

## 2013-05-23 DIAGNOSIS — C50419 Malignant neoplasm of upper-outer quadrant of unspecified female breast: Secondary | ICD-10-CM

## 2013-05-23 LAB — COMPREHENSIVE METABOLIC PANEL (CC13)
ALT: 33 U/L (ref 0–55)
AST: 22 U/L (ref 5–34)
Albumin: 4.3 g/dL (ref 3.5–5.0)
Alkaline Phosphatase: 81 U/L (ref 40–150)
Anion Gap: 13 mEq/L — ABNORMAL HIGH (ref 3–11)
BUN: 9.7 mg/dL (ref 7.0–26.0)
CO2: 23 meq/L (ref 22–29)
Calcium: 10 mg/dL (ref 8.4–10.4)
Chloride: 106 mEq/L (ref 98–109)
Creatinine: 0.9 mg/dL (ref 0.6–1.1)
Glucose: 90 mg/dl (ref 70–140)
Potassium: 3.9 mEq/L (ref 3.5–5.1)
Sodium: 141 mEq/L (ref 136–145)
Total Bilirubin: 0.4 mg/dL (ref 0.20–1.20)
Total Protein: 8 g/dL (ref 6.4–8.3)

## 2013-05-23 LAB — CBC WITH DIFFERENTIAL/PLATELET
BASO%: 0.5 % (ref 0.0–2.0)
Basophils Absolute: 0 10*3/uL (ref 0.0–0.1)
EOS ABS: 0.1 10*3/uL (ref 0.0–0.5)
EOS%: 2.7 % (ref 0.0–7.0)
HCT: 40.9 % (ref 34.8–46.6)
HGB: 13.7 g/dL (ref 11.6–15.9)
LYMPH%: 20.3 % (ref 14.0–49.7)
MCH: 29.6 pg (ref 25.1–34.0)
MCHC: 33.5 g/dL (ref 31.5–36.0)
MCV: 88.3 fL (ref 79.5–101.0)
MONO#: 0.4 10*3/uL (ref 0.1–0.9)
MONO%: 9.7 % (ref 0.0–14.0)
NEUT#: 2.7 10*3/uL (ref 1.5–6.5)
NEUT%: 66.8 % (ref 38.4–76.8)
NRBC: 0 % (ref 0–0)
PLATELETS: 251 10*3/uL (ref 145–400)
RBC: 4.63 10*6/uL (ref 3.70–5.45)
RDW: 12.8 % (ref 11.2–14.5)
WBC: 4 10*3/uL (ref 3.9–10.3)
lymph#: 0.8 10*3/uL — ABNORMAL LOW (ref 0.9–3.3)

## 2013-05-23 MED ORDER — DIPHENHYDRAMINE HCL 25 MG PO CAPS
50.0000 mg | ORAL_CAPSULE | Freq: Once | ORAL | Status: AC
  Administered 2013-05-23: 50 mg via ORAL

## 2013-05-23 MED ORDER — SODIUM CHLORIDE 0.9 % IV SOLN
Freq: Once | INTRAVENOUS | Status: AC
  Administered 2013-05-23: 16:00:00 via INTRAVENOUS

## 2013-05-23 MED ORDER — TRASTUZUMAB CHEMO INJECTION 440 MG
6.0000 mg/kg | Freq: Once | INTRAVENOUS | Status: AC
  Administered 2013-05-23: 567 mg via INTRAVENOUS
  Filled 2013-05-23: qty 27

## 2013-05-23 MED ORDER — DIPHENHYDRAMINE HCL 25 MG PO CAPS
ORAL_CAPSULE | ORAL | Status: AC
  Filled 2013-05-23: qty 2

## 2013-05-23 MED ORDER — ACETAMINOPHEN 325 MG PO TABS
650.0000 mg | ORAL_TABLET | Freq: Once | ORAL | Status: AC
  Administered 2013-05-23: 650 mg via ORAL

## 2013-05-23 MED ORDER — ACETAMINOPHEN 325 MG PO TABS
ORAL_TABLET | ORAL | Status: AC
  Filled 2013-05-23: qty 2

## 2013-05-23 MED ORDER — TAMOXIFEN CITRATE 20 MG PO TABS: 20.0000 mg | ORAL_TABLET | Freq: Every day | ORAL | Status: DC

## 2013-05-23 NOTE — Progress Notes (Signed)
Patient does not need CMET per Dr. Owens Loffler.

## 2013-05-23 NOTE — Patient Instructions (Signed)
[facility] Discharge Instructions for Patients Receiving Chemotherapy  Today you received the following chemotherapy agents Herceptin.  To help prevent nausea and vomiting after your treatment, we encourage you to take your nausea medication as prescribed.   If you develop nausea and vomiting that is not controlled by your nausea medication, call the clinic.   BELOW ARE SYMPTOMS THAT SHOULD BE REPORTED IMMEDIATELY:  *FEVER GREATER THAN 100.5 F  *CHILLS WITH OR WITHOUT FEVER  NAUSEA AND VOMITING THAT IS NOT CONTROLLED WITH YOUR NAUSEA MEDICATION  *UNUSUAL SHORTNESS OF BREATH  *UNUSUAL BRUISING OR BLEEDING  TENDERNESS IN MOUTH AND THROAT WITH OR WITHOUT PRESENCE OF ULCERS  *URINARY PROBLEMS  *BOWEL PROBLEMS  UNUSUAL RASH Items with * indicate a potential emergency and should be followed up as soon as possible.  Feel free to call the clinic you have any questions or concerns. The clinic phone number is [phone].    

## 2013-05-23 NOTE — Progress Notes (Signed)
.  [facility]  [phone] [phone]  OFFICE PROGRESS NOTE  ID: [name]   DOB: 10/12/1965  MR#: [id number]  CSN#:[id number]   PCP: [name],[name], MD [place] PCP:  Daniel Pomposini, MD   SU:  Matthew Wakefield, MD RAD ONC:   Stacy Wentworth, MD  DIAGNOSIS:  Elizabeth Miles is a 48 y.o.  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  STAGE:  Breast cancer   Primary site: Breast   Staging method: AJCC 7th Edition   Clinical: Stage IIA (T2, N0, cM0) signed by Stacy Wentworth, MD on 08/29/2012  8:17 PM   Pathologic: Stage IIA (T2, N0, cM0) signed by Elizabeth K Khan, MD on 04/13/2013  8:39 PM   Summary: Stage IIA (T2, N0, cM0) Her2Neu+ on final pathology  PRIOR THERAPY: #1 Normal mammogram in 07/2011. On physical exam however the possibility of a cyst was noted in the right breast. In 07/2012 patient noted on exam another lump in the right breast. She underwent a diagnostic mammogram on 08/06/2012 that showed a right breast nodule in the outer quadrant. She had an ultrasound performed that showed at the 9:30 o'clock position a 3.6 cm area, and a 10:00 position a 1.3 cm area with a total area measuring between 5-6 cm.  Right breast needle core biopsy on 08/06/2012  performed in [place], [place] showed pathology  that revealed an intermediate grade invasive ductal carcinoma. (This is been confirmed by our pathology as well).  The tumor was estrogen receptor positive strongly (100%), progesterone receptor negative,  HER-2/neu negative, with a Ki-67 that showed a high proliferation rate.   #2 Bilateral breast MRI on 08/27/2012 revealed in the right upper quadrant irregular lobulated mass with a satellite nodule or lobulation within 2 mm at its superior aspect, measured together as 4.5 x 4.0 x 3.8 cm.  There was extension of enhancement to the nipple, suggesting nipple involvement may be present.  No  lymphadenopathy was noted there was no any other area of abnormal enhancement in either breast (clinical stage IIA, T2 N0).  #3 PET scan performed on 08/29/2012 revealed the primary breast cancer measuring 3.2 x 3.3 cm with SUV of 16. It was adjacent nodule along the superiomedial border of the primary mass measuring 1.4 cm which was also hypermetabolic. There were no additional areas of abnormal hypermetabolism.  No abnormal hypermetabolic activity was seen in the chest, abdomen/pelvis,within the liver, pancreas, adrenal glands or spleen. No hypermetabolic lymph nodes. In the skeleton, no focal hypermetabolic activity to suggest skeletal metastasis was seen.  #4 Patient also seen by Dr. Matthew Wakefield and Dr. Stacy Wentworth in consultation. Dr. Wakefield  place the Port-A-Cath on 09/12/2012.  Patient attended a chemotherapy teaching class.   #5 Completed neoadjuvant chemotherapy consisting of Q14 day Adriamycin/Cytoxan x 4 cycles on 10/25/2012.  #6 Received one dose of neoadjuvant chemotherapy with Taxol on 11/08/2012.  She developed significant grade 2 neuropathy and Taxol was discontinued.  #7 s/p  neoadjuvant chemotherapy consisting of single agent Abraxane given on day 1, 8, 15 of each 28 day cycle.  She completed therapy on 11/15/12 - 02/14/13.    #8  status post right breast lumpectomy with sentinel lymph node biopsy on 03/10/13. The final pathology revealed:  Diagnosis 1. Breast, lumpectomy, Right - INVASIVE GRADE II DUCTAL CARCINOMA WITH MICROCALCIFICATIONS AND FOCAL ASSOCIATED MUCIN, SPANNING 2.1 CM IN GREATEST DIMENSION. - LYMPH/VASCULAR INVASION IS IDENTIFIED. - MARGINS ARE   NEGATIVE. - SEE ONCOLOGY TEMPLATE. 1 of 4 FINAL for Elizabeth Miles ([id number]) Diagnosis(continued) 2. Breast, excision, Right retro-areolar tissue, anterior margin - BENIGN BREAST PARENCHYMA WITH FIBROCYSTIC CHANGES. - NO ATYPIA, HYPERPLASIA, OR MALIGNANCY IDENTIFIED. 3. Breast, excision, Right additional  lateral margin - BENIGN BREAST PARENCHYMA WITH FIBROCYSTIC CHANGES. - NO ATYPIA, HYPERPLASIA, OR MALIGNANCY IDENTIFIED. 4. Lymph node, sentinel, biopsy, Right axillary #1 - ONE BENIGN LYMPH NODE WITH NO TUMOR SEEN (0/1).  #9. Repeat Her2Neu testing on final pathology positive: patient recommended anti-her2 therapy with single trastuzemab beginning March 2015   CURRENT THERAPY: RT/Herceptin cycle 2  INTERVAL HISTORY: [name] is a  48 y.o. female who returns for follow up.  abnormal. She is here for cycle 2 of herceptin. She is doing well.  Today she denies any headaches double vision blurring of vision fevers chills night sweats. No shortness of breath chest pains palpitations. No abdominal pain no diarrhea or constipation. She has no easy bruising or bleeding. She has no myalgias and arthralgias. No peripheral paresthesias or gait disturbances. She is working as a Engineer, civil (consulting).Last pelvic exam was 07/2012. Has recent  eye  exam,stable,wears eye glasses.   Remainder of the 10 point review of systems is negative.     MEDICAL HISTORY: Past Medical History  Diagnosis Date  . 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 GERD (gastroesophageal reflux disease)   . GERD (gastroesophageal reflux disease) 08/22/2012  . Allergy   . Complication of anesthesia     1986 ; problem waking up  . Anxiety   . Wears glasses     ALLERGIES:   Allergies  Allergen Reactions  . Darvocet [Propoxyphene N-Acetaminophen] Shortness Of Breath and Swelling  . Shellfish Allergy Shortness Of Breath and Swelling  . Other     CT dye, "chest hurt" "makes me feel cold"     MEDICATIONS:  Current Outpatient Prescriptions  Medication Sig Dispense Refill  . acetaminophen (TYLENOL) 500 MG tablet Take 1,000 mg by mouth every 6 (six) hours as needed.      Marland Kitchen Dexlansoprazole (DEXILANT) 30 MG capsule Take 1 capsule (30 mg total) by mouth daily.  90 capsule  4  . hyaluronate sodium (RADIAPLEXRX) GEL Apply  1 application topically once. Apply after rad txs and bedtime,prn      . ibuprofen (ADVIL,MOTRIN) 800 MG tablet Take 800 mg by mouth every 8 (eight) hours as needed for moderate pain (for back pain).      Marland Kitchen oxyCODONE-acetaminophen (PERCOCET) 5-325 MG per tablet Take 1-2 tablets by mouth every 4 (four) hours as needed.  30 tablet  0  . tamoxifen (NOLVADEX) 20 MG tablet Take 1 tablet (20 mg total) by mouth daily.  90 tablet  3   No current facility-administered medications for this visit.    SURGICAL HISTORY:  Past Surgical History  Procedure Laterality Date  . Dilation and curettage of uterus  1993  . Cervical ablation  1983  . Breast biopsy Right 08/06/12  . Popliteal synovial cyst excision  1970  . Portacath placement Left 09/12/2012    Procedure: INSERTION PORT-A-CATH;  Surgeon: Rolm Bookbinder, MD;  Location: [facility];  Service: General;  Laterality: Left;  . Breast lumpectomy with needle localization and axillary sentinel lymph node bx Right 03/10/2013    Procedure: BREAST LUMPECTOMY WITH NEEDLE LOCALIZATION AND AXILLARY SENTINEL LYMPH NODE BIOPSY;  Surgeon: Rolm Bookbinder, MD;  Location: [facility];  Service: General;  Laterality: Right;  .  Port-a-cath removal Left 03/10/2013    Procedure: REMOVAL PORT-A-CATH;  Surgeon: Rolm Bookbinder, MD;  Location: [facility];  Service: General;  Laterality: Left;    REVIEW OF SYSTEMS:  A 10 point review of systems was completed and is negative except as noted above.  PHYSICAL EXAMINATION: Blood pressure 127/84, pulse 86, temperature 97.9 F (36.6 C), temperature source Oral, resp. rate 18, height 5' 7" (1.702 m), weight 211 lb 12.8 oz (96.072 kg), last menstrual period 03/12/2012. Body mass index is 33.16 kg/(m^2). General: Patient is a well appearing female in no acute distress HEENT: PERRLA, sclerae anicteric no conjunctival pallor, MMM Neck: supple, no palpable adenopathy,neck fullness without palpable  nodules. Lungs: clear to auscultation bilaterally, no wheezes, rhonchi, or rales Cardiovascular: regular rate rhythm, S1, S2, no murmurs, rubs or gallops Abdomen: Soft, non-tender, non-distended, normoactive bowel sounds, no HSM Extremities: warm and well perfused, no clubbing, cyanosis, or edema Skin: No rashes or lesions, mole on right side of neck with minimal erythema Neuro: Non-focal. Breast exam right breast is edematous, erythematous and tender no open areas ECOG PERFORMANCE STATUS: 1 - Symptomatic but completely ambulatory  LABORATORY DATA: Lab Results  Component Value Date   WBC 4.0 05/23/2013   HGB 13.7 05/23/2013   HCT 40.9 05/23/2013   MCV 88.3 05/23/2013   PLT 251 05/23/2013      Chemistry      Component Value Date/Time   NA 142 05/02/2013 1339   K 3.9 05/02/2013 1339   CO2 25 05/02/2013 1339   BUN 8.7 05/02/2013 1339   CREATININE 0.8 05/02/2013 1339      Component Value Date/Time   CALCIUM 10.2 05/02/2013 1339   ALKPHOS 82 05/02/2013 1339   AST 24 05/02/2013 1339   ALT 36 05/02/2013 1339   BILITOT 0.42 05/02/2013 1339    Pathology 03/10/13: Diagnosis 1. Breast, lumpectomy, Right - INVASIVE GRADE II DUCTAL CARCINOMA WITH MICROCALCIFICATIONS AND FOCAL ASSOCIATED MUCIN, SPANNING 2.1 CM IN GREATEST DIMENSION. - LYMPH/VASCULAR INVASION IS IDENTIFIED. - MARGINS ARE NEGATIVE. - SEE ONCOLOGY TEMPLATE. 1 of 4 FINAL for KEALOHILANI, MAIORINO ([id number]) Diagnosis(continued) 2. Breast, excision, Right retro-areolar tissue, anterior margin - BENIGN BREAST PARENCHYMA WITH FIBROCYSTIC CHANGES. - NO ATYPIA, HYPERPLASIA, OR MALIGNANCY IDENTIFIED. 3. Breast, excision, Right additional lateral margin - BENIGN BREAST PARENCHYMA WITH FIBROCYSTIC CHANGES. - NO ATYPIA, HYPERPLASIA, OR MALIGNANCY IDENTIFIED. 4. Lymph node, sentinel, biopsy, Right axillary #1 - ONE BENIGN LYMPH NODE WITH NO TUMOR SEEN (0/1). Microscopic Comment  RADIOGRAPHIC STUDIES: No results found.   ASSESSMENT/PLAN: TALAR FRALEY is a 48 y.o. female diagnosed by biopsy with stage II, invasive ductal carcinoma of the right breast in 07/2012:  #1 S/P neoadjuvant chemotherapy consisting of Q14 day Adriamycin/Cytoxan x 4 cycles on 10/25/2012. She then received one dose of neoadjuvant Taxol on 11/08/2012 unfortunately could not tolerate due to grade 2 neuropathy. The Taxol was discontinued and she was started on single agent Abraxane day 1 day 8 day 15 on a 28 day cycle for a total of 4 cycles. She received this from 11/15/2012 through 02/14/2013.  #2  status post right lumpectomy with sentinel lymph node biopsy performed on 03/10/2013. Final pathology revealed a 2.1 cm grade 2 invasive ductal carcinoma with microcalcifications and associated mucin. Lymphovascular invasion was identified. All margins were negative. One sentinel node was negative for metastatic disease.  #3 right hip pain: resolved  #4.  Thoracic/Lumbar spine/chest  x ray no metastatic disease  #5. Her2Neu positive breast cancer on  final path: ycle 1of adjuvant Herceptin 05/02/2013 #6 cycle 2 of Herceptin today, she continues on  radiation therapy one week remaining.  #7  Cycle #3 in  3 weeks follow up with CBC,CMP,Vit D ,TSH  Port has been removed   Patient to start on Tamoxifen after the completion of Ratiation.Given prescription for Tamoxifen 20 mg one daily. Discussed side effects to include risk of endometrial cancer,eye toxicity,weight gain,hot flashes,risk for blood clots.She is up to date with pelvic exam and eye exam.No family history of blood clots.   All questions answered.  Ms. Kareem and her family were encouraged to contact us in the interim with any questions, concerns, or problems.  I spent 25 minutes counseling the patient face to face.  The total time spent in the appointment was 30 minutes.   Dr.Anna Faidas Oncology/Hematology  [phone] (Office) 05/23/2013    

## 2013-05-24 LAB — VITAMIN D 25 HYDROXY (VIT D DEFICIENCY, FRACTURES): Vit D, 25-Hydroxy: 40 ng/mL (ref 30–89)

## 2013-05-26 ENCOUNTER — Other Ambulatory Visit: Payer: Self-pay

## 2013-05-26 LAB — TSH CHCC: TSH: 1.409 m(IU)/L (ref 0.308–3.960)

## 2013-05-26 NOTE — Progress Notes (Signed)
Rcvd lab results dtd 3/28 and 3/30.  Provided to [facility]

## 2013-06-09 ENCOUNTER — Telehealth: Payer: Self-pay

## 2013-06-09 NOTE — Telephone Encounter (Signed)
Pt LMOVM asking what she can take with the tamoxifen.  Let pt know she should avoid anything with hormones.  She asked if she could take zyrtec, tylenol, ibuprofen with it.  Let her know these should be fine and to call if she needed [place].  Pt voiced understanding.

## 2013-06-12 NOTE — Progress Notes (Signed)
Rcvd treatment report from [employer] dtd 06/06/13.  Copy sent to scan.  Provided to [place].

## 2013-06-13 ENCOUNTER — Other Ambulatory Visit (HOSPITAL_BASED_OUTPATIENT_CLINIC_OR_DEPARTMENT_OTHER): Payer: BC Managed Care – PPO

## 2013-06-13 ENCOUNTER — Telehealth: Payer: Self-pay | Admitting: Oncology

## 2013-06-13 ENCOUNTER — Ambulatory Visit (HOSPITAL_BASED_OUTPATIENT_CLINIC_OR_DEPARTMENT_OTHER): Payer: BC Managed Care – PPO

## 2013-06-13 ENCOUNTER — Encounter: Payer: Self-pay | Admitting: Oncology

## 2013-06-13 ENCOUNTER — Ambulatory Visit (HOSPITAL_BASED_OUTPATIENT_CLINIC_OR_DEPARTMENT_OTHER): Payer: BC Managed Care – PPO | Admitting: Oncology

## 2013-06-13 VITALS — BP 120/84 | HR 84 | Temp 97.7°F | Resp 18 | Ht 67.0 in | Wt 212.2 lb

## 2013-06-13 DIAGNOSIS — Z170 Estrogen receptor positive status [ER+]: Secondary | ICD-10-CM

## 2013-06-13 DIAGNOSIS — C50919 Malignant neoplasm of unspecified site of unspecified female breast: Secondary | ICD-10-CM

## 2013-06-13 DIAGNOSIS — C50419 Malignant neoplasm of upper-outer quadrant of unspecified female breast: Secondary | ICD-10-CM

## 2013-06-13 DIAGNOSIS — M549 Dorsalgia, unspecified: Secondary | ICD-10-CM

## 2013-06-13 DIAGNOSIS — Z5112 Encounter for antineoplastic immunotherapy: Secondary | ICD-10-CM

## 2013-06-13 DIAGNOSIS — R079 Chest pain, unspecified: Secondary | ICD-10-CM

## 2013-06-13 LAB — COMPREHENSIVE METABOLIC PANEL (CC13)
ALT: 34 U/L (ref 0–55)
AST: 24 U/L (ref 5–34)
Albumin: 4.5 g/dL (ref 3.5–5.0)
Alkaline Phosphatase: 76 U/L (ref 40–150)
Anion Gap: 11 mEq/L (ref 3–11)
BUN: 10.7 mg/dL (ref 7.0–26.0)
CALCIUM: 10.4 mg/dL (ref 8.4–10.4)
CHLORIDE: 104 meq/L (ref 98–109)
CO2: 27 mEq/L (ref 22–29)
Creatinine: 0.8 mg/dL (ref 0.6–1.1)
Glucose: 94 mg/dl (ref 70–140)
Potassium: 4.1 mEq/L (ref 3.5–5.1)
Sodium: 142 mEq/L (ref 136–145)
Total Bilirubin: 0.61 mg/dL (ref 0.20–1.20)
Total Protein: 7.9 g/dL (ref 6.4–8.3)

## 2013-06-13 LAB — CBC WITH DIFFERENTIAL/PLATELET
BASO%: 0.9 % (ref 0.0–2.0)
Basophils Absolute: 0 10*3/uL (ref 0.0–0.1)
EOS%: 2 % (ref 0.0–7.0)
Eosinophils Absolute: 0.1 10*3/uL (ref 0.0–0.5)
HEMATOCRIT: 40.6 % (ref 34.8–46.6)
HGB: 13.7 g/dL (ref 11.6–15.9)
LYMPH%: 25.2 % (ref 14.0–49.7)
MCH: 29.7 pg (ref 25.1–34.0)
MCHC: 33.7 g/dL (ref 31.5–36.0)
MCV: 87.9 fL (ref 79.5–101.0)
MONO#: 0.4 10*3/uL (ref 0.1–0.9)
MONO%: 11.2 % (ref 0.0–14.0)
NEUT#: 2.1 10*3/uL (ref 1.5–6.5)
NEUT%: 60.7 % (ref 38.4–76.8)
PLATELETS: 243 10*3/uL (ref 145–400)
RBC: 4.62 10*6/uL (ref 3.70–5.45)
RDW: 12.9 % (ref 11.2–14.5)
WBC: 3.5 10*3/uL — ABNORMAL LOW (ref 3.9–10.3)
lymph#: 0.9 10*3/uL (ref 0.9–3.3)
nRBC: 0 % (ref 0–0)

## 2013-06-13 MED ORDER — DIPHENHYDRAMINE HCL 25 MG PO CAPS
50.0000 mg | ORAL_CAPSULE | Freq: Once | ORAL | Status: AC
  Administered 2013-06-13: 50 mg via ORAL

## 2013-06-13 MED ORDER — TRASTUZUMAB CHEMO INJECTION 440 MG
6.0000 mg/kg | Freq: Once | INTRAVENOUS | Status: AC
  Administered 2013-06-13: 567 mg via INTRAVENOUS
  Filled 2013-06-13: qty 27

## 2013-06-13 MED ORDER — SODIUM CHLORIDE 0.9 % IV SOLN
Freq: Once | INTRAVENOUS | Status: AC
  Administered 2013-06-13: 16:00:00 via INTRAVENOUS

## 2013-06-13 MED ORDER — ACETAMINOPHEN 325 MG PO TABS
650.0000 mg | ORAL_TABLET | Freq: Once | ORAL | Status: AC
  Administered 2013-06-13: 650 mg via ORAL

## 2013-06-13 MED ORDER — DIPHENHYDRAMINE HCL 25 MG PO CAPS
ORAL_CAPSULE | ORAL | Status: AC
  Filled 2013-06-13: qty 2

## 2013-06-13 MED ORDER — ACETAMINOPHEN 325 MG PO TABS
ORAL_TABLET | ORAL | Status: AC
  Filled 2013-06-13: qty 2

## 2013-06-13 NOTE — Patient Instructions (Signed)
[facility] Discharge Instructions for Patients Receiving Chemotherapy  Today you received the following chemotherapy agents herceptin   To help prevent nausea and vomiting after your treatment, we encourage you to take your nausea medication as directed   If you develop nausea and vomiting that is not controlled by your nausea medication, call the clinic.   BELOW ARE SYMPTOMS THAT SHOULD BE REPORTED IMMEDIATELY:  *FEVER GREATER THAN 100.5 F  *CHILLS WITH OR WITHOUT FEVER  NAUSEA AND VOMITING THAT IS NOT CONTROLLED WITH YOUR NAUSEA MEDICATION  *UNUSUAL SHORTNESS OF BREATH  *UNUSUAL BRUISING OR BLEEDING  TENDERNESS IN MOUTH AND THROAT WITH OR WITHOUT PRESENCE OF ULCERS  *URINARY PROBLEMS  *BOWEL PROBLEMS  UNUSUAL RASH Items with * indicate a potential emergency and should be followed up as soon as possible.  Feel free to call the clinic you have any questions or concerns. The clinic phone number is [phone].  

## 2013-06-13 NOTE — Progress Notes (Signed)
Spoke with patient about asst with herceptin and she declined. Insurance is paying 100%. She will let me know if changes.

## 2013-06-13 NOTE — Telephone Encounter (Signed)
per pof sch chemo-sent email to MW to sch

## 2013-06-16 ENCOUNTER — Telehealth: Payer: Self-pay | Admitting: *Deleted

## 2013-06-16 NOTE — Telephone Encounter (Signed)
Per staff message and POF I have scheduled appts.  JMW  

## 2013-06-27 ENCOUNTER — Telehealth: Payer: Self-pay | Admitting: Oncology

## 2013-06-27 NOTE — Telephone Encounter (Signed)
Lvm advising appt time change and pt to see Mendel Ryder due to [place].Gave appt time on 5/8 1.45 lab then ml and infusion to follow.

## 2013-07-04 ENCOUNTER — Encounter: Payer: Self-pay | Admitting: Adult Health

## 2013-07-04 ENCOUNTER — Ambulatory Visit (HOSPITAL_BASED_OUTPATIENT_CLINIC_OR_DEPARTMENT_OTHER): Payer: BC Managed Care – PPO

## 2013-07-04 ENCOUNTER — Telehealth: Payer: Self-pay | Admitting: Adult Health

## 2013-07-04 ENCOUNTER — Other Ambulatory Visit (HOSPITAL_BASED_OUTPATIENT_CLINIC_OR_DEPARTMENT_OTHER): Payer: BC Managed Care – PPO

## 2013-07-04 ENCOUNTER — Ambulatory Visit (HOSPITAL_BASED_OUTPATIENT_CLINIC_OR_DEPARTMENT_OTHER): Payer: BC Managed Care – PPO | Admitting: Adult Health

## 2013-07-04 VITALS — BP 138/88 | HR 98 | Temp 98.5°F | Resp 18 | Ht 67.0 in | Wt 216.2 lb

## 2013-07-04 DIAGNOSIS — C50919 Malignant neoplasm of unspecified site of unspecified female breast: Secondary | ICD-10-CM

## 2013-07-04 DIAGNOSIS — F0630 Mood disorder due to known physiological condition, unspecified: Secondary | ICD-10-CM

## 2013-07-04 DIAGNOSIS — C50419 Malignant neoplasm of upper-outer quadrant of unspecified female breast: Secondary | ICD-10-CM

## 2013-07-04 DIAGNOSIS — Z170 Estrogen receptor positive status [ER+]: Secondary | ICD-10-CM

## 2013-07-04 DIAGNOSIS — Z5111 Encounter for antineoplastic chemotherapy: Secondary | ICD-10-CM

## 2013-07-04 DIAGNOSIS — Z79811 Long term (current) use of aromatase inhibitors: Secondary | ICD-10-CM

## 2013-07-04 LAB — COMPREHENSIVE METABOLIC PANEL (CC13)
ALT: 25 U/L (ref 0–55)
AST: 22 U/L (ref 5–34)
Albumin: 3.9 g/dL (ref 3.5–5.0)
Alkaline Phosphatase: 75 U/L (ref 40–150)
Anion Gap: 11 mEq/L (ref 3–11)
BILIRUBIN TOTAL: 0.42 mg/dL (ref 0.20–1.20)
BUN: 12 mg/dL (ref 7.0–26.0)
CO2: 25 mEq/L (ref 22–29)
Calcium: 9.6 mg/dL (ref 8.4–10.4)
Chloride: 107 mEq/L (ref 98–109)
Creatinine: 0.9 mg/dL (ref 0.6–1.1)
GLUCOSE: 103 mg/dL (ref 70–140)
Potassium: 3.7 mEq/L (ref 3.5–5.1)
SODIUM: 143 meq/L (ref 136–145)
Total Protein: 7.4 g/dL (ref 6.4–8.3)

## 2013-07-04 LAB — CBC WITH DIFFERENTIAL/PLATELET
BASO%: 0.7 % (ref 0.0–2.0)
BASOS ABS: 0 10*3/uL (ref 0.0–0.1)
EOS%: 4.6 % (ref 0.0–7.0)
Eosinophils Absolute: 0.2 10*3/uL (ref 0.0–0.5)
HCT: 38.2 % (ref 34.8–46.6)
HEMOGLOBIN: 12.9 g/dL (ref 11.6–15.9)
LYMPH%: 22.7 % (ref 14.0–49.7)
MCH: 29.9 pg (ref 25.1–34.0)
MCHC: 33.8 g/dL (ref 31.5–36.0)
MCV: 88.4 fL (ref 79.5–101.0)
MONO#: 0.5 10*3/uL (ref 0.1–0.9)
MONO%: 10.9 % (ref 0.0–14.0)
NEUT#: 2.5 10*3/uL (ref 1.5–6.5)
NEUT%: 61.1 % (ref 38.4–76.8)
Platelets: 233 10*3/uL (ref 145–400)
RBC: 4.32 10*6/uL (ref 3.70–5.45)
RDW: 13.1 % (ref 11.2–14.5)
WBC: 4.1 10*3/uL (ref 3.9–10.3)
lymph#: 0.9 10*3/uL (ref 0.9–3.3)
nRBC: 0 % (ref 0–0)

## 2013-07-04 MED ORDER — DIPHENHYDRAMINE HCL 25 MG PO CAPS
ORAL_CAPSULE | ORAL | Status: AC
  Filled 2013-07-04: qty 2

## 2013-07-04 MED ORDER — TRASTUZUMAB CHEMO INJECTION 440 MG
6.0000 mg/kg | Freq: Once | INTRAVENOUS | Status: AC
  Administered 2013-07-04: 567 mg via INTRAVENOUS
  Filled 2013-07-04: qty 27

## 2013-07-04 MED ORDER — ACETAMINOPHEN 325 MG PO TABS
650.0000 mg | ORAL_TABLET | Freq: Once | ORAL | Status: AC
  Administered 2013-07-04: 650 mg via ORAL

## 2013-07-04 MED ORDER — SODIUM CHLORIDE 0.9 % IV SOLN
Freq: Once | INTRAVENOUS | Status: AC
  Administered 2013-07-04: 16:00:00 via INTRAVENOUS

## 2013-07-04 MED ORDER — DIPHENHYDRAMINE HCL 25 MG PO CAPS
50.0000 mg | ORAL_CAPSULE | Freq: Once | ORAL | Status: AC
  Administered 2013-07-04: 50 mg via ORAL

## 2013-07-04 MED ORDER — ACETAMINOPHEN 325 MG PO TABS
ORAL_TABLET | ORAL | Status: AC
  Filled 2013-07-04: qty 2

## 2013-07-04 NOTE — Patient Instructions (Signed)
Stop taking the tamoxifen x 3 weeks and lets see if it helps how you feel.

## 2013-07-04 NOTE — Progress Notes (Signed)
[facility]  Telephone:(336) [phone] Fax:(336) [phone]     ID: Elpidio Eric OB: 1965/11/29  MR#: [id number]  GEZ#:[id number]  PCP: San Morelle, MD GYN:   SU: Dr. Rolm Bookbinder OTHER MD:  Radiation oncology-Dr. 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    BREAST CANCER HISTORY:  #1 Normal mammogram in 07/2011. On physical exam however the possibility of a cyst was noted in the right breast. In 07/2012 patient noted on exam another lump in the right breast. She underwent a diagnostic mammogram on 08/06/2012 that showed a right breast nodule in the outer quadrant. She had an ultrasound performed that showed at the 9:30 o'clock position a 3.6 cm area, and a 10:00 position a 1.3 cm area with a total area measuring between 5-6 cm. Right breast needle core biopsy on 08/06/2012 performed in [place], [place] showed pathology that revealed an intermediate grade invasive ductal carcinoma. (This is been confirmed by our pathology as well). The tumor was estrogen receptor positive strongly (100%), progesterone receptor negative, HER-2/neu negative, with a Ki-67 that showed a high proliferation rate.   CURRENT THERAPY: Herceptin every three weeks, Tamoxifen $RemoveBeforeDE'20mg'[id number]$  daily  INTERVAL HISTORY:  Patient is here today for evaluation prior to receiving Herceptin therapy.  She is receiving the therapy adjuvantly on day 1 of a 21 day cycle.  She is doing well.  She is taking Tamoxifen daily.  She says that once she started therapy with Tamoxifen she has been increasingly sad and tearful.  She states that she cries for no reason.  She doesn't like the way that it makes her feel.  Otherwise, she is tolerating the herceptin well. She was last evaluated by Dr. Haroldine Laws on 04/23/13.  She denies any chest pain, palpitations, swelling, or any further  concerns.    REVIEW OF SYSTEMS: A 10 point review of systems was conducted and is otherwise negative except for what is noted above.     PAST MEDICAL HISTORY: Past Medical History  Diagnosis Date  . 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 GERD (gastroesophageal reflux disease)   . GERD (gastroesophageal reflux disease) 08/22/2012  . Allergy   . Complication of anesthesia     1986 ; problem waking up  . Anxiety   . Wears glasses     PAST SURGICAL HISTORY: Past Surgical History  Procedure Laterality Date  . Dilation and curettage of uterus  1993  . Cervical ablation  1983  . Breast biopsy Right 08/06/12  . Popliteal synovial cyst excision  1970  . Portacath placement Left 09/12/2012    Procedure: INSERTION PORT-A-CATH;  Surgeon: Rolm Bookbinder, MD;  Location: [facility];  Service: General;  Laterality: Left;  . Breast lumpectomy with needle localization and axillary sentinel lymph node bx Right 03/10/2013    Procedure: BREAST LUMPECTOMY WITH NEEDLE LOCALIZATION AND AXILLARY SENTINEL LYMPH NODE BIOPSY;  Surgeon: Rolm Bookbinder, MD;  Location: [facility];  Service: General;  Laterality: Right;  . Port-a-cath removal Left 03/10/2013    Procedure: REMOVAL PORT-A-CATH;  Surgeon: Rolm Bookbinder, MD;  Location: [facility];  Service: General;  Laterality: Left;    FAMILY HISTORY Family History  Problem Relation Age of Onset  . Hypertension Mother   . Bladder Cancer Maternal Uncle 68  . Cancer Paternal Grandmother 67  lung cancer  . Lung cancer Paternal Grandfather     dx in his 61s  . COPD Paternal Uncle     GYNECOLOGIC HISTORY:   SOCIAL HISTORY:      ADVANCED DIRECTIVES:    HEALTH MAINTENANCE: History  Substance Use Topics  . Smoking status: Never Smoker   . Smokeless tobacco: Never Used  . Alcohol Use: No     Colonoscopy:  PAP:  Bone density:  Lipid panel:  Allergies  Allergen Reactions  . Darvocet [Propoxyphene  N-Acetaminophen] Shortness Of Breath and Swelling  . Shellfish Allergy Shortness Of Breath and Swelling  . Other     CT dye, "chest hurt" "makes me feel cold"    Current Outpatient Prescriptions  Medication Sig Dispense Refill  . acetaminophen (TYLENOL) 500 MG tablet Take 1,000 mg by mouth every 6 (six) hours as needed.      Marland Kitchen Dexlansoprazole (DEXILANT) 30 MG capsule Take 1 capsule (30 mg total) by mouth daily.  90 capsule  4  . tamoxifen (NOLVADEX) 20 MG tablet Take 1 tablet (20 mg total) by mouth daily.  90 tablet  3  . ibuprofen (ADVIL,MOTRIN) 800 MG tablet Take 800 mg by mouth every 8 (eight) hours as needed for moderate pain (for back pain).      Marland Kitchen oxyCODONE-acetaminophen (PERCOCET) 5-325 MG per tablet Take 1-2 tablets by mouth every 4 (four) hours as needed.  30 tablet  0   No current facility-administered medications for this visit.    OBJECTIVE: Filed Vitals:   07/04/13 1405  BP: 138/88  Pulse: 98  Temp: 98.5 F (36.9 C)  Resp: 18     Body mass index is 33.85 kg/(m^2).     GENERAL: Patient is a well appearing female in no acute distress HEENT:  Sclerae anicteric.  Oropharynx clear and moist. No ulcerations or evidence of oropharyngeal candidiasis. Neck is supple.  NODES:  No cervical, supraclavicular, or axillary lymphadenopathy palpated.  BREAST EXAM:  Deferred. LUNGS:  Clear to auscultation bilaterally.  No wheezes or rhonchi. HEART:  Regular rate and rhythm. No murmur appreciated. ABDOMEN:  Soft, nontender.  Positive, normoactive bowel sounds. No organomegaly palpated. MSK:  No focal spinal tenderness to palpation. Full range of motion bilaterally in the upper extremities. EXTREMITIES:  No peripheral edema.   SKIN:  Clear with no obvious rashes or skin changes. No nail dyscrasia. NEURO:  Nonfocal. Well oriented.  Appropriate affect. ECOG FS:1 - Symptomatic but completely ambulatory  LAB RESULTS:  CMP     Component Value Date/Time   NA 143 07/04/2013 1342   K 3.7  07/04/2013 1342   CO2 25 07/04/2013 1342   GLUCOSE 103 07/04/2013 1342   BUN 12.0 07/04/2013 1342   CREATININE 0.9 07/04/2013 1342   CALCIUM 9.6 07/04/2013 1342   PROT 7.4 07/04/2013 1342   ALBUMIN 3.9 07/04/2013 1342   AST 22 07/04/2013 1342   ALT 25 07/04/2013 1342   ALKPHOS 75 07/04/2013 1342   BILITOT 0.42 07/04/2013 1342    I No results found for this basename: SPEP,  UPEP,   kappa and lambda light chains    Lab Results  Component Value Date   WBC 4.1 07/04/2013   NEUTROABS 2.5 07/04/2013   HGB 12.9 07/04/2013   HCT 38.2 07/04/2013   MCV 88.4 07/04/2013   PLT 233 07/04/2013      Chemistry      Component Value Date/Time   NA 143 07/04/2013 1342   K 3.7 07/04/2013 1342  CO2 25 07/04/2013 1342   BUN 12.0 07/04/2013 1342   CREATININE 0.9 07/04/2013 1342      Component Value Date/Time   CALCIUM 9.6 07/04/2013 1342   ALKPHOS 75 07/04/2013 1342   AST 22 07/04/2013 1342   ALT 25 07/04/2013 1342   BILITOT 0.42 07/04/2013 1342       No results found for this basename: LABCA2    No components found with this basename: LABCA125    No results found for this basename: INR,  in the last 168 hours  Urinalysis    Component Value Date/Time   LABSPEC 1.010 12/13/2012 1435   GLUCOSEU Negative 12/13/2012 1435   UROBILINOGEN 0.2 12/13/2012 1435    STUDIES: No results found.  ASSESSMENT: 48 y.o. 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     #1 Normal mammogram in 07/2011. On physical exam however the possibility of a cyst was noted in the right breast. In 07/2012 patient noted on exam another lump in the right breast. She underwent a diagnostic mammogram on 08/06/2012 that showed a right breast nodule in the outer quadrant. She had an ultrasound performed that showed at the 9:30 o'clock position a 3.6 cm area, and a 10:00 position a 1.3 cm area with a total area measuring between 5-6 cm. Right breast needle core biopsy on  08/06/2012 performed in [place], [place] showed pathology that revealed an intermediate grade invasive ductal carcinoma. (This is been confirmed by our pathology as well). The tumor was estrogen receptor positive strongly (100%), progesterone receptor negative, HER-2/neu negative, with a Ki-67 that showed a high proliferation rate.   #2 Bilateral breast MRI on 08/27/2012 revealed in the right upper quadrant irregular lobulated mass with a satellite nodule or lobulation within 2 mm at its superior aspect, measured together as 4.5 x 4.0 x 3.8 cm. There was extension of enhancement to the nipple, suggesting nipple involvement may be present. No lymphadenopathy was noted there was no any other area of abnormal enhancement in either breast (clinical stage IIA, T2 N0).   #3 PET scan performed on 08/29/2012 revealed the primary breast cancer measuring 3.2 x 3.3 cm with SUV of 16. It was adjacent nodule along the superiomedial border of the primary mass measuring 1.4 cm which was also hypermetabolic. There were no additional areas of abnormal hypermetabolism. No abnormal hypermetabolic activity was seen in the chest, abdomen/pelvis,within the liver, pancreas, adrenal glands or spleen. No hypermetabolic lymph nodes. In the skeleton, no focal hypermetabolic activity to suggest skeletal metastasis was seen.   #4 Patient also seen by Dr. Rolm Bookbinder and Dr. Thea Silversmith in consultation. Dr. Donne Hazel placed a  Port-A-Cath on 09/12/2012. Patient attended a chemotherapy teaching class.   #5 Completed neoadjuvant chemotherapy consisting of Q14 day Adriamycin/Cytoxan x 4 cycles on 10/25/2012.   #6 Received one dose of neoadjuvant chemotherapy with Taxol on 11/08/2012. She developed significant grade 2 neuropathy and Taxol was discontinued.   #7 s/p neoadjuvant chemotherapy consisting of single agent Abraxane given on day 1, 8, 15 of each 28 day cycle. She completed therapy on 11/15/12 - 02/14/13.   #8 status  post right breast lumpectomy with sentinel lymph node biopsy on 03/10/13. The final pathology revealed:  Diagnosis  1. Breast, lumpectomy, Right  - INVASIVE GRADE II DUCTAL CARCINOMA WITH MICROCALCIFICATIONS AND FOCAL  ASSOCIATED MUCIN, SPANNING 2.1 CM IN GREATEST DIMENSION.  - LYMPH/VASCULAR INVASION IS IDENTIFIED.  -  MARGINS ARE NEGATIVE.  - SEE ONCOLOGY TEMPLATE.  1 of 4  FINAL for ANAHIT, KLUMB ([id number])  Diagnosis(continued)  2. Breast, excision, Right retro-areolar tissue, anterior margin  - BENIGN BREAST PARENCHYMA WITH FIBROCYSTIC CHANGES.  - NO ATYPIA, HYPERPLASIA, OR MALIGNANCY IDENTIFIED.  3. Breast, excision, Right additional lateral margin  - BENIGN BREAST PARENCHYMA WITH FIBROCYSTIC CHANGES.  - NO ATYPIA, HYPERPLASIA, OR MALIGNANCY IDENTIFIED.  4. Lymph node, sentinel, biopsy, Right axillary #1  - ONE BENIGN LYMPH NODE WITH NO TUMOR SEEN (0/1).   #9. Repeat Her2Neu testing on final pathology positive: patient recommended anti-her2 therapy with single trastuzumab that began in March 2015  PLAN: Ms. Mcquinn is here today for evaluation prior to her adjuvant Herceptin.  Her labs are stable and she is tolerating therapy relatively well. She will proceed with treatment today. As far as her increased tearfulness is concerned, I recommended that she stop taking Tamoxifen for the next three weeks and we will re-evaluate her tearfulness at her next appointment.  We discussed other options of anti-estrogen therapy as well.    She will return in 3 weeks for labs, evaluation, and Herceptin therapy.   She knows to call us in the interim for any questions or concerns.  We can certainly see her sooner if needed.  I spent 25 minutes counseling the patient face to face.  The total time spent in the appointment was 30 minutes.  Minette Headland, [place] [phone]  07/11/2013 3:35 PM

## 2013-07-04 NOTE — Telephone Encounter (Signed)
, °

## 2013-07-05 ENCOUNTER — Ambulatory Visit: Payer: BC Managed Care – PPO

## 2013-07-09 ENCOUNTER — Telehealth: Payer: Self-pay | Admitting: *Deleted

## 2013-07-09 NOTE — Telephone Encounter (Signed)
I have adjusted 5/29 appt

## 2013-07-24 ENCOUNTER — Ambulatory Visit (HOSPITAL_COMMUNITY)
Admission: RE | Admit: 2013-07-24 | Discharge: 2013-07-24 | Disposition: A | Discharge status: Home / Self Care | Payer: BC Managed Care – PPO | Source: Ambulatory Visit | Attending: General Surgery | Admitting: General Surgery

## 2013-07-24 ENCOUNTER — Ambulatory Visit (HOSPITAL_BASED_OUTPATIENT_CLINIC_OR_DEPARTMENT_OTHER)
Admission: RE | Admit: 2013-07-24 | Discharge: 2013-07-24 | Disposition: A | Discharge status: Home / Self Care | Payer: BC Managed Care – PPO | Source: Ambulatory Visit | Attending: Internal Medicine | Admitting: Internal Medicine

## 2013-07-24 ENCOUNTER — Encounter (HOSPITAL_COMMUNITY): Payer: Self-pay

## 2013-07-24 VITALS — BP 100/52 | HR 80 | Wt 214.8 lb

## 2013-07-24 DIAGNOSIS — K219 Gastro-esophageal reflux disease without esophagitis: Secondary | ICD-10-CM | POA: Insufficient documentation

## 2013-07-24 DIAGNOSIS — C50919 Malignant neoplasm of unspecified site of unspecified female breast: Secondary | ICD-10-CM

## 2013-07-24 NOTE — Patient Instructions (Signed)
We will contact you in 3 months to schedule your next appointment and echocardiogram  

## 2013-07-25 ENCOUNTER — Other Ambulatory Visit (HOSPITAL_BASED_OUTPATIENT_CLINIC_OR_DEPARTMENT_OTHER): Payer: BC Managed Care – PPO

## 2013-07-25 ENCOUNTER — Ambulatory Visit (HOSPITAL_BASED_OUTPATIENT_CLINIC_OR_DEPARTMENT_OTHER): Payer: BC Managed Care – PPO

## 2013-07-25 ENCOUNTER — Telehealth: Payer: Self-pay | Admitting: Adult Health

## 2013-07-25 ENCOUNTER — Ambulatory Visit (HOSPITAL_BASED_OUTPATIENT_CLINIC_OR_DEPARTMENT_OTHER): Payer: BC Managed Care – PPO | Admitting: Adult Health

## 2013-07-25 ENCOUNTER — Encounter: Payer: Self-pay | Admitting: Adult Health

## 2013-07-25 ENCOUNTER — Other Ambulatory Visit: Payer: BC Managed Care – PPO

## 2013-07-25 ENCOUNTER — Ambulatory Visit: Payer: BC Managed Care – PPO | Admitting: Oncology

## 2013-07-25 VITALS — BP 121/84 | HR 80 | Temp 98.3°F | Resp 20 | Ht 67.0 in | Wt 215.4 lb

## 2013-07-25 DIAGNOSIS — C50919 Malignant neoplasm of unspecified site of unspecified female breast: Secondary | ICD-10-CM

## 2013-07-25 DIAGNOSIS — C50419 Malignant neoplasm of upper-outer quadrant of unspecified female breast: Secondary | ICD-10-CM

## 2013-07-25 DIAGNOSIS — F32A Depression, unspecified: Secondary | ICD-10-CM

## 2013-07-25 DIAGNOSIS — F3289 Other specified depressive episodes: Secondary | ICD-10-CM

## 2013-07-25 DIAGNOSIS — F329 Major depressive disorder, single episode, unspecified: Secondary | ICD-10-CM

## 2013-07-25 DIAGNOSIS — Z5112 Encounter for antineoplastic immunotherapy: Secondary | ICD-10-CM

## 2013-07-25 LAB — COMPREHENSIVE METABOLIC PANEL (CC13)
ALT: 25 U/L (ref 0–55)
ANION GAP: 12 meq/L — AB (ref 3–11)
AST: 21 U/L (ref 5–34)
Albumin: 3.9 g/dL (ref 3.5–5.0)
Alkaline Phosphatase: 64 U/L (ref 40–150)
BUN: 10.7 mg/dL (ref 7.0–26.0)
CALCIUM: 9.2 mg/dL (ref 8.4–10.4)
CHLORIDE: 109 meq/L (ref 98–109)
CO2: 21 meq/L — AB (ref 22–29)
CREATININE: 0.7 mg/dL (ref 0.6–1.1)
GLUCOSE: 90 mg/dL (ref 70–140)
Potassium: 3.9 mEq/L (ref 3.5–5.1)
Sodium: 142 mEq/L (ref 136–145)
Total Bilirubin: 0.56 mg/dL (ref 0.20–1.20)
Total Protein: 6.9 g/dL (ref 6.4–8.3)

## 2013-07-25 LAB — CBC WITH DIFFERENTIAL/PLATELET
BASO%: 0.8 % (ref 0.0–2.0)
BASOS ABS: 0 10*3/uL (ref 0.0–0.1)
EOS%: 2.9 % (ref 0.0–7.0)
Eosinophils Absolute: 0.1 10*3/uL (ref 0.0–0.5)
HCT: 37.3 % (ref 34.8–46.6)
HEMOGLOBIN: 12.5 g/dL (ref 11.6–15.9)
LYMPH#: 1 10*3/uL (ref 0.9–3.3)
LYMPH%: 25.9 % (ref 14.0–49.7)
MCH: 29.6 pg (ref 25.1–34.0)
MCHC: 33.5 g/dL (ref 31.5–36.0)
MCV: 88.4 fL (ref 79.5–101.0)
MONO#: 0.3 10*3/uL (ref 0.1–0.9)
MONO%: 8.3 % (ref 0.0–14.0)
NEUT#: 2.3 10*3/uL (ref 1.5–6.5)
NEUT%: 62.1 % (ref 38.4–76.8)
Platelets: 224 10*3/uL (ref 145–400)
RBC: 4.22 10*6/uL (ref 3.70–5.45)
RDW: 13.2 % (ref 11.2–14.5)
WBC: 3.7 10*3/uL — AB (ref 3.9–10.3)

## 2013-07-25 MED ORDER — TRASTUZUMAB CHEMO INJECTION 440 MG
6.0000 mg/kg | Freq: Once | INTRAVENOUS | Status: AC
  Administered 2013-07-25: 567 mg via INTRAVENOUS
  Filled 2013-07-25: qty 27

## 2013-07-25 MED ORDER — SODIUM CHLORIDE 0.9 % IV SOLN
Freq: Once | INTRAVENOUS | Status: AC
  Administered 2013-07-25: 13:00:00 via INTRAVENOUS

## 2013-07-25 MED ORDER — DIPHENHYDRAMINE HCL 25 MG PO CAPS
ORAL_CAPSULE | ORAL | Status: AC
  Filled 2013-07-25: qty 2

## 2013-07-25 MED ORDER — ACETAMINOPHEN 325 MG PO TABS
650.0000 mg | ORAL_TABLET | Freq: Once | ORAL | Status: AC
  Administered 2013-07-25: 650 mg via ORAL

## 2013-07-25 MED ORDER — ACETAMINOPHEN 325 MG PO TABS
ORAL_TABLET | ORAL | Status: AC
  Filled 2013-07-25: qty 2

## 2013-07-25 MED ORDER — DIPHENHYDRAMINE HCL 25 MG PO CAPS
50.0000 mg | ORAL_CAPSULE | Freq: Once | ORAL | Status: AC
  Administered 2013-07-25: 50 mg via ORAL

## 2013-07-25 NOTE — Progress Notes (Signed)
[facility]  Telephone:(336) [phone] Fax:(336) [phone]     ID: Elizabeth Miles OB: 29-Jun-1965  MR#: [id number]  YFV#:[id number]  PCP: San Morelle, MD GYN:   SU: Dr. Rolm Bookbinder OTHER MD:  Radiation oncology-Dr. 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    BREAST CANCER HISTORY:  #1 Normal mammogram in 07/2011. On physical exam however the possibility of a cyst was noted in the right breast. In 07/2012 patient noted on exam another lump in the right breast. She underwent a diagnostic mammogram on 08/06/2012 that showed a right breast nodule in the outer quadrant. She had an ultrasound performed that showed at the 9:30 o'clock position a 3.6 cm area, and a 10:00 position a 1.3 cm area with a total area measuring between 5-6 cm. Right breast needle core biopsy on 08/06/2012 performed in [place], [place] showed pathology that revealed an intermediate grade invasive ductal carcinoma. (This is been confirmed by our pathology as well). The tumor was estrogen receptor positive strongly (100%), progesterone receptor negative, HER-2/neu negative, with a Ki-67 that showed a high proliferation rate.   CURRENT THERAPY: Herceptin every three weeks, Tamoxifen 56m daily  INTERVAL HISTORY:  Patient is here today for evaluation prior to receiving Herceptin therapy.  She is receiving the therapy adjuvantly on day 1 of a 21 day cycle. She has not been taking Tamoxifen due to feeling more depressed on it.  She has been on anti-depressants in the past and did not have good experiences with them.  She has not been taking the Tamoxifen and has not noticed an improvement in the depression.  She continues to cry a lot and is experiencing anhedonia.  She denies fevers, chills, chest pain, swelling, increased DOE, orthopnea, or any further  concerns.  She denies suicidal or homicidal ideation.  REVIEW OF SYSTEMS: A 10 point review of systems was conducted and is otherwise negative except for what is noted above.     PAST MEDICAL HISTORY: Past Medical History  Diagnosis Date  . 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 GERD (gastroesophageal reflux disease)   . GERD (gastroesophageal reflux disease) 08/22/2012  . Allergy   . Complication of anesthesia     1986 ; problem waking up  . Anxiety   . Wears glasses     PAST SURGICAL HISTORY: Past Surgical History  Procedure Laterality Date  . Dilation and curettage of uterus  1993  . Cervical ablation  1983  . Breast biopsy Right 08/06/12  . Popliteal synovial cyst excision  1970  . Portacath placement Left 09/12/2012    Procedure: INSERTION PORT-A-CATH;  Surgeon: MRolm Bookbinder MD;  Location: [facility]  Service: General;  Laterality: Left;  . Breast lumpectomy with needle localization and axillary sentinel lymph node bx Right 03/10/2013    Procedure: BREAST LUMPECTOMY WITH NEEDLE LOCALIZATION AND AXILLARY SENTINEL LYMPH NODE BIOPSY;  Surgeon: MRolm Bookbinder MD;  [facility]  Service: General;  Laterality: Right;  . Port-a-cath removal Left 03/10/2013    Procedure: REMOVAL PORT-A-CATH;  Surgeon: MRolm Bookbinder MD;  Location[facility]  Service: General;  Laterality: Left;    FAMILY HISTORY Family History  Problem Relation Age of Onset  . Hypertension Mother   . Bladder Cancer Maternal Uncle 68  . Cancer Paternal Grandmother  63    lung cancer  . Lung cancer Paternal Grandfather     dx in his 26s  . COPD Paternal Uncle     GYNECOLOGIC HISTORY:   SOCIAL HISTORY:     ADVANCED DIRECTIVES:    HEALTH MAINTENANCE: History  Substance Use Topics  . Smoking status: Never Smoker   . Smokeless tobacco: Never Used  . Alcohol Use: No     Colonoscopy:  PAP:  Bone density:  Lipid panel:  Allergies   Allergen Reactions  . Darvocet [Propoxyphene N-Acetaminophen] Shortness Of Breath and Swelling  . Shellfish Allergy Shortness Of Breath and Swelling  . Other     CT dye, "chest hurt" "makes me feel cold"    Current Outpatient Prescriptions  Medication Sig Dispense Refill  . Dexlansoprazole (DEXILANT) 30 MG capsule Take 1 capsule (30 mg total) by mouth daily.  90 capsule  4  . acetaminophen (TYLENOL) 500 MG tablet Take 1,000 mg by mouth every 6 (six) hours as needed.      Marland Kitchen ibuprofen (ADVIL,MOTRIN) 800 MG tablet Take 800 mg by mouth every 8 (eight) hours as needed for moderate pain (for back pain).      Marland Kitchen oxyCODONE-acetaminophen (PERCOCET) 5-325 MG per tablet Take 1-2 tablets by mouth every 4 (four) hours as needed.  30 tablet  0  . tamoxifen (NOLVADEX) 20 MG tablet Take 1 tablet (20 mg total) by mouth daily.  90 tablet  3   No current facility-administered medications for this visit.    OBJECTIVE: Filed Vitals:   07/25/13 1103  BP: 121/84  Pulse: 80  Temp: 98.3 F (36.8 C)  Resp: 20     Body mass index is 33.73 kg/(m^2).     GENERAL: Patient is a well appearing female in no acute distress HEENT:  Sclerae anicteric.  Oropharynx clear and moist. No ulcerations or evidence of oropharyngeal candidiasis. Neck is supple.  NODES:  No cervical, supraclavicular, or axillary lymphadenopathy palpated.  BREAST EXAM:  Deferred. LUNGS:  Clear to auscultation bilaterally.  No wheezes or rhonchi. HEART:  Regular rate and rhythm. No murmur appreciated. ABDOMEN:  Soft, nontender.  Positive, normoactive bowel sounds. No organomegaly palpated. MSK:  No focal spinal tenderness to palpation. Full range of motion bilaterally in the upper extremities. EXTREMITIES:  No peripheral edema.   SKIN:  Clear with no obvious rashes or skin changes. No nail dyscrasia. NEURO:  Nonfocal. Well oriented.  Appropriate affect. ECOG FS:1 - Symptomatic but completely ambulatory  LAB RESULTS:  CMP     Component  Value Date/Time   NA 142 07/25/2013 1046   K 3.9 07/25/2013 1046   CO2 21* 07/25/2013 1046   GLUCOSE 90 07/25/2013 1046   BUN 10.7 07/25/2013 1046   CREATININE 0.7 07/25/2013 1046   CALCIUM 9.2 07/25/2013 1046   PROT 6.9 07/25/2013 1046   ALBUMIN 3.9 07/25/2013 1046   AST 21 07/25/2013 1046   ALT 25 07/25/2013 1046   ALKPHOS 64 07/25/2013 1046   BILITOT 0.56 07/25/2013 1046    I No results found for this basename: SPEP,  UPEP,   kappa and lambda light chains    Lab Results  Component Value Date   WBC 3.7* 07/25/2013   NEUTROABS 2.3 07/25/2013   HGB 12.5 07/25/2013   HCT 37.3 07/25/2013   MCV 88.4 07/25/2013   PLT 224 07/25/2013      Chemistry      Component Value Date/Time   NA 142 07/25/2013 1046   K 3.9  07/25/2013 1046   CO2 21* 07/25/2013 1046   BUN 10.7 07/25/2013 1046   CREATININE 0.7 07/25/2013 1046      Component Value Date/Time   CALCIUM 9.2 07/25/2013 1046   ALKPHOS 64 07/25/2013 1046   AST 21 07/25/2013 1046   ALT 25 07/25/2013 1046   BILITOT 0.56 07/25/2013 1046       No results found for this basename: LABCA2    No components found with this basename: LABCA125    No results found for this basename: INR,  in the last 168 hours  Urinalysis    Component Value Date/Time   LABSPEC 1.010 12/13/2012 1435   GLUCOSEU Negative 12/13/2012 1435   UROBILINOGEN 0.2 12/13/2012 1435    STUDIES: No results found.  ASSESSMENT: 48 y.o. 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     #1 Normal mammogram in 07/2011. On physical exam however the possibility of a cyst was noted in the right breast. In 07/2012 patient noted on exam another lump in the right breast. She underwent a diagnostic mammogram on 08/06/2012 that showed a right breast nodule in the outer quadrant. She had an ultrasound performed that showed at the 9:30 o'clock position a 3.6 cm area, and a 10:00 position a 1.3 cm area  with a total area measuring between 5-6 cm. Right breast needle core biopsy on 08/06/2012 performed in [place], [place] showed pathology that revealed an intermediate grade invasive ductal carcinoma. (This is been confirmed by our pathology as well). The tumor was estrogen receptor positive strongly (100%), progesterone receptor negative, HER-2/neu negative, with a Ki-67 that showed a high proliferation rate.   #2 Bilateral breast MRI on 08/27/2012 revealed in the right upper quadrant irregular lobulated mass with a satellite nodule or lobulation within 2 mm at its superior aspect, measured together as 4.5 x 4.0 x 3.8 cm. There was extension of enhancement to the nipple, suggesting nipple involvement may be present. No lymphadenopathy was noted there was no any other area of abnormal enhancement in either breast (clinical stage IIA, T2 N0).   #3 PET scan performed on 08/29/2012 revealed the primary breast cancer measuring 3.2 x 3.3 cm with SUV of 16. It was adjacent nodule along the superiomedial border of the primary mass measuring 1.4 cm which was also hypermetabolic. There were no additional areas of abnormal hypermetabolism. No abnormal hypermetabolic activity was seen in the chest, abdomen/pelvis,within the liver, pancreas, adrenal glands or spleen. No hypermetabolic lymph nodes. In the skeleton, no focal hypermetabolic activity to suggest skeletal metastasis was seen.   #4 Patient also seen by Dr. Rolm Bookbinder and Dr. Thea Silversmith in consultation. Dr. Donne Hazel placed a  Port-A-Cath on 09/12/2012. Patient attended a chemotherapy teaching class.   #5 Completed neoadjuvant chemotherapy consisting of Q14 day Adriamycin/Cytoxan x 4 cycles on 10/25/2012.   #6 Received one dose of neoadjuvant chemotherapy with Taxol on 11/08/2012. She developed significant grade 2 neuropathy and Taxol was discontinued.   #7 s/p neoadjuvant chemotherapy consisting of single agent Abraxane given on day 1, 8, 15  of each 28 day cycle. She completed therapy on 11/15/12 - 02/14/13.   #8 status post right breast lumpectomy with sentinel lymph node biopsy on 03/10/13. The final pathology revealed:  Diagnosis  1. Breast, lumpectomy, Right  - INVASIVE GRADE II DUCTAL CARCINOMA WITH MICROCALCIFICATIONS AND FOCAL  ASSOCIATED MUCIN, SPANNING 2.1 CM IN GREATEST DIMENSION.  - LYMPH/VASCULAR  INVASION IS IDENTIFIED.  - MARGINS ARE NEGATIVE.  - SEE ONCOLOGY TEMPLATE.  1 of 4  FINAL for Elizabeth Miles, Elizabeth Miles ([id number])  Diagnosis(continued)  2. Breast, excision, Right retro-areolar tissue, anterior margin  - BENIGN BREAST PARENCHYMA WITH FIBROCYSTIC CHANGES.  - NO ATYPIA, HYPERPLASIA, OR MALIGNANCY IDENTIFIED.  3. Breast, excision, Right additional lateral margin  - BENIGN BREAST PARENCHYMA WITH FIBROCYSTIC CHANGES.  - NO ATYPIA, HYPERPLASIA, OR MALIGNANCY IDENTIFIED.  4. Lymph node, sentinel, biopsy, Right axillary #1  - ONE BENIGN LYMPH NODE WITH NO TUMOR SEEN (0/1).   #9. Repeat Her2Neu testing on final pathology positive: patient recommended anti-her2 therapy with single agent trastuzumab that began in March 2015.  PLAN: Ms. Bunkley will proceed with Herceptin today.  Her CBC is stable.  I reviewed it with her in detail.    I will refer her to psychiatry for her depression.  She denies any suicidal or homicidal ideation.  Her last echocardiogram was on 07/24/13 and demonstrated a LVEF of 60%, she was evaluated by Dr. Aundra Dubin and he cleared her to continue Herceptin therapy.     She will return every three weeks for labs and Herceptin, and with Dr. Jana Hakim on 09/26/13.   She knows to call us in the interim for any questions or concerns.  We can certainly see her sooner if needed.  I spent 25 minutes counseling the patient face to face.  The total time spent in the appointment was 30 minutes.  Minette Headland, [place] [phone]  07/28/2013 5:55 PM

## 2013-07-25 NOTE — Patient Instructions (Signed)

## 2013-07-25 NOTE — Progress Notes (Signed)
Patient ID: Elizabeth Miles, female   DOB: 06/11/1965, 48 y.o.   MRN: [id number] Referring Physician: Humphrey Rolls Primary Care: [place]  HPI:  Elizabeth Miles is a 48 y/o woman with h/o GERD and R breast CA referred by Dr. Humphrey Rolls for enrollment into the cardio-oncology Clinic.  Diagnosed in 6/14 with R breast CA  (clinical stage IIA, T2 N0). Triple positive.  Completed neoadjuvant chemotherapy consisting of Q14 day Adriamycin/Cytoxan x 4 cycles on 10/25/2012. Received one dose of neoadjuvant chemotherapy with Taxol on 11/08/2012. She developed significant grade 2 neuropathy and Taxol was discontinued. s/p neoadjuvant chemotherapy consisting of single agent Abraxane given on day 1, 8, 15 of each 28 day cycle. She completed therapy on 11/15/12 - 02/14/13. Status post right breast lumpectomy with sentinel lymph node biopsy on 03/10/13 (sentinel node negative). Now on 7/33 XRT.   Denies h/o known cardiac disease. Had echo and stress test with Dr. Harmon Pier which were normal. Recently had an ECG and was told she had an abnormal T wave.   Echo 04/24/23  EF 60%. Lateral s' 8.9 cm GLS - 18.7 Echo 5/15 EF 03%, normal diastolic function, lateral s' 9.9, GLS -20.7%  Denies CP, SOB, edema. 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 Has gained weight since starting chemo. She has some hand and foot swelling.  Review of Systems: All systems reviewed and negative except as per HPI.  Past Medical History  Diagnosis Date  . 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 GERD (gastroesophageal reflux disease)   . GERD (gastroesophageal reflux disease) 08/22/2012  . Allergy   . Complication of anesthesia     1986 ; problem waking up  . Anxiety   . Wears glasses     Current Outpatient Prescriptions  Medication Sig Dispense Refill  . acetaminophen (TYLENOL) 500 MG tablet Take 1,000 mg by mouth every 6 (six) hours as needed.      Marland Kitchen Dexlansoprazole (DEXILANT) 30 MG capsule Take 1 capsule (30 mg total) by  mouth daily.  90 capsule  4  . ibuprofen (ADVIL,MOTRIN) 800 MG tablet Take 800 mg by mouth every 8 (eight) hours as needed for moderate pain (for back pain).      Marland Kitchen oxyCODONE-acetaminophen (PERCOCET) 5-325 MG per tablet Take 1-2 tablets by mouth every 4 (four) hours as needed.  30 tablet  0  . tamoxifen (NOLVADEX) 20 MG tablet Take 1 tablet (20 mg total) by mouth daily.  90 tablet  3   No current facility-administered medications for this encounter.    Allergies  Allergen Reactions  . Darvocet [Propoxyphene N-Acetaminophen] Shortness Of Breath and Swelling  . Shellfish Allergy Shortness Of Breath and Swelling  . Other     CT dye, "chest hurt" "makes me feel cold"    History   Social History  . Marital Status: Married    Spouse Name: N/A    Number of Children: 3  . Years of Education: N/A   Occupational History  . Not on file.   Social History Main Topics  . Smoking status: Never Smoker   . Smokeless tobacco: Never Used  . Alcohol Use: No  . Drug Use: No  . Sexual Activity: Yes     Comment: peri menopausal   Other Topics Concern  . Not on file   Social History Narrative  . No narrative on file    Family History  Problem Relation Age of Onset  . Hypertension Mother   . Bladder  Cancer Maternal Uncle 16  . Cancer Paternal Grandmother 13    lung cancer  . Lung cancer Paternal Grandfather     dx in his 54s  . COPD Paternal Uncle     PHYSICAL EXAM: Filed Vitals:   07/24/13 1218  BP: 100/52  Pulse: 80  Weight: 214 lb 12.8 oz (97.433 kg)  SpO2: 98%   General:  Well appearing. No respiratory difficulty HEENT: normal Neck: supple. no JVD. Carotids 2+ bilat; no bruits. No lymphadenopathy or thryomegaly appreciated. Cor: PMI nondisplaced. Regular rate & rhythm. No rubs, gallops or murmurs. Lungs: clear Abdomen: soft, nontender, nondistended. No hepatosplenomegaly. No bruits or masses. Good bowel sounds. Extremities: no cyanosis, clubbing, rash.  Trace ankle  edema. Neuro: alert & oriented x 3, cranial nerves grossly intact. moves all 4 extremities w/o difficulty. Affect pleasant.  ASSESSMENT & PLAN: 1. Breast CA: I reviewed her echo today.  The echo was a normal study, all indices were stable.  She can continue Herceptin as planned.  Will get repeat echo and office visit in 1 month.  She has mild ankle edema, I do not think that this is cardiac-related.  2. Abnormal ECG: Nonspecific abnormalities on ECG at last appointment. Echo ok. No further w/u needed.  Elby Showers Wylee Dorantes,MD 07/25/2013

## 2013-07-25 NOTE — Telephone Encounter (Signed)
, °

## 2013-07-25 NOTE — Patient Instructions (Signed)
[facility] Discharge Instructions for Patients Receiving Chemotherapy  Today you received the following chemotherapy agents: Herceptin  To help prevent nausea and vomiting after your treatment, we encourage you to take your nausea medication as prescribed by your physician.    If you develop nausea and vomiting that is not controlled by your nausea medication, call the clinic.   BELOW ARE SYMPTOMS THAT SHOULD BE REPORTED IMMEDIATELY:  *FEVER GREATER THAN 100.5 F  *CHILLS WITH OR WITHOUT FEVER  NAUSEA AND VOMITING THAT IS NOT CONTROLLED WITH YOUR NAUSEA MEDICATION  *UNUSUAL SHORTNESS OF BREATH  *UNUSUAL BRUISING OR BLEEDING  TENDERNESS IN MOUTH AND THROAT WITH OR WITHOUT PRESENCE OF ULCERS  *URINARY PROBLEMS  *BOWEL PROBLEMS  UNUSUAL RASH Items with * indicate a potential emergency and should be followed up as soon as possible.  Feel free to call the clinic you have any questions or concerns. The clinic phone number is [phone].    

## 2013-08-08 ENCOUNTER — Telehealth: Payer: Self-pay | Admitting: *Deleted

## 2013-08-08 NOTE — Telephone Encounter (Signed)
Per staff message and POF I have scheduled appts. Advised scheduler of appts. JMW  

## 2013-08-12 ENCOUNTER — Ambulatory Visit (INDEPENDENT_AMBULATORY_CARE_PROVIDER_SITE_OTHER): Payer: BC Managed Care – PPO | Admitting: General Surgery

## 2013-08-12 ENCOUNTER — Encounter (INDEPENDENT_AMBULATORY_CARE_PROVIDER_SITE_OTHER): Payer: Self-pay | Admitting: General Surgery

## 2013-08-12 VITALS — BP 122/76 | HR 87 | Temp 98.3°F | Ht 67.0 in | Wt 216.0 lb

## 2013-08-12 DIAGNOSIS — C50911 Malignant neoplasm of unspecified site of right female breast: Secondary | ICD-10-CM

## 2013-08-12 DIAGNOSIS — C50919 Malignant neoplasm of unspecified site of unspecified female breast: Secondary | ICD-10-CM

## 2013-08-15 ENCOUNTER — Other Ambulatory Visit (HOSPITAL_BASED_OUTPATIENT_CLINIC_OR_DEPARTMENT_OTHER): Payer: BC Managed Care – PPO

## 2013-08-15 ENCOUNTER — Ambulatory Visit (HOSPITAL_BASED_OUTPATIENT_CLINIC_OR_DEPARTMENT_OTHER): Payer: BC Managed Care – PPO

## 2013-08-15 VITALS — BP 120/90 | HR 78 | Temp 97.8°F | Resp 18

## 2013-08-15 DIAGNOSIS — C50919 Malignant neoplasm of unspecified site of unspecified female breast: Secondary | ICD-10-CM

## 2013-08-15 DIAGNOSIS — Z5112 Encounter for antineoplastic immunotherapy: Secondary | ICD-10-CM

## 2013-08-15 DIAGNOSIS — C50419 Malignant neoplasm of upper-outer quadrant of unspecified female breast: Secondary | ICD-10-CM

## 2013-08-15 LAB — COMPREHENSIVE METABOLIC PANEL (CC13)
ALBUMIN: 3.6 g/dL (ref 3.5–5.0)
ALT: 22 U/L (ref 0–55)
AST: 16 U/L (ref 5–34)
Alkaline Phosphatase: 67 U/L (ref 40–150)
Anion Gap: 9 mEq/L (ref 3–11)
BUN: 10.5 mg/dL (ref 7.0–26.0)
CO2: 25 meq/L (ref 22–29)
Calcium: 9.4 mg/dL (ref 8.4–10.4)
Chloride: 108 mEq/L (ref 98–109)
Creatinine: 0.8 mg/dL (ref 0.6–1.1)
GLUCOSE: 97 mg/dL (ref 70–140)
Potassium: 4 mEq/L (ref 3.5–5.1)
SODIUM: 142 meq/L (ref 136–145)
Total Bilirubin: 0.5 mg/dL (ref 0.20–1.20)
Total Protein: 6.6 g/dL (ref 6.4–8.3)

## 2013-08-15 LAB — CBC WITH DIFFERENTIAL/PLATELET
BASO%: 1.1 % (ref 0.0–2.0)
Basophils Absolute: 0 10*3/uL (ref 0.0–0.1)
EOS ABS: 0.3 10*3/uL (ref 0.0–0.5)
EOS%: 6.7 % (ref 0.0–7.0)
HCT: 39.7 % (ref 34.8–46.6)
HGB: 13.2 g/dL (ref 11.6–15.9)
LYMPH#: 1 10*3/uL (ref 0.9–3.3)
LYMPH%: 28 % (ref 14.0–49.7)
MCH: 29.7 pg (ref 25.1–34.0)
MCHC: 33.2 g/dL (ref 31.5–36.0)
MCV: 89.4 fL (ref 79.5–101.0)
MONO#: 0.4 10*3/uL (ref 0.1–0.9)
MONO%: 9.9 % (ref 0.0–14.0)
NEUT%: 54.3 % (ref 38.4–76.8)
NEUTROS ABS: 2 10*3/uL (ref 1.5–6.5)
NRBC: 0 % (ref 0–0)
Platelets: 237 10*3/uL (ref 145–400)
RBC: 4.44 10*6/uL (ref 3.70–5.45)
RDW: 13.3 % (ref 11.2–14.5)
WBC: 3.7 10*3/uL — ABNORMAL LOW (ref 3.9–10.3)

## 2013-08-15 MED ORDER — TRASTUZUMAB CHEMO INJECTION 440 MG
6.0000 mg/kg | Freq: Once | INTRAVENOUS | Status: AC
  Administered 2013-08-15: 567 mg via INTRAVENOUS
  Filled 2013-08-15: qty 27

## 2013-08-15 MED ORDER — DIPHENHYDRAMINE HCL 25 MG PO CAPS
ORAL_CAPSULE | ORAL | Status: AC
  Filled 2013-08-15: qty 2

## 2013-08-15 MED ORDER — ACETAMINOPHEN 325 MG PO TABS
650.0000 mg | ORAL_TABLET | Freq: Once | ORAL | Status: AC
  Administered 2013-08-15: 650 mg via ORAL

## 2013-08-15 MED ORDER — DIPHENHYDRAMINE HCL 25 MG PO CAPS
50.0000 mg | ORAL_CAPSULE | Freq: Once | ORAL | Status: AC
  Administered 2013-08-15: 50 mg via ORAL

## 2013-08-15 MED ORDER — ACETAMINOPHEN 325 MG PO TABS
ORAL_TABLET | ORAL | Status: AC
  Filled 2013-08-15: qty 2

## 2013-08-15 MED ORDER — SODIUM CHLORIDE 0.9 % IV SOLN
Freq: Once | INTRAVENOUS | Status: AC
  Administered 2013-08-15: 10:00:00 via INTRAVENOUS

## 2013-08-15 NOTE — Patient Instructions (Signed)
[facility] Discharge Instructions for Patients Receiving Chemotherapy  Today you received the following chemotherapy agent: Herceptin   To help prevent nausea and vomiting after your treatment, we encourage you to take your nausea medication as prescribed.    If you develop nausea and vomiting that is not controlled by your nausea medication, call the clinic.   BELOW ARE SYMPTOMS THAT SHOULD BE REPORTED IMMEDIATELY:  *FEVER GREATER THAN 100.5 F  *CHILLS WITH OR WITHOUT FEVER  NAUSEA AND VOMITING THAT IS NOT CONTROLLED WITH YOUR NAUSEA MEDICATION  *UNUSUAL SHORTNESS OF BREATH  *UNUSUAL BRUISING OR BLEEDING  TENDERNESS IN MOUTH AND THROAT WITH OR WITHOUT PRESENCE OF ULCERS  *URINARY PROBLEMS  *BOWEL PROBLEMS  UNUSUAL RASH Items with * indicate a potential emergency and should be followed up as soon as possible.  Feel free to call the clinic you have any questions or concerns. The clinic phone number is [phone].    

## 2013-08-17 ENCOUNTER — Emergency Department (HOSPITAL_COMMUNITY)
Admission: EM | Admit: 2013-08-17 | Discharge: 2013-08-17 | Disposition: A | Discharge status: Home / Self Care | Payer: BC Managed Care – PPO | Attending: Emergency Medicine | Admitting: Emergency Medicine

## 2013-08-17 ENCOUNTER — Encounter (HOSPITAL_COMMUNITY): Payer: Self-pay | Admitting: Emergency Medicine

## 2013-08-17 ENCOUNTER — Emergency Department (HOSPITAL_COMMUNITY): Payer: BC Managed Care – PPO

## 2013-08-17 DIAGNOSIS — G8929 Other chronic pain: Secondary | ICD-10-CM | POA: Insufficient documentation

## 2013-08-17 DIAGNOSIS — R200 Anesthesia of skin: Secondary | ICD-10-CM

## 2013-08-17 DIAGNOSIS — Z79899 Other long term (current) drug therapy: Secondary | ICD-10-CM | POA: Insufficient documentation

## 2013-08-17 DIAGNOSIS — Z853 Personal history of malignant neoplasm of breast: Secondary | ICD-10-CM | POA: Insufficient documentation

## 2013-08-17 DIAGNOSIS — K219 Gastro-esophageal reflux disease without esophagitis: Secondary | ICD-10-CM | POA: Insufficient documentation

## 2013-08-17 DIAGNOSIS — T4995XA Adverse effect of unspecified topical agent, initial encounter: Secondary | ICD-10-CM | POA: Insufficient documentation

## 2013-08-17 DIAGNOSIS — R209 Unspecified disturbances of skin sensation: Secondary | ICD-10-CM | POA: Insufficient documentation

## 2013-08-17 DIAGNOSIS — F411 Generalized anxiety disorder: Secondary | ICD-10-CM | POA: Insufficient documentation

## 2013-08-17 LAB — I-STAT CHEM 8, ED
BUN: 8 mg/dL (ref 6–23)
CALCIUM ION: 1.28 mmol/L — AB (ref 1.12–1.23)
Chloride: 105 mEq/L (ref 96–112)
Creatinine, Ser: 0.8 mg/dL (ref 0.50–1.10)
Glucose, Bld: 155 mg/dL — ABNORMAL HIGH (ref 70–99)
HEMATOCRIT: 42 % (ref 36.0–46.0)
Hemoglobin: 14.3 g/dL (ref 12.0–15.0)
Potassium: 3.8 mEq/L (ref 3.7–5.3)
Sodium: 144 mEq/L (ref 137–147)
TCO2: 24 mmol/L (ref 0–100)

## 2013-08-17 LAB — CBC WITH DIFFERENTIAL/PLATELET
BASOS ABS: 0 10*3/uL (ref 0.0–0.1)
Basophils Relative: 0 % (ref 0–1)
EOS ABS: 0 10*3/uL (ref 0.0–0.7)
EOS PCT: 0 % (ref 0–5)
HCT: 40.1 % (ref 36.0–46.0)
Hemoglobin: 13.3 g/dL (ref 12.0–15.0)
LYMPHS PCT: 12 % (ref 12–46)
Lymphs Abs: 0.6 10*3/uL — ABNORMAL LOW (ref 0.7–4.0)
MCH: 29.6 pg (ref 26.0–34.0)
MCHC: 33.2 g/dL (ref 30.0–36.0)
MCV: 89.1 fL (ref 78.0–100.0)
MONO ABS: 0.1 10*3/uL (ref 0.1–1.0)
Monocytes Relative: 1 % — ABNORMAL LOW (ref 3–12)
Neutro Abs: 4.6 10*3/uL (ref 1.7–7.7)
Neutrophils Relative %: 87 % — ABNORMAL HIGH (ref 43–77)
PLATELETS: 267 10*3/uL (ref 150–400)
RBC: 4.5 MIL/uL (ref 3.87–5.11)
RDW: 13.1 % (ref 11.5–15.5)
WBC: 5.3 10*3/uL (ref 4.0–10.5)

## 2013-08-17 MED ORDER — GADOBENATE DIMEGLUMINE 529 MG/ML IV SOLN
20.0000 mL | Freq: Once | INTRAVENOUS | Status: AC | PRN
Start: 2013-08-17 — End: 2013-08-17
  Administered 2013-08-17: 20 mL via INTRAVENOUS

## 2013-08-17 MED ORDER — DIAZEPAM 5 MG/ML IJ SOLN
5.0000 mg | Freq: Once | INTRAMUSCULAR | Status: DC
  Filled 2013-08-17: qty 2

## 2013-08-17 NOTE — ED Notes (Signed)
Patient transported to CT 

## 2013-08-17 NOTE — ED Provider Notes (Signed)
Medical screening examination/treatment/procedure(s) were performed by non-physician practitioner and as supervising physician I was immediately available for consultation/collaboration.   EKG Interpretation None        Alfonzo Feller, DO 08/17/13 1510

## 2013-08-17 NOTE — ED Notes (Signed)
Pt still in MRI 

## 2013-08-17 NOTE — ED Notes (Signed)
Updated patient on waiting for MRI. Robyn PA to update patient on Lab results.

## 2013-08-17 NOTE — ED Notes (Signed)
Patient states that she started to have facial tingling at about 830 this evening and took a zyrtec. She had an infusion of a new chemo. The patient went to [facility] hospital where she was given benadryl, decadron, and zantac - the patient reports that she still is having the numbness and tingling to her mouth and tongue. The apteint reports that she called the cancer center to let them know that she had had this reaction and was advised to come here to be further evaluated.

## 2013-08-17 NOTE — ED Provider Notes (Signed)
Care assumed from Brimhall Nizhoni, NP at shift change. Pt with left sided facial numbness, hx of breast cancer. Last chemo was Friday. MRI brain pending to r/o stroke. No known mets. Oncologist contacted by prior Helios Kohlmann. If normal, d/c home with oncology f/u. 10:30 AM MRI without any acute findings. I discussed findings with pt. States she has a hx of chronic headaches. Symptoms may be migraine related. She is stable for d/c. F/u with PCP. Return precautions given. Patient states understanding of treatment care plan and is agreeable.  Ct Head Wo Contrast  08/17/2013   CLINICAL DATA:  Facial numbness.  EXAM: CT HEAD WITHOUT CONTRAST  TECHNIQUE: Contiguous axial images were obtained from the base of the skull through the vertex without intravenous contrast.  COMPARISON:  None.  FINDINGS: There is no acute intracranial hemorrhage or infarct. No mass lesion or midline shift. Gray-white matter differentiation is well maintained. Ventricles are normal in size without evidence of hydrocephalus. CSF containing spaces are within normal limits. No extra-axial fluid collection.  The calvarium is intact.  Orbital soft tissues are within normal limits.  The paranasal sinuses and mastoid air cells are well pneumatized and free of fluid.  Scalp soft tissues are unremarkable.  IMPRESSION: Normal head CT with no acute intracranial abnormality identified.   Electronically Signed   By: Jeannine Boga M.D.   On: 08/17/2013 05:02   Mr Jeri Cos PT Contrast  08/17/2013   CLINICAL DATA:  New onset facial numbness  EXAM: MRI HEAD WITHOUT AND WITH CONTRAST  TECHNIQUE: Multiplanar, multiecho pulse sequences of the brain and surrounding structures were obtained without and with intravenous contrast.  CONTRAST:  41mL MULTIHANCE GADOBENATE DIMEGLUMINE 529 MG/ML IV SOLN  COMPARISON:  CT head 08/17/2013  FINDINGS: Ventricle size is normal. Craniocervical junction is normal. Pituitary normal in size.  Negative for acute infarct. Scattered  small white matter hyperintensities consistent with chronic microvascular ischemia or migraine headache.  Negative for hemorrhage or mass.  Normal enhancement following contrast infusion. No enhancing mass. Leptomeningeal enhancement is normal.  Paranasal sinuses are clear.  IMPRESSION: No acute abnormality. Several small white matter hyperintensities bilaterally consistent with chronic microvascular ischemia or migraine headache.   Electronically Signed   By: Franchot Gallo M.D.   On: 08/17/2013 10:03    Illene Labrador, PA-C 08/17/13 1031

## 2013-08-17 NOTE — Discharge Instructions (Signed)
Paresthesia Paresthesia is a burning or prickling feeling. This feeling can happen in any part of the body. It often happens in the hands, arms, legs, or feet. HOME CARE  Avoid drinking alcohol.  Try massage or needle therapy (acupuncture) to help with your problems.  Keep all doctor visits as told. GET HELP RIGHT AWAY IF:   You feel weak.  You have trouble walking or moving.  You have problems speaking or seeing.  You feel confused.  You cannot control when you poop (bowel movement) or pee (urinate).  You lose feeling (numbness) after an injury.  You pass out (faint).  Your burning or prickling feeling gets worse when you walk.  You have pain, cramps, or feel dizzy.  You have a rash. MAKE SURE YOU:   Understand these instructions.  Will watch your condition.  Will get help right away if you are not doing well or get worse. Document Released: 01/27/2008 Document Revised: 05/08/2011 Document Reviewed: 11/04/2010 Exit[employer] Patient Information 2015 [employer]. This information is not intended to replace advice given to you by your health care provider. Make sure you discuss any questions you have with your health care provider.  

## 2013-08-17 NOTE — ED Notes (Signed)
PA Robyn at bedside  

## 2013-08-17 NOTE — ED Notes (Signed)
Pt transported to  MRI with Edison Nasuti NT. Valium order obtained per pt request. PT would now like to try to make it through MRI with out Valium.

## 2013-08-18 ENCOUNTER — Other Ambulatory Visit: Payer: Self-pay | Admitting: *Deleted

## 2013-08-18 ENCOUNTER — Telehealth: Payer: Self-pay | Admitting: *Deleted

## 2013-08-18 DIAGNOSIS — R202 Paresthesia of skin: Secondary | ICD-10-CM

## 2013-08-18 DIAGNOSIS — R200 Anesthesia of skin: Secondary | ICD-10-CM

## 2013-08-18 DIAGNOSIS — C50919 Malignant neoplasm of unspecified site of unspecified female breast: Secondary | ICD-10-CM

## 2013-08-18 NOTE — Telephone Encounter (Signed)
Message left by pt requesting a return call.  This RN returned call to [phone] and obtained VM.  General message left for pt to return call to this RN per her concerns.  Note pt was seen in the ER 6/21 for headache with negative CT and MRI of the brain.

## 2013-08-18 NOTE — Progress Notes (Signed)
Pt called to this RN to state onset of symptoms over the weekend of " numbness and tingling in my left cheek,gum and the back of my tongue " " over the weekend I went to the ER at [facility] because I thought I was having an allergic reaction to the herceptin" "they gave me some decadron and benadryl but that didn't help so I came to the ER at [facility]- and they did scans because they didn't think it was an allergic reaction but maybe a pinched nerve or a focalized migraine"  Per ER she was advised to follow up with her primary MD and recommended her to see a neurologist.  Elizabeth Miles is calling to follow up post primary MD recommending her to see a neurologist and she was inquiring which one in [place].  This RN inquired with further questions regarding symptoms with Versia denying headaches nor does she have pain-numbness or tingling in her arms. She does not see a rash on her face nor in her mouth besides one sore on her gum not associated with above area of numbness.  This RN advised seeing [facility]- but also per review of symptoms- AB/PA recommended proceeding with an MRI of the cervical spine with base of skull.  Order placed per recommendation.  Attempted to contact pt and obtained VM. Message left to return call to this RN.

## 2013-08-19 NOTE — Progress Notes (Signed)
Subjective:     Patient ID: Elizabeth Miles, female   DOB: 02-08-1966, 48 y.o.   MRN: [id number]  HPI 37 yof who underwent primary chemotherapy followed by right breast lumpectomy, right axillary snbx, and port removal.  Her pathology showed invasive grade 2 ductal carcinoma spanning 2.1 cm with LVI. Her margins and nodes are negative. This was a stage IIA tumor.  She has almost completed herceptin and is on tamoxifen now.  She has no real complaints referable to either breast right now. She does have depressive symptoms that continue.  She has anhedonia and is not able to exercise right now due to back.  She comes in today for routine follow up.  Review of Systems     Objective:   Physical Exam  Constitutional: She appears well-developed and well-nourished.  Neck: Neck supple.  Cardiovascular: Normal rate, regular rhythm and normal heart sounds.   Pulmonary/Chest: Effort normal and breath sounds normal. Right breast exhibits no inverted nipple, no mass, no nipple discharge, no skin change and no tenderness. Left breast exhibits no inverted nipple, no mass, no nipple discharge, no skin change and no tenderness.  Lymphadenopathy:    She has no cervical adenopathy.   Healing right breast and right axillary incisions without infection  Healing left chest incision status post port removal     Assessment:     Stage IIA right breast cancer     Plan:     She has no evidence recurrence.  She is going to continue current therapy. I recommended some of survivorship options at cancer center. I recommended considering contact with [facility] and also recommended follow up with a counselor or psychiatrist.  She is considering all of this.

## 2013-08-21 ENCOUNTER — Other Ambulatory Visit: Payer: Self-pay | Admitting: Adult Health

## 2013-08-21 ENCOUNTER — Telehealth: Payer: Self-pay | Admitting: Adult Health

## 2013-08-21 ENCOUNTER — Ambulatory Visit (HOSPITAL_COMMUNITY)
Admission: RE | Admit: 2013-08-21 | Discharge: 2013-08-21 | Disposition: A | Discharge status: Home / Self Care | Payer: BC Managed Care – PPO | Source: Ambulatory Visit | Attending: Oncology | Admitting: Oncology

## 2013-08-21 DIAGNOSIS — M5380 Other specified dorsopathies, site unspecified: Secondary | ICD-10-CM | POA: Insufficient documentation

## 2013-08-21 DIAGNOSIS — M47812 Spondylosis without myelopathy or radiculopathy, cervical region: Secondary | ICD-10-CM | POA: Insufficient documentation

## 2013-08-21 DIAGNOSIS — R209 Unspecified disturbances of skin sensation: Secondary | ICD-10-CM | POA: Insufficient documentation

## 2013-08-21 DIAGNOSIS — M5020 Other cervical disc displacement, unspecified cervical region: Secondary | ICD-10-CM | POA: Insufficient documentation

## 2013-08-21 DIAGNOSIS — Z853 Personal history of malignant neoplasm of breast: Secondary | ICD-10-CM | POA: Insufficient documentation

## 2013-08-21 MED ORDER — GADOBENATE DIMEGLUMINE 529 MG/ML IV SOLN
20.0000 mL | Freq: Once | INTRAVENOUS | Status: AC | PRN
  Administered 2013-08-21: 20 mL via INTRAVENOUS

## 2013-08-25 ENCOUNTER — Inpatient Hospital Stay (HOSPITAL_COMMUNITY): Admission: RE | Admit: 2013-08-25 | Payer: BC Managed Care – PPO | Source: Ambulatory Visit

## 2013-08-25 ENCOUNTER — Telehealth (HOSPITAL_COMMUNITY): Payer: Self-pay | Admitting: Vascular Surgery

## 2013-08-26 ENCOUNTER — Ambulatory Visit (INDEPENDENT_AMBULATORY_CARE_PROVIDER_SITE_OTHER): Payer: BC Managed Care – PPO | Admitting: Neurology

## 2013-08-26 ENCOUNTER — Encounter: Payer: Self-pay | Admitting: Neurology

## 2013-08-26 VITALS — BP 135/93 | HR 98 | Temp 98.3°F | Ht 67.0 in | Wt 216.0 lb

## 2013-08-26 DIAGNOSIS — C50911 Malignant neoplasm of unspecified site of right female breast: Secondary | ICD-10-CM

## 2013-08-26 DIAGNOSIS — G501 Atypical facial pain: Secondary | ICD-10-CM

## 2013-08-26 DIAGNOSIS — C50919 Malignant neoplasm of unspecified site of unspecified female breast: Secondary | ICD-10-CM

## 2013-08-26 DIAGNOSIS — R209 Unspecified disturbances of skin sensation: Secondary | ICD-10-CM

## 2013-08-26 DIAGNOSIS — R200 Anesthesia of skin: Secondary | ICD-10-CM

## 2013-08-26 DIAGNOSIS — R202 Paresthesia of skin: Secondary | ICD-10-CM

## 2013-08-26 MED ORDER — GABAPENTIN 100 MG PO CAPS: ORAL_CAPSULE | ORAL | Status: DC

## 2013-08-26 NOTE — Progress Notes (Signed)
Subjective:    Patient ID: Elizabeth Miles is a 48 y.o. female.  HPI    Star Age, MD, PhD [facility] [address] [place], [place] [address]  Dear Dr. Harmon Pier,   I saw your patient, Elizabeth Miles, upon your kind request in my neurologic clinic today for initial consultation of her facial paresthesias. The patient is unaccompanied today. As you know, Elizabeth Miles is a 48 year old right-handed woman with an underlying medical history of reflux disease and breast cancer (diagnosed in 6/14, treated with lumpectomy on the R, s/p chemo and radiation), who has noticed left facial tingling and numbness including tongue numbness for the past few months, since April, intermittently. In the past she took Benadryl or Zyrtec, which improved the sensation. She went to [facility] ER and was released, then she was seen at [facility] ER on 08/17/2013 for these symptoms. It was felt that her symptoms could be migraine related as she also has a history of recurrent headaches. She had a head CT without contrast which was reported as normal, she had a brain MRI with and without contrast on 08/17/2013: No acute abnormality. Several small white matter hyperintensities bilaterally consistent with chronic microvascular ischemia or migraine headache. In addition I personally reviewed the images through the PACS system of her CT head, MRI brain and MRI cervical spine and showed her images of her brain MRI. She had a cervical spine MRI with and without contrast on 08/21/2013: Negative for metastatic disease in the cervical spine. Cervical degenerative changes most prominent at C5-6 where there is central and left-sided osteophyte causing flattening of cord and mild left foraminal encroachment.  She has been on Herceptin since 3/15 and has an infusion every 3 weeks and her facial paresthesias first happened in April and was transient, she took Zyrtec and Sx cleared after an  hour. She has had repeated Herceptin treatments since then, and wonders if this is connected. She has had constant tingling since about a day after last Herceptin. She has some numbness, and constant tingling in the L lower face, not really painful, but a nuisance, and irritating. No shooting, no change in taste, no triggers, no alleviating factors. It seems to be fluctuating.  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 She has a very rare throbbing sensation in the right parietotemporal area. This is not necessarily related to her left facial tingling. Today her tingling feels fairly mild and she has no clear numbness today. She also feels that she may be getting better and is wondering if at the next infusion she could have a slower rate of infusion. She felt that last time the Herceptin infusion was faster than usual.  Her Past Medical History Is Significant For: Past Medical History  Diagnosis Date  . 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 GERD (gastroesophageal reflux disease)   . GERD (gastroesophageal reflux disease) 08/22/2012  . Allergy   . Complication of anesthesia     1986 ; problem waking up  . Anxiety   . Wears glasses     Her Past Surgical History Is Significant For: Past Surgical History  Procedure Laterality Date  . Dilation and curettage of uterus  1993  . Cervical ablation  1983  . Breast biopsy Right 08/06/12  . Popliteal synovial cyst excision  1970  . Portacath placement Left 09/12/2012    Procedure: INSERTION PORT-A-CATH;  Surgeon: Rolm Bookbinder, MD;  Location: [place];  Service: General;  Laterality: Left;  . Breast lumpectomy with needle localization and axillary sentinel lymph node bx Right 03/10/2013    Procedure: BREAST LUMPECTOMY WITH NEEDLE LOCALIZATION AND AXILLARY SENTINEL LYMPH NODE BIOPSY;  Surgeon: Rolm Bookbinder, MD;  Location: [place];  Service: General;  Laterality:  Right;  . Port-a-cath removal Left 03/10/2013    Procedure: REMOVAL PORT-A-CATH;  Surgeon: Rolm Bookbinder, MD;  Location: [place];  Service: General;  Laterality: Left;    Her Family History Is Significant For: Family History  Problem Relation Age of Onset  . Hypertension Mother   . Bladder Cancer Maternal Uncle 68  . Cancer Paternal Grandmother 35    lung cancer  . Lung cancer Paternal Grandfather     dx in his 12s  . COPD Paternal Uncle     Her Social History Is Significant For: History   Social History  . Marital Status: Married    Spouse Name: N/A    Number of Children: 3  . Years of Education: N/A   Social History Main Topics  . Smoking status: Never Smoker   . Smokeless tobacco: Never Used  . Alcohol Use: No  . Drug Use: No  . Sexual Activity: Yes     Comment: peri menopausal   Other Topics Concern  . None   Social History Narrative  . None    Her Allergies Are:  Allergies  Allergen Reactions  . Darvocet [Propoxyphene N-Acetaminophen] Shortness Of Breath and Swelling  . Shellfish Allergy Shortness Of Breath and Swelling  . Other     CT dye, "chest hurt" "makes me feel cold"  :   Her Current Medications Are:  Outpatient Encounter Prescriptions as of 08/26/2013  Medication Sig  . acetaminophen (TYLENOL) 500 MG tablet Take 1,000 mg by mouth every 6 (six) hours as needed.  Marland Kitchen Dexlansoprazole (DEXILANT) 30 MG capsule Take 1 capsule (30 mg total) by mouth daily.  Marland Kitchen ibuprofen (ADVIL,MOTRIN) 800 MG tablet Take 800 mg by mouth every 8 (eight) hours as needed for moderate pain (for back pain).  . tamoxifen (NOLVADEX) 20 MG tablet Take 1 tablet (20 mg total) by mouth daily.  :   Review of Systems:  Out of a complete 14 point review of systems, all are reviewed and negative with the exception of these symptoms as listed below:  Review of Systems  Constitutional: Negative.   HENT: Negative.   Eyes: Negative.   Respiratory: Negative.    Cardiovascular: Negative.   Gastrointestinal: Negative.   Endocrine: Negative.   Genitourinary: Negative.   Musculoskeletal: Negative.   Skin: Negative.   Allergic/Immunologic: Negative.   Neurological: Positive for numbness.  Hematological: Negative.   Psychiatric/Behavioral: Negative.     Objective:  Neurologic Exam  Physical Exam Physical Examination:   Filed Vitals:   08/26/13 1354  BP: 135/93  Pulse: 98  Temp: 98.3 F (36.8 C)    General Examination: The patient is a very pleasant 48 y.o. female in no acute distress. She appears well-developed and well-nourished and well groomed.   HEENT: Normocephalic, atraumatic, pupils are equal, round and reactive to light and accommodation. Funduscopic exam is normal with sharp disc margins noted. Extraocular tracking is good without limitation to gaze excursion or nystagmus noted. Normal smooth pursuit is noted. Hearing is grossly intact. Tympanic membranes are clear bilaterally, in particular, there are no vesicles or redness and no tenderness. Face is symmetric with normal  facial animation and normal facial sensation, whilst having mild subjective tingling in the left lower face, sparing her for head and her eye area and including the left jawline. There is no twitching. Speech is clear with no dysarthria noted. There is no hypophonia. There is no lip, neck/head, jaw or voice tremor. Neck is supple with full range of passive and active motion. There are no carotid bruits on auscultation. Oropharynx exam reveals: mild mouth dryness, adequate dental hygiene. Mallampati is class I. Tongue protrudes centrally and palate elevates symmetrically. Nasal inspection reveals no significant nasal mucosal bogginess or redness and no septal deviation.   Chest: Clear to auscultation without wheezing, rhonchi or crackles noted.  Heart: S1+S2+0, regular and normal without murmurs, rubs or gallops noted.   Abdomen: Soft, non-tender and non-distended  with normal bowel sounds appreciated on auscultation.  Extremities: There is trace pitting edema in the distal lower extremities bilaterally. Pedal pulses are intact.  Skin: Warm and dry without trophic changes noted. There are no varicose veins.  Musculoskeletal: exam reveals no obvious joint deformities, tenderness or joint swelling or erythema.   Neurologically:  Mental status: The patient is awake, alert and oriented in all 4 spheres. Her immediate and remote memory, attention, language skills and fund of knowledge are appropriate. There is no evidence of aphasia, agnosia, apraxia or anomia. Speech is clear with normal prosody and enunciation. Thought process is linear. Mood is normal and affect is normal.  Cranial nerves II - XII are as described above under HEENT exam. In addition: shoulder shrug is normal with equal shoulder height noted. Motor exam: Normal bulk, strength and tone is noted. There is no drift, tremor or rebound. Romberg is negative. Reflexes are 2+ throughout. Babinski: Toes are flexor bilaterally. Fine motor skills and coordination: intact with normal finger taps, normal hand movements, normal rapid alternating patting, normal foot taps and normal foot agility.  Cerebellar testing: No dysmetria or intention tremor on finger to nose testing. Heel to shin is unremarkable bilaterally. There is no truncal or gait ataxia.  Sensory exam: intact to light touch, pinprick, vibration, temperature sense in the upper and lower extremities.  Gait, station and balance: She stands easily. No veering to one side is noted. No leaning to one side is noted. Posture is age-appropriate and stance is narrow based. Gait shows normal stride length and normal pace. No problems turning are noted. She turns en bloc. Tandem walk is unremarkable. Intact toe and heel stance is noted.               Assessment and Plan:    In summary, Elizabeth Miles is a very pleasant 48 y.o.-year old female with an  underlying medical history of reflux disease and breast cancer (diagnosed in 6/14, treated with lumpectomy on the R, s/p chemo and radiation), who has noticed left facial tingling and numbness including tongue numbness for the past few months, since April, intermittently. Her exam is completely nonfocal. Currently she reports some mild or tingling in her left lower face subjectively and there is no focal neurological accompaniment. In particular she also had a recent brain MRI with and without contrast which was in keeping with mild white matter changes. She has a long-standing history of migraine headaches. I'm not sure what is causing her symptoms. Differential diagnoses does include migraine aura without headache, nonspecific trigeminal nerve dysfunction, perhaps secondary to medication effect from the Herceptin, atypical facial pain, atypical trigeminal neuralgia. She does not have any actual pain but  her paresthesias can become quite annoying. For this, I suggested a trial of gabapentin as needed 100 mg strength. She states that she has had some side effects from medications or paradoxical reactions to medications in the past. She could not tolerate Ativan in the past and became quite agitated after it. Decadron made her very sleepy. Nevertheless, she is willing to try gabapentin and I would suggest no more than 100 mg at a time. I talked to her about potential side effects and she should try it when she does not have to drive. She is also advised to keep a log in particular with respect to her future Herceptin infusions. From what I can see, this medication does not typically cause paresthesias but I think it is not fully excluded. I would also like to proceed with an EEG. We will call her with the test results. I reassured her that in light of a normal neurological exam and a reassuring brain MRI we are probably not overlooking something sinister.  I answered all her questions today and the patient was in  agreement with the above outlined plan. I would like to see the patient back in 3 months, sooner if the need arises and encouraged her to call with any interim questions, concerns, problems or updates and her refill requests and her test results.   Thank you very much for allowing me to participate in the care of this nice patient. If I can be of any further assistance to you please do not hesitate to call me at [phone].  Sincerely,   Star Age, MD, PhD

## 2013-08-26 NOTE — ED Provider Notes (Signed)
CSN: [id number]     Arrival date & time 08/17/13  0154 History   First MD Initiated Contact with Patient 08/17/13 0257     Chief Complaint  Patient presents with  . Allergic Reaction     (Consider location/radiation/quality/duration/timing/severity/associated sxs/prior Treatment) HPI  Past Medical History  Diagnosis Date  . 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 GERD (gastroesophageal reflux disease)   . GERD (gastroesophageal reflux disease) 08/22/2012  . Allergy   . Complication of anesthesia     1986 ; problem waking up  . Anxiety   . Wears glasses    Past Surgical History  Procedure Laterality Date  . Dilation and curettage of uterus  1993  . Cervical ablation  1983  . Breast biopsy Right 08/06/12  . Popliteal synovial cyst excision  1970  . Portacath placement Left 09/12/2012    Procedure: INSERTION PORT-A-CATH;  Surgeon: Rolm Bookbinder, MD;  Location: [place];  Service: General;  Laterality: Left;  . Breast lumpectomy with needle localization and axillary sentinel lymph node bx Right 03/10/2013    Procedure: BREAST LUMPECTOMY WITH NEEDLE LOCALIZATION AND AXILLARY SENTINEL LYMPH NODE BIOPSY;  Surgeon: Rolm Bookbinder, MD;  Location: [place];  Service: General;  Laterality: Right;  . Port-a-cath removal Left 03/10/2013    Procedure: REMOVAL PORT-A-CATH;  Surgeon: Rolm Bookbinder, MD;  Location: [place];  Service: General;  Laterality: Left;   Family History  Problem Relation Age of Onset  . Hypertension Mother   . Bladder Cancer Maternal Uncle 68  . Cancer Paternal Grandmother 67    lung cancer  . Lung cancer Paternal Grandfather     dx in his 93s  . COPD Paternal Uncle    History  Substance Use Topics  . Smoking status: Never Smoker   . Smokeless tobacco: Never Used  . Alcohol Use: No   OB History   Grav Para Term Preterm Abortions TAB SAB Ect Mult Living                 Review of Systems  Unable to  perform ROS HENT:       Numbness of tongue and mouth  Respiratory: Negative for cough and shortness of breath.   Cardiovascular: Negative for chest pain.  Genitourinary: Negative for dysuria.  Neurological: Positive for numbness. Negative for dizziness and headaches.  All other systems reviewed and are negative.     Allergies  Darvocet; Shellfish allergy; and Other  Home Medications   Prior to Admission medications   Medication Sig Start Date End Date Taking? Authorizing Provider  acetaminophen (TYLENOL) 500 MG tablet Take 1,000 mg by mouth every 6 (six) hours as needed.   Yes Historical Provider, MD  Dexlansoprazole (DEXILANT) 30 MG capsule Take 1 capsule (30 mg total) by mouth daily. 12/16/12  Yes Minette Headland, NP  ibuprofen (ADVIL,MOTRIN) 800 MG tablet Take 800 mg by mouth every 8 (eight) hours as needed for moderate pain (for back pain).   Yes Historical Provider, MD  tamoxifen (NOLVADEX) 20 MG tablet Take 1 tablet (20 mg total) by mouth daily. 05/23/13  Yes Amada Kingfisher, MD  gabapentin (NEURONTIN) 100 MG capsule Take as needed for facial tingling, once daily 08/26/13   Star Age, MD   BP 129/83  Pulse 89  Temp(Src) 98.3 F (36.8 C) (Oral)  Resp 16  SpO2 98%  LMP 03/12/2012 Physical Exam  Constitutional: She is oriented to person, place, and time. She appears  well-developed.  HENT:  Head: Normocephalic.  Mouth/Throat: Uvula is midline and mucous membranes are normal. No uvula swelling. No posterior oropharyngeal edema.  Eyes: Pupils are equal, round, and reactive to light.  Cardiovascular: Normal rate.   Pulmonary/Chest: Effort normal.  Neurological: She is alert and oriented to person, place, and time. No cranial nerve deficit or sensory deficit.  Skin: Skin is warm.    ED Course  Procedures (including critical care time) Labs Review Labs Reviewed  CBC WITH DIFFERENTIAL - Abnormal; Notable for the following:    Neutrophils Relative % 87 (*)    Lymphs Abs  0.6 (*)    Monocytes Relative 1 (*)    All other components within normal limits  I-STAT CHEM 8, ED - Abnormal; Notable for the following:    Glucose, Bld 155 (*)    Calcium, Ion 1.28 (*)    All other components within normal limits    Imaging Review No results found.   EKG Interpretation None      MDM   Final diagnoses:  Left facial numbness        Garald Balding, NP 08/27/13 2023

## 2013-08-26 NOTE — Patient Instructions (Addendum)
We will do a EEG and try as needed gabapentin: Start Neurontin (gabapentin) 100 mg strength: Take 1 pill nightly as needed daily. The most common side effects reported are sedation or sleepiness. Rare side effects include balance problems, confusion, personality changes, diarrhea and nausea.   Please keep a log regarding your symptoms in relation to your herceptin infusion.

## 2013-08-28 ENCOUNTER — Ambulatory Visit (INDEPENDENT_AMBULATORY_CARE_PROVIDER_SITE_OTHER): Payer: BC Managed Care – PPO | Admitting: Radiology

## 2013-08-28 DIAGNOSIS — R200 Anesthesia of skin: Secondary | ICD-10-CM

## 2013-08-28 DIAGNOSIS — R209 Unspecified disturbances of skin sensation: Secondary | ICD-10-CM

## 2013-08-28 DIAGNOSIS — G501 Atypical facial pain: Secondary | ICD-10-CM

## 2013-08-28 DIAGNOSIS — R202 Paresthesia of skin: Secondary | ICD-10-CM

## 2013-08-28 NOTE — Procedures (Signed)
   HISTORY:  48 years old female, presenting with episode of left facial numbness tingling, left tongue numbness since April 2013  TECHNIQUE:  16 channel EEG was performed based on standard 10-16 international system. One channel was dedicated to EKG, which has demonstrates normal sinus rhythm of 78 beats per minutes.  Upon awakening, the posterior background activity was well-developed, in alpha range, 10 Hz, with amplitude of 30 microvoltage, reactive to eye opening and closure.  There was no evidence of epileptiform discharge.  Photic stimulation was performed, which induced a symmetric photic driving.  Hyperventilation was performed, there was no abnormality elicit.  No sleep was achieved.  CONCLUSION: This is a  normal awake EEG.  There is no electrodiagnostic evidence of epileptiform discharge

## 2013-08-28 NOTE — ED Provider Notes (Signed)
Medical screening examination/treatment/procedure(s) were performed by non-physician practitioner and as supervising physician I was immediately available for consultation/collaboration.   EKG Interpretation None        Alfonzo Feller, DO 08/28/13 1626

## 2013-08-30 NOTE — Progress Notes (Signed)

## 2013-09-01 NOTE — Progress Notes (Signed)
Patient was given her results and advised to call the office with any question, problems, concerns or updates.  Patient verbalized understanding.

## 2013-09-02 ENCOUNTER — Telehealth (INDEPENDENT_AMBULATORY_CARE_PROVIDER_SITE_OTHER): Payer: Self-pay

## 2013-09-02 NOTE — Telephone Encounter (Signed)
Called Elizabeth Miles back to let her know that Dr Donne Hazel advised for Elizabeth Miles to get her mgm 23mo. After completion of radiation. The Elizabeth Miles completed radiation at the end of March so she will get her mgm scheduled with [facility] for the end of September.

## 2013-09-02 NOTE — Telephone Encounter (Signed)
Message copied by Illene Regulus on Tue Sep 02, 2013  2:05 PM ------      Message from: Aviva Signs      Created: Thu Aug 28, 2013  2:54 PM       Pt called to say that its time for her mgm but the doc office wants ok`ed  by Dr Donne Hazel before scheduling.Marland KitchenMarland KitchenThayer Headings...please call pt at [phone] ------

## 2013-09-05 ENCOUNTER — Ambulatory Visit (HOSPITAL_BASED_OUTPATIENT_CLINIC_OR_DEPARTMENT_OTHER): Payer: BC Managed Care – PPO | Admitting: Hematology

## 2013-09-05 ENCOUNTER — Other Ambulatory Visit: Payer: BC Managed Care – PPO

## 2013-09-05 ENCOUNTER — Ambulatory Visit (HOSPITAL_BASED_OUTPATIENT_CLINIC_OR_DEPARTMENT_OTHER): Payer: BC Managed Care – PPO

## 2013-09-05 ENCOUNTER — Other Ambulatory Visit (HOSPITAL_BASED_OUTPATIENT_CLINIC_OR_DEPARTMENT_OTHER): Payer: BC Managed Care – PPO

## 2013-09-05 VITALS — BP 115/73 | HR 71 | Temp 97.6°F | Resp 18 | Ht 67.0 in | Wt 216.9 lb

## 2013-09-05 DIAGNOSIS — C50919 Malignant neoplasm of unspecified site of unspecified female breast: Secondary | ICD-10-CM

## 2013-09-05 DIAGNOSIS — Z5112 Encounter for antineoplastic immunotherapy: Secondary | ICD-10-CM

## 2013-09-05 DIAGNOSIS — C50419 Malignant neoplasm of upper-outer quadrant of unspecified female breast: Secondary | ICD-10-CM

## 2013-09-05 DIAGNOSIS — E669 Obesity, unspecified: Secondary | ICD-10-CM | POA: Insufficient documentation

## 2013-09-05 DIAGNOSIS — Z170 Estrogen receptor positive status [ER+]: Secondary | ICD-10-CM

## 2013-09-05 LAB — COMPREHENSIVE METABOLIC PANEL (CC13)
ALT: 25 U/L (ref 0–55)
ANION GAP: 9 meq/L (ref 3–11)
AST: 23 U/L (ref 5–34)
Albumin: 4 g/dL (ref 3.5–5.0)
Alkaline Phosphatase: 71 U/L (ref 40–150)
BILIRUBIN TOTAL: 0.57 mg/dL (ref 0.20–1.20)
BUN: 11.7 mg/dL (ref 7.0–26.0)
CALCIUM: 9.7 mg/dL (ref 8.4–10.4)
CHLORIDE: 107 meq/L (ref 98–109)
CO2: 25 mEq/L (ref 22–29)
CREATININE: 0.8 mg/dL (ref 0.6–1.1)
Glucose: 93 mg/dl (ref 70–140)
Potassium: 4.2 mEq/L (ref 3.5–5.1)
SODIUM: 142 meq/L (ref 136–145)
TOTAL PROTEIN: 7.5 g/dL (ref 6.4–8.3)

## 2013-09-05 LAB — CBC WITH DIFFERENTIAL/PLATELET
BASO%: 0.6 % (ref 0.0–2.0)
Basophils Absolute: 0 10*3/uL (ref 0.0–0.1)
EOS%: 11 % — ABNORMAL HIGH (ref 0.0–7.0)
Eosinophils Absolute: 0.4 10*3/uL (ref 0.0–0.5)
HCT: 38.2 % (ref 34.8–46.6)
HGB: 12.8 g/dL (ref 11.6–15.9)
LYMPH%: 28.7 % (ref 14.0–49.7)
MCH: 29.9 pg (ref 25.1–34.0)
MCHC: 33.5 g/dL (ref 31.5–36.0)
MCV: 89.3 fL (ref 79.5–101.0)
MONO#: 0.3 10*3/uL (ref 0.1–0.9)
MONO%: 10.4 % (ref 0.0–14.0)
NEUT#: 1.6 10*3/uL (ref 1.5–6.5)
NEUT%: 49.3 % (ref 38.4–76.8)
NRBC: 0 % (ref 0–0)
Platelets: 215 10*3/uL (ref 145–400)
RBC: 4.28 10*6/uL (ref 3.70–5.45)
RDW: 13 % (ref 11.2–14.5)
WBC: 3.3 10*3/uL — ABNORMAL LOW (ref 3.9–10.3)
lymph#: 0.9 10*3/uL (ref 0.9–3.3)

## 2013-09-05 MED ORDER — ACETAMINOPHEN 325 MG PO TABS
ORAL_TABLET | ORAL | Status: AC
  Filled 2013-09-05: qty 2

## 2013-09-05 MED ORDER — DIPHENHYDRAMINE HCL 25 MG PO CAPS
50.0000 mg | ORAL_CAPSULE | Freq: Once | ORAL | Status: AC
  Administered 2013-09-05: 50 mg via ORAL

## 2013-09-05 MED ORDER — SODIUM CHLORIDE 0.9 % IV SOLN
Freq: Once | INTRAVENOUS | Status: AC
  Administered 2013-09-05: 10:00:00 via INTRAVENOUS

## 2013-09-05 MED ORDER — DEXAMETHASONE SODIUM PHOSPHATE 10 MG/ML IJ SOLN
4.0000 mg | Freq: Once | INTRAMUSCULAR | Status: AC
  Administered 2013-09-05: 4 mg via INTRAVENOUS

## 2013-09-05 MED ORDER — ACETAMINOPHEN 325 MG PO TABS
650.0000 mg | ORAL_TABLET | Freq: Once | ORAL | Status: AC
  Administered 2013-09-05: 650 mg via ORAL

## 2013-09-05 MED ORDER — TRASTUZUMAB CHEMO INJECTION 440 MG
6.0000 mg/kg | Freq: Once | INTRAVENOUS | Status: AC
  Administered 2013-09-05: 567 mg via INTRAVENOUS
  Filled 2013-09-05: qty 27

## 2013-09-05 MED ORDER — DEXAMETHASONE SODIUM PHOSPHATE 10 MG/ML IJ SOLN
INTRAMUSCULAR | Status: AC
  Filled 2013-09-05: qty 1

## 2013-09-05 MED ORDER — DIPHENHYDRAMINE HCL 25 MG PO CAPS
ORAL_CAPSULE | ORAL | Status: AC
  Filled 2013-09-05: qty 2

## 2013-09-05 NOTE — Patient Instructions (Signed)
[facility] Discharge Instructions for Patients Receiving Chemotherapy  Today you received the following chemotherapy agents Herceptin.  To help prevent nausea and vomiting after your treatment, we encourage you to take your nausea medication as prescribed.   If you develop nausea and vomiting that is not controlled by your nausea medication, call the clinic.   BELOW ARE SYMPTOMS THAT SHOULD BE REPORTED IMMEDIATELY:  *FEVER GREATER THAN 100.5 F  *CHILLS WITH OR WITHOUT FEVER  NAUSEA AND VOMITING THAT IS NOT CONTROLLED WITH YOUR NAUSEA MEDICATION  *UNUSUAL SHORTNESS OF BREATH  *UNUSUAL BRUISING OR BLEEDING  TENDERNESS IN MOUTH AND THROAT WITH OR WITHOUT PRESENCE OF ULCERS  *URINARY PROBLEMS  *BOWEL PROBLEMS  UNUSUAL RASH Items with * indicate a potential emergency and should be followed up as soon as possible.  Feel free to call the clinic you have any questions or concerns. The clinic phone number is [phone].    

## 2013-09-05 NOTE — Progress Notes (Signed)
[place]  Telephone:(336) [phone] Fax:(336) [phone]     ID: Elizabeth Miles OB: [birth year]  MR#: [id number]  JYN#:[id number]  PCP: Clinton Quant, MD SU: Dr. Rolm Bookbinder OTHER MD:  Radiation oncology-Dr. 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  BREAST CANCER HISTORY:  #1 Normal mammogram in 07/2011. On physical exam however the possibility of a cyst was noted in the right breast. In 07/2012 patient noted on exam another lump in the right breast. She underwent a diagnostic mammogram on 08/06/2012 that showed a right breast nodule in the outer quadrant. She had an ultrasound performed that showed at the 9:30 o'clock position a 3.6 cm area, and a 10:00 position a 1.3 cm area with a total area measuring between 5-6 cm. Right breast needle core biopsy on 08/06/2012 performed in [place], [place] showed pathology that revealed an intermediate grade invasive ductal carcinoma. (This is been confirmed by our pathology as well). The tumor was estrogen receptor positive strongly (100%), progesterone receptor negative, HER-2/neu negative, with a Ki-67 that showed a high proliferation rate.   CURRENT THERAPY: Herceptin every three weeks, Tamoxifen $RemoveBeforeDE'20mg'KQwihpnsChJkvWm$  daily  INTERVAL HISTORY:  Today I saw the patient for the first time. I reviewed her records. She did express some concern that with her last Herceptin therapy, which was given over 30 minute infusion, she experienced some facial tingling on her left side and had some trouble with swallowing. She does get premedicated with Benadryl and Tylenol. She and her going to the emergency room and over that she had a for evaluation including a CT scan of the brain, MRI of the brain and EEG study she was seen by a neurologist. All  workup was negative I reviewed the scans myself. Today in anticipation of her therapy with Herceptin I ordered the pharmacy to increase the infusion duration to 90 minutes and added 4 mg of Decadron prior to the infusion. patient is planning to go to [facility] later today. Patient is taking her tamoxifen on a regular basis.  REVIEW OF SYSTEMS: A 10 point review of systems was conducted and is otherwise negative except for what is noted above.    ROS   +reaction to herceptin, neurologist recommended neurontin but she is not taking it, she takes zyrtec prn. No sob, cp, n/v/d, no ankle swelling, no headache today.    Physical Exam see below    PAST MEDICAL HISTORY: Past Medical History  Diagnosis Date  . 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 GERD (gastroesophageal reflux disease)   . GERD (gastroesophageal reflux disease) 08/22/2012  . Allergy   . Complication of anesthesia     1986 ; problem waking up  . Anxiety   . Wears glasses     PAST SURGICAL HISTORY: Past Surgical History  Procedure Laterality Date  . Dilation and curettage of uterus  1993  . Cervical ablation  1983  . Breast biopsy Right 08/06/12  . Popliteal synovial cyst excision  1970  . Portacath placement Left 09/12/2012    Procedure: INSERTION PORT-A-CATH;  Surgeon: Rolm Bookbinder, MD;  Location: [place];  Service: General;  Laterality: Left;  . Breast lumpectomy with needle localization and axillary sentinel lymph node bx Right 03/10/2013    Procedure: BREAST LUMPECTOMY WITH NEEDLE  LOCALIZATION AND AXILLARY SENTINEL LYMPH NODE BIOPSY;  Surgeon: Rolm Bookbinder, MD;  Location: [place];  Service: General;  Laterality: Right;  . Port-a-cath removal Left 03/10/2013    Procedure: REMOVAL PORT-A-CATH;  Surgeon: Rolm Bookbinder, MD;  Location: [place];  Service: General;  Laterality: Left;    FAMILY HISTORY Family History  Problem Relation Age of Onset  .  Hypertension Mother   . Bladder Cancer Maternal Uncle 68  . Cancer Paternal Grandmother 50    lung cancer  . Lung cancer Paternal Grandfather     dx in his 15s  . COPD Paternal Uncle        HEALTH MAINTENANCE: History  Substance Use Topics  . Smoking status: Never Smoker   . Smokeless tobacco: Never Used  . Alcohol Use: No       Allergies  Allergen Reactions  . Darvocet [Propoxyphene N-Acetaminophen] Shortness Of Breath and Swelling  . Shellfish Allergy Shortness Of Breath and Swelling  . Other     CT dye, "chest hurt" "makes me feel cold"  ?Reaction to Herceptin. I am premedicating with Dex and giving a slow infusion x 90 mins.  Current Outpatient Prescriptions  Medication Sig Dispense Refill  . acetaminophen (TYLENOL) 500 MG tablet Take 1,000 mg by mouth every 6 (six) hours as needed.      Marland Kitchen Dexlansoprazole (DEXILANT) 30 MG capsule Take 1 capsule (30 mg total) by mouth daily.  90 capsule  4  . gabapentin (NEURONTIN) 100 MG capsule Take as needed for facial tingling, once daily  30 capsule  3  . ibuprofen (ADVIL,MOTRIN) 800 MG tablet Take 800 mg by mouth every 8 (eight) hours as needed for moderate pain (for back pain).      . tamoxifen (NOLVADEX) 20 MG tablet Take 1 tablet (20 mg total) by mouth daily.  90 tablet  3   No current facility-administered medications for this visit.   Facility-Administered Medications Ordered in Other Visits  Medication Dose Route Frequency Provider Last Rate Last Dose  . trastuzumab (HERCEPTIN) 567 mg in sodium chloride 0.9 % 250 mL chemo infusion  6 mg/kg (Treatment Plan Actual) Intravenous Once Deatra Robinson, MD 184.7 mL/hr at 09/05/13 1039 567 mg at 09/05/13 1039    OBJECTIVE: Filed Vitals:   09/05/13 0848  BP: 115/73  Pulse: 71  Temp: 97.6 F (36.4 C)  Resp: 18     Body mass index is 33.96 kg/(m^2).     GENERAL: Patient is a well appearing female in no acute distress HEENT:  Sclerae anicteric.  Oropharynx clear and moist. No  ulcerations or evidence of oropharyngeal candidiasis. Neck is supple.  NODES:  No cervical, supraclavicular, or axillary lymphadenopathy palpated.  BREAST EXAM:  Deferred. LUNGS:  Clear to auscultation bilaterally.  No wheezes or rhonchi. HEART:  Regular rate and rhythm. No murmur appreciated. ABDOMEN:  Soft, nontender.  Positive, normoactive bowel sounds. No organomegaly palpated. MSK:  No focal spinal tenderness to palpation. Full range of motion bilaterally in the upper extremities. EXTREMITIES:  No peripheral edema.   SKIN:  Clear with no obvious rashes or skin changes. No nail dyscrasia. NEURO:  Nonfocal. Well oriented.  Appropriate affect. ECOG FS:1 - Symptomatic but completely ambulatory  LAB RESULTS:  CMP     Component Value Date/Time   NA 142 09/05/2013 0825   NA 144 08/17/2013 0346   K 4.2 09/05/2013 0825   K 3.8 08/17/2013 0346   CL 105 08/17/2013 0346  CO2 25 09/05/2013 0825   GLUCOSE 93 09/05/2013 0825   GLUCOSE 155* 08/17/2013 0346   BUN 11.7 09/05/2013 0825   BUN 8 08/17/2013 0346   CREATININE 0.8 09/05/2013 0825   CREATININE 0.80 08/17/2013 0346   CALCIUM 9.7 09/05/2013 0825   PROT 7.5 09/05/2013 0825   ALBUMIN 4.0 09/05/2013 0825   AST 23 09/05/2013 0825   ALT 25 09/05/2013 0825   ALKPHOS 71 09/05/2013 0825   BILITOT 0.57 09/05/2013 0825     Lab Results  Component Value Date   WBC 3.3* 09/05/2013   NEUTROABS 1.6 09/05/2013   HGB 12.8 09/05/2013   HCT 38.2 09/05/2013   MCV 89.3 09/05/2013   PLT 215 09/05/2013           STUDIES:  I reviewed the CT scan brain, MRI Brain and showed results to patient.  ASSESSMENT: 48 y.o. 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     #1 Normal mammogram in 07/2011. On physical exam however the possibility of a cyst was noted in the right breast. In 07/2012 patient noted on exam another lump in the right breast. She underwent a diagnostic  mammogram on 08/06/2012 that showed a right breast nodule in the outer quadrant. She had an ultrasound performed that showed at the 9:30 o'clock position a 3.6 cm area, and a 10:00 position a 1.3 cm area with a total area measuring between 5-6 cm. Right breast needle core biopsy on 08/06/2012 performed in [place], [place] showed pathology that revealed an intermediate grade invasive ductal carcinoma. (This is been confirmed by our pathology as well). The tumor was estrogen receptor positive strongly (100%), progesterone receptor negative, HER-2/neu negative, with a Ki-67 that showed a high proliferation rate.   #2 Bilateral breast MRI on 08/27/2012 revealed in the right upper quadrant irregular lobulated mass with a satellite nodule or lobulation within 2 mm at its superior aspect, measured together as 4.5 x 4.0 x 3.8 cm. There was extension of enhancement to the nipple, suggesting nipple involvement may be present. No lymphadenopathy was noted there was no any other area of abnormal enhancement in either breast (clinical stage IIA, T2 N0).   #3 PET scan performed on 08/29/2012 revealed the primary breast cancer measuring 3.2 x 3.3 cm with SUV of 16. It was adjacent nodule along the superiomedial border of the primary mass measuring 1.4 cm which was also hypermetabolic. There were no additional areas of abnormal hypermetabolism. No abnormal hypermetabolic activity was seen in the chest, abdomen/pelvis,within the liver, pancreas, adrenal glands or spleen. No hypermetabolic lymph nodes. In the skeleton, no focal hypermetabolic activity to suggest skeletal metastasis was seen.   #4 Patient also seen by Dr. Rolm Bookbinder and Dr. Thea Silversmith in consultation. Dr. Donne Hazel placed a  Port-A-Cath on 09/12/2012. Patient attended a chemotherapy teaching class.   #5 Completed neoadjuvant chemotherapy consisting of Q14 day Adriamycin/Cytoxan x 4 cycles on 10/25/2012. Cytoxan infusion have to be slowed down as  well.  #6 Received one dose of neoadjuvant chemotherapy with Taxol on 11/08/2012. She developed significant grade 2 neuropathy and Taxol was discontinued.   #7 s/p neoadjuvant chemotherapy consisting of single agent Abraxane given on day 1, 8, 15 of each 28 day cycle. She completed therapy on 11/15/12 - 02/14/13.   #8 status post right breast lumpectomy with sentinel lymph node biopsy on 03/10/13. The final pathology revealed:  Diagnosis  1. Breast,  lumpectomy, Right  - INVASIVE GRADE II DUCTAL CARCINOMA WITH MICROCALCIFICATIONS AND FOCAL  ASSOCIATED MUCIN, SPANNING 2.1 CM IN GREATEST DIMENSION.  - LYMPH/VASCULAR INVASION IS IDENTIFIED.  - MARGINS ARE NEGATIVE.  - SEE ONCOLOGY TEMPLATE.  1 of 4  FINAL for Elizabeth, Miles ([id number])  Diagnosis(continued)  2. Breast, excision, Right retro-areolar tissue, anterior margin  - BENIGN BREAST PARENCHYMA WITH FIBROCYSTIC CHANGES.  - NO ATYPIA, HYPERPLASIA, OR MALIGNANCY IDENTIFIED.  3. Breast, excision, Right additional lateral margin  - BENIGN BREAST PARENCHYMA WITH FIBROCYSTIC CHANGES.  - NO ATYPIA, HYPERPLASIA, OR MALIGNANCY IDENTIFIED.  4. Lymph node, sentinel, biopsy, Right axillary #1  - ONE BENIGN LYMPH NODE WITH NO TUMOR SEEN (0/1).   #9. Repeat Her2Neu testing on final pathology positive: patient recommended anti-her2 therapy with single agent trastuzumab that began in March 2015 to continue through March 2016  PLAN:  Ms. Cinquemani will proceed with Herceptin today.  Her CBC is stable.  I reviewed it with her in detail.  I added the premedication Decadron and instructed the pharmacy to run the Herceptin over a 90 minute infusion.  Her last echocardiogram was on 07/24/13 and demonstrated a LVEF of 60%, she was evaluated by Dr. Aundra Dubin and he cleared her to continue Herceptin therapy.     She will return every three weeks for labs and Herceptin.   She knows to call us in the interim for any questions or concerns.  We can certainly see her  sooner if needed.  Mild leukopenia: we will observe her White count.  I spent 25 minutes counseling the patient face to face.  The total time spent in the appointment was 30 minutes.  Bernadene Bell, [place] [phone]  09/05/2013 11:50 AM

## 2013-09-14 NOTE — Progress Notes (Signed)
[place]  Telephone:(336) [phone] Fax:(336) [phone]  OFFICE PROGRESS NOTE  ID: Elizabeth Miles   DOB: 1966/02/26  MR#: [id number]  DPO#:[id number]   PCP: Clinton Quant, MD [place] PCP:  Margaretha Sheffield, MD   SU:  Rolm Bookbinder, MD RAD ONC:   Thea Silversmith, MD  DIAGNOSIS:  Elizabeth Miles is a 48 y.o.  female diagnosed with stage II right breast invasive ductal carcinoma in 07/2012 in [place], [place].  STAGE:  Breast cancer   Primary site: Breast   Staging method: AJCC 7th Edition   Clinical: Stage IIA (T2, N0, cM0) signed by Thea Silversmith, MD on 08/29/2012  8:17 PM   Pathologic: Stage IIA (T2, N0, cM0) signed by Deatra Robinson, MD on 04/13/2013  8:39 PM   Summary: Stage IIA (T2, N0, cM0) Her2Neu+ on final pathology  PRIOR THERAPY: #1 Normal mammogram in 07/2011. On physical exam however the possibility of a cyst was noted in the right breast. In 07/2012 patient noted on exam another lump in the right breast. She underwent a diagnostic mammogram on 08/06/2012 that showed a right breast nodule in the outer quadrant. She had an ultrasound performed that showed at the 9:30 o'clock position a 3.6 cm area, and a 10:00 position a 1.3 cm area with a total area measuring between 5-6 cm.  Right breast needle core biopsy on 08/06/2012  performed in [place], [place] showed pathology  that revealed an intermediate grade invasive ductal carcinoma. (This is been confirmed by our pathology as well).  The tumor was estrogen receptor positive strongly (100%), progesterone receptor negative,  HER-2/neu negative, with a Ki-67 that showed a high proliferation rate.   #2 Bilateral breast MRI on 08/27/2012 revealed in the right upper quadrant irregular lobulated mass with a satellite nodule or lobulation within 2 mm at its superior aspect, measured together as 4.5 x 4.0 x 3.8 cm.  There was extension of enhancement to the nipple, suggesting nipple involvement may be present.  No  lymphadenopathy was noted there was no any other area of abnormal enhancement in either breast (clinical stage IIA, T2 N0).  #3 PET scan performed on 08/29/2012 revealed the primary breast cancer measuring 3.2 x 3.3 cm with SUV of 16. It was adjacent nodule along the superiomedial border of the primary mass measuring 1.4 cm which was also hypermetabolic. There were no additional areas of abnormal hypermetabolism.  No abnormal hypermetabolic activity was seen in the chest, abdomen/pelvis,within the liver, pancreas, adrenal glands or spleen. No hypermetabolic lymph nodes. In the skeleton, no focal hypermetabolic activity to suggest skeletal metastasis was seen.  #4 Patient also seen by Dr. Rolm Bookbinder and Dr. Thea Silversmith in consultation. Dr. Donne Hazel  place the Port-A-Cath on 09/12/2012.  Patient attended a chemotherapy teaching class.   #5 Completed neoadjuvant chemotherapy consisting of Q14 day Adriamycin/Cytoxan x 4 cycles on 10/25/2012.  #6 Received one dose of neoadjuvant chemotherapy with Taxol on 11/08/2012.  She developed significant grade 2 neuropathy and Taxol was discontinued.  #7 s/p  neoadjuvant chemotherapy consisting of single agent Abraxane given on day 1, 8, 15 of each 28 day cycle.  She completed therapy on 11/15/12 - 02/14/13.    #8  status post right breast lumpectomy with sentinel lymph node biopsy on 03/10/13. The final pathology revealed:  Diagnosis 1. Breast, lumpectomy, Right - INVASIVE GRADE II DUCTAL CARCINOMA WITH MICROCALCIFICATIONS AND FOCAL ASSOCIATED MUCIN, SPANNING 2.1 CM IN GREATEST DIMENSION. - LYMPH/VASCULAR INVASION IS IDENTIFIED. - MARGINS ARE NEGATIVE. -  SEE ONCOLOGY TEMPLATE. 1 of 4 FINAL for Elizabeth Miles, Elizabeth Miles ([id number]) Diagnosis(continued) 2. Breast, excision, Right retro-areolar tissue, anterior margin - BENIGN BREAST PARENCHYMA WITH FIBROCYSTIC CHANGES. - NO ATYPIA, HYPERPLASIA, OR MALIGNANCY IDENTIFIED. 3. Breast, excision, Right additional  lateral margin - BENIGN BREAST PARENCHYMA WITH FIBROCYSTIC CHANGES. - NO ATYPIA, HYPERPLASIA, OR MALIGNANCY IDENTIFIED. 4. Lymph node, sentinel, biopsy, Right axillary #1 - ONE BENIGN LYMPH NODE WITH NO TUMOR SEEN (0/1).  #9. Repeat Her2Neu testing on final pathology positive: patient recommended anti-her2 therapy with single trastuzemab beginning March 2015   CURRENT THERAPY: RT/Herceptin cycle 1  INTERVAL HISTORY: Elizabeth Miles is a  48 y.o. female who returns for follow up.   She is here for her next cycle of herceptin. She is doing well. Today she denies any headaches double vision blurring of vision fevers chills night sweats. No shortness of breath chest pains palpitations. No abdominal pain no diarrhea or constipation. She has no easy bruising or bleeding. She has no myalgias and arthralgias. No peripheral paresthesias or gait disturbances. Remainder of the 10 point review of systems is negative.     MEDICAL HISTORY: Past Medical History  Diagnosis Date  . 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 GERD (gastroesophageal reflux disease)   . GERD (gastroesophageal reflux disease) 08/22/2012  . Allergy   . Complication of anesthesia     1986 ; problem waking up  . Anxiety   . Wears glasses     ALLERGIES:   Allergies  Allergen Reactions  . Darvocet [Propoxyphene N-Acetaminophen] Shortness Of Breath and Swelling  . Shellfish Allergy Shortness Of Breath and Swelling  . Other     CT dye, "chest hurt" "makes me feel cold"     MEDICATIONS:  Current Outpatient Prescriptions  Medication Sig Dispense Refill  . acetaminophen (TYLENOL) 500 MG tablet Take 1,000 mg by mouth every 6 (six) hours as needed.      Marland Kitchen Dexlansoprazole (DEXILANT) 30 MG capsule Take 1 capsule (30 mg total) by mouth daily.  90 capsule  4  . ibuprofen (ADVIL,MOTRIN) 800 MG tablet Take 800 mg by mouth every 8 (eight) hours as needed for moderate pain (for back pain).      . tamoxifen (NOLVADEX) 20  MG tablet Take 1 tablet (20 mg total) by mouth daily.  90 tablet  3  . gabapentin (NEURONTIN) 100 MG capsule Take as needed for facial tingling, once daily  30 capsule  3   No current facility-administered medications for this visit.    SURGICAL HISTORY:  Past Surgical History  Procedure Laterality Date  . Dilation and curettage of uterus  1993  . Cervical ablation  1983  . Breast biopsy Right 08/06/12  . Popliteal synovial cyst excision  1970  . Portacath placement Left 09/12/2012    Procedure: INSERTION PORT-A-CATH;  Surgeon: Rolm Bookbinder, MD;  Location: [place];  Service: General;  Laterality: Left;  . Breast lumpectomy with needle localization and axillary sentinel lymph node bx Right 03/10/2013    Procedure: BREAST LUMPECTOMY WITH NEEDLE LOCALIZATION AND AXILLARY SENTINEL LYMPH NODE BIOPSY;  Surgeon: Rolm Bookbinder, MD;  Location: [place];  Service: General;  Laterality: Right;  . Port-a-cath removal Left 03/10/2013    Procedure: REMOVAL PORT-A-CATH;  Surgeon: Rolm Bookbinder, MD;  Location: [place];  Service: General;  Laterality: Left;    REVIEW OF SYSTEMS:  A 10 point review of systems was completed and is negative except as noted  above.  PHYSICAL EXAMINATION: Blood pressure 120/84, pulse 84, temperature 97.7 F (36.5 C), temperature source Oral, resp. rate 18, height $RemoveBe'5\' 7"'khLiyQgmh$  (1.702 m), weight 212 lb 3.2 oz (96.253 kg), last menstrual period 03/12/2012. Body mass index is 33.23 kg/(m^2). General: Patient is a well appearing female in no acute distress HEENT: PERRLA, sclerae anicteric no conjunctival pallor, MMM Neck: supple, no palpable adenopathy Lungs: clear to auscultation bilaterally, no wheezes, rhonchi, or rales Cardiovascular: regular rate rhythm, S1, S2, no murmurs, rubs or gallops Abdomen: Soft, non-tender, non-distended, normoactive bowel sounds, no HSM Extremities: warm and well perfused, no clubbing, cyanosis, or edema Skin:  No rashes or lesions, mole on right side of neck with minimal erythema Neuro: Non-focal. ECOG PERFORMANCE STATUS: 1 - Symptomatic but completely ambulatory   Pathology 03/10/13: Diagnosis 1. Breast, lumpectomy, Right - INVASIVE GRADE II DUCTAL CARCINOMA WITH MICROCALCIFICATIONS AND FOCAL ASSOCIATED MUCIN, SPANNING 2.1 CM IN GREATEST DIMENSION. - LYMPH/VASCULAR INVASION IS IDENTIFIED. - MARGINS ARE NEGATIVE. - SEE ONCOLOGY TEMPLATE. 1 of 4 FINAL for Elizabeth Miles, Elizabeth Miles ([id number]) Diagnosis(continued) 2. Breast, excision, Right retro-areolar tissue, anterior margin - BENIGN BREAST PARENCHYMA WITH FIBROCYSTIC CHANGES. - NO ATYPIA, HYPERPLASIA, OR MALIGNANCY IDENTIFIED. 3. Breast, excision, Right additional lateral margin - BENIGN BREAST PARENCHYMA WITH FIBROCYSTIC CHANGES. - NO ATYPIA, HYPERPLASIA, OR MALIGNANCY IDENTIFIED. 4. Lymph node, sentinel, biopsy, Right axillary #1 - ONE BENIGN LYMPH NODE WITH NO TUMOR SEEN (0/1). Microscopic Comment  RADIOGRAPHIC STUDIES: No results found.   ASSESSMENT/PLAN: Elizabeth Miles is a 48 y.o. female diagnosed by biopsy with stage II, invasive ductal carcinoma of the right breast in 07/2012:  #1 S/P neoadjuvant chemotherapy consisting of Q14 day Adriamycin/Cytoxan x 4 cycles on 10/25/2012. She then received one dose of neoadjuvant Taxol on 11/08/2012 unfortunately could not tolerate due to grade 2 neuropathy. The Taxol was discontinued and she was started on single agent Abraxane day 1 day 8 day 15 on a 28 day cycle for a total of 4 cycles. She received this from 11/15/2012 through 02/14/2013.  #2  status post right lumpectomy with sentinel lymph node biopsy performed on 03/10/2013. Final pathology revealed a 2.1 cm grade 2 invasive ductal carcinoma with microcalcifications and associated mucin. Lymphovascular invasion was identified. All margins were negative. One sentinel node was negative for metastatic disease.  #3 right hip pain: resolved  #4.  Back pain/chest: obtain x rays  #5. Her2Neu positive breast cancer on final path: for herceptin today.     All questions answered.  Ms. Buerger and her family/friend were encouraged to contact us in the interim with any questions, concerns, or problems.  Marcy Panning, MD Medical/Oncology [facility] [phone] (beeper) [phone] (Office)

## 2013-09-15 ENCOUNTER — Encounter (HOSPITAL_COMMUNITY): Payer: Self-pay

## 2013-09-15 ENCOUNTER — Ambulatory Visit (HOSPITAL_COMMUNITY)
Admission: RE | Admit: 2013-09-15 | Discharge: 2013-09-15 | Disposition: A | Discharge status: Home / Self Care | Payer: BC Managed Care – PPO | Source: Ambulatory Visit | Attending: Internal Medicine | Admitting: Internal Medicine

## 2013-09-15 ENCOUNTER — Ambulatory Visit (HOSPITAL_BASED_OUTPATIENT_CLINIC_OR_DEPARTMENT_OTHER)
Admission: RE | Admit: 2013-09-15 | Discharge: 2013-09-15 | Disposition: A | Discharge status: Home / Self Care | Payer: BC Managed Care – PPO | Source: Ambulatory Visit | Attending: Cardiology | Admitting: Cardiology

## 2013-09-15 ENCOUNTER — Other Ambulatory Visit (HOSPITAL_COMMUNITY): Payer: Self-pay | Admitting: Unknown Physician Specialty

## 2013-09-15 VITALS — BP 110/88 | HR 73 | Wt 217.1 lb

## 2013-09-15 DIAGNOSIS — C50919 Malignant neoplasm of unspecified site of unspecified female breast: Secondary | ICD-10-CM

## 2013-09-15 DIAGNOSIS — C801 Malignant (primary) neoplasm, unspecified: Secondary | ICD-10-CM

## 2013-09-15 NOTE — Progress Notes (Signed)
[facility]  2D Echocardiogram completed.  Time, RDCS 09/15/2013 8:34 AM

## 2013-09-15 NOTE — Patient Instructions (Signed)
We will contact you in 3 months to schedule your next appointment and echocardiogram  

## 2013-09-15 NOTE — Progress Notes (Signed)
Patient ID: Elizabeth Miles, female   DOB: [birth year], 48 y.o.   MRN: [id number] Referring Physician: Magrinat Primary Care: Pomposini  HPI:  Elizabeth Miles is a 48 y/o woman with h/o GERD and R breast CA referred by Dr. Humphrey Rolls for enrollment into the cardio-oncology Clinic.  Diagnosed in 6/14 with R breast CA  (clinical stage IIA, T2 N0). Triple positive.  Completed neoadjuvant chemotherapy consisting of Q14 day Adriamycin/Cytoxan x 4 cycles on 10/25/2012. Received one dose of neoadjuvant chemotherapy with Taxol on 11/08/2012. She developed significant grade 2 neuropathy and Taxol was discontinued. s/p neoadjuvant chemotherapy consisting of single agent Abraxane given on day 1, 8, 15 of each 28 day cycle. She completed therapy on 11/15/12 - 02/14/13. Status post right breast lumpectomy with sentinel lymph node biopsy on 03/10/13 (sentinel node negative). Now on XRT.   Denies h/o known cardiac disease. Had echo and stress test with Dr. Harmon Pier which were normal. She had an ECG and was told she had an abnormal T wave.   Echo 04/24/23  EF 60%. Lateral s' 8.9 cm GLS - 18.7 Echo 5/15 EF 29%, normal diastolic function, lateral s' 9.9, GLS -20.7% Echo 7/15 EF 60-65%, lateral s' 10.6, GLS -21.7%.   Denies CP, SOB, edema. She has had some numbness/tingling on the left side of her face.  Review of Systems: All systems reviewed and negative except as per HPI.  Past Medical History  Diagnosis Date  . 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 GERD (gastroesophageal reflux disease)   . GERD (gastroesophageal reflux disease) 08/22/2012  . Allergy   . Complication of anesthesia     1986 ; problem waking up  . Anxiety   . Wears glasses     Current Outpatient Prescriptions  Medication Sig Dispense Refill  . acetaminophen (TYLENOL) 500 MG tablet Take 1,000 mg by mouth every 6 (six) hours as needed.      Marland Kitchen Dexlansoprazole (DEXILANT) 30 MG capsule Take 1 capsule (30 mg total) by mouth daily.  90 capsule   4  . gabapentin (NEURONTIN) 100 MG capsule Take as needed for facial tingling, once daily  30 capsule  3  . ibuprofen (ADVIL,MOTRIN) 800 MG tablet Take 800 mg by mouth every 8 (eight) hours as needed for moderate pain (for back pain).      . tamoxifen (NOLVADEX) 20 MG tablet Take 1 tablet (20 mg total) by mouth daily.  90 tablet  3   No current facility-administered medications for this encounter.    Allergies  Allergen Reactions  . Darvocet [Propoxyphene N-Acetaminophen] Shortness Of Breath and Swelling  . Shellfish Allergy Shortness Of Breath and Swelling  . Other     CT dye, "chest hurt" "makes me feel cold"    History   Social History  . Marital Status: Married    Spouse Name: N/A    Number of Children: 3  . Years of Education: N/A   Occupational History  . Not on file.   Social History Main Topics  . Smoking status: Never Smoker   . Smokeless tobacco: Never Used  . Alcohol Use: No  . Drug Use: No  . Sexual Activity: Yes     Comment: peri menopausal   Other Topics Concern  . Not on file   Social History Narrative  . No narrative on file    Family History  Problem Relation Age of Onset  . Hypertension Mother   . Bladder Cancer Maternal Uncle 68  .  Cancer Paternal Grandmother 27    lung cancer  . Lung cancer Paternal Grandfather     dx in his 5s  . COPD Paternal Uncle     PHYSICAL EXAM: Filed Vitals:   09/15/13 0852  BP: 110/88  Pulse: 73  Weight: 217 lb 1.9 oz (98.485 kg)  SpO2: 98%   General:  Well appearing. No respiratory difficulty HEENT: normal Neck: supple. no JVD. Carotids 2+ bilat; no bruits. No lymphadenopathy or thryomegaly appreciated. Cor: PMI nondisplaced. Regular rate & rhythm. No rubs, gallops or murmurs. Lungs: clear Abdomen: soft, nontender, nondistended. No hepatosplenomegaly. No bruits or masses. Good bowel sounds. Extremities: no cyanosis, clubbing, rash.  Trace ankle edema. Neuro: alert & oriented x 3, cranial nerves grossly  intact. moves all 4 extremities w/o difficulty. Affect pleasant.  ASSESSMENT & PLAN: 1. Breast CA: I reviewed her echo today.  The echo was a normal study, all indices were stable.  She can continue Herceptin as planned.  Will get repeat echo and office visit in 3 months.  2. Abnormal ECG: Nonspecific abnormalities on ECG at prior appointment. Echo ok. No further w/u needed.  Dalton McLean,MD 09/15/2013

## 2013-09-25 ENCOUNTER — Other Ambulatory Visit: Payer: Self-pay | Admitting: *Deleted

## 2013-09-25 DIAGNOSIS — C50919 Malignant neoplasm of unspecified site of unspecified female breast: Secondary | ICD-10-CM

## 2013-09-26 ENCOUNTER — Ambulatory Visit (HOSPITAL_BASED_OUTPATIENT_CLINIC_OR_DEPARTMENT_OTHER): Payer: BC Managed Care – PPO

## 2013-09-26 ENCOUNTER — Telehealth: Payer: Self-pay | Admitting: *Deleted

## 2013-09-26 ENCOUNTER — Telehealth: Payer: Self-pay | Admitting: Oncology

## 2013-09-26 ENCOUNTER — Ambulatory Visit (HOSPITAL_BASED_OUTPATIENT_CLINIC_OR_DEPARTMENT_OTHER): Payer: BC Managed Care – PPO | Admitting: Oncology

## 2013-09-26 ENCOUNTER — Other Ambulatory Visit (HOSPITAL_BASED_OUTPATIENT_CLINIC_OR_DEPARTMENT_OTHER): Payer: BC Managed Care – PPO

## 2013-09-26 VITALS — BP 133/78 | HR 91 | Temp 98.1°F | Resp 18 | Ht 67.0 in | Wt 216.8 lb

## 2013-09-26 DIAGNOSIS — C50419 Malignant neoplasm of upper-outer quadrant of unspecified female breast: Secondary | ICD-10-CM

## 2013-09-26 DIAGNOSIS — C50919 Malignant neoplasm of unspecified site of unspecified female breast: Secondary | ICD-10-CM

## 2013-09-26 DIAGNOSIS — C50411 Malignant neoplasm of upper-outer quadrant of right female breast: Secondary | ICD-10-CM

## 2013-09-26 DIAGNOSIS — Z170 Estrogen receptor positive status [ER+]: Secondary | ICD-10-CM

## 2013-09-26 DIAGNOSIS — Z5112 Encounter for antineoplastic immunotherapy: Secondary | ICD-10-CM

## 2013-09-26 DIAGNOSIS — R209 Unspecified disturbances of skin sensation: Secondary | ICD-10-CM

## 2013-09-26 LAB — CBC WITH DIFFERENTIAL/PLATELET
BASO%: 1.2 % (ref 0.0–2.0)
Basophils Absolute: 0 10*3/uL (ref 0.0–0.1)
EOS%: 8.7 % — ABNORMAL HIGH (ref 0.0–7.0)
Eosinophils Absolute: 0.3 10*3/uL (ref 0.0–0.5)
HCT: 37.8 % (ref 34.8–46.6)
HGB: 12.7 g/dL (ref 11.6–15.9)
LYMPH#: 0.8 10*3/uL — AB (ref 0.9–3.3)
LYMPH%: 25.3 % (ref 14.0–49.7)
MCH: 30.2 pg (ref 25.1–34.0)
MCHC: 33.6 g/dL (ref 31.5–36.0)
MCV: 89.8 fL (ref 79.5–101.0)
MONO#: 0.3 10*3/uL (ref 0.1–0.9)
MONO%: 8.4 % (ref 0.0–14.0)
NEUT%: 56.4 % (ref 38.4–76.8)
NEUTROS ABS: 1.9 10*3/uL (ref 1.5–6.5)
NRBC: 0 % (ref 0–0)
PLATELETS: 231 10*3/uL (ref 145–400)
RBC: 4.21 10*6/uL (ref 3.70–5.45)
RDW: 12.8 % (ref 11.2–14.5)
WBC: 3.3 10*3/uL — AB (ref 3.9–10.3)

## 2013-09-26 LAB — COMPREHENSIVE METABOLIC PANEL (CC13)
ALBUMIN: 3.7 g/dL (ref 3.5–5.0)
ALT: 30 U/L (ref 0–55)
ANION GAP: 11 meq/L (ref 3–11)
AST: 26 U/L (ref 5–34)
Alkaline Phosphatase: 69 U/L (ref 40–150)
BUN: 9 mg/dL (ref 7.0–26.0)
CALCIUM: 9.3 mg/dL (ref 8.4–10.4)
CHLORIDE: 109 meq/L (ref 98–109)
CO2: 22 meq/L (ref 22–29)
Creatinine: 0.8 mg/dL (ref 0.6–1.1)
Glucose: 94 mg/dl (ref 70–140)
POTASSIUM: 3.9 meq/L (ref 3.5–5.1)
SODIUM: 141 meq/L (ref 136–145)
TOTAL PROTEIN: 7 g/dL (ref 6.4–8.3)
Total Bilirubin: 0.54 mg/dL (ref 0.20–1.20)

## 2013-09-26 MED ORDER — DIPHENHYDRAMINE HCL 25 MG PO CAPS
25.0000 mg | ORAL_CAPSULE | Freq: Once | ORAL | Status: AC
  Administered 2013-09-26: 25 mg via ORAL

## 2013-09-26 MED ORDER — DIPHENHYDRAMINE HCL 25 MG PO CAPS
ORAL_CAPSULE | ORAL | Status: AC
  Filled 2013-09-26: qty 1

## 2013-09-26 MED ORDER — DEXAMETHASONE SODIUM PHOSPHATE 10 MG/ML IJ SOLN
4.0000 mg | Freq: Once | INTRAMUSCULAR | Status: AC
  Administered 2013-09-26: 4 mg via INTRAVENOUS

## 2013-09-26 MED ORDER — DEXAMETHASONE SODIUM PHOSPHATE 10 MG/ML IJ SOLN
INTRAMUSCULAR | Status: AC
  Filled 2013-09-26: qty 1

## 2013-09-26 MED ORDER — SODIUM CHLORIDE 0.9 % IV SOLN: Freq: Once | INTRAVENOUS | Status: DC

## 2013-09-26 MED ORDER — TRASTUZUMAB CHEMO INJECTION 440 MG
6.0000 mg/kg | Freq: Once | INTRAVENOUS | Status: AC
  Administered 2013-09-26: 567 mg via INTRAVENOUS
  Filled 2013-09-26: qty 27

## 2013-09-26 NOTE — Telephone Encounter (Signed)
Per [facility] staff phone call scheduled appts. Advised schedulers 

## 2013-09-26 NOTE — Patient Instructions (Signed)
[facility] Discharge Instructions for Patients Receiving Chemotherapy  Today you received the following chemotherapy agents Herceptin.  To help prevent nausea and vomiting after your treatment, we encourage you to take your nausea medication as directed.   If you develop nausea and vomiting that is not controlled by your nausea medication, call the clinic.   BELOW ARE SYMPTOMS THAT SHOULD BE REPORTED IMMEDIATELY:  *FEVER GREATER THAN 100.5 F  *CHILLS WITH OR WITHOUT FEVER  NAUSEA AND VOMITING THAT IS NOT CONTROLLED WITH YOUR NAUSEA MEDICATION  *UNUSUAL SHORTNESS OF BREATH  *UNUSUAL BRUISING OR BLEEDING  TENDERNESS IN MOUTH AND THROAT WITH OR WITHOUT PRESENCE OF ULCERS  *URINARY PROBLEMS  *BOWEL PROBLEMS  UNUSUAL RASH Items with * indicate a potential emergency and should be followed up as soon as possible.  Feel free to call the clinic you have any questions or concerns. The clinic phone number is [phone].  

## 2013-09-26 NOTE — Progress Notes (Signed)
[facility]  [phone] [phone]     ID: Elizabeth Miles DOB: 04/28/1965  MR#: [id number]  CSN#:[id number]  Patient Care Team: Daniel L Pomposini, MD as PCP - General (Internal Medicine) Daniel L Pomposini, MD as Referring Physician (Internal Medicine) Matthew Wakefield, MD as Consulting Physician (General Surgery) Gustav C Magrinat, MD as Consulting Physician (Oncology) Stacy Wentworth, MD as Consulting Physician (Radiation Oncology) [name] [name], MD as Consulting Physician (Gynecology)   CHIEF COMPLAINT:   CURRENT TREATMENT:    BREAST CANCER HISTORY: From Dr. Kalsoom Khan's earlier notes:  "Elizabeth Miles is a 47 y.o. female. Without significant past medical history who on June 2013 had a mammogram that was normal. But there was on physical exam possibility of a cyst noted in the right breast. The mammogram was negative. In 2014 June patient noted on exam another lump in the right breast. She underwent a diagnostic mammogram on June 10 that showed a right breast nodule in the outer quadrant. She had an ultrasound performed that showed at the 9:30 o'clock position a 3.6 cm area and then added 10:00 position 1.3 cm area with a total area being anywhere between 5-6 cm. The patient went on to have a right breast biopsy performed in [place]. The pathology revealed an invasive ductal carcinoma. This is been confirmed by our pathology as well. The carcinoma and papillary features and was felt to be between a grade 1 and 2. The tumor was estrogen receptor positive strongly (100%) progesterone receptor negative HER-2/neu negative with a Ki-67 that showed a high proliferation rate. Patient is now seen in medical oncology for discussion of treatment options."  Bilateral breast MRI on 08/27/2012 revealed in the right upper quadrant irregular lobulated mass with a satellite nodule or lobulation within 2 mm at its superior aspect, measured together as 4.5 x 4.0 x 3.8 cm.  There was extension of enhancement to the nipple, suggesting nipple involvement may be present. No lymphadenopathy was noted there was no any other area of abnormal enhancement in either breast (clinical stage IIA, T2 N0).  PET scan performed on 08/29/2012 revealed the primary breast cancer measuring 3.2 x 3.3 cm with SUV of 16. It was adjacent nodule along the superiomedial border of the primary mass measuring 1.4 cm which was also hypermetabolic. There were no additional areas of abnormal hypermetabolism. No abnormal hypermetabolic activity was seen in the chest, abdomen/pelvis,within the liver, pancreas, adrenal glands or spleen. No hypermetabolic lymph nodes. In the skeleton, no focal hypermetabolic activity to suggest skeletal metastasis was seen.  Completed neoadjuvant chemotherapy consisting of Q14 day Adriamycin/Cytoxan x 4 cycles on 10/25/2012, followed by one dose of neoadjuvant Taxol on 11/08/2012. She developed significant grade 2 neuropathy and Taxol was discontinued.  Then received neoadjuvant chemotherapy consisting of single agent Abraxane given on day 1, 8, 15 of each 28 day cycle. She completed therapy on 11/15/12 - 02/14/13.   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 Repeat HER-2 testing was now positive. The patient was started on tamoxifen March 2015 and on Herceptin the same month.  Her subsequent history is as detailed below.  INTERVAL HISTORY: Elizabeth Miles was evaluated in the breast clinic 09/26/2013 accompanied by her parents.  REVIEW OF SYSTEMS: To be concerned she has is left facial numbness. This is in the distribution of the Bell's palsy, but there is   no motor components. She was evaluated for this at [facility], and has had brain MRIs and EEG's all negative. She interrupted her Herceptin treatments because of this,  but finds that if the Herceptin is given slowly she does not get those symptoms. She is willing to we'll give it another try". Otherwise a detailed review of systems today was noncontributory.  PAST MEDICAL HISTORY: Past Medical History  Diagnosis Date  . 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 GERD (gastroesophageal reflux disease)   . GERD (gastroesophageal reflux disease) 08/22/2012  . Allergy   . Complication of anesthesia     1986 ; problem waking up  . Anxiety   . Wears glasses     PAST SURGICAL HISTORY: Past Surgical History  Procedure Laterality Date  . Dilation and curettage of uterus  1993  . Cervical ablation  1983  . Breast biopsy Right 08/06/12  . Popliteal synovial cyst excision  1970  . Portacath placement Left 09/12/2012    Procedure: INSERTION PORT-A-CATH;  Surgeon: Matthew Wakefield, MD;  Location: [facility] OR;  Service: General;  Laterality: Left;  . Breast lumpectomy with needle localization and axillary sentinel lymph node bx Right 03/10/2013    Procedure: BREAST LUMPECTOMY WITH NEEDLE LOCALIZATION AND AXILLARY SENTINEL LYMPH NODE BIOPSY;  Surgeon: Matthew Wakefield, MD;  Location: [facility];  Service: General;  Laterality: Right;  . Port-a-cath removal Left 03/10/2013    Procedure: REMOVAL PORT-A-CATH;  Surgeon: Matthew Wakefield, MD;  Location: [facility];  Service: General;  Laterality: Left;    FAMILY HISTORY Family History  Problem Relation Age of Onset  . Hypertension Mother   . Bladder Cancer Maternal Uncle 68  . Cancer Paternal Grandmother 63    lung cancer  . Lung cancer Paternal Grandfather     dx in his 60s  . COPD Paternal Uncle    the patient's parents are living and in good health. The patient has one brother, no sisters. There is no history of breast or ovarian cancer in the family to her knowledge  GYNECOLOGIC HISTORY:  Patient's last menstrual period was 03/12/2012. Menarche age 12, first live birth  age 26. The patient is GX P3. 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 Her husband, Ronald, is a construction superintendent. As daughter Elizabeth Miles, 23, lives in [place] and daughter Elizabeth Miles just had a baby girl, the patient's first grandchild. Son Ronald "Brianne" is 11 and at home. The patient is a [employer].    ADVANCED DIRECTIVES:    HEALTH MAINTENANCE: History  Substance Use Topics  . Smoking status: Never Smoker   . Smokeless tobacco: Never Used  . Alcohol Use: No     Colonoscopy:  PAP:  Bone density:  Lipid panel:  Allergies  Allergen Reactions  . Darvocet [Propoxyphene N-Acetaminophen] Shortness Of Breath and Swelling  . Shellfish Allergy Shortness Of Breath and Swelling  . Other     CT dye, "chest hurt" "makes me feel cold"    Current Outpatient Prescriptions  Medication Sig Dispense Refill  . acetaminophen (TYLENOL) 500 MG tablet Take 1,000 mg by mouth every 6 (six) hours as needed.      . Dexlansoprazole (DEXILANT) 30 MG capsule Take 1 capsule (30 mg total) by mouth daily.  90 capsule  4  . gabapentin (NEURONTIN) 100 MG capsule Take as needed   for facial tingling, once daily  30 capsule  3  . ibuprofen (ADVIL,MOTRIN) 800 MG tablet Take 800 mg by mouth every 8 (eight) hours as needed for moderate pain (for back pain).      . tamoxifen (NOLVADEX) 20 MG tablet Take 1 tablet (20 mg total) by mouth daily.  90 tablet  3   No current facility-administered medications for this visit.    OBJECTIVE: Middle-aged white woman in no acute distress Filed Vitals:   09/26/13 0929  BP: 133/78  Pulse: 91  Temp: 98.1 F (36.7 C)  Resp: 18     Body mass index is 33.95 kg/(m^2).    ECOG FS:1 - Symptomatic but completely ambulatory  Ocular: Sclerae unicteric, pupils equal, round and reactive to light, no ptosis, normal EOMs Ear-nose-throat:  Oropharynx clear and moist Lymphatic: No cervical or supraclavicular adenopathy Lungs no rales or rhonchi, good excursion bilaterally Heart regular rate and rhythm, no murmur appreciated Abd soft, nontender, positive bowel sounds MSK no focal spinal tenderness, no upper extremity lymphedema Neuro: non-focal, well-oriented, anxious affect Breasts: The right breast is status post lumpectomy and radiation. There is no evidence of local recurrence. The right axilla is benign. Left breast is unremarkable.   LAB RESULTS:  CMP     Component Value Date/Time   NA 141 09/26/2013 0903   NA 144 08/17/2013 0346   K 3.9 09/26/2013 0903   K 3.8 08/17/2013 0346   CL 105 08/17/2013 0346   CO2 22 09/26/2013 0903   GLUCOSE 94 09/26/2013 0903   GLUCOSE 155* 08/17/2013 0346   BUN 9.0 09/26/2013 0903   BUN 8 08/17/2013 0346   CREATININE 0.8 09/26/2013 0903   CREATININE 0.80 08/17/2013 0346   CALCIUM 9.3 09/26/2013 0903   PROT 7.0 09/26/2013 0903   ALBUMIN 3.7 09/26/2013 0903   AST 26 09/26/2013 0903   ALT 30 09/26/2013 0903   ALKPHOS 69 09/26/2013 0903   BILITOT 0.54 09/26/2013 0903    I No results found for this basename: SPEP,  UPEP,   kappa and lambda light chains    Lab Results  Component Value Date   WBC 3.3* 09/26/2013   NEUTROABS 1.9 09/26/2013   HGB 12.7 09/26/2013   HCT 37.8 09/26/2013   MCV 89.8 09/26/2013   PLT 231 09/26/2013      Chemistry      Component Value Date/Time   NA 141 09/26/2013 0903   NA 144 08/17/2013 0346   K 3.9 09/26/2013 0903   K 3.8 08/17/2013 0346   CL 105 08/17/2013 0346   CO2 22 09/26/2013 0903   BUN 9.0 09/26/2013 0903   BUN 8 08/17/2013 0346   CREATININE 0.8 09/26/2013 0903   CREATININE 0.80 08/17/2013 0346      Component Value Date/Time   CALCIUM 9.3 09/26/2013 0903   ALKPHOS 69 09/26/2013 0903   AST 26 09/26/2013 0903   ALT 30 09/26/2013 0903   BILITOT 0.54 09/26/2013 0903       No results found for this basename: LABCA2    No components found with this basename:  LABCA125    No results found for this basename: INR,  in the last 168 hours  Urinalysis    Component Value Date/Time   LABSPEC 1.010 12/13/2012 1435   GLUCOSEU Negative 12/13/2012 1435   UROBILINOGEN 0.2 12/13/2012 1435    STUDIES: No results found.  ASSESSMENT: 48 y.o. [place] woman  (1) status post right breast biopsy 08/06/2012 for a clinical mT2 N0,  stage IIA invasive ductal carcinoma, estrogen receptor 3+ positive, progesterone receptor and HER-2 negative, Ki67 3+ [[id number]]  (2) treated neoadjuvant [place] dose dense cyclophosphamide and doxorubicin x4, completed 10/25/2012, followed by a single dose of weekly paclitaxel, poorly tolerated; followed by 11 doses of Abraxane completed 02/14/2013  (3) status post right lumpectomy and sentinel lymph node sampling 03/10/2013 for a pT2 pN0, stage IIA invasive ductal carcinoma, grade 2, estrogen receptor 100% positive, progesterone receptor 21% positive, with an MIB-1 of 14%, and HER-2 amplified, the signals ratio being 2.52, the number per cell 2.90  (4) adjuvant radiation completed March 2015 (?)  (5) started tamoxifen March 2015  (6) started trastuzumab 05/23/2013; echocardiogram 09/15/2013 shows a normal ejection fraction   PLAN: We spent approximately one hour today going over Elsye's history, treatment plan, and overall prognosis. She understands being HER-2 positive is very favorable: In general chemotherapy decreases risk of systemic recurrence by about one third, the anti-estrogens by about one half, and the anti-HER-2 immunotherapy by another half. Accordingly I strongly encouraged her to continue on these treatments. I reassured her that the sensory changes she has on her left facial nerve are unlikely to be related to the Herceptin but it is simple enough to give the drug a little bit more slowly, since she feels that is helpful. We also discussed dexamethasone and lorazepam in the premeds but she tells me they  lorazepam dries her "loopy" and she would rather just use the dexamethasone.  Plan accordingly is to continue the trastuzumab through March of 2016. We are going to continue the tamoxifen at least 2 years and then consider whether we want to switch to an aromatase inhibitor or not.  The patient has a good understanding of the overall plan. She agrees with it. She knows the goal of treatment in her case is cure. She will call with any problems that may develop before her next visit here.  MAGRINAT,GUSTAV C, MD   09/27/2013 5:19 PM   

## 2013-09-27 DIAGNOSIS — C50411 Malignant neoplasm of upper-outer quadrant of right female breast: Secondary | ICD-10-CM | POA: Insufficient documentation

## 2013-09-27 DIAGNOSIS — Z170 Estrogen receptor positive status [ER+]: Secondary | ICD-10-CM

## 2013-10-17 ENCOUNTER — Ambulatory Visit (HOSPITAL_BASED_OUTPATIENT_CLINIC_OR_DEPARTMENT_OTHER): Payer: BC Managed Care – PPO

## 2013-10-17 ENCOUNTER — Other Ambulatory Visit (HOSPITAL_BASED_OUTPATIENT_CLINIC_OR_DEPARTMENT_OTHER): Payer: BC Managed Care – PPO

## 2013-10-17 ENCOUNTER — Other Ambulatory Visit: Payer: BC Managed Care – PPO

## 2013-10-17 VITALS — BP 132/76 | HR 96 | Temp 98.3°F

## 2013-10-17 DIAGNOSIS — C50419 Malignant neoplasm of upper-outer quadrant of unspecified female breast: Secondary | ICD-10-CM

## 2013-10-17 DIAGNOSIS — Z5112 Encounter for antineoplastic immunotherapy: Secondary | ICD-10-CM

## 2013-10-17 DIAGNOSIS — C50919 Malignant neoplasm of unspecified site of unspecified female breast: Secondary | ICD-10-CM

## 2013-10-17 LAB — CBC WITH DIFFERENTIAL/PLATELET
BASO%: 0.7 % (ref 0.0–2.0)
Basophils Absolute: 0 10*3/uL (ref 0.0–0.1)
EOS%: 6 % (ref 0.0–7.0)
Eosinophils Absolute: 0.3 10*3/uL (ref 0.0–0.5)
HCT: 38.8 % (ref 34.8–46.6)
HGB: 12.9 g/dL (ref 11.6–15.9)
LYMPH#: 1.1 10*3/uL (ref 0.9–3.3)
LYMPH%: 25.3 % (ref 14.0–49.7)
MCH: 29.7 pg (ref 25.1–34.0)
MCHC: 33.2 g/dL (ref 31.5–36.0)
MCV: 89.4 fL (ref 79.5–101.0)
MONO#: 0.3 10*3/uL (ref 0.1–0.9)
MONO%: 6.7 % (ref 0.0–14.0)
NEUT#: 2.8 10*3/uL (ref 1.5–6.5)
NEUT%: 61.3 % (ref 38.4–76.8)
NRBC: 0 % (ref 0–0)
Platelets: 272 10*3/uL (ref 145–400)
RBC: 4.34 10*6/uL (ref 3.70–5.45)
RDW: 12.7 % (ref 11.2–14.5)
WBC: 4.5 10*3/uL (ref 3.9–10.3)

## 2013-10-17 MED ORDER — DEXAMETHASONE SODIUM PHOSPHATE 10 MG/ML IJ SOLN
INTRAMUSCULAR | Status: AC
  Filled 2013-10-17: qty 1

## 2013-10-17 MED ORDER — SODIUM CHLORIDE 0.9 % IV SOLN
Freq: Once | INTRAVENOUS | Status: AC
  Administered 2013-10-17: 16:00:00 via INTRAVENOUS

## 2013-10-17 MED ORDER — DEXAMETHASONE SODIUM PHOSPHATE 10 MG/ML IJ SOLN
4.0000 mg | Freq: Once | INTRAMUSCULAR | Status: AC
  Administered 2013-10-17: 4 mg via INTRAVENOUS

## 2013-10-17 MED ORDER — SODIUM CHLORIDE 0.9 % IV SOLN
6.0000 mg/kg | Freq: Once | INTRAVENOUS | Status: AC
Start: 2013-10-17 — End: 2013-10-17
  Administered 2013-10-17: 567 mg via INTRAVENOUS
  Filled 2013-10-17: qty 27

## 2013-10-17 MED ORDER — DIPHENHYDRAMINE HCL 25 MG PO CAPS
ORAL_CAPSULE | ORAL | Status: AC
  Filled 2013-10-17: qty 1

## 2013-10-17 MED ORDER — DIPHENHYDRAMINE HCL 25 MG PO CAPS
25.0000 mg | ORAL_CAPSULE | Freq: Once | ORAL | Status: AC
  Administered 2013-10-17: 25 mg via ORAL

## 2013-10-17 NOTE — Progress Notes (Signed)
Per Dr Jana Hakim, ok to infuse herceptin over one hour.

## 2013-11-07 ENCOUNTER — Ambulatory Visit (HOSPITAL_BASED_OUTPATIENT_CLINIC_OR_DEPARTMENT_OTHER): Payer: BC Managed Care – PPO

## 2013-11-07 ENCOUNTER — Encounter: Payer: Self-pay | Admitting: Nurse Practitioner

## 2013-11-07 ENCOUNTER — Other Ambulatory Visit (HOSPITAL_BASED_OUTPATIENT_CLINIC_OR_DEPARTMENT_OTHER): Payer: BC Managed Care – PPO

## 2013-11-07 ENCOUNTER — Telehealth: Payer: Self-pay | Admitting: Nurse Practitioner

## 2013-11-07 ENCOUNTER — Ambulatory Visit (HOSPITAL_BASED_OUTPATIENT_CLINIC_OR_DEPARTMENT_OTHER): Payer: BC Managed Care – PPO | Admitting: Nurse Practitioner

## 2013-11-07 VITALS — BP 144/84 | HR 72 | Temp 97.9°F | Resp 18 | Ht 67.0 in | Wt 207.7 lb

## 2013-11-07 DIAGNOSIS — C50919 Malignant neoplasm of unspecified site of unspecified female breast: Secondary | ICD-10-CM

## 2013-11-07 DIAGNOSIS — C50419 Malignant neoplasm of upper-outer quadrant of unspecified female breast: Secondary | ICD-10-CM

## 2013-11-07 DIAGNOSIS — Z5112 Encounter for antineoplastic immunotherapy: Secondary | ICD-10-CM

## 2013-11-07 DIAGNOSIS — R200 Anesthesia of skin: Secondary | ICD-10-CM | POA: Insufficient documentation

## 2013-11-07 DIAGNOSIS — C50411 Malignant neoplasm of upper-outer quadrant of right female breast: Secondary | ICD-10-CM

## 2013-11-07 DIAGNOSIS — Z170 Estrogen receptor positive status [ER+]: Secondary | ICD-10-CM

## 2013-11-07 LAB — CBC WITH DIFFERENTIAL/PLATELET
BASO%: 0.8 % (ref 0.0–2.0)
BASOS ABS: 0 10*3/uL (ref 0.0–0.1)
EOS ABS: 0.2 10*3/uL (ref 0.0–0.5)
EOS%: 5.3 % (ref 0.0–7.0)
HCT: 38.5 % (ref 34.8–46.6)
HEMOGLOBIN: 12.8 g/dL (ref 11.6–15.9)
LYMPH#: 1.1 10*3/uL (ref 0.9–3.3)
LYMPH%: 30.2 % (ref 14.0–49.7)
MCH: 30.2 pg (ref 25.1–34.0)
MCHC: 33.2 g/dL (ref 31.5–36.0)
MCV: 90.8 fL (ref 79.5–101.0)
MONO#: 0.4 10*3/uL (ref 0.1–0.9)
MONO%: 10.1 % (ref 0.0–14.0)
NEUT%: 53.6 % (ref 38.4–76.8)
NEUTROS ABS: 2 10*3/uL (ref 1.5–6.5)
Platelets: 213 10*3/uL (ref 145–400)
RBC: 4.24 10*6/uL (ref 3.70–5.45)
RDW: 13 % (ref 11.2–14.5)
WBC: 3.8 10*3/uL — AB (ref 3.9–10.3)
nRBC: 0 % (ref 0–0)

## 2013-11-07 MED ORDER — TRASTUZUMAB CHEMO INJECTION 440 MG
6.0000 mg/kg | Freq: Once | INTRAVENOUS | Status: AC
  Administered 2013-11-07: 567 mg via INTRAVENOUS
  Filled 2013-11-07: qty 27

## 2013-11-07 MED ORDER — DEXAMETHASONE SODIUM PHOSPHATE 10 MG/ML IJ SOLN
4.0000 mg | Freq: Once | INTRAMUSCULAR | Status: AC
  Administered 2013-11-07: 4 mg via INTRAVENOUS

## 2013-11-07 MED ORDER — SODIUM CHLORIDE 0.9 % IV SOLN
Freq: Once | INTRAVENOUS | Status: AC
  Administered 2013-11-07: 16:00:00 via INTRAVENOUS

## 2013-11-07 MED ORDER — DEXAMETHASONE SODIUM PHOSPHATE 10 MG/ML IJ SOLN
INTRAMUSCULAR | Status: AC
  Filled 2013-11-07: qty 1

## 2013-11-07 MED ORDER — DIPHENHYDRAMINE HCL 25 MG PO CAPS
25.0000 mg | ORAL_CAPSULE | Freq: Once | ORAL | Status: AC
  Administered 2013-11-07: 25 mg via ORAL

## 2013-11-07 MED ORDER — DIPHENHYDRAMINE HCL 25 MG PO CAPS
ORAL_CAPSULE | ORAL | Status: AC
  Filled 2013-11-07: qty 1

## 2013-11-07 NOTE — Patient Instructions (Signed)

## 2013-11-07 NOTE — Progress Notes (Signed)
[place]  Telephone:(336) [phone] Fax:(336) [phone]     ID: Elizabeth Miles DOB: 1965/04/14  MR#: [id number]  LGX#:[id number]  Patient Care Team: Clinton Quant, MD as PCP - General (Internal Medicine) Clinton Quant, MD as Referring Physician (Internal Medicine) Rolm Bookbinder, MD as Consulting Physician (General Surgery) Chauncey Cruel, MD as Consulting Physician (Oncology) Thea Silversmith, MD as Consulting Physician (Radiation Oncology) San Morelle, MD as Consulting Physician (Gynecology)   CHIEF COMPLAINT: right breat cancer  CURRENT TREATMENT: trastuzumab q 3 weeks  BREAST CANCER HISTORY: From Dr. Dana Allan earlier notes:  "Elizabeth Miles is a 48 y.o. female. Without significant past medical history who on June 2013 had a mammogram that was normal. But there was on physical exam possibility of a cyst noted in the right breast. The mammogram was negative. In 2014 June patient noted on exam another lump in the right breast. She underwent a diagnostic mammogram on June 10 that showed a right breast nodule in the outer quadrant. She had an ultrasound performed that showed at the 9:30 o'clock position a 3.6 cm area and then added 10:00 position 1.3 cm area with a total area being anywhere between 5-6 cm. The patient went on to have a right breast biopsy performed in [place]. The pathology revealed an invasive ductal carcinoma. This is been confirmed by our pathology as well. The carcinoma and papillary features and was felt to be between a grade 1 and 2. The tumor was estrogen receptor positive strongly (100%) progesterone receptor negative HER-2/neu negative with a Ki-67 that showed a high proliferation rate. Patient is now seen in medical oncology for discussion of treatment options."  Bilateral breast MRI on 08/27/2012 revealed in the right upper quadrant irregular lobulated mass with a satellite nodule or lobulation within 2 mm at its superior aspect,  measured together as 4.5 x 4.0 x 3.8 cm. There was extension of enhancement to the nipple, suggesting nipple involvement may be present. No lymphadenopathy was noted there was no any other area of abnormal enhancement in either breast (clinical stage IIA, T2 N0).  PET scan performed on 08/29/2012 revealed the primary breast cancer measuring 3.2 x 3.3 cm with SUV of 16. It was adjacent nodule along the superiomedial border of the primary mass measuring 1.4 cm which was also hypermetabolic. There were no additional areas of abnormal hypermetabolism. No abnormal hypermetabolic activity was seen in the chest, abdomen/pelvis,within the liver, pancreas, adrenal glands or spleen. No hypermetabolic lymph nodes. In the skeleton, no focal hypermetabolic activity to suggest skeletal metastasis was seen.  Completed neoadjuvant chemotherapy consisting of Q14 day Adriamycin/Cytoxan x 4 cycles on 10/25/2012, followed by one dose of neoadjuvant Taxol on 11/08/2012. She developed significant grade 2 neuropathy and Taxol was discontinued.  Then received neoadjuvant chemotherapy consisting of single agent Abraxane given on day 1, 8, 15 of each 28 day cycle. She completed therapy on 11/15/12 - 02/14/13.   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 Repeat HER-2 testing was now positive. The patient was started on tamoxifen March 2015 and on Herceptin the same month.  Her subsequent history is as detailed below.  INTERVAL HISTORY: Elizabeth Miles returns to clinic today with her parents for follow up of her breast cancer. She has been on tamoxifen since March of this year. She is tolerating this well with no side  effects that she can appreciate. She denies hot flashes or vaginal changes. She is also due for  trastuzumab today, which she receives every 3 weeks. As noted on her visit  with Dr. Jana Hakim on 09/26/13, she prefers the  trastuzumab to be given slowly to avoid left facial numbness. This was done with the previous infusion and she states it went well and maybe only felt this effect for 15 minutes on her way home.  REVIEW OF SYSTEMS: A detailed review of systems was entirely negative except as noted above.    PAST MEDICAL HISTORY: Past Medical History  Diagnosis Date  . 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 GERD (gastroesophageal reflux disease)   . GERD (gastroesophageal reflux disease) 08/22/2012  . Allergy   . Complication of anesthesia     1986 ; problem waking up  . Anxiety   . Wears glasses     PAST SURGICAL HISTORY: Past Surgical History  Procedure Laterality Date  . Dilation and curettage of uterus  1993  . Cervical ablation  1983  . Breast biopsy Right 08/06/12  . Popliteal synovial cyst excision  1970  . Portacath placement Left 09/12/2012    Procedure: INSERTION PORT-A-CATH;  Surgeon: Rolm Bookbinder, MD;  Location: [facility];  Service: General;  Laterality: Left;  . Breast lumpectomy with needle localization and axillary sentinel lymph node bx Right 03/10/2013    Procedure: BREAST LUMPECTOMY WITH NEEDLE LOCALIZATION AND AXILLARY SENTINEL LYMPH NODE BIOPSY;  Surgeon: Rolm Bookbinder, MD;  Location: [facility];  Service: General;  Laterality: Right;  . Port-a-cath removal Left 03/10/2013    Procedure: REMOVAL PORT-A-CATH;  Surgeon: Rolm Bookbinder, MD;  Location: [facility];  Service: General;  Laterality: Left;    FAMILY HISTORY Family History  Problem Relation Age of Onset  . Hypertension Mother   . Bladder Cancer Maternal Uncle 68  . Cancer Paternal Grandmother 36    lung cancer  . Lung cancer Paternal Grandfather     dx in his 65s  . COPD Paternal Uncle    the patient's parents are living and in good health. The patient has one brother, no sisters. There is no history of breast or ovarian  cancer in the family to her knowledge  GYNECOLOGIC HISTORY:  Patient's last menstrual period was 03/12/2012. Menarche age 76, first live birth age 19. The patient is GX P3. 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 Her husband, Jori Moll, is a Tour manager. As daughter Lovena Le, 62, lives in [place] and daughter Caryl Pina just had a baby girl, the patient's first grandchild. Son Jori Moll "Carlynn Spry" is 74 and at home. The patient is a Psychologist, forensic.    ADVANCED DIRECTIVES:    HEALTH MAINTENANCE: History  Substance Use Topics  . Smoking status: Never Smoker   . Smokeless tobacco: Never Used  . Alcohol Use: No     Colonoscopy:  PAP:  Bone density:  Lipid panel:  Allergies  Allergen Reactions  . Darvocet [Propoxyphene N-Acetaminophen] Shortness Of Breath and Swelling  . Shellfish Allergy Shortness Of Breath and Swelling  . Other     CT dye, "chest hurt" "makes me feel cold"    Current Outpatient Prescriptions  Medication Sig Dispense Refill  . acetaminophen (TYLENOL) 500 MG tablet Take 1,000 mg by mouth every 6 (six) hours as needed.      Marland Kitchen  Dexlansoprazole (DEXILANT) 30 MG capsule Take 1 capsule (30 mg total) by mouth daily.  90 capsule  4  . gabapentin (NEURONTIN) 100 MG capsule Take as needed for facial tingling, once daily  30 capsule  3  . ibuprofen (ADVIL,MOTRIN) 800 MG tablet Take 800 mg by mouth every 8 (eight) hours as needed for moderate pain (for back pain).      . tamoxifen (NOLVADEX) 20 MG tablet Take 1 tablet (20 mg total) by mouth daily.  90 tablet  3   No current facility-administered medications for this visit.    OBJECTIVE: Middle-aged white woman in no acute distress Filed Vitals:   11/07/13 1506  BP: 144/84  Pulse: 72  Temp: 97.9 F (36.6 C)  Resp: 18     Body mass index is 32.52 kg/(m^2).    ECOG FS:1 -  Symptomatic but completely ambulatory  Skin: warm, dry  HEENT: sclerae anicteric, conjunctivae pink, oropharynx clear. No thrush or mucositis.  Lymph Nodes: No cervical or supraclavicular lymphadenopathy  Lungs: clear to auscultation bilaterally, no rales, wheezes, or rhonci  Heart: regular rate and rhythm  Abdomen: round, soft, non tender, positive bowel sounds  Musculoskeletal: No focal spinal tenderness, no peripheral edema  Neuro: non focal, well oriented, positive affect  Breast: deferred  LAB RESULTS:  CMP     Component Value Date/Time   NA 141 09/26/2013 0903   NA 144 08/17/2013 0346   K 3.9 09/26/2013 0903   K 3.8 08/17/2013 0346   CL 105 08/17/2013 0346   CO2 22 09/26/2013 0903   GLUCOSE 94 09/26/2013 0903   GLUCOSE 155* 08/17/2013 0346   BUN 9.0 09/26/2013 0903   BUN 8 08/17/2013 0346   CREATININE 0.8 09/26/2013 0903   CREATININE 0.80 08/17/2013 0346   CALCIUM 9.3 09/26/2013 0903   PROT 7.0 09/26/2013 0903   ALBUMIN 3.7 09/26/2013 0903   AST 26 09/26/2013 0903   ALT 30 09/26/2013 0903   ALKPHOS 69 09/26/2013 0903   BILITOT 0.54 09/26/2013 0903    I No results found for this basename: SPEP,  UPEP,   kappa and lambda light chains    Lab Results  Component Value Date   WBC 3.8* 11/07/2013   NEUTROABS 2.0 11/07/2013   HGB 12.8 11/07/2013   HCT 38.5 11/07/2013   MCV 90.8 11/07/2013   PLT 213 11/07/2013      Chemistry      Component Value Date/Time   NA 141 09/26/2013 0903   NA 144 08/17/2013 0346   K 3.9 09/26/2013 0903   K 3.8 08/17/2013 0346   CL 105 08/17/2013 0346   CO2 22 09/26/2013 0903   BUN 9.0 09/26/2013 0903   BUN 8 08/17/2013 0346   CREATININE 0.8 09/26/2013 0903   CREATININE 0.80 08/17/2013 0346      Component Value Date/Time   CALCIUM 9.3 09/26/2013 0903   ALKPHOS 69 09/26/2013 0903   AST 26 09/26/2013 0903   ALT 30 09/26/2013 0903   BILITOT 0.54 09/26/2013 0903       No results found for this basename: LABCA2    No components found with this basename:  LABCA125    No results found for this basename: INR,  in the last 168 hours  Urinalysis    Component Value Date/Time   LABSPEC 1.010 12/13/2012 1435   GLUCOSEU Negative 12/13/2012 1435   UROBILINOGEN 0.2 12/13/2012 1435    STUDIES: No results found.  ASSESSMENT: 48 y.o. [place] woman  (1) status  post right breast biopsy 08/06/2012 for a clinical mT2 N0, stage IIA invasive ductal carcinoma, estrogen receptor 3+ positive, progesterone receptor and HER-2 negative, Ki67 3+ [[id number]]  (2) treated neoadjuvant [place] dose dense cyclophosphamide and doxorubicin x4, completed 10/25/2012, followed by a single dose of weekly paclitaxel, poorly tolerated; followed by 11 doses of Abraxane completed 02/14/2013  (3) status post right lumpectomy and sentinel lymph node sampling 03/10/2013 for a pT2 pN0, stage IIA invasive ductal carcinoma, grade 2, estrogen receptor 100% positive, progesterone receptor 21% positive, with an MIB-1 of 14%, and HER-2 amplified, the signals ratio being 2.52, the number per cell 2.90  (4) adjuvant radiation completed March 2015 (?)  (5) started tamoxifen March 2015  (6) started trastuzumab 05/23/2013; echocardiogram 09/15/2013 shows a normal ejection fraction   PLAN: Jaiona is doing well as far as her breast cancer is concerned. The labs were reviewed in detail with her and were entirely negative. She is tolerating the tamoxifen well and we will continue this for at least 2 years and then discuss switching to an aromatase inhibitor. She will proceed with the trastuzumab today and will continue this until March 2016.  Christinea will return in 3 weeks for her next dose. She understands and agrees with the plan. She know a goal of treatment in her case is cure. She has been encouraged to call with any issues that might arise in her next visit here.   Marcelino Duster, NP   11/07/2013 3:57 PM

## 2013-11-10 ENCOUNTER — Telehealth: Payer: Self-pay | Admitting: *Deleted

## 2013-11-10 ENCOUNTER — Telehealth: Payer: Self-pay | Admitting: Nurse Practitioner

## 2013-11-10 NOTE — Telephone Encounter (Signed)
Per staff message and POF I have scheduled appts. Advised scheduler of appts. JMW  

## 2013-11-10 NOTE — Telephone Encounter (Signed)
, °

## 2013-11-10 NOTE — Telephone Encounter (Signed)
I have scheduled injection per pharmacy.

## 2013-11-28 ENCOUNTER — Other Ambulatory Visit (HOSPITAL_BASED_OUTPATIENT_CLINIC_OR_DEPARTMENT_OTHER): Payer: BC Managed Care – PPO

## 2013-11-28 ENCOUNTER — Ambulatory Visit (HOSPITAL_BASED_OUTPATIENT_CLINIC_OR_DEPARTMENT_OTHER): Payer: BC Managed Care – PPO | Admitting: Nurse Practitioner

## 2013-11-28 ENCOUNTER — Other Ambulatory Visit: Payer: BC Managed Care – PPO

## 2013-11-28 ENCOUNTER — Telehealth: Payer: Self-pay | Admitting: *Deleted

## 2013-11-28 ENCOUNTER — Encounter: Payer: Self-pay | Admitting: Oncology

## 2013-11-28 ENCOUNTER — Telehealth: Payer: Self-pay | Admitting: Nurse Practitioner

## 2013-11-28 ENCOUNTER — Ambulatory Visit: Payer: BC Managed Care – PPO | Admitting: Nurse Practitioner

## 2013-11-28 ENCOUNTER — Ambulatory Visit (HOSPITAL_BASED_OUTPATIENT_CLINIC_OR_DEPARTMENT_OTHER): Payer: BC Managed Care – PPO

## 2013-11-28 ENCOUNTER — Encounter: Payer: Self-pay | Admitting: Nurse Practitioner

## 2013-11-28 VITALS — BP 121/74 | HR 72 | Temp 98.2°F | Resp 18 | Ht 67.0 in | Wt 198.7 lb

## 2013-11-28 DIAGNOSIS — C50411 Malignant neoplasm of upper-outer quadrant of right female breast: Secondary | ICD-10-CM

## 2013-11-28 DIAGNOSIS — C50919 Malignant neoplasm of unspecified site of unspecified female breast: Secondary | ICD-10-CM

## 2013-11-28 DIAGNOSIS — Z5112 Encounter for antineoplastic immunotherapy: Secondary | ICD-10-CM

## 2013-11-28 DIAGNOSIS — R200 Anesthesia of skin: Secondary | ICD-10-CM

## 2013-11-28 DIAGNOSIS — Z170 Estrogen receptor positive status [ER+]: Secondary | ICD-10-CM

## 2013-11-28 LAB — COMPREHENSIVE METABOLIC PANEL (CC13)
ALK PHOS: 61 U/L (ref 40–150)
ALT: 30 U/L (ref 0–55)
AST: 27 U/L (ref 5–34)
Albumin: 4 g/dL (ref 3.5–5.0)
Anion Gap: 8 mEq/L (ref 3–11)
BUN: 11 mg/dL (ref 7.0–26.0)
CO2: 23 mEq/L (ref 22–29)
CREATININE: 0.8 mg/dL (ref 0.6–1.1)
Calcium: 9.5 mg/dL (ref 8.4–10.4)
Chloride: 106 mEq/L (ref 98–109)
GLUCOSE: 88 mg/dL (ref 70–140)
Potassium: 4.8 mEq/L (ref 3.5–5.1)
Sodium: 137 mEq/L (ref 136–145)
Total Bilirubin: 0.51 mg/dL (ref 0.20–1.20)
Total Protein: 7.3 g/dL (ref 6.4–8.3)

## 2013-11-28 LAB — CBC WITH DIFFERENTIAL/PLATELET
BASO%: 1.1 % (ref 0.0–2.0)
BASOS ABS: 0 10*3/uL (ref 0.0–0.1)
EOS ABS: 0.1 10*3/uL (ref 0.0–0.5)
EOS%: 2.4 % (ref 0.0–7.0)
HCT: 38.5 % (ref 34.8–46.6)
HEMOGLOBIN: 13 g/dL (ref 11.6–15.9)
LYMPH%: 29.7 % (ref 14.0–49.7)
MCH: 30.1 pg (ref 25.1–34.0)
MCHC: 33.8 g/dL (ref 31.5–36.0)
MCV: 89.1 fL (ref 79.5–101.0)
MONO#: 0.3 10*3/uL (ref 0.1–0.9)
MONO%: 7.3 % (ref 0.0–14.0)
NEUT%: 59.5 % (ref 38.4–76.8)
NEUTROS ABS: 2.2 10*3/uL (ref 1.5–6.5)
PLATELETS: 240 10*3/uL (ref 145–400)
RBC: 4.32 10*6/uL (ref 3.70–5.45)
RDW: 12.8 % (ref 11.2–14.5)
WBC: 3.7 10*3/uL — AB (ref 3.9–10.3)
lymph#: 1.1 10*3/uL (ref 0.9–3.3)
nRBC: 0 % (ref 0–0)

## 2013-11-28 MED ORDER — DEXAMETHASONE SODIUM PHOSPHATE 10 MG/ML IJ SOLN
INTRAMUSCULAR | Status: AC
  Filled 2013-11-28: qty 1

## 2013-11-28 MED ORDER — TRASTUZUMAB CHEMO INJECTION 440 MG
6.0000 mg/kg | Freq: Once | INTRAVENOUS | Status: AC
  Administered 2013-11-28: 567 mg via INTRAVENOUS
  Filled 2013-11-28: qty 27

## 2013-11-28 MED ORDER — DEXAMETHASONE SODIUM PHOSPHATE 10 MG/ML IJ SOLN
4.0000 mg | Freq: Once | INTRAMUSCULAR | Status: AC
  Administered 2013-11-28: 4 mg via INTRAVENOUS

## 2013-11-28 MED ORDER — DIPHENHYDRAMINE HCL 25 MG PO CAPS
25.0000 mg | ORAL_CAPSULE | Freq: Once | ORAL | Status: AC
  Administered 2013-11-28: 25 mg via ORAL

## 2013-11-28 MED ORDER — SODIUM CHLORIDE 0.9 % IV SOLN
Freq: Once | INTRAVENOUS | Status: AC
  Administered 2013-11-28: 15:00:00 via INTRAVENOUS

## 2013-11-28 MED ORDER — DIPHENHYDRAMINE HCL 25 MG PO CAPS
ORAL_CAPSULE | ORAL | Status: AC
  Filled 2013-11-28: qty 1

## 2013-11-28 NOTE — Progress Notes (Signed)
Elizabeth Miles  Telephone:(336) [phone] Fax:(336) [phone]     ID: Elizabeth Miles DOB: 1965-08-23  MR#: [id number]  XTG#:[id number]  Patient Care Team: Clinton Quant, MD as PCP - General (Internal Medicine) Clinton Quant, MD as Referring Physician (Internal Medicine) Rolm Bookbinder, MD as Consulting Physician (General Surgery) Chauncey Cruel, MD as Consulting Physician (Oncology) Thea Silversmith, MD as Consulting Physician (Radiation Oncology) San Morelle, MD as Consulting Physician (Gynecology)   CHIEF COMPLAINT: right breat cancer  CURRENT TREATMENT: trastuzumab q 3 weeks, tamoxifen daily  BREAST CANCER HISTORY: From Dr. Dana Allan earlier notes:  "Elizabeth Miles is a 48 y.o. female. Without significant past medical history who on June 2013 had a mammogram that was normal. But there was on physical exam possibility of a cyst noted in the right breast. The mammogram was negative. In 2014 June patient noted on exam another lump in the right breast. She underwent a diagnostic mammogram on June 10 that showed a right breast nodule in the outer quadrant. She had an ultrasound performed that showed at the 9:30 o'clock position a 3.6 cm area and then added 10:00 position 1.3 cm area with a total area being anywhere between 5-6 cm. The patient went on to have a right breast biopsy performed in [place]. The pathology revealed an invasive ductal carcinoma. This is been confirmed by our pathology as well. The carcinoma and papillary features and was felt to be between a grade 1 and 2. The tumor was estrogen receptor positive strongly (100%) progesterone receptor negative HER-2/neu negative with a Ki-67 that showed a high proliferation rate. Patient is now seen in medical oncology for discussion of treatment options."  Bilateral breast MRI on 08/27/2012 revealed in the right upper quadrant irregular lobulated mass with a satellite nodule or lobulation within 2 mm at its  superior aspect, measured together as 4.5 x 4.0 x 3.8 cm. There was extension of enhancement to the nipple, suggesting nipple involvement may be present. No lymphadenopathy was noted there was no any other area of abnormal enhancement in either breast (clinical stage IIA, T2 N0).  PET scan performed on 08/29/2012 revealed the primary breast cancer measuring 3.2 x 3.3 cm with SUV of 16. It was adjacent nodule along the superiomedial border of the primary mass measuring 1.4 cm which was also hypermetabolic. There were no additional areas of abnormal hypermetabolism. No abnormal hypermetabolic activity was seen in the chest, abdomen/pelvis,within the liver, pancreas, adrenal glands or spleen. No hypermetabolic lymph nodes. In the skeleton, no focal hypermetabolic activity to suggest skeletal metastasis was seen.  Completed neoadjuvant chemotherapy consisting of Q14 day Adriamycin/Cytoxan x 4 cycles on 10/25/2012, followed by one dose of neoadjuvant Taxol on 11/08/2012. She developed significant grade 2 neuropathy and Taxol was discontinued.  Then received neoadjuvant chemotherapy consisting of single agent Abraxane given on day 1, 8, 15 of each 28 day cycle. She completed therapy on 11/15/12 - 02/14/13.   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 Repeat HER-2 testing was now positive. The patient was started on tamoxifen March 2015 and on Herceptin the same month.  Her subsequent history is as detailed below.  INTERVAL HISTORY: Evon returns to clinic today for follow up of her breast cancer, accompanied by her mother. She continues with tamoxifen daily and trastuzumab every 3 weeks. She is tolerating these treatments  well. She prefers the trastuzumab to be given slowly of avoid left facial numbness, as detailed in her visit with Dr. Jana Hakim on  09/26/13. Last infusion she also took a zyrtec and this helped.   REVIEW OF SYSTEMS: Sheilah denies fevers, chills, nausea, vomiting, or changes in bowel or bladder habits. She has not shortness of breath, chest pain, palpitations, cough, or fatigue. A detailed review of systems is otherwise noncontributory.   PAST MEDICAL HISTORY: Past Medical History  Diagnosis Date  . 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 GERD (gastroesophageal reflux disease)   . GERD (gastroesophageal reflux disease) 08/22/2012  . Allergy   . Complication of anesthesia     1986 ; problem waking up  . Anxiety   . Wears glasses     PAST SURGICAL HISTORY: Past Surgical History  Procedure Laterality Date  . Dilation and curettage of uterus  1993  . Cervical ablation  1983  . Breast biopsy Right 08/06/12  . Popliteal synovial cyst excision  1970  . Portacath placement Left 09/12/2012    Procedure: INSERTION PORT-A-CATH;  Surgeon: Rolm Bookbinder, MD;  Location: [facility];  Service: General;  Laterality: Left;  . Breast lumpectomy with needle localization and axillary sentinel lymph node bx Right 03/10/2013    Procedure: BREAST LUMPECTOMY WITH NEEDLE LOCALIZATION AND AXILLARY SENTINEL LYMPH NODE BIOPSY;  Surgeon: Rolm Bookbinder, MD;  Location: [facility];  Service: General;  Laterality: Right;  . Port-a-cath removal Left 03/10/2013    Procedure: REMOVAL PORT-A-CATH;  Surgeon: Rolm Bookbinder, MD;  Location: [facility];  Service: General;  Laterality: Left;    FAMILY HISTORY Family History  Problem Relation Age of Onset  . Hypertension Mother   . Bladder Cancer Maternal Uncle 68  . Cancer Paternal Grandmother 1    lung cancer  . Lung cancer Paternal Grandfather     dx in his 61s  . COPD Paternal Uncle    the patient's parents are living and in good health. The patient has one brother, no sisters. There is no history of breast or ovarian cancer in the family to her  knowledge  GYNECOLOGIC HISTORY:  Patient's last menstrual period was 03/12/2012. Menarche age 75, first live birth age 64. The patient is GX P3. 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 Her husband, Jori Moll, is a Tour manager. As daughter Lovena Le, 34, lives in [place] and daughter Caryl Pina just had a baby girl, the patient's first grandchild. Son Jori Moll "Carlynn Spry" is 54 and at home. The patient is a Psychologist, forensic.    ADVANCED DIRECTIVES:    HEALTH MAINTENANCE: History  Substance Use Topics  . Smoking status: Never Smoker   . Smokeless tobacco: Never Used  . Alcohol Use: No     Colonoscopy:  PAP:  Bone density:  Lipid panel:  Allergies  Allergen Reactions  . Darvocet [Propoxyphene N-Acetaminophen] Shortness Of Breath and Swelling  . Shellfish Allergy Shortness Of Breath and Swelling  . Other     CT dye, "chest hurt" "makes me feel cold"    Current Outpatient Prescriptions  Medication Sig Dispense Refill  . acetaminophen (TYLENOL) 500 MG tablet Take 1,000 mg by mouth every 6 (six) hours as needed.      Marland Kitchen Dexlansoprazole (DEXILANT) 30 MG capsule Take 1 capsule (30 mg total) by mouth daily.  90 capsule  4  . gabapentin (NEURONTIN) 100 MG capsule Take as needed for facial tingling, once daily  30 capsule  3  . ibuprofen (ADVIL,MOTRIN) 800 MG tablet Take 800 mg by mouth every 8 (eight) hours as needed for moderate pain (for back pain).      . tamoxifen (NOLVADEX) 20 MG tablet Take 1 tablet (20 mg total) by mouth daily.  90 tablet  3   No current facility-administered medications for this visit.    OBJECTIVE: Middle-aged white woman in no acute distress Filed Vitals:   11/28/13 1428  BP: 121/74  Pulse: 72  Temp: 98.2 F (36.8 C)  Resp: 18     Body mass index is 31.11 kg/(m^2).    ECOG FS:1 - Symptomatic but completely  ambulatory  Sclerae unicteric, pupils equal and reactive Oropharynx clear and moist-- no thrush No cervical or supraclavicular adenopathy Lungs no rales or rhonchi Heart regular rate and rhythm Abd soft, nontender, positive bowel sounds MSK no focal spinal tenderness, no upper extremity lymphedema Neuro: nonfocal, well oriented, appropriate affect Breasts: deferred  LAB RESULTS:  CMP     Component Value Date/Time   NA 141 09/26/2013 0903   NA 144 08/17/2013 0346   K 3.9 09/26/2013 0903   K 3.8 08/17/2013 0346   CL 105 08/17/2013 0346   CO2 22 09/26/2013 0903   GLUCOSE 94 09/26/2013 0903   GLUCOSE 155* 08/17/2013 0346   BUN 9.0 09/26/2013 0903   BUN 8 08/17/2013 0346   CREATININE 0.8 09/26/2013 0903   CREATININE 0.80 08/17/2013 0346   CALCIUM 9.3 09/26/2013 0903   PROT 7.0 09/26/2013 0903   ALBUMIN 3.7 09/26/2013 0903   AST 26 09/26/2013 0903   ALT 30 09/26/2013 0903   ALKPHOS 69 09/26/2013 0903   BILITOT 0.54 09/26/2013 0903    I No results found for this basename: SPEP,  UPEP,   kappa and lambda light chains    Lab Results  Component Value Date   WBC 3.7* 11/28/2013   NEUTROABS 2.2 11/28/2013   HGB 13.0 11/28/2013   HCT 38.5 11/28/2013   MCV 89.1 11/28/2013   PLT 240 11/28/2013      Chemistry      Component Value Date/Time   NA 141 09/26/2013 0903   NA 144 08/17/2013 0346   K 3.9 09/26/2013 0903   K 3.8 08/17/2013 0346   CL 105 08/17/2013 0346   CO2 22 09/26/2013 0903   BUN 9.0 09/26/2013 0903   BUN 8 08/17/2013 0346   CREATININE 0.8 09/26/2013 0903   CREATININE 0.80 08/17/2013 0346      Component Value Date/Time   CALCIUM 9.3 09/26/2013 0903   ALKPHOS 69 09/26/2013 0903   AST 26 09/26/2013 0903   ALT 30 09/26/2013 0903   BILITOT 0.54 09/26/2013 0903       No results found for this basename: LABCA2    No components found with this basename: LABCA125    No results found for this basename: INR,  in the last 168 hours  Urinalysis    Component Value Date/Time   LABSPEC  1.010 12/13/2012 1435   GLUCOSEU Negative 12/13/2012 1435   UROBILINOGEN 0.2 12/13/2012 1435    STUDIES: No results found.  ASSESSMENT: 48 y.o. [place] woman  (1) status post right breast biopsy 08/06/2012 for a clinical mT2 N0, stage IIA invasive ductal carcinoma, estrogen receptor 3+ positive, progesterone receptor and HER-2 negative, Ki67 3+ [[id number]]  (2) treated neoadjuvant [place] dose dense cyclophosphamide and doxorubicin   x4, completed 10/25/2012, followed by a single dose of weekly paclitaxel, poorly tolerated; followed by 11 doses of Abraxane completed 02/14/2013  (3) status post right lumpectomy and sentinel lymph node sampling 03/10/2013 for a pT2 pN0, stage IIA invasive ductal carcinoma, grade 2, estrogen receptor 100% positive, progesterone receptor 21% positive, with an MIB-1 of 14%, and HER-2 amplified, the signals ratio being 2.52, the number per cell 2.90  (4) adjuvant radiation completed March 2015 (?)  (5) started tamoxifen March 2015  (6) started trastuzumab 05/23/2013; echocardiogram 09/15/2013 shows a normal ejection fraction   PLAN: Docie continues to tolerate treatment well. The labs were reviewed in detail and were stable. She will proceed with trastuzumab today, and will continue every 3 weeks through March 2016. Her next echocardiogram will be performed before her next dose on 10/23. Of course she will continue the tamoxifen daily as well.   Tynleigh will return in 3 weeks for her next infusion, and 6 weeks for her next office visit. She understands and agrees with this plan. She knows a goal of treatment in her case is cure. She has been encouraged to call with any issues that might arise before her next visit here.    Ferrell, Heather L, NP   11/28/2013 3:21 PM 

## 2013-11-28 NOTE — Progress Notes (Signed)
Patient refusing flu shot this season stating she's had a reaction to the vaccination in the past.

## 2013-11-28 NOTE — Telephone Encounter (Signed)
, °

## 2013-11-28 NOTE — Patient Instructions (Signed)
[facility] Discharge Instructions for Patients Receiving Chemotherapy  Today you received the following chemotherapy agents: Herceptin  To help prevent nausea and vomiting after your treatment, we encourage you to take your nausea medication as prescribed by your physician.    If you develop nausea and vomiting that is not controlled by your nausea medication, call the clinic.   BELOW ARE SYMPTOMS THAT SHOULD BE REPORTED IMMEDIATELY:  *FEVER GREATER THAN 100.5 F  *CHILLS WITH OR WITHOUT FEVER  NAUSEA AND VOMITING THAT IS NOT CONTROLLED WITH YOUR NAUSEA MEDICATION  *UNUSUAL SHORTNESS OF BREATH  *UNUSUAL BRUISING OR BLEEDING  TENDERNESS IN MOUTH AND THROAT WITH OR WITHOUT PRESENCE OF ULCERS  *URINARY PROBLEMS  *BOWEL PROBLEMS  UNUSUAL RASH Items with * indicate a potential emergency and should be followed up as soon as possible.  Feel free to call the clinic you have any questions or concerns. The clinic phone number is [phone].    

## 2013-11-28 NOTE — Telephone Encounter (Signed)
Per staff message and POF I have scheduled appts. Advised scheduler of appts. JMW Per staff message and POF I have scheduled appts. Advised scheduler of appts. JMW  

## 2013-12-01 NOTE — Telephone Encounter (Signed)
Encounter open in error 

## 2013-12-05 ENCOUNTER — Telehealth: Payer: Self-pay | Admitting: *Deleted

## 2013-12-05 NOTE — Telephone Encounter (Signed)
Per staff message and POF I have scheduled appts. Advised scheduler of appts. JMW  

## 2013-12-19 ENCOUNTER — Telehealth: Payer: Self-pay | Admitting: Nurse Practitioner

## 2013-12-19 ENCOUNTER — Ambulatory Visit (HOSPITAL_BASED_OUTPATIENT_CLINIC_OR_DEPARTMENT_OTHER): Payer: BC Managed Care – PPO

## 2013-12-19 ENCOUNTER — Ambulatory Visit: Payer: BC Managed Care – PPO | Admitting: Nurse Practitioner

## 2013-12-19 ENCOUNTER — Other Ambulatory Visit: Payer: Self-pay | Admitting: Hematology and Oncology

## 2013-12-19 ENCOUNTER — Ambulatory Visit (HOSPITAL_BASED_OUTPATIENT_CLINIC_OR_DEPARTMENT_OTHER): Payer: BC Managed Care – PPO | Admitting: Nurse Practitioner

## 2013-12-19 ENCOUNTER — Encounter: Payer: Self-pay | Admitting: Nurse Practitioner

## 2013-12-19 ENCOUNTER — Other Ambulatory Visit: Payer: Self-pay | Admitting: Oncology

## 2013-12-19 ENCOUNTER — Other Ambulatory Visit: Payer: BC Managed Care – PPO

## 2013-12-19 ENCOUNTER — Other Ambulatory Visit (HOSPITAL_BASED_OUTPATIENT_CLINIC_OR_DEPARTMENT_OTHER): Payer: BC Managed Care – PPO

## 2013-12-19 VITALS — BP 115/87 | HR 80 | Temp 98.1°F | Resp 18 | Ht 67.0 in | Wt 202.0 lb

## 2013-12-19 DIAGNOSIS — C50411 Malignant neoplasm of upper-outer quadrant of right female breast: Secondary | ICD-10-CM

## 2013-12-19 DIAGNOSIS — Z5112 Encounter for antineoplastic immunotherapy: Secondary | ICD-10-CM

## 2013-12-19 DIAGNOSIS — M2560 Stiffness of unspecified joint, not elsewhere classified: Secondary | ICD-10-CM

## 2013-12-19 DIAGNOSIS — R208 Other disturbances of skin sensation: Secondary | ICD-10-CM

## 2013-12-19 DIAGNOSIS — Z170 Estrogen receptor positive status [ER+]: Secondary | ICD-10-CM

## 2013-12-19 DIAGNOSIS — R200 Anesthesia of skin: Secondary | ICD-10-CM

## 2013-12-19 DIAGNOSIS — C50919 Malignant neoplasm of unspecified site of unspecified female breast: Secondary | ICD-10-CM

## 2013-12-19 LAB — CBC WITH DIFFERENTIAL/PLATELET
BASO%: 0.7 % (ref 0.0–2.0)
Basophils Absolute: 0 10*3/uL (ref 0.0–0.1)
EOS%: 4.2 % (ref 0.0–7.0)
Eosinophils Absolute: 0.2 10*3/uL (ref 0.0–0.5)
HCT: 37.5 % (ref 34.8–46.6)
HEMOGLOBIN: 12.6 g/dL (ref 11.6–15.9)
LYMPH#: 1 10*3/uL (ref 0.9–3.3)
LYMPH%: 24.1 % (ref 14.0–49.7)
MCH: 30.4 pg (ref 25.1–34.0)
MCHC: 33.6 g/dL (ref 31.5–36.0)
MCV: 90.4 fL (ref 79.5–101.0)
MONO#: 0.3 10*3/uL (ref 0.1–0.9)
MONO%: 7.2 % (ref 0.0–14.0)
NEUT#: 2.7 10*3/uL (ref 1.5–6.5)
NEUT%: 63.8 % (ref 38.4–76.8)
PLATELETS: 230 10*3/uL (ref 145–400)
RBC: 4.15 10*6/uL (ref 3.70–5.45)
RDW: 13 % (ref 11.2–14.5)
WBC: 4.3 10*3/uL (ref 3.9–10.3)

## 2013-12-19 MED ORDER — SODIUM CHLORIDE 0.9 % IV SOLN
Freq: Once | INTRAVENOUS | Status: AC
  Administered 2013-12-19: 14:00:00 via INTRAVENOUS

## 2013-12-19 MED ORDER — DIPHENHYDRAMINE HCL 25 MG PO CAPS
25.0000 mg | ORAL_CAPSULE | Freq: Once | ORAL | Status: AC
  Administered 2013-12-19: 25 mg via ORAL

## 2013-12-19 MED ORDER — SODIUM CHLORIDE 0.9 % IV SOLN
6.0000 mg/kg | Freq: Once | INTRAVENOUS | Status: AC
  Administered 2013-12-19: 567 mg via INTRAVENOUS
  Filled 2013-12-19: qty 27

## 2013-12-19 MED ORDER — DIPHENHYDRAMINE HCL 25 MG PO CAPS
ORAL_CAPSULE | ORAL | Status: AC
  Filled 2013-12-19: qty 1

## 2013-12-19 MED ORDER — DEXAMETHASONE SODIUM PHOSPHATE 10 MG/ML IJ SOLN
INTRAMUSCULAR | Status: AC
  Filled 2013-12-19: qty 1

## 2013-12-19 MED ORDER — DEXAMETHASONE SODIUM PHOSPHATE 10 MG/ML IJ SOLN
4.0000 mg | Freq: Once | INTRAMUSCULAR | Status: AC
  Administered 2013-12-19: 4 mg via INTRAVENOUS

## 2013-12-19 NOTE — Telephone Encounter (Signed)
CX appt with HF for 11/13. Gave new cal w/ new d/t for lab/tx

## 2013-12-19 NOTE — Progress Notes (Signed)
[facility]  [phone] [phone]     ID: Elizabeth Miles DOB: 01/13/1966  MR#: [id number]  LZA#:[id number]  Patient Care Team: Eldridge Abrahams, MD as PCP - General (Internal Medicine) Eldridge Abrahams, MD as Referring Physician (Internal Medicine) Emelia Loron, MD as Consulting Physician (General Surgery) Lowella Dell, MD as Consulting Physician (Oncology) Lurline Hare, MD as Consulting Physician (Radiation Oncology) Maximiano Coss, MD as Consulting Physician (Gynecology)   CHIEF COMPLAINT: right breat cancer  CURRENT TREATMENT: trastuzumab q 3 weeks, tamoxifen daily  BREAST CANCER HISTORY: From Dr. Feliz Beam earlier notes:  "Elizabeth Miles is a 48 y.o. female. Without significant past medical history who on June 2013 had a mammogram that was normal. But there was on physical exam possibility of a cyst noted in the right breast. The mammogram was negative. In 2014 June patient noted on exam another lump in the right breast. She underwent a diagnostic mammogram on June 10 that showed a right breast nodule in the outer quadrant. She had an ultrasound performed that showed at the 9:30 o'clock position a 3.6 cm area and then added 10:00 position 1.3 cm area with a total area being anywhere between 5-6 cm. The patient went on to have a right breast biopsy performed in [place]. The pathology revealed an invasive ductal carcinoma. This is been confirmed by our pathology as well. The carcinoma and papillary features and was felt to be between a grade 1 and 2. The tumor was estrogen receptor positive strongly (100%) progesterone receptor negative HER-2/neu negative with a Ki-67 that showed a high proliferation rate. Patient is now seen in medical oncology for discussion of treatment options."  Bilateral breast MRI on 08/27/2012 revealed in the right upper quadrant irregular lobulated mass with a satellite nodule or lobulation within 2 mm at its  superior aspect, measured together as 4.5 x 4.0 x 3.8 cm. There was extension of enhancement to the nipple, suggesting nipple involvement may be present. No lymphadenopathy was noted there was no any other area of abnormal enhancement in either breast (clinical stage IIA, T2 N0).  PET scan performed on 08/29/2012 revealed the primary breast cancer measuring 3.2 x 3.3 cm with SUV of 16. It was adjacent nodule along the superiomedial border of the primary mass measuring 1.4 cm which was also hypermetabolic. There were no additional areas of abnormal hypermetabolism. No abnormal hypermetabolic activity was seen in the chest, abdomen/pelvis,within the liver, pancreas, adrenal glands or spleen. No hypermetabolic lymph nodes. In the skeleton, no focal hypermetabolic activity to suggest skeletal metastasis was seen.  Completed neoadjuvant chemotherapy consisting of Q14 day Adriamycin/Cytoxan x 4 cycles on 10/25/2012, followed by one dose of neoadjuvant Taxol on 11/08/2012. She developed significant grade 2 neuropathy and Taxol was discontinued.  Then received neoadjuvant chemotherapy consisting of single agent Abraxane given on day 1, 8, 15 of each 28 day cycle. She completed therapy on 11/15/12 - 02/14/13.   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 Repeat HER-2 testing was now positive. The patient was started on tamoxifen March 2015 and on Herceptin the same month.  Her subsequent history is as detailed below.  INTERVAL HISTORY: Elizabeth Miles returns to clinic today for follow up of her breast cancer, accompanied by her mother and father. She continues with tamoxifen daily and trastuzumab every 3 weeks. She is tolerating  these treatments well, but has a new complaint. She is starting to have joint stiffness in her bilateral toes and left middle finger. She only  rates this pain as 2-3/10, mainly a "nuisance," but knows that that this is likely a side effect of the  Tamoxifen. She continues to have mild left facial numbness after her trastuzumab infusions, but this is greatly reduced with the use of zyrthec before treatment and nightly for a few days afterwards.   REVIEW OF SYSTEMS: Elizabeth Miles denies fevers, chills, nausea, vomiting, or changes in bowel or bladder habits. She has not shortness of breath, chest pain, palpitations, cough, or fatigue. A detailed review of systems is otherwise noncontributory.   PAST MEDICAL HISTORY: Past Medical History  Diagnosis Date  . 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 GERD (gastroesophageal reflux disease)   . GERD (gastroesophageal reflux disease) 08/22/2012  . Allergy   . Complication of anesthesia     1986 ; problem waking up  . Anxiety   . Wears glasses     PAST SURGICAL HISTORY: Past Surgical History  Procedure Laterality Date  . Dilation and curettage of uterus  1993  . Cervical ablation  1983  . Breast biopsy Right 08/06/12  . Popliteal synovial cyst excision  1970  . Portacath placement Left 09/12/2012    Procedure: INSERTION PORT-A-CATH;  Surgeon: Rolm Bookbinder, MD;  Location: [place];  Service: General;  Laterality: Left;  . Breast lumpectomy with needle localization and axillary sentinel lymph node bx Right 03/10/2013    Procedure: BREAST LUMPECTOMY WITH NEEDLE LOCALIZATION AND AXILLARY SENTINEL LYMPH NODE BIOPSY;  Surgeon: Rolm Bookbinder, MD;  Location: [place];  Service: General;  Laterality: Right;  . Port-a-cath removal Left 03/10/2013    Procedure: REMOVAL PORT-A-CATH;  Surgeon: Rolm Bookbinder, MD;  Location: [place];  Service: General;  Laterality: Left;    FAMILY HISTORY Family History  Problem Relation Age of Onset  . Hypertension Mother   . Bladder Cancer Maternal Uncle 68  . Cancer Paternal Grandmother 73    lung cancer  . Lung  cancer Paternal Grandfather     dx in his 41s  . COPD Paternal Uncle    the patient's parents are living and in good health. The patient has one brother, no sisters. There is no history of breast or ovarian cancer in the family to her knowledge  GYNECOLOGIC HISTORY:  Patient's last menstrual period was 03/12/2012. Menarche age 2, first live birth age 40. The patient is GX P3. 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 Her husband, Jori Moll, is a Tour manager. As daughter Lovena Le, 71, lives in [place] and daughter Caryl Pina just had a baby girl, the patient's first grandchild. Son Jori Moll "Carlynn Spry" is 91 and at home. The patient is a Psychologist, forensic.    ADVANCED DIRECTIVES:    HEALTH MAINTENANCE: History  Substance Use Topics  . Smoking status: Never Smoker   . Smokeless tobacco: Never Used  . Alcohol Use: No     Colonoscopy:  PAP:  Bone density:  Lipid panel:  Allergies  Allergen Reactions  . Darvocet [Propoxyphene N-Acetaminophen] Shortness Of Breath and Swelling  . Shellfish Allergy Shortness Of Breath and Swelling  . Other     CT dye, "chest hurt" "makes me feel cold"    Current Outpatient Prescriptions  Medication  Sig Dispense Refill  . cetirizine (ZYRTEC) 10 MG tablet Take 10 mg by mouth daily.      Marland Kitchen Dexlansoprazole (DEXILANT) 30 MG capsule Take 1 capsule (30 mg total) by mouth daily.  90 capsule  4  . ibuprofen (ADVIL,MOTRIN) 800 MG tablet Take 800 mg by mouth every 8 (eight) hours as needed for moderate pain (for back pain).      . tamoxifen (NOLVADEX) 20 MG tablet Take 1 tablet (20 mg total) by mouth daily.  90 tablet  3  . acetaminophen (TYLENOL) 500 MG tablet Take 1,000 mg by mouth every 6 (six) hours as needed.      . gabapentin (NEURONTIN) 100 MG capsule Take as needed for facial tingling, once daily  30 capsule  3    No current facility-administered medications for this visit.    OBJECTIVE: Middle-aged white woman in no acute distress Filed Vitals:   12/19/13 1323  BP: 115/87  Pulse: 80  Temp: 98.1 F (36.7 C)  Resp: 18     Body mass index is 31.63 kg/(m^2).    ECOG FS:1 - Symptomatic but completely ambulatory  Skin: warm, dry  HEENT: sclerae anicteric, conjunctivae pink, oropharynx clear. No thrush or mucositis.  Lymph Nodes: No cervical or supraclavicular lymphadenopathy  Lungs: clear to auscultation bilaterally, no rales, wheezes, or rhonci  Heart: regular rate and rhythm  Abdomen: round, soft, non tender, positive bowel sounds  Musculoskeletal: No focal spinal tenderness, no peripheral edema  Neuro: non focal, well oriented, positive affect  Breasts: deferred  LAB RESULTS:  CMP     Component Value Date/Time   NA 137 11/28/2013 1354   NA 144 08/17/2013 0346   K 4.8 11/28/2013 1354   K 3.8 08/17/2013 0346   CL 105 08/17/2013 0346   CO2 23 11/28/2013 1354   GLUCOSE 88 11/28/2013 1354   GLUCOSE 155* 08/17/2013 0346   BUN 11.0 11/28/2013 1354   BUN 8 08/17/2013 0346   CREATININE 0.8 11/28/2013 1354   CREATININE 0.80 08/17/2013 0346   CALCIUM 9.5 11/28/2013 1354   PROT 7.3 11/28/2013 1354   ALBUMIN 4.0 11/28/2013 1354   AST 27 11/28/2013 1354   ALT 30 11/28/2013 1354   ALKPHOS 61 11/28/2013 1354   BILITOT 0.51 11/28/2013 1354    I No results found for this basename: SPEP,  UPEP,   kappa and lambda light chains    Lab Results  Component Value Date   WBC 4.3 12/19/2013   NEUTROABS 2.7 12/19/2013   HGB 12.6 12/19/2013   HCT 37.5 12/19/2013   MCV 90.4 12/19/2013   PLT 230 12/19/2013      Chemistry      Component Value Date/Time   NA 137 11/28/2013 1354   NA 144 08/17/2013 0346   K 4.8 11/28/2013 1354   K 3.8 08/17/2013 0346   CL 105 08/17/2013 0346   CO2 23 11/28/2013 1354   BUN 11.0 11/28/2013 1354   BUN 8 08/17/2013 0346   CREATININE 0.8 11/28/2013 1354   CREATININE 0.80 08/17/2013  0346      Component Value Date/Time   CALCIUM 9.5 11/28/2013 1354   ALKPHOS 61 11/28/2013 1354   AST 27 11/28/2013 1354   ALT 30 11/28/2013 1354   BILITOT 0.51 11/28/2013 1354       No results found for this basename: LABCA2    No components found with this basename: LABCA125    No results found for this basename: INR,  in the last 168  hours  Urinalysis    Component Value Date/Time   LABSPEC 1.010 12/13/2012 1435   GLUCOSEU Negative 12/13/2012 1435   UROBILINOGEN 0.2 12/13/2012 1435    STUDIES: Most recent echocardiogram on 09/15/13 showed an ejection fraction of 60-65%  ASSESSMENT: 48 y.o. [facility] woman  (1) status post right breast biopsy 08/06/2012 for a clinical mT2 N0, stage IIA invasive ductal carcinoma, estrogen receptor 3+ positive, progesterone receptor and HER-2 negative, Ki67 3+ [[id number]]  (2) treated neoadjuvant [place] dose dense cyclophosphamide and doxorubicin x4, completed 10/25/2012, followed by a single dose of weekly paclitaxel, poorly tolerated; followed by 11 doses of Abraxane completed 02/14/2013  (3) status post right lumpectomy and sentinel lymph node sampling 03/10/2013 for a pT2 pN0, stage IIA invasive ductal carcinoma, grade 2, estrogen receptor 100% positive, progesterone receptor 21% positive, with an MIB-1 of 14%, and HER-2 amplified, the signals ratio being 2.52, the number per cell 2.90  (4) adjuvant radiation completed March 2015 (?)  (5) started tamoxifen March 2015  (6) started trastuzumab 05/23/2013; echocardiogram 09/15/2013 shows a normal ejection fraction   PLAN: Elizabeth Miles is doing well today. The CBC was reviewed in detail today and was entirely normal. She will proceed with trastuzumab today.   I advised her to take ibuprofen or naproxen for her joint stiffness as necessary. She will continue the tamoxifen daily, with the goal of 5 years of antiestrogen therapy.  Elizabeth Miles's next echocardiogram is scheduled for 12/23/13. She  will continue trastuzumab every 3 weeks. Her next office visit will be in late December. She understands and agrees with this plan. She knows the goal of treatment in her case is cure. She has been encouraged to call with any issues that might arise before her next visit here.   Elizabeth Duster, NP   12/19/2013 2:12 PM

## 2013-12-23 ENCOUNTER — Ambulatory Visit (HOSPITAL_COMMUNITY)
Admission: RE | Admit: 2013-12-23 | Discharge: 2013-12-23 | Disposition: A | Discharge status: Home / Self Care | Payer: BC Managed Care – PPO | Source: Ambulatory Visit | Attending: Internal Medicine | Admitting: Internal Medicine

## 2013-12-23 ENCOUNTER — Ambulatory Visit: Payer: BC Managed Care – PPO | Admitting: Neurology

## 2013-12-23 ENCOUNTER — Ambulatory Visit (HOSPITAL_BASED_OUTPATIENT_CLINIC_OR_DEPARTMENT_OTHER)
Admission: RE | Admit: 2013-12-23 | Discharge: 2013-12-23 | Disposition: A | Discharge status: Home / Self Care | Payer: BC Managed Care – PPO | Source: Ambulatory Visit | Attending: Internal Medicine | Admitting: Internal Medicine

## 2013-12-23 VITALS — BP 116/72 | HR 69 | Wt 200.2 lb

## 2013-12-23 DIAGNOSIS — K219 Gastro-esophageal reflux disease without esophagitis: Secondary | ICD-10-CM | POA: Diagnosis not present

## 2013-12-23 DIAGNOSIS — R9431 Abnormal electrocardiogram [ECG] [EKG]: Secondary | ICD-10-CM | POA: Diagnosis not present

## 2013-12-23 DIAGNOSIS — C50911 Malignant neoplasm of unspecified site of right female breast: Secondary | ICD-10-CM | POA: Diagnosis present

## 2013-12-23 DIAGNOSIS — C50411 Malignant neoplasm of upper-outer quadrant of right female breast: Secondary | ICD-10-CM

## 2013-12-23 DIAGNOSIS — Z170 Estrogen receptor positive status [ER+]: Secondary | ICD-10-CM | POA: Diagnosis not present

## 2013-12-23 DIAGNOSIS — Z5111 Encounter for antineoplastic chemotherapy: Secondary | ICD-10-CM

## 2013-12-23 DIAGNOSIS — Z9221 Personal history of antineoplastic chemotherapy: Secondary | ICD-10-CM | POA: Insufficient documentation

## 2013-12-23 NOTE — Progress Notes (Signed)
Patient ID: Elizabeth Miles, female   DOB: [birth year], 48 y.o.   MRN: [id number] Referring Physician: Magrinat Primary Care: Elizabeth Miles  HPI:  Elizabeth Miles is a 48 y/o woman with h/o GERD and R breast CA referred by Elizabeth Miles for enrollment into the cardio-oncology Clinic.  Diagnosed in 6/14 with R breast CA  (clinical stage IIA, T2 N0). Triple positive.  Completed neoadjuvant chemotherapy consisting of Q14 day Adriamycin/Cytoxan x 4 cycles on 10/25/2012. Received one dose of neoadjuvant chemotherapy with Taxol on 11/08/2012. She developed significant grade 2 neuropathy and Taxol was discontinued. s/p neoadjuvant chemotherapy consisting of single agent Abraxane given on day 1, 8, 15 of each 28 day cycle. She completed therapy on 11/15/12 - 02/14/13. Status post right breast lumpectomy with sentinel lymph node biopsy on 03/10/13 (sentinel node negative).  Currently getting Herceptin every 3 wks until 3/16.   Denies h/o known cardiac disease. Had echo and stress test with Elizabeth Miles which were normal. She had an ECG and was told she had an abnormal T wave.   Doing well today.  No exertional dyspnea, no chest pain.  Continues to have some tingling on the left side of her face.   Echo 04/24/23  EF 60%. Lateral s' 8.9 cm GLS - 18.7 Echo 5/15 EF 23%, normal diastolic function, lateral s' 9.9, GLS -20.7% Echo 7/15 EF 60-65%, lateral s' 10.6, GLS -21.7%.  Echo 10/15 EF 60%, normal RV size and systolic function, normal diastolic function, lateral s' 11.9, GLS -20.1%.   Review of Systems: All systems reviewed and negative except as per HPI.  Past Medical History  Diagnosis Date  . 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 GERD (gastroesophageal reflux disease)   . GERD (gastroesophageal reflux disease) 08/22/2012  . Allergy   . Complication of anesthesia     1986 ; problem waking up  . Anxiety   . Wears glasses     Current Outpatient Prescriptions  Medication Sig Dispense Refill  .  acetaminophen (TYLENOL) 500 MG tablet Take 1,000 mg by mouth every 6 (six) hours as needed.      . cetirizine (ZYRTEC) 10 MG tablet Take 10 mg by mouth daily.      Marland Kitchen Dexlansoprazole (DEXILANT) 30 MG capsule Take 1 capsule (30 mg total) by mouth daily.  90 capsule  4  . gabapentin (NEURONTIN) 100 MG capsule Take as needed for facial tingling, once daily  30 capsule  3  . ibuprofen (ADVIL,MOTRIN) 800 MG tablet Take 800 mg by mouth every 8 (eight) hours as needed for moderate pain (for back pain).      . tamoxifen (NOLVADEX) 20 MG tablet Take 1 tablet (20 mg total) by mouth daily.  90 tablet  3   No current facility-administered medications for this encounter.    Allergies  Allergen Reactions  . Darvocet [Propoxyphene N-Acetaminophen] Shortness Of Breath and Swelling  . Shellfish Allergy Shortness Of Breath and Swelling  . Other     CT dye, "chest hurt" "makes me feel cold"    History   Social History  . Marital Status: Married    Spouse Name: N/A    Number of Children: 3  . Years of Education: N/A   Occupational History  . Not on file.   Social History Main Topics  . Smoking status: Never Smoker   . Smokeless tobacco: Never Used  . Alcohol Use: No  . Drug Use: No  . Sexual Activity: Yes  Comment: peri menopausal   Other Topics Concern  . Not on file   Social History Narrative  . No narrative on file    Family History  Problem Relation Age of Onset  . Hypertension Mother   . Bladder Cancer Maternal Uncle 68  . Cancer Paternal Grandmother 44    lung cancer  . Lung cancer Paternal Grandfather     dx in his 20s  . COPD Paternal Uncle     PHYSICAL EXAM: Filed Vitals:   12/23/13 0853  BP: 116/72  Pulse: 69  Weight: 200 lb 4 oz (90.833 kg)  SpO2: 99%   General:  Well appearing. No respiratory difficulty HEENT: normal Neck: supple. no JVD. Carotids 2+ bilat; no bruits. No lymphadenopathy or thryomegaly appreciated. Cor: PMI nondisplaced. Regular rate &  rhythm. No rubs, gallops or murmurs. Lungs: clear Abdomen: soft, nontender, nondistended. No hepatosplenomegaly. No bruits or masses. Good bowel sounds. Extremities: no cyanosis, clubbing, rash.  Trace ankle edema. Neuro: alert & oriented x 3, cranial nerves grossly intact. moves all 4 extremities w/o difficulty. Affect pleasant.  ASSESSMENT & PLAN: 1. Breast CA: I reviewed her echo today.  The echo was a normal study, all indices were stable.  She can continue Herceptin as planned.  Will get repeat echo and office visit in 3 months.  2. Abnormal ECG: Nonspecific abnormalities on ECG at prior appointment. Echo ok. No further w/u needed.  Elizabeth Mangual,MD 12/23/2013

## 2013-12-23 NOTE — Patient Instructions (Signed)
We will contact you in 3 months to schedule your next appointment and echocardiogram  

## 2013-12-23 NOTE — Progress Notes (Signed)
  Echocardiogram 2D Echocardiogram has been performed.  Elizabeth Miles M 12/23/2013, 8:36 AM

## 2014-01-05 ENCOUNTER — Other Ambulatory Visit: Payer: Self-pay | Admitting: Oncology

## 2014-01-09 ENCOUNTER — Ambulatory Visit: Payer: BC Managed Care – PPO | Admitting: Nurse Practitioner

## 2014-01-09 ENCOUNTER — Other Ambulatory Visit (HOSPITAL_BASED_OUTPATIENT_CLINIC_OR_DEPARTMENT_OTHER): Payer: BC Managed Care – PPO

## 2014-01-09 ENCOUNTER — Ambulatory Visit (HOSPITAL_BASED_OUTPATIENT_CLINIC_OR_DEPARTMENT_OTHER): Payer: BC Managed Care – PPO

## 2014-01-09 DIAGNOSIS — C50411 Malignant neoplasm of upper-outer quadrant of right female breast: Secondary | ICD-10-CM

## 2014-01-09 DIAGNOSIS — C50919 Malignant neoplasm of unspecified site of unspecified female breast: Secondary | ICD-10-CM

## 2014-01-09 DIAGNOSIS — Z5112 Encounter for antineoplastic immunotherapy: Secondary | ICD-10-CM

## 2014-01-09 DIAGNOSIS — C773 Secondary and unspecified malignant neoplasm of axilla and upper limb lymph nodes: Secondary | ICD-10-CM

## 2014-01-09 LAB — CBC WITH DIFFERENTIAL/PLATELET
BASO%: 1 % (ref 0.0–2.0)
Basophils Absolute: 0 10*3/uL (ref 0.0–0.1)
EOS ABS: 0.1 10*3/uL (ref 0.0–0.5)
EOS%: 2.4 % (ref 0.0–7.0)
HEMATOCRIT: 37.5 % (ref 34.8–46.6)
HEMOGLOBIN: 12.5 g/dL (ref 11.6–15.9)
LYMPH#: 1.2 10*3/uL (ref 0.9–3.3)
LYMPH%: 29 % (ref 14.0–49.7)
MCH: 30 pg (ref 25.1–34.0)
MCHC: 33.3 g/dL (ref 31.5–36.0)
MCV: 90.1 fL (ref 79.5–101.0)
MONO#: 0.4 10*3/uL (ref 0.1–0.9)
MONO%: 8.8 % (ref 0.0–14.0)
NEUT#: 2.4 10*3/uL (ref 1.5–6.5)
NEUT%: 58.8 % (ref 38.4–76.8)
Platelets: 219 10*3/uL (ref 145–400)
RBC: 4.16 10*6/uL (ref 3.70–5.45)
RDW: 12.8 % (ref 11.2–14.5)
WBC: 4.1 10*3/uL (ref 3.9–10.3)

## 2014-01-09 LAB — COMPREHENSIVE METABOLIC PANEL (CC13)
ALT: 23 U/L (ref 0–55)
ANION GAP: 9 meq/L (ref 3–11)
AST: 24 U/L (ref 5–34)
Albumin: 3.9 g/dL (ref 3.5–5.0)
Alkaline Phosphatase: 63 U/L (ref 40–150)
BUN: 11.8 mg/dL (ref 7.0–26.0)
CHLORIDE: 107 meq/L (ref 98–109)
CO2: 23 mEq/L (ref 22–29)
CREATININE: 0.7 mg/dL (ref 0.6–1.1)
Calcium: 9.3 mg/dL (ref 8.4–10.4)
Glucose: 94 mg/dl (ref 70–140)
Potassium: 3.9 mEq/L (ref 3.5–5.1)
Sodium: 139 mEq/L (ref 136–145)
Total Bilirubin: 0.59 mg/dL (ref 0.20–1.20)
Total Protein: 6.8 g/dL (ref 6.4–8.3)

## 2014-01-09 MED ORDER — DIPHENHYDRAMINE HCL 25 MG PO CAPS
ORAL_CAPSULE | ORAL | Status: AC
  Filled 2014-01-09: qty 1

## 2014-01-09 MED ORDER — DEXAMETHASONE SODIUM PHOSPHATE 10 MG/ML IJ SOLN
4.0000 mg | Freq: Once | INTRAMUSCULAR | Status: AC
  Administered 2014-01-09: 4 mg via INTRAVENOUS

## 2014-01-09 MED ORDER — DEXAMETHASONE SODIUM PHOSPHATE 10 MG/ML IJ SOLN
INTRAMUSCULAR | Status: AC
  Filled 2014-01-09: qty 1

## 2014-01-09 MED ORDER — TRASTUZUMAB CHEMO INJECTION 440 MG
6.0000 mg/kg | Freq: Once | INTRAVENOUS | Status: AC
  Administered 2014-01-09: 567 mg via INTRAVENOUS
  Filled 2014-01-09: qty 27

## 2014-01-09 MED ORDER — SODIUM CHLORIDE 0.9 % IV SOLN
Freq: Once | INTRAVENOUS | Status: AC
  Administered 2014-01-09: 15:00:00 via INTRAVENOUS

## 2014-01-09 MED ORDER — DIPHENHYDRAMINE HCL 25 MG PO CAPS
25.0000 mg | ORAL_CAPSULE | Freq: Once | ORAL | Status: AC
  Administered 2014-01-09: 25 mg via ORAL

## 2014-01-09 NOTE — Progress Notes (Signed)
Discharged at 1755 with mom, ambulatory in no distress.  Denies questions or concerns.

## 2014-01-09 NOTE — Patient Instructions (Addendum)
[facility] Discharge Instructions for Patients Receiving Chemotherapy  Today you received the following chemotherapy agents herceptin.    To help prevent nausea and vomiting after your treatment, we encourage you to take your nausea medication as directed.  Tylenol or benadryl if needed for flu-like symptoms.   If you develop nausea and vomiting that is not controlled by your nausea medication, call the clinic.   BELOW ARE SYMPTOMS THAT SHOULD BE REPORTED IMMEDIATELY:  *FEVER GREATER THAN 100.5 F  *CHILLS WITH OR WITHOUT FEVER  NAUSEA AND VOMITING THAT IS NOT CONTROLLED WITH YOUR NAUSEA MEDICATION  *UNUSUAL SHORTNESS OF BREATH  *UNUSUAL BRUISING OR BLEEDING  TENDERNESS IN MOUTH AND THROAT WITH OR WITHOUT PRESENCE OF ULCERS  *URINARY PROBLEMS  *BOWEL PROBLEMS  UNUSUAL RASH Items with * indicate a potential emergency and should be followed up as soon as possible.  Feel free to call the clinic you have any questions or concerns. The clinic phone number is [phone].

## 2014-01-29 ENCOUNTER — Telehealth: Payer: Self-pay | Admitting: Emergency Medicine

## 2014-01-29 ENCOUNTER — Telehealth: Payer: Self-pay | Admitting: Oncology

## 2014-01-29 NOTE — Telephone Encounter (Signed)
pt cld & left a vm wanting earlier appt for 12/4-adv no openings for 12/4-pt understood and asked to [place] [id number]

## 2014-01-29 NOTE — Telephone Encounter (Signed)
Patient called with complaints of "ringing in ears' and 'pain in ears'. States this has been ongoing for a while now and is getting worse. States is feels similar to when you have water stuck in your ears.   Scheduled patient to see Josetta Huddle NP on 12/4 prior to her Herceptin treatment.  Left message on patient's home voicemail with this appointment.

## 2014-01-30 ENCOUNTER — Other Ambulatory Visit (HOSPITAL_BASED_OUTPATIENT_CLINIC_OR_DEPARTMENT_OTHER): Payer: BC Managed Care – PPO

## 2014-01-30 ENCOUNTER — Ambulatory Visit (HOSPITAL_BASED_OUTPATIENT_CLINIC_OR_DEPARTMENT_OTHER): Payer: BC Managed Care – PPO | Admitting: Oncology

## 2014-01-30 ENCOUNTER — Ambulatory Visit (HOSPITAL_BASED_OUTPATIENT_CLINIC_OR_DEPARTMENT_OTHER): Payer: BC Managed Care – PPO

## 2014-01-30 ENCOUNTER — Other Ambulatory Visit: Payer: BC Managed Care – PPO

## 2014-01-30 VITALS — BP 123/75 | HR 88 | Temp 98.0°F | Resp 18 | Ht 67.0 in | Wt 206.3 lb

## 2014-01-30 DIAGNOSIS — C50411 Malignant neoplasm of upper-outer quadrant of right female breast: Secondary | ICD-10-CM

## 2014-01-30 DIAGNOSIS — Z170 Estrogen receptor positive status [ER+]: Secondary | ICD-10-CM

## 2014-01-30 DIAGNOSIS — C50919 Malignant neoplasm of unspecified site of unspecified female breast: Secondary | ICD-10-CM

## 2014-01-30 DIAGNOSIS — Z5112 Encounter for antineoplastic immunotherapy: Secondary | ICD-10-CM

## 2014-01-30 LAB — CBC WITH DIFFERENTIAL/PLATELET
BASO%: 0.6 % (ref 0.0–2.0)
Basophils Absolute: 0 10*3/uL (ref 0.0–0.1)
EOS%: 2.6 % (ref 0.0–7.0)
Eosinophils Absolute: 0.1 10*3/uL (ref 0.0–0.5)
HCT: 41 % (ref 34.8–46.6)
HEMOGLOBIN: 13.6 g/dL (ref 11.6–15.9)
LYMPH#: 1.1 10*3/uL (ref 0.9–3.3)
LYMPH%: 22.4 % (ref 14.0–49.7)
MCH: 30 pg (ref 25.1–34.0)
MCHC: 33.2 g/dL (ref 31.5–36.0)
MCV: 90.5 fL (ref 79.5–101.0)
MONO#: 0.4 10*3/uL (ref 0.1–0.9)
MONO%: 8.8 % (ref 0.0–14.0)
NEUT#: 3.2 10*3/uL (ref 1.5–6.5)
NEUT%: 65.6 % (ref 38.4–76.8)
Platelets: 252 10*3/uL (ref 145–400)
RBC: 4.53 10*6/uL (ref 3.70–5.45)
RDW: 12.9 % (ref 11.2–14.5)
WBC: 4.9 10*3/uL (ref 3.9–10.3)
nRBC: 0 % (ref 0–0)

## 2014-01-30 MED ORDER — TRASTUZUMAB CHEMO INJECTION 440 MG
6.0000 mg/kg | Freq: Once | INTRAVENOUS | Status: AC
  Administered 2014-01-30: 567 mg via INTRAVENOUS
  Filled 2014-01-30: qty 27

## 2014-01-30 MED ORDER — DEXAMETHASONE SODIUM PHOSPHATE 10 MG/ML IJ SOLN
4.0000 mg | Freq: Once | INTRAMUSCULAR | Status: AC
  Administered 2014-01-30: 4 mg via INTRAVENOUS

## 2014-01-30 MED ORDER — DIPHENHYDRAMINE HCL 25 MG PO CAPS
25.0000 mg | ORAL_CAPSULE | Freq: Once | ORAL | Status: AC
  Administered 2014-01-30: 25 mg via ORAL

## 2014-01-30 MED ORDER — SODIUM CHLORIDE 0.9 % IV SOLN
Freq: Once | INTRAVENOUS | Status: AC
  Administered 2014-01-30: 15:00:00 via INTRAVENOUS

## 2014-01-30 NOTE — Patient Instructions (Signed)
[facility] Discharge Instructions for Patients Receiving Chemotherapy  Today you received the following chemotherapy agents Herceptin.  To help prevent nausea and vomiting after your treatment, we encourage you to take your nausea medication as prescribed.   If you develop nausea and vomiting that is not controlled by your nausea medication, call the clinic.   BELOW ARE SYMPTOMS THAT SHOULD BE REPORTED IMMEDIATELY:  *FEVER GREATER THAN 100.5 F  *CHILLS WITH OR WITHOUT FEVER  NAUSEA AND VOMITING THAT IS NOT CONTROLLED WITH YOUR NAUSEA MEDICATION  *UNUSUAL SHORTNESS OF BREATH  *UNUSUAL BRUISING OR BLEEDING  TENDERNESS IN MOUTH AND THROAT WITH OR WITHOUT PRESENCE OF ULCERS  *URINARY PROBLEMS  *BOWEL PROBLEMS  UNUSUAL RASH Items with * indicate a potential emergency and should be followed up as soon as possible.  Feel free to call the clinic you have any questions or concerns. The clinic phone number is [phone].    

## 2014-01-30 NOTE — Progress Notes (Signed)
[place]  [phone]) [phone] [phone]) [phone]     ID: Elizabeth Miles DOB: Jun 11, 1965  MR#: [id number]  ION#:[id number]  Patient Care Team: Clinton Quant, MD as PCP - General (Internal Medicine) Clinton Quant, MD as Referring Physician (Internal Medicine) Rolm Bookbinder, MD as Consulting Physician (General Surgery) Chauncey Cruel, MD as Consulting Physician (Oncology) Thea Silversmith, MD as Consulting Physician (Radiation Oncology) San Morelle, MD as Consulting Physician (Gynecology)   CHIEF COMPLAINT: right breat cancer  CURRENT TREATMENT: trastuzumab q 3 weeks, tamoxifen daily  BREAST CANCER HISTORY: From Dr. Dana Allan earlier notes:  "Elizabeth Miles is a 48 y.o. female. Without significant past medical history who on June 2013 had a mammogram that was normal. But there was on physical exam possibility of a cyst noted in the right breast. The mammogram was negative. In 2014 June patient noted on exam another lump in the right breast. She underwent a diagnostic mammogram on June 10 that showed a right breast nodule in the outer quadrant. She had an ultrasound performed that showed at the 9:30 o'clock position a 3.6 cm area and then added 10:00 position 1.3 cm area with a total area being anywhere between 5-6 cm. The patient went on to have a right breast biopsy performed in [place]. The pathology revealed an invasive ductal carcinoma. This is been confirmed by our pathology as well. The carcinoma and papillary features and was felt to be between a grade 1 and 2. The tumor was estrogen receptor positive strongly (100%) progesterone receptor negative HER-2/neu negative with a Ki-67 that showed a high proliferation rate. Patient is now seen in medical oncology for discussion of treatment options."  Bilateral breast MRI on 08/27/2012 revealed in the right upper quadrant irregular lobulated mass with a satellite nodule or lobulation within 2 mm at its  superior aspect, measured together as 4.5 x 4.0 x 3.8 cm. There was extension of enhancement to the nipple, suggesting nipple involvement may be present. No lymphadenopathy was noted there was no any other area of abnormal enhancement in either breast (clinical stage IIA, T2 N0).  PET scan performed on 08/29/2012 revealed the primary breast cancer measuring 3.2 x 3.3 cm with SUV of 16. It was adjacent nodule along the superiomedial border of the primary mass measuring 1.4 cm which was also hypermetabolic. There were no additional areas of abnormal hypermetabolism. No abnormal hypermetabolic activity was seen in the chest, abdomen/pelvis,within the liver, pancreas, adrenal glands or spleen. No hypermetabolic lymph nodes. In the skeleton, no focal hypermetabolic activity to suggest skeletal metastasis was seen.  Completed neoadjuvant chemotherapy consisting of Q14 day Adriamycin/Cytoxan x 4 cycles on 10/25/2012, followed by one dose of neoadjuvant Taxol on 11/08/2012. She developed significant grade 2 neuropathy and Taxol was discontinued.  Then received neoadjuvant chemotherapy consisting of single agent Abraxane given on day 1, 8, 15 of each 28 day cycle. She completed therapy on 11/15/12 - 02/14/13.   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 Repeat HER-2 testing was now positive. The patient was started on tamoxifen March 2015 and on Herceptin the same month.  Her subsequent history is as detailed below.  INTERVAL HISTORY: Elizabeth Miles returns to clinic today for follow up of her breast cancer. She continues with tamoxifen daily and trastuzumab every 3 weeks. She is tolerating these treatments well. She continues to  have mild left facial numbness after her trastuzumab infusions, but this is greatly reduced with the use of zyrtec before treatment and  nightly for a few days afterwards. She has been prescribed gabapentin by Neurology for this issue previously, but has not taken it. Has noticed more fullness in her ears. No hearing loss. Admits to being depressed. Has taken a number of antidepressants in the past; none of which she could tolerate. Sees a counselor regularly who has recommended that she psychiatry for medication recommendations.   REVIEW OF SYSTEMS: Elizabeth Miles denies fevers, chills, nausea, vomiting, or changes in bowel or bladder habits. She has not shortness of breath, chest pain, palpitations, cough, or fatigue. A detailed review of systems is otherwise noncontributory.   PAST MEDICAL HISTORY: Past Medical History  Diagnosis Date  . 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 GERD (gastroesophageal reflux disease)   . GERD (gastroesophageal reflux disease) 08/22/2012  . Allergy   . Complication of anesthesia     1986 ; problem waking up  . Anxiety   . Wears glasses     PAST SURGICAL HISTORY: Past Surgical History  Procedure Laterality Date  . Dilation and curettage of uterus  1993  . Cervical ablation  1983  . Breast biopsy Right 08/06/12  . Popliteal synovial cyst excision  1970  . Portacath placement Left 09/12/2012    Procedure: INSERTION PORT-A-CATH;  Surgeon: Rolm Bookbinder, MD;  Location: [facility];  Service: General;  Laterality: Left;  . Breast lumpectomy with needle localization and axillary sentinel lymph node bx Right 03/10/2013    Procedure: BREAST LUMPECTOMY WITH NEEDLE LOCALIZATION AND AXILLARY SENTINEL LYMPH NODE BIOPSY;  Surgeon: Rolm Bookbinder, MD;  Location: [facility];  Service: General;  Laterality: Right;  . Port-a-cath removal Left 03/10/2013    Procedure: REMOVAL PORT-A-CATH;  Surgeon: Rolm Bookbinder, MD;  Location: [facility];  Service: General;  Laterality: Left;    FAMILY HISTORY Family History  Problem Relation Age of Onset  . Hypertension Mother     . Bladder Cancer Maternal Uncle 68  . Cancer Paternal Grandmother 45    lung cancer  . Lung cancer Paternal Grandfather     dx in his 85s  . COPD Paternal Uncle    the patient's parents are living and in good health. The patient has one brother, no sisters. There is no history of breast or ovarian cancer in the family to her knowledge  GYNECOLOGIC HISTORY:  Patient's last menstrual period was 03/12/2012. Menarche age 74, first live birth age 59. The patient is GX P3. 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 Her husband, Jori Moll, is a Tour manager. As daughter Elizabeth Miles, 76, lives in [place] and daughter Elizabeth Miles just had a baby girl, the patient's first grandchild. Son Jori Moll "Carlynn Spry" is 90 and at home. The patient is a Psychologist, forensic.    ADVANCED DIRECTIVES:    HEALTH MAINTENANCE: History  Substance Use Topics  . Smoking status: Never Smoker   . Smokeless tobacco: Never Used  . Alcohol Use: No     Colonoscopy:  PAP:  Bone density:  Lipid panel:  Allergies  Allergen Reactions  . Darvocet [Propoxyphene N-Acetaminophen] Shortness Of Breath and Swelling  . Shellfish Allergy Shortness Of Breath and Swelling  . Other     CT dye, "chest hurt" "makes me feel  cold"    Current Outpatient Prescriptions  Medication Sig Dispense Refill  . acetaminophen (TYLENOL) 500 MG tablet Take 1,000 mg by mouth every 6 (six) hours as needed.    . cetirizine (ZYRTEC) 10 MG tablet Take 10 mg by mouth daily.    Marland Kitchen Dexlansoprazole (DEXILANT) 30 MG capsule Take 1 capsule (30 mg total) by mouth daily. 90 capsule 4  . gabapentin (NEURONTIN) 100 MG capsule Take as needed for facial tingling, once daily 30 capsule 3  . ibuprofen (ADVIL,MOTRIN) 800 MG tablet Take 800 mg by mouth every 8 (eight) hours as needed for moderate pain (for back pain).    . tamoxifen  (NOLVADEX) 20 MG tablet Take 1 tablet (20 mg total) by mouth daily. 90 tablet 3   No current facility-administered medications for this visit.   Facility-Administered Medications Ordered in Other Visits  Medication Dose Route Frequency Provider Last Rate Last Dose  . trastuzumab (HERCEPTIN) 567 mg in sodium chloride 0.9 % 250 mL chemo infusion  6 mg/kg (Treatment Plan Actual) Intravenous Once Chauncey Cruel, MD        OBJECTIVE: Middle-aged white woman in no acute distress Filed Vitals:   01/30/14 1415  BP: 123/75  Pulse: 88  Temp: 98 F (36.7 C)  Resp: 18     Body mass index is 32.3 kg/(m^2).    ECOG FS:1 - Symptomatic but completely ambulatory  Skin: warm, dry  HEENT: sclerae anicteric, conjunctivae pink, oropharynx clear. No thrush or mucositis.  Lymph Nodes: No cervical or supraclavicular lymphadenopathy  Lungs: clear to auscultation bilaterally, no rales, wheezes, or rhonci  Heart: regular rate and rhythm  Abdomen: round, soft, non tender, positive bowel sounds  Musculoskeletal: No focal spinal tenderness, no peripheral edema  Neuro: non focal, well oriented, positive affect  Breasts: deferred  LAB RESULTS:  CMP     Component Value Date/Time   NA 139 01/09/2014 1419   NA 144 08/17/2013 0346   K 3.9 01/09/2014 1419   K 3.8 08/17/2013 0346   CL 105 08/17/2013 0346   CO2 23 01/09/2014 1419   GLUCOSE 94 01/09/2014 1419   GLUCOSE 155* 08/17/2013 0346   BUN 11.8 01/09/2014 1419   BUN 8 08/17/2013 0346   CREATININE 0.7 01/09/2014 1419   CREATININE 0.80 08/17/2013 0346   CALCIUM 9.3 01/09/2014 1419   PROT 6.8 01/09/2014 1419   ALBUMIN 3.9 01/09/2014 1419   AST 24 01/09/2014 1419   ALT 23 01/09/2014 1419   ALKPHOS 63 01/09/2014 1419   BILITOT 0.59 01/09/2014 1419    I No results found for: SPEP  Lab Results  Component Value Date   WBC 4.9 01/30/2014   NEUTROABS 3.2 01/30/2014   HGB 13.6 01/30/2014   HCT 41.0 01/30/2014   MCV 90.5 01/30/2014   PLT 252  01/30/2014      Chemistry      Component Value Date/Time   NA 139 01/09/2014 1419   NA 144 08/17/2013 0346   K 3.9 01/09/2014 1419   K 3.8 08/17/2013 0346   CL 105 08/17/2013 0346   CO2 23 01/09/2014 1419   BUN 11.8 01/09/2014 1419   BUN 8 08/17/2013 0346   CREATININE 0.7 01/09/2014 1419   CREATININE 0.80 08/17/2013 0346      Component Value Date/Time   CALCIUM 9.3 01/09/2014 1419   ALKPHOS 63 01/09/2014 1419   AST 24 01/09/2014 1419   ALT 23 01/09/2014 1419   BILITOT 0.59 01/09/2014 1419  No results found for: LABCA2  No components found for: OVANV916  No results for input(s): INR in the last 168 hours.  Urinalysis    Component Value Date/Time   LABSPEC 1.010 12/13/2012 1435   GLUCOSEU Negative 12/13/2012 1435   UROBILINOGEN 0.2 12/13/2012 1435    STUDIES: Most recent echocardiogram on 09/15/13 showed an ejection fraction of 60-65%  ASSESSMENT: 48 y.o. [place] woman  (1) status post right breast biopsy 08/06/2012 for a clinical mT2 N0, stage IIA invasive ductal carcinoma, estrogen receptor 3+ positive, progesterone receptor and HER-2 negative, Ki67 3+ [[id number]]  (2) treated neoadjuvant [place] dose dense cyclophosphamide and doxorubicin x4, completed 10/25/2012, followed by a single dose of weekly paclitaxel, poorly tolerated; followed by 11 doses of Abraxane completed 02/14/2013  (3) status post right lumpectomy and sentinel lymph node sampling 03/10/2013 for a pT2 pN0, stage IIA invasive ductal carcinoma, grade 2, estrogen receptor 100% positive, progesterone receptor 21% positive, with an MIB-1 of 14%, and HER-2 amplified, the signals ratio being 2.52, the number per cell 2.90  (4) adjuvant radiation completed March 2015 (?)  (5) started tamoxifen March 2015  (6) started trastuzumab 05/23/2013; echocardiogram 12/23/13 shows a normal ejection fraction   PLAN: Evanny has ongoing issues with left facial numbness. I am not clear that this  is due to her trastuzumab. Zyrtec makes it better. I have recommended that she try the gabapentin 100 mg daily as prescribed by Neurology. She is hesitant to do so, but will think about it. The CBC was reviewed in detail today and was entirely normal. She will proceed with trastuzumab today.   We have discussed her depression in great detail today. I concur with her counselor and think that medications need to be prescribed by psychiatry given her poor tolerance of these medications in the past. We talked about stress reduction techniques. She is participating in massage and support groups closer to her home.  She will continue trastuzumab every 3 weeks. Her next office visit will be in late December. She understands and agrees with this plan. She knows the goal of treatment in her case is cure. She has been encouraged to call with any issues that might arise before her next visit here.   Mikey Bussing, NP   01/30/2014 3:56 PM

## 2014-02-23 ENCOUNTER — Telehealth: Payer: Self-pay | Admitting: Nurse Practitioner

## 2014-02-23 ENCOUNTER — Ambulatory Visit (HOSPITAL_BASED_OUTPATIENT_CLINIC_OR_DEPARTMENT_OTHER): Payer: BC Managed Care – PPO | Admitting: Nurse Practitioner

## 2014-02-23 ENCOUNTER — Ambulatory Visit (HOSPITAL_BASED_OUTPATIENT_CLINIC_OR_DEPARTMENT_OTHER): Payer: BC Managed Care – PPO

## 2014-02-23 ENCOUNTER — Telehealth: Payer: Self-pay | Admitting: *Deleted

## 2014-02-23 ENCOUNTER — Other Ambulatory Visit (HOSPITAL_BASED_OUTPATIENT_CLINIC_OR_DEPARTMENT_OTHER): Payer: BC Managed Care – PPO

## 2014-02-23 VITALS — BP 128/80 | HR 78 | Temp 98.4°F | Resp 18 | Ht 67.0 in | Wt 208.1 lb

## 2014-02-23 DIAGNOSIS — C50919 Malignant neoplasm of unspecified site of unspecified female breast: Secondary | ICD-10-CM

## 2014-02-23 DIAGNOSIS — C50411 Malignant neoplasm of upper-outer quadrant of right female breast: Secondary | ICD-10-CM

## 2014-02-23 DIAGNOSIS — Z170 Estrogen receptor positive status [ER+]: Secondary | ICD-10-CM

## 2014-02-23 DIAGNOSIS — H6591 Unspecified nonsuppurative otitis media, right ear: Secondary | ICD-10-CM

## 2014-02-23 DIAGNOSIS — Z5112 Encounter for antineoplastic immunotherapy: Secondary | ICD-10-CM

## 2014-02-23 LAB — COMPREHENSIVE METABOLIC PANEL (CC13)
ALBUMIN: 3.9 g/dL (ref 3.5–5.0)
ALT: 17 U/L (ref 0–55)
AST: 17 U/L (ref 5–34)
Alkaline Phosphatase: 70 U/L (ref 40–150)
Anion Gap: 8 mEq/L (ref 3–11)
BUN: 12.6 mg/dL (ref 7.0–26.0)
CALCIUM: 9.4 mg/dL (ref 8.4–10.4)
CHLORIDE: 107 meq/L (ref 98–109)
CO2: 26 mEq/L (ref 22–29)
Creatinine: 0.8 mg/dL (ref 0.6–1.1)
EGFR: >90 mL/min/{1.73_m2} (ref >90)
Glucose: 83 mg/dl (ref 70–140)
POTASSIUM: 3.7 meq/L (ref 3.5–5.1)
SODIUM: 141 meq/L (ref 136–145)
Total Bilirubin: 0.3 mg/dL (ref 0.20–1.20)
Total Protein: 7.3 g/dL (ref 6.4–8.3)

## 2014-02-23 LAB — CBC WITH DIFFERENTIAL/PLATELET
BASO%: 0.8 % (ref 0.0–2.0)
Basophils Absolute: 0 10*3/uL (ref 0.0–0.1)
EOS%: 4.9 % (ref 0.0–7.0)
Eosinophils Absolute: 0.2 10*3/uL (ref 0.0–0.5)
HCT: 39 % (ref 34.8–46.6)
HGB: 13 g/dL (ref 11.6–15.9)
LYMPH%: 27.6 % (ref 14.0–49.7)
MCH: 29.9 pg (ref 25.1–34.0)
MCHC: 33.3 g/dL (ref 31.5–36.0)
MCV: 89.7 fL (ref 79.5–101.0)
MONO#: 0.3 10*3/uL (ref 0.1–0.9)
MONO%: 7.9 % (ref 0.0–14.0)
NEUT%: 58.8 % (ref 38.4–76.8)
NEUTROS ABS: 2.2 10*3/uL (ref 1.5–6.5)
Platelets: 227 10*3/uL (ref 145–400)
RBC: 4.35 10*6/uL (ref 3.70–5.45)
RDW: 12.7 % (ref 11.2–14.5)
WBC: 3.7 10*3/uL — AB (ref 3.9–10.3)
lymph#: 1 10*3/uL (ref 0.9–3.3)
nRBC: 0 % (ref 0–0)

## 2014-02-23 MED ORDER — SODIUM CHLORIDE 0.9 % IV SOLN
Freq: Once | INTRAVENOUS | Status: AC
  Administered 2014-02-23: 10:00:00 via INTRAVENOUS

## 2014-02-23 MED ORDER — DIPHENHYDRAMINE HCL 25 MG PO CAPS
25.0000 mg | ORAL_CAPSULE | Freq: Once | ORAL | Status: AC
  Administered 2014-02-23: 25 mg via ORAL

## 2014-02-23 MED ORDER — TRASTUZUMAB CHEMO INJECTION 440 MG
6.0000 mg/kg | Freq: Once | INTRAVENOUS | Status: AC
  Administered 2014-02-23: 567 mg via INTRAVENOUS
  Filled 2014-02-23: qty 27

## 2014-02-23 MED ORDER — DEXAMETHASONE SODIUM PHOSPHATE 10 MG/ML IJ SOLN
4.0000 mg | Freq: Once | INTRAMUSCULAR | Status: AC
  Administered 2014-02-23: 4 mg via INTRAVENOUS

## 2014-02-23 MED ORDER — DEXAMETHASONE SODIUM PHOSPHATE 10 MG/ML IJ SOLN
INTRAMUSCULAR | Status: AC
  Filled 2014-02-23: qty 1

## 2014-02-23 MED ORDER — DIPHENHYDRAMINE HCL 25 MG PO CAPS
ORAL_CAPSULE | ORAL | Status: AC
  Filled 2014-02-23: qty 1

## 2014-02-23 NOTE — Telephone Encounter (Signed)
Per staff message and POF I have scheduled appts. Advised scheduler of appts. JMW  

## 2014-02-23 NOTE — Patient Instructions (Signed)
[facility] Discharge Instructions for Patients Receiving Chemotherapy  Today you received the following chemotherapy agents Herceptin.  To help prevent nausea and vomiting after your treatment, we encourage you to take your nausea medication as prescribed.   If you develop nausea and vomiting that is not controlled by your nausea medication, call the clinic.   BELOW ARE SYMPTOMS THAT SHOULD BE REPORTED IMMEDIATELY:  *FEVER GREATER THAN 100.5 F  *CHILLS WITH OR WITHOUT FEVER  NAUSEA AND VOMITING THAT IS NOT CONTROLLED WITH YOUR NAUSEA MEDICATION  *UNUSUAL SHORTNESS OF BREATH  *UNUSUAL BRUISING OR BLEEDING  TENDERNESS IN MOUTH AND THROAT WITH OR WITHOUT PRESENCE OF ULCERS  *URINARY PROBLEMS  *BOWEL PROBLEMS  UNUSUAL RASH Items with * indicate a potential emergency and should be followed up as soon as possible.  Feel free to call the clinic you have any questions or concerns. The clinic phone number is [phone].    

## 2014-02-23 NOTE — Telephone Encounter (Signed)
per pof to sch pt appt-sent MW email to pt to sch pt trmt-pt to get b4 leaving

## 2014-02-23 NOTE — Progress Notes (Signed)
[facility]  [phone] [phone]     ID: Elizabeth Miles DOB: 1966/01/03  MR#: [id number]  KNL#:[id number]  Patient Care Team: Clinton Quant, MD as PCP - General (Internal Medicine) Clinton Quant, MD as Referring Physician (Internal Medicine) Rolm Bookbinder, MD as Consulting Physician (General Surgery) Chauncey Cruel, MD as Consulting Physician (Oncology) Thea Silversmith, MD as Consulting Physician (Radiation Oncology) San Morelle, MD as Consulting Physician (Gynecology)   CHIEF COMPLAINT: right breat cancer CURRENT TREATMENT: trastuzumab q 3 weeks, tamoxifen daily  BREAST CANCER HISTORY: From Dr. Dana Allan earlier notes:  "Elizabeth Miles is a 48 y.o. female. Without significant past medical history who on June 2013 had a mammogram that was normal. But there was on physical exam possibility of a cyst noted in the right breast. The mammogram was negative. In 2014 June patient noted on exam another lump in the right breast. She underwent a diagnostic mammogram on June 10 that showed a right breast nodule in the outer quadrant. She had an ultrasound performed that showed at the 9:30 o'clock position a 3.6 cm area and then added 10:00 position 1.3 cm area with a total area being anywhere between 5-6 cm. The patient went on to have a right breast biopsy performed in [place]. The pathology revealed an invasive ductal carcinoma. This is been confirmed by our pathology as well. The carcinoma and papillary features and was felt to be between a grade 1 and 2. The tumor was estrogen receptor positive strongly (100%) progesterone receptor negative HER-2/neu negative with a Ki-67 that showed a high proliferation rate. Patient is now seen in medical oncology for discussion of treatment options."  Bilateral breast MRI on 08/27/2012 revealed in the right upper quadrant irregular lobulated mass with a satellite nodule or lobulation within 2 mm at its  superior aspect, measured together as 4.5 x 4.0 x 3.8 cm. There was extension of enhancement to the nipple, suggesting nipple involvement may be present. No lymphadenopathy was noted there was no any other area of abnormal enhancement in either breast (clinical stage IIA, T2 N0).  PET scan performed on 08/29/2012 revealed the primary breast cancer measuring 3.2 x 3.3 cm with SUV of 16. It was adjacent nodule along the superiomedial border of the primary mass measuring 1.4 cm which was also hypermetabolic. There were no additional areas of abnormal hypermetabolism. No abnormal hypermetabolic activity was seen in the chest, abdomen/pelvis,within the liver, pancreas, adrenal glands or spleen. No hypermetabolic lymph nodes. In the skeleton, no focal hypermetabolic activity to suggest skeletal metastasis was seen.  Completed neoadjuvant chemotherapy consisting of Q14 day Adriamycin/Cytoxan x 4 cycles on 10/25/2012, followed by one dose of neoadjuvant Taxol on 11/08/2012. She developed significant grade 2 neuropathy and Taxol was discontinued.  Then received neoadjuvant chemotherapy consisting of single agent Abraxane given on day 1, 8, 15 of each 28 day cycle. She completed therapy on 11/15/12 - 02/14/13.   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 Repeat HER-2 testing was now positive. The patient was started on tamoxifen March 2015 and on Herceptin the same month.  Her subsequent history is as detailed below.  INTERVAL HISTORY: Elizabeth Miles returns to clinic today for follow up of her breast cancer. She continues with tamoxifen daily and trastuzumab every 3 weeks. She is tolerating these treatments well. She continues to have  mild left facial numbness after her trastuzumab infusions, but this is greatly reduced with the use of zyrtec before treatment and  nightly for a few days afterwards. She has been prescribed gabapentin by Neurology for this issue previously, but has not taken it.   Elizabeth Miles was seen in urgent care last week for ear fullness which turned out to be an acute infection to her right ear. She was prescribed cipro, and the fullness is still there but has improved somewhat. On occasion for the past several months she has complained of tinnitus as well. She also took singular for a night or two. Her sore throat and sinus pressure are completely gone.  REVIEW OF SYSTEMS: Elizabeth Miles denies fevers, chills, nausea, vomiting, or changes in bowel or bladder habits. She has not shortness of breath, chest pain, palpitations, cough, or fatigue. She has scalp tenderness that has bothered her for the past few months similar to when she first lost her hair, but has not lost any so far. She has leg and calf pain off and on. A detailed review of systems is otherwise noncontributory.   PAST MEDICAL HISTORY: Past Medical History  Diagnosis Date  . 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 GERD (gastroesophageal reflux disease)   . GERD (gastroesophageal reflux disease) 08/22/2012  . Allergy   . Complication of anesthesia     1986 ; problem waking up  . Anxiety   . Wears glasses     PAST SURGICAL HISTORY: Past Surgical History  Procedure Laterality Date  . Dilation and curettage of uterus  1993  . Cervical ablation  1983  . Breast biopsy Right 08/06/12  . Popliteal synovial cyst excision  1970  . Portacath placement Left 09/12/2012    Procedure: INSERTION PORT-A-CATH;  Surgeon: Rolm Bookbinder, MD;  Location: [facility];  Service: General;  Laterality: Left;  . Breast lumpectomy with needle localization and axillary sentinel lymph node bx Right 03/10/2013    Procedure: BREAST LUMPECTOMY WITH NEEDLE LOCALIZATION AND AXILLARY SENTINEL LYMPH NODE BIOPSY;  Surgeon: Rolm Bookbinder, MD;  Location: [facility];  Service: General;   Laterality: Right;  . Port-a-cath removal Left 03/10/2013    Procedure: REMOVAL PORT-A-CATH;  Surgeon: Rolm Bookbinder, MD;  Location: [facility];  Service: General;  Laterality: Left;    FAMILY HISTORY Family History  Problem Relation Age of Onset  . Hypertension Mother   . Bladder Cancer Maternal Uncle 68  . Cancer Paternal Grandmother 110    lung cancer  . Lung cancer Paternal Grandfather     dx in his 75s  . COPD Paternal Uncle    the patient's parents are living and in good health. The patient has one brother, no sisters. There is no history of breast or ovarian cancer in the family to her knowledge  GYNECOLOGIC HISTORY:  Patient's last menstrual period was 03/12/2012. Menarche age 37, first live birth age 66. The patient is GX P3. 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 Her husband, Jori Moll, is a Tour manager. As daughter Elizabeth Miles, 68, lives in [place] and daughter Elizabeth Miles just had a baby girl, the patient's first grandchild. Son Jori Moll "Elizabeth Miles" is 56 and at home. The patient is a Psychologist, forensic.    ADVANCED DIRECTIVES:    HEALTH MAINTENANCE: History  Substance Use Topics  . Smoking status: Never Smoker   .  Smokeless tobacco: Never Used  . Alcohol Use: No     Colonoscopy:  PAP:  Bone density:  Lipid panel:  Allergies  Allergen Reactions  . Darvocet [Propoxyphene N-Acetaminophen] Shortness Of Breath and Swelling  . Shellfish Allergy Shortness Of Breath and Swelling  . Other     CT dye, "chest hurt" "makes me feel cold"    Current Outpatient Prescriptions  Medication Sig Dispense Refill  . cetirizine (ZYRTEC) 10 MG tablet Take 10 mg by mouth daily.    Marland Kitchen Dexlansoprazole (DEXILANT) 30 MG capsule Take 1 capsule (30 mg total) by mouth daily. 90 capsule 4  . ibuprofen (ADVIL,MOTRIN) 800 MG tablet Take  800 mg by mouth every 8 (eight) hours as needed for moderate pain (for back pain).    . tamoxifen (NOLVADEX) 20 MG tablet Take 1 tablet (20 mg total) by mouth daily. 90 tablet 3  . acetaminophen (TYLENOL) 500 MG tablet Take 1,000 mg by mouth every 6 (six) hours as needed.    . gabapentin (NEURONTIN) 100 MG capsule Take as needed for facial tingling, once daily (Patient not taking: Reported on 02/23/2014) 30 capsule 3   No current facility-administered medications for this visit.   Facility-Administered Medications Ordered in Other Visits  Medication Dose Route Frequency Provider Last Rate Last Dose  . trastuzumab (HERCEPTIN) 567 mg in sodium chloride 0.9 % 250 mL chemo infusion  6 mg/kg (Treatment Plan Actual) Intravenous Once Chauncey Cruel, MD 184.7 mL/hr at 02/23/14 1038 567 mg at 02/23/14 1038    OBJECTIVE: Middle-aged white woman in no acute distress Filed Vitals:   02/23/14 0850  BP: 128/80  Pulse: 78  Temp: 98.4 F (36.9 C)  Resp: 18     Body mass index is 32.59 kg/(m^2).    ECOG FS:1 - Symptomatic but completely ambulatory  Sclerae unicteric, pupils equal and reactive Oropharynx clear and moist-- no thrush, fluid behind right TM, but no signs of infection No cervical or supraclavicular adenopathy Lungs no rales or rhonchi Heart regular rate and rhythm Abd soft, nontender, positive bowel sounds MSK no focal spinal tenderness, no upper extremity lymphedema Neuro: nonfocal, well oriented, appropriate affect Breasts: deferred  LAB RESULTS:  CMP     Component Value Date/Time   NA 141 02/23/2014 0829   NA 144 08/17/2013 0346   K 3.7 02/23/2014 0829   K 3.8 08/17/2013 0346   CL 105 08/17/2013 0346   CO2 26 02/23/2014 0829   GLUCOSE 83 02/23/2014 0829   GLUCOSE 155* 08/17/2013 0346   BUN 12.6 02/23/2014 0829   BUN 8 08/17/2013 0346   CREATININE 0.8 02/23/2014 0829   CREATININE 0.80 08/17/2013 0346   CALCIUM 9.4 02/23/2014 0829   PROT 7.3 02/23/2014 0829   ALBUMIN  3.9 02/23/2014 0829   AST 17 02/23/2014 0829   ALT 17 02/23/2014 0829   ALKPHOS 70 02/23/2014 0829   BILITOT 0.30 02/23/2014 0829    I No results found for: SPEP  Lab Results  Component Value Date   WBC 3.7* 02/23/2014   NEUTROABS 2.2 02/23/2014   HGB 13.0 02/23/2014   HCT 39.0 02/23/2014   MCV 89.7 02/23/2014   PLT 227 02/23/2014      Chemistry      Component Value Date/Time   NA 141 02/23/2014 0829   NA 144 08/17/2013 0346   K 3.7 02/23/2014 0829   K 3.8 08/17/2013 0346   CL 105 08/17/2013 0346   CO2 26 02/23/2014 0829   BUN 12.6 02/23/2014  0829   BUN 8 08/17/2013 0346   CREATININE 0.8 02/23/2014 0829   CREATININE 0.80 08/17/2013 0346      Component Value Date/Time   CALCIUM 9.4 02/23/2014 0829   ALKPHOS 70 02/23/2014 0829   AST 17 02/23/2014 0829   ALT 17 02/23/2014 0829   BILITOT 0.30 02/23/2014 0829       No results found for: LABCA2  No components found for: [id number]  No results for input(s): INR in the last 168 hours.  Urinalysis    Component Value Date/Time   LABSPEC 1.010 12/13/2012 1435   GLUCOSEU Negative 12/13/2012 1435   UROBILINOGEN 0.2 12/13/2012 1435    STUDIES: Most recent echocardiogram on 12/23/13 showed an ejection fraction of 60%  ASSESSMENT: 48 y.o. [place] woman  (1) status post right breast biopsy 08/06/2012 for a clinical mT2 N0, stage IIA invasive ductal carcinoma, estrogen receptor 3+ positive, progesterone receptor and HER-2 negative, Ki67 3+ [[id number]]  (2) treated neoadjuvant [place] dose dense cyclophosphamide and doxorubicin x4, completed 10/25/2012, followed by a single dose of weekly paclitaxel, poorly tolerated; followed by 11 doses of Abraxane completed 02/14/2013  (3) status post right lumpectomy and sentinel lymph node sampling 03/10/2013 for a pT2 pN0, stage IIA invasive ductal carcinoma, grade 2, estrogen receptor 100% positive, progesterone receptor 21% positive, with an MIB-1 of 14%, and HER-2  amplified, the signals ratio being 2.52, the number per cell 2.90  (4) adjuvant radiation completed March 2015 (?)  (5) started tamoxifen March 2015  (6) started trastuzumab 05/23/2013; echocardiogram 12/23/13 shows a normal ejection fraction   PLAN: The labs were reviewed in detail and were entirely stable. She will proceed with trastuzumab today as scheduled. She is tolerating the tamoxifen well with no side effects that she is aware of and will continue this drug daily with the goal of 5-10 years of antiestrogen therapy.   Elizabeth Miles and I spend an additional 20 minutes discussing her ear discomfort and fullness. The cipro has obviously managed her upper respiratory infection well and she still has 4 days left on the drug. I advised that she complete this prescription but also continue on the singular nightly. I advised that she try nasocort OTC, 1 spray in each nostril BID to reduce the inflammation to her sinuses that may be putting undue pressure on her eardrums.   Elizabeth Miles's leg pain is likely due to dehydration. I advised she increase her intake of water to decrease this sensation.   Elizabeth Miles will continue trastuzumab every 3 weeks until mid March when she will have completed 1 year of anti-HER2 therapy. She is next due for her echocardiogram in late January and I have placed orders for this procedure. She will return for her next office visit in 6 weeks. She understands and agrees with this plan. She knows the goal of treatment in her case is cure. She has been encouraged to call with any issues that might arise before her next visit here.    Marcelino Duster, NP   02/23/2014 11:39 AM

## 2014-02-24 ENCOUNTER — Encounter: Payer: Self-pay | Admitting: Nurse Practitioner

## 2014-03-13 ENCOUNTER — Other Ambulatory Visit: Payer: Self-pay | Admitting: Oncology

## 2014-03-13 ENCOUNTER — Telehealth: Payer: Self-pay | Admitting: Nurse Practitioner

## 2014-03-13 ENCOUNTER — Ambulatory Visit (HOSPITAL_BASED_OUTPATIENT_CLINIC_OR_DEPARTMENT_OTHER): Payer: BLUE CROSS/BLUE SHIELD

## 2014-03-13 ENCOUNTER — Other Ambulatory Visit (HOSPITAL_BASED_OUTPATIENT_CLINIC_OR_DEPARTMENT_OTHER): Payer: BLUE CROSS/BLUE SHIELD

## 2014-03-13 ENCOUNTER — Ambulatory Visit (HOSPITAL_BASED_OUTPATIENT_CLINIC_OR_DEPARTMENT_OTHER): Payer: BLUE CROSS/BLUE SHIELD | Admitting: Nurse Practitioner

## 2014-03-13 ENCOUNTER — Encounter: Payer: Self-pay | Admitting: Nurse Practitioner

## 2014-03-13 VITALS — BP 118/71 | HR 84 | Temp 98.0°F | Resp 20 | Ht 67.0 in | Wt 208.5 lb

## 2014-03-13 DIAGNOSIS — Z5112 Encounter for antineoplastic immunotherapy: Secondary | ICD-10-CM

## 2014-03-13 DIAGNOSIS — H938X1 Other specified disorders of right ear: Secondary | ICD-10-CM

## 2014-03-13 DIAGNOSIS — C50411 Malignant neoplasm of upper-outer quadrant of right female breast: Secondary | ICD-10-CM

## 2014-03-13 DIAGNOSIS — K219 Gastro-esophageal reflux disease without esophagitis: Secondary | ICD-10-CM

## 2014-03-13 DIAGNOSIS — C50919 Malignant neoplasm of unspecified site of unspecified female breast: Secondary | ICD-10-CM

## 2014-03-13 DIAGNOSIS — H9201 Otalgia, right ear: Secondary | ICD-10-CM

## 2014-03-13 DIAGNOSIS — Z170 Estrogen receptor positive status [ER+]: Secondary | ICD-10-CM

## 2014-03-13 LAB — CBC WITH DIFFERENTIAL/PLATELET
BASO%: 1 % (ref 0.0–2.0)
BASOS ABS: 0 10*3/uL (ref 0.0–0.1)
EOS%: 4.5 % (ref 0.0–7.0)
Eosinophils Absolute: 0.1 10*3/uL (ref 0.0–0.5)
HEMATOCRIT: 39.1 % (ref 34.8–46.6)
HGB: 12.9 g/dL (ref 11.6–15.9)
LYMPH%: 34.5 % (ref 14.0–49.7)
MCH: 29.8 pg (ref 25.1–34.0)
MCHC: 33 g/dL (ref 31.5–36.0)
MCV: 90.3 fL (ref 79.5–101.0)
MONO#: 0.3 10*3/uL (ref 0.1–0.9)
MONO%: 10.8 % (ref 0.0–14.0)
NEUT#: 1.4 10*3/uL — ABNORMAL LOW (ref 1.5–6.5)
NEUT%: 49.2 % (ref 38.4–76.8)
Platelets: 235 10*3/uL (ref 145–400)
RBC: 4.33 10*6/uL (ref 3.70–5.45)
RDW: 13.1 % (ref 11.2–14.5)
WBC: 2.9 10*3/uL — AB (ref 3.9–10.3)
lymph#: 1 10*3/uL (ref 0.9–3.3)
nRBC: 0 % (ref 0–0)

## 2014-03-13 MED ORDER — TRASTUZUMAB CHEMO INJECTION 440 MG
6.0000 mg/kg | Freq: Once | INTRAVENOUS | Status: AC
  Administered 2014-03-13: 567 mg via INTRAVENOUS
  Filled 2014-03-13: qty 27

## 2014-03-13 MED ORDER — SODIUM CHLORIDE 0.9 % IV SOLN
Freq: Once | INTRAVENOUS | Status: AC
  Administered 2014-03-13: 12:00:00 via INTRAVENOUS

## 2014-03-13 MED ORDER — DIPHENHYDRAMINE HCL 25 MG PO CAPS
ORAL_CAPSULE | ORAL | Status: AC
  Filled 2014-03-13: qty 1

## 2014-03-13 MED ORDER — DEXAMETHASONE SODIUM PHOSPHATE 10 MG/ML IJ SOLN
4.0000 mg | Freq: Once | INTRAMUSCULAR | Status: AC
  Administered 2014-03-13: 4 mg via INTRAVENOUS

## 2014-03-13 MED ORDER — DEXAMETHASONE SODIUM PHOSPHATE 10 MG/ML IJ SOLN
INTRAMUSCULAR | Status: AC
  Filled 2014-03-13: qty 1

## 2014-03-13 MED ORDER — DIPHENHYDRAMINE HCL 25 MG PO CAPS
25.0000 mg | ORAL_CAPSULE | Freq: Once | ORAL | Status: AC
  Administered 2014-03-13: 25 mg via ORAL

## 2014-03-13 NOTE — Telephone Encounter (Signed)
sent [facility] Basta pre-cert for [facility] pt will call with sch after reply

## 2014-03-13 NOTE — Progress Notes (Signed)
[facility]  [phone]) [phone] [phone]     ID: TORIA MONTE DOB: 24-May-1965  MR#: [id number]  MCN#:[id number]  Patient Care Team: Clinton Quant, MD as PCP - General (Internal Medicine) Clinton Quant, MD as Referring Physician (Internal Medicine) Rolm Bookbinder, MD as Consulting Physician (General Surgery) Chauncey Cruel, MD as Consulting Physician (Oncology) Thea Silversmith, MD as Consulting Physician (Radiation Oncology) San Morelle, MD as Consulting Physician (Gynecology)   CHIEF COMPLAINT: right breat cancer CURRENT TREATMENT: trastuzumab q 3 weeks, tamoxifen daily  BREAST CANCER HISTORY: From Dr. Dana Allan earlier notes:  "Elizabeth Miles is a 49 y.o. female. Without significant past medical history who on June 2013 had a mammogram that was normal. But there was on physical exam possibility of a cyst noted in the right breast. The mammogram was negative. In 2014 June patient noted on exam another lump in the right breast. She underwent a diagnostic mammogram on June 10 that showed a right breast nodule in the outer quadrant. She had an ultrasound performed that showed at the 9:30 o'clock position a 3.6 cm area and then added 10:00 position 1.3 cm area with a total area being anywhere between 5-6 cm. The patient went on to have a right breast biopsy performed in [place]. The pathology revealed an invasive ductal carcinoma. This is been confirmed by our pathology as well. The carcinoma and papillary features and was felt to be between a grade 1 and 2. The tumor was estrogen receptor positive strongly (100%) progesterone receptor negative HER-2/neu negative with a Ki-67 that showed a high proliferation rate. Patient is now seen in medical oncology for discussion of treatment options."  Bilateral breast MRI on 08/27/2012 revealed in the right upper quadrant irregular lobulated mass with a satellite nodule or lobulation within 2 mm at its  superior aspect, measured together as 4.5 x 4.0 x 3.8 cm. There was extension of enhancement to the nipple, suggesting nipple involvement may be present. No lymphadenopathy was noted there was no any other area of abnormal enhancement in either breast (clinical stage IIA, T2 N0).  PET scan performed on 08/29/2012 revealed the primary breast cancer measuring 3.2 x 3.3 cm with SUV of 16. It was adjacent nodule along the superiomedial border of the primary mass measuring 1.4 cm which was also hypermetabolic. There were no additional areas of abnormal hypermetabolism. No abnormal hypermetabolic activity was seen in the chest, abdomen/pelvis,within the liver, pancreas, adrenal glands or spleen. No hypermetabolic lymph nodes. In the skeleton, no focal hypermetabolic activity to suggest skeletal metastasis was seen.  Completed neoadjuvant chemotherapy consisting of Q14 day Adriamycin/Cytoxan x 4 cycles on 10/25/2012, followed by one dose of neoadjuvant Taxol on 11/08/2012. She developed significant grade 2 neuropathy and Taxol was discontinued.  Then received neoadjuvant chemotherapy consisting of single agent Abraxane given on day 1, 8, 15 of each 28 day cycle. She completed therapy on 11/15/12 - 02/14/13.   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 Repeat HER-2 testing was now positive. The patient was started on tamoxifen March 2015 and on Herceptin the same month.  Her subsequent history is as detailed below.  INTERVAL HISTORY: Elizabeth Miles returns to clinic today for follow up of her breast cancer, accompanied by her mother. She continues with tamoxifen daily and trastuzumab every 3 weeks. She is tolerating these treatments well.  She has some hot flashes and joint aches, but denies vaginal changes. She continues to have mild left facial numbness after her  trastuzumab infusions, but this is greatly reduced with the use of zyrtec before treatment and nightly for a few days afterwards. She has been prescribed gabapentin by Neurology for this issue previously, but prefers not to take it. Elizabeth Miles had an episode of shortness of breath, and intense back pain this Saturday. She has "extreme" heartburn/GERD, and has been prescribed dexlansoprazole by her PCP for this issue. She has not had this sensation since that event. Elizabeth Miles plans to take to the beach this weekend with her family.   REVIEW OF SYSTEMS: Elizabeth Miles denies fevers, chills, nausea, vomiting, or changes in bowel or bladder habits. She has not shortness of breath, chest pain, palpitations, cough, or fatigue. Her right ear continues to bother her. It is difficult to describe, but she feels like her hearing has increased. She can hear the tiniest whisper, and when there is no sound at all, sometimes she can hear buzzing. she would like an ENT referral. A detailed review of systems is otherwise noncontributory.   PAST MEDICAL HISTORY: Past Medical History  Diagnosis Date  . 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 GERD (gastroesophageal reflux disease)   . GERD (gastroesophageal reflux disease) 08/22/2012  . Allergy   . Complication of anesthesia     1986 ; problem waking up  . Anxiety   . Wears glasses     PAST SURGICAL HISTORY: Past Surgical History  Procedure Laterality Date  . Dilation and curettage of uterus  1993  . Cervical ablation  1983  . Breast biopsy Right 08/06/12  . Popliteal synovial cyst excision  1970  . Portacath placement Left 09/12/2012    Procedure: INSERTION PORT-A-CATH;  Surgeon: Rolm Bookbinder, MD;  Location: [facility];  Service: General;  Laterality: Left;  . Breast lumpectomy with needle localization and axillary sentinel lymph node bx Right 03/10/2013    Procedure: BREAST LUMPECTOMY WITH NEEDLE LOCALIZATION AND AXILLARY SENTINEL LYMPH NODE BIOPSY;  Surgeon:  Rolm Bookbinder, MD;  Location: [facility];  Service: General;  Laterality: Right;  . Port-a-cath removal Left 03/10/2013    Procedure: REMOVAL PORT-A-CATH;  Surgeon: Rolm Bookbinder, MD;  Location: [facility];  Service: General;  Laterality: Left;    FAMILY HISTORY Family History  Problem Relation Age of Onset  . Hypertension Mother   . Bladder Cancer Maternal Uncle 68  . Cancer Paternal Grandmother 14    lung cancer  . Lung cancer Paternal Grandfather     dx in his 75s  . COPD Paternal Uncle    the patient's parents are living and in good health. The patient has one brother, no sisters. There is no history of breast or ovarian cancer in the family to her knowledge  GYNECOLOGIC HISTORY:  Patient's last menstrual period was 03/12/2012. Menarche age 84, first live birth age 1. The patient is GX P3. 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 Her husband, Jori Moll, is a Tour manager. As daughter Lovena Le, 75, lives in [place] and daughter Caryl Pina just had a baby girl, the patient's first grandchild. Son Jori Moll "Carlynn Spry" is 4 and at home. The patient is a Psychologist, forensic.    ADVANCED DIRECTIVES:    HEALTH MAINTENANCE: History  Substance Use Topics  .  Smoking status: Never Smoker   . Smokeless tobacco: Never Used  . Alcohol Use: No     Colonoscopy:  PAP:  Bone density:  Lipid panel:  Allergies  Allergen Reactions  . Darvocet [Propoxyphene N-Acetaminophen] Shortness Of Breath and Swelling  . Shellfish Allergy Shortness Of Breath and Swelling  . Other     CT dye, "chest hurt" "makes me feel cold"    Current Outpatient Prescriptions  Medication Sig Dispense Refill  . acetaminophen (TYLENOL) 500 MG tablet Take 1,000 mg by mouth every 6 (six) hours as needed.    . cetirizine (ZYRTEC) 10 MG tablet  Take 10 mg by mouth daily.    Marland Kitchen Dexlansoprazole (DEXILANT) 30 MG capsule Take 1 capsule (30 mg total) by mouth daily. 90 capsule 4  . tamoxifen (NOLVADEX) 20 MG tablet Take 1 tablet (20 mg total) by mouth daily. 90 tablet 3  . gabapentin (NEURONTIN) 100 MG capsule Take as needed for facial tingling, once daily (Patient not taking: Reported on 02/23/2014) 30 capsule 3  . ibuprofen (ADVIL,MOTRIN) 800 MG tablet Take 800 mg by mouth every 8 (eight) hours as needed for moderate pain (for back pain).     No current facility-administered medications for this visit.    OBJECTIVE: Middle-aged white woman in no acute distress Filed Vitals:   03/13/14 1019  BP: 118/71  Pulse: 84  Temp: 98 F (36.7 C)  Resp: 20     Body mass index is 32.65 kg/(m^2).    ECOG FS:1 - Symptomatic but completely ambulatory  Skin: warm, dry  HEENT: sclerae anicteric, conjunctivae pink, oropharynx clear. No thrush or mucositis. TMs normal bilaterally Lymph Nodes: No cervical or supraclavicular lymphadenopathy  Lungs: clear to auscultation bilaterally, no rales, wheezes, or rhonci  Heart: regular rate and rhythm  Abdomen: round, soft, non tender, positive bowel sounds  Musculoskeletal: No focal spinal tenderness, no peripheral edema  Neuro: non focal, well oriented, positive affect  Breasts: deferred  LAB RESULTS:  CMP     Component Value Date/Time   NA 141 02/23/2014 0829   NA 144 08/17/2013 0346   K 3.7 02/23/2014 0829   K 3.8 08/17/2013 0346   CL 105 08/17/2013 0346   CO2 26 02/23/2014 0829   GLUCOSE 83 02/23/2014 0829   GLUCOSE 155* 08/17/2013 0346   BUN 12.6 02/23/2014 0829   BUN 8 08/17/2013 0346   CREATININE 0.8 02/23/2014 0829   CREATININE 0.80 08/17/2013 0346   CALCIUM 9.4 02/23/2014 0829   PROT 7.3 02/23/2014 0829   ALBUMIN 3.9 02/23/2014 0829   AST 17 02/23/2014 0829   ALT 17 02/23/2014 0829   ALKPHOS 70 02/23/2014 0829   BILITOT 0.30 02/23/2014 0829    I No results found for:  SPEP  Lab Results  Component Value Date   WBC 2.9* 03/13/2014   NEUTROABS 1.4* 03/13/2014   HGB 12.9 03/13/2014   HCT 39.1 03/13/2014   MCV 90.3 03/13/2014   PLT 235 03/13/2014      Chemistry      Component Value Date/Time   NA 141 02/23/2014 0829   NA 144 08/17/2013 0346   K 3.7 02/23/2014 0829   K 3.8 08/17/2013 0346   CL 105 08/17/2013 0346   CO2 26 02/23/2014 0829   BUN 12.6 02/23/2014 0829   BUN 8 08/17/2013 0346   CREATININE 0.8 02/23/2014 0829   CREATININE 0.80 08/17/2013 0346      Component Value Date/Time   CALCIUM 9.4 02/23/2014 0829   ALKPHOS  70 02/23/2014 0829   AST 17 02/23/2014 0829   ALT 17 02/23/2014 0829   BILITOT 0.30 02/23/2014 0829       No results found for: LABCA2  No components found for: IAXKP537  No results for input(s): INR in the last 168 hours.  Urinalysis    Component Value Date/Time   LABSPEC 1.010 12/13/2012 1435   GLUCOSEU Negative 12/13/2012 1435   UROBILINOGEN 0.2 12/13/2012 1435    STUDIES: Most recent echocardiogram on 12/23/13 showed an ejection fraction of 60%  ASSESSMENT: 49 y.o. [place] woman  (1) status post right breast biopsy 08/06/2012 for a clinical mT2 N0, stage IIA invasive ductal carcinoma, estrogen receptor 3+ positive, progesterone receptor and HER-2 negative, Ki67 3+ [[id number]]  (2) treated neoadjuvant [place] dose dense cyclophosphamide and doxorubicin x4, completed 10/25/2012, followed by a single dose of weekly paclitaxel, poorly tolerated; followed by 11 doses of Abraxane completed 02/14/2013  (3) status post right lumpectomy and sentinel lymph node sampling 03/10/2013 for a pT2 pN0, stage IIA invasive ductal carcinoma, grade 2, estrogen receptor 100% positive, progesterone receptor 21% positive, with an MIB-1 of 14%, and HER-2 amplified, the signals ratio being 2.52, the number per cell 2.90  (4) adjuvant radiation completed March 2015 (?)  (5) started tamoxifen March 2015  (6)  started trastuzumab 05/23/2013; echocardiogram 12/23/13 shows a normal ejection fraction   PLAN: Elizabeth Miles looks and feels well today. The labs were reviewed in detail and were entirely stable. She will proceed with trastuzumab today as scheduled and will of course continue with tamoxifen daily.   Elizabeth Miles is giving in to the ENT referral to investigate her right ear discomfort and I have placed these orders today. I have also placed orders for a repeat echocardiogram to be performed this month.   Elizabeth Miles will continue trastuzumab every 3 week. She will complete this in March of this year. She understands and agrees with this plan. She knows the goal of treatment in her case is cure. She has been encouraged to call with any issues that might arise before her next visit here.   Marcelino Duster, NP   03/13/2014 10:56 AM

## 2014-03-13 NOTE — Patient Instructions (Signed)
[facility] Discharge Instructions for Patients Receiving Chemotherapy  Today you received the following chemotherapy agents herceptin   To help prevent nausea and vomiting after your treatment, we encourage you to take your nausea medication as directed   If you develop nausea and vomiting that is not controlled by your nausea medication, call the clinic.   BELOW ARE SYMPTOMS THAT SHOULD BE REPORTED IMMEDIATELY:  *FEVER GREATER THAN 100.5 F  *CHILLS WITH OR WITHOUT FEVER  NAUSEA AND VOMITING THAT IS NOT CONTROLLED WITH YOUR NAUSEA MEDICATION  *UNUSUAL SHORTNESS OF BREATH  *UNUSUAL BRUISING OR BLEEDING  TENDERNESS IN MOUTH AND THROAT WITH OR WITHOUT PRESENCE OF ULCERS  *URINARY PROBLEMS  *BOWEL PROBLEMS  UNUSUAL RASH Items with * indicate a potential emergency and should be followed up as soon as possible.  Feel free to call the clinic you have any questions or concerns. The clinic phone number is [phone].  

## 2014-03-13 NOTE — Telephone Encounter (Signed)
per pof to sch pt appt per referral w/Wolinski-cld & left pt appt on vm gave time/date/location/tele#

## 2014-03-13 NOTE — Telephone Encounter (Signed)
per pof tp scj pt appt-gave pt copy of sch-sent MW emailt to sch trmt-pt has MY CHART and will ck for herc appt time

## 2014-03-15 ENCOUNTER — Other Ambulatory Visit: Payer: Self-pay | Admitting: Oncology

## 2014-03-16 ENCOUNTER — Telehealth: Payer: Self-pay | Admitting: *Deleted

## 2014-03-16 NOTE — Telephone Encounter (Signed)
Per staff message and POF I have scheduled appts. Advised scheduler of appts. JMW  

## 2014-03-23 ENCOUNTER — Telehealth: Payer: Self-pay | Admitting: Oncology

## 2014-03-23 NOTE — Telephone Encounter (Signed)
cld * left pt a message of ECHO appt time/location*adv pt to call [phone] if not  a good time. per Trinidad Curet gave [phone]

## 2014-03-24 ENCOUNTER — Other Ambulatory Visit: Payer: Self-pay | Admitting: *Deleted

## 2014-03-30 ENCOUNTER — Ambulatory Visit (HOSPITAL_COMMUNITY)
Admission: RE | Admit: 2014-03-30 | Discharge: 2014-03-30 | Disposition: A | Discharge status: Home / Self Care | Payer: BLUE CROSS/BLUE SHIELD | Source: Ambulatory Visit | Attending: Nurse Practitioner | Admitting: Nurse Practitioner

## 2014-03-30 DIAGNOSIS — I340 Nonrheumatic mitral (valve) insufficiency: Secondary | ICD-10-CM | POA: Insufficient documentation

## 2014-03-30 DIAGNOSIS — C50411 Malignant neoplasm of upper-outer quadrant of right female breast: Secondary | ICD-10-CM | POA: Insufficient documentation

## 2014-03-30 DIAGNOSIS — C50919 Malignant neoplasm of unspecified site of unspecified female breast: Secondary | ICD-10-CM

## 2014-03-30 NOTE — Progress Notes (Signed)
  Echocardiogram 2D Echocardiogram has been performed.  Rayner Erman 03/30/2014, 1:42 PM

## 2014-04-03 ENCOUNTER — Ambulatory Visit (HOSPITAL_BASED_OUTPATIENT_CLINIC_OR_DEPARTMENT_OTHER): Payer: BLUE CROSS/BLUE SHIELD

## 2014-04-03 ENCOUNTER — Other Ambulatory Visit (HOSPITAL_BASED_OUTPATIENT_CLINIC_OR_DEPARTMENT_OTHER): Payer: BLUE CROSS/BLUE SHIELD

## 2014-04-03 DIAGNOSIS — C50411 Malignant neoplasm of upper-outer quadrant of right female breast: Secondary | ICD-10-CM

## 2014-04-03 DIAGNOSIS — C50919 Malignant neoplasm of unspecified site of unspecified female breast: Secondary | ICD-10-CM

## 2014-04-03 DIAGNOSIS — Z5112 Encounter for antineoplastic immunotherapy: Secondary | ICD-10-CM

## 2014-04-03 LAB — CBC WITH DIFFERENTIAL/PLATELET
BASO%: 0.8 % (ref 0.0–2.0)
BASOS ABS: 0 10*3/uL (ref 0.0–0.1)
EOS%: 4.2 % (ref 0.0–7.0)
Eosinophils Absolute: 0.2 10*3/uL (ref 0.0–0.5)
HEMATOCRIT: 40 % (ref 34.8–46.6)
HGB: 13.2 g/dL (ref 11.6–15.9)
LYMPH%: 30.1 % (ref 14.0–49.7)
MCH: 30.1 pg (ref 25.1–34.0)
MCHC: 33 g/dL (ref 31.5–36.0)
MCV: 91.1 fL (ref 79.5–101.0)
MONO#: 0.4 10*3/uL (ref 0.1–0.9)
MONO%: 10.4 % (ref 0.0–14.0)
NEUT%: 54.5 % (ref 38.4–76.8)
NEUTROS ABS: 1.9 10*3/uL (ref 1.5–6.5)
Platelets: 248 10*3/uL (ref 145–400)
RBC: 4.39 10*6/uL (ref 3.70–5.45)
RDW: 12.8 % (ref 11.2–14.5)
WBC: 3.6 10*3/uL — AB (ref 3.9–10.3)
lymph#: 1.1 10*3/uL (ref 0.9–3.3)
nRBC: 0 % (ref 0–0)

## 2014-04-03 MED ORDER — DIPHENHYDRAMINE HCL 25 MG PO CAPS
ORAL_CAPSULE | ORAL | Status: AC
  Filled 2014-04-03: qty 1

## 2014-04-03 MED ORDER — DIPHENHYDRAMINE HCL 25 MG PO CAPS
25.0000 mg | ORAL_CAPSULE | Freq: Once | ORAL | Status: AC
  Administered 2014-04-03: 25 mg via ORAL

## 2014-04-03 MED ORDER — DEXAMETHASONE SODIUM PHOSPHATE 10 MG/ML IJ SOLN
4.0000 mg | Freq: Once | INTRAMUSCULAR | Status: AC
  Administered 2014-04-03: 4 mg via INTRAVENOUS

## 2014-04-03 MED ORDER — DEXAMETHASONE SODIUM PHOSPHATE 10 MG/ML IJ SOLN
INTRAMUSCULAR | Status: AC
  Filled 2014-04-03: qty 1

## 2014-04-03 MED ORDER — SODIUM CHLORIDE 0.9 % IV SOLN
Freq: Once | INTRAVENOUS | Status: AC
  Administered 2014-04-03: 10:00:00 via INTRAVENOUS

## 2014-04-03 MED ORDER — TRASTUZUMAB CHEMO INJECTION 440 MG
6.0000 mg/kg | Freq: Once | INTRAVENOUS | Status: AC
  Administered 2014-04-03: 567 mg via INTRAVENOUS
  Filled 2014-04-03: qty 27

## 2014-04-03 NOTE — Patient Instructions (Signed)
[facility] Discharge Instructions for Patients Receiving Chemotherapy  Today you received the following chemotherapy agents Herceptin.  To help prevent nausea and vomiting after your treatment, we encourage you to take your nausea medication as directed.   If you develop nausea and vomiting that is not controlled by your nausea medication, call the clinic.   BELOW ARE SYMPTOMS THAT SHOULD BE REPORTED IMMEDIATELY:  *FEVER GREATER THAN 100.5 F  *CHILLS WITH OR WITHOUT FEVER  NAUSEA AND VOMITING THAT IS NOT CONTROLLED WITH YOUR NAUSEA MEDICATION  *UNUSUAL SHORTNESS OF BREATH  *UNUSUAL BRUISING OR BLEEDING  TENDERNESS IN MOUTH AND THROAT WITH OR WITHOUT PRESENCE OF ULCERS  *URINARY PROBLEMS  *BOWEL PROBLEMS  UNUSUAL RASH Items with * indicate a potential emergency and should be followed up as soon as possible.  Feel free to call the clinic you have any questions or concerns. The clinic phone number is [phone].  

## 2014-04-09 ENCOUNTER — Encounter (HOSPITAL_COMMUNITY): Payer: BLUE CROSS/BLUE SHIELD

## 2014-04-24 ENCOUNTER — Encounter: Payer: Self-pay | Admitting: Nurse Practitioner

## 2014-04-24 ENCOUNTER — Telehealth: Payer: Self-pay | Admitting: Nurse Practitioner

## 2014-04-24 ENCOUNTER — Ambulatory Visit (HOSPITAL_BASED_OUTPATIENT_CLINIC_OR_DEPARTMENT_OTHER): Payer: BLUE CROSS/BLUE SHIELD

## 2014-04-24 ENCOUNTER — Other Ambulatory Visit (HOSPITAL_BASED_OUTPATIENT_CLINIC_OR_DEPARTMENT_OTHER): Payer: BLUE CROSS/BLUE SHIELD

## 2014-04-24 ENCOUNTER — Ambulatory Visit (HOSPITAL_BASED_OUTPATIENT_CLINIC_OR_DEPARTMENT_OTHER): Payer: BLUE CROSS/BLUE SHIELD | Admitting: Nurse Practitioner

## 2014-04-24 ENCOUNTER — Other Ambulatory Visit: Payer: Self-pay | Admitting: Oncology

## 2014-04-24 VITALS — BP 139/83 | HR 94 | Temp 98.5°F | Resp 18 | Ht 67.0 in | Wt 215.3 lb

## 2014-04-24 DIAGNOSIS — C50919 Malignant neoplasm of unspecified site of unspecified female breast: Secondary | ICD-10-CM

## 2014-04-24 DIAGNOSIS — C50411 Malignant neoplasm of upper-outer quadrant of right female breast: Secondary | ICD-10-CM

## 2014-04-24 DIAGNOSIS — Z5112 Encounter for antineoplastic immunotherapy: Secondary | ICD-10-CM

## 2014-04-24 DIAGNOSIS — Z170 Estrogen receptor positive status [ER+]: Secondary | ICD-10-CM

## 2014-04-24 LAB — CBC WITH DIFFERENTIAL/PLATELET
BASO%: 0.9 % (ref 0.0–2.0)
Basophils Absolute: 0 10*3/uL (ref 0.0–0.1)
EOS ABS: 0.1 10*3/uL (ref 0.0–0.5)
EOS%: 4.1 % (ref 0.0–7.0)
HCT: 38 % (ref 34.8–46.6)
HGB: 12.6 g/dL (ref 11.6–15.9)
LYMPH#: 0.8 10*3/uL — AB (ref 0.9–3.3)
LYMPH%: 25.3 % (ref 14.0–49.7)
MCH: 30.1 pg (ref 25.1–34.0)
MCHC: 33.2 g/dL (ref 31.5–36.0)
MCV: 90.7 fL (ref 79.5–101.0)
MONO#: 0.4 10*3/uL (ref 0.1–0.9)
MONO%: 10.9 % (ref 0.0–14.0)
NEUT%: 58.8 % (ref 38.4–76.8)
NEUTROS ABS: 1.9 10*3/uL (ref 1.5–6.5)
Platelets: 277 10*3/uL (ref 145–400)
RBC: 4.19 10*6/uL (ref 3.70–5.45)
RDW: 12.8 % (ref 11.2–14.5)
WBC: 3.2 10*3/uL — ABNORMAL LOW (ref 3.9–10.3)

## 2014-04-24 MED ORDER — DIPHENHYDRAMINE HCL 25 MG PO CAPS
25.0000 mg | ORAL_CAPSULE | Freq: Once | ORAL | Status: AC
  Administered 2014-04-24: 25 mg via ORAL

## 2014-04-24 MED ORDER — TRASTUZUMAB CHEMO INJECTION 440 MG
6.0000 mg/kg | Freq: Once | INTRAVENOUS | Status: AC
  Administered 2014-04-24: 567 mg via INTRAVENOUS
  Filled 2014-04-24: qty 27

## 2014-04-24 MED ORDER — SODIUM CHLORIDE 0.9 % IV SOLN
Freq: Once | INTRAVENOUS | Status: AC
  Administered 2014-04-24: 10:00:00 via INTRAVENOUS

## 2014-04-24 MED ORDER — DEXAMETHASONE SODIUM PHOSPHATE 10 MG/ML IJ SOLN
4.0000 mg | Freq: Once | INTRAMUSCULAR | Status: AC
  Administered 2014-04-24: 4 mg via INTRAVENOUS

## 2014-04-24 MED ORDER — DIPHENHYDRAMINE HCL 25 MG PO CAPS
ORAL_CAPSULE | ORAL | Status: AC
  Filled 2014-04-24: qty 1

## 2014-04-24 MED ORDER — DEXAMETHASONE SODIUM PHOSPHATE 10 MG/ML IJ SOLN
INTRAMUSCULAR | Status: AC
  Filled 2014-04-24: qty 1

## 2014-04-24 NOTE — Telephone Encounter (Signed)
per pof to sch pt appt-gave pt copy of sch °

## 2014-04-24 NOTE — Patient Instructions (Signed)
[facility] Discharge Instructions for Patients Receiving Chemotherapy  Today you received the following chemotherapy agents herceptin   To help prevent nausea and vomiting after your treatment, we encourage you to take your nausea medication as directed   If you develop nausea and vomiting that is not controlled by your nausea medication, call the clinic.   BELOW ARE SYMPTOMS THAT SHOULD BE REPORTED IMMEDIATELY:  *FEVER GREATER THAN 100.5 F  *CHILLS WITH OR WITHOUT FEVER  NAUSEA AND VOMITING THAT IS NOT CONTROLLED WITH YOUR NAUSEA MEDICATION  *UNUSUAL SHORTNESS OF BREATH  *UNUSUAL BRUISING OR BLEEDING  TENDERNESS IN MOUTH AND THROAT WITH OR WITHOUT PRESENCE OF ULCERS  *URINARY PROBLEMS  *BOWEL PROBLEMS  UNUSUAL RASH Items with * indicate a potential emergency and should be followed up as soon as possible.  Feel free to call the clinic you have any questions or concerns. The clinic phone number is [phone].  

## 2014-04-24 NOTE — Progress Notes (Signed)
[place]  [phone] [phone]     ID: Elizabeth Miles DOB: 1965/09/19  MR#: [id number]  KNL#:[id number]  Patient Care Team: Clinton Quant, MD as PCP - General (Internal Medicine) Clinton Quant, MD as Referring Physician (Internal Medicine) Rolm Bookbinder, MD as Consulting Physician (General Surgery) Chauncey Cruel, MD as Consulting Physician (Oncology) Thea Silversmith, MD as Consulting Physician (Radiation Oncology) San Morelle, MD as Consulting Physician (Gynecology)   CHIEF COMPLAINT: right breat cancer CURRENT TREATMENT: trastuzumab q 3 weeks, tamoxifen daily  BREAST CANCER HISTORY: From Dr. Dana Allan earlier notes:  "Elizabeth Miles is a 49 y.o. female. Without significant past medical history who on June 2013 had a mammogram that was normal. But there was on physical exam possibility of a cyst noted in the right breast. The mammogram was negative. In 2014 June patient noted on exam another lump in the right breast. She underwent a diagnostic mammogram on June 10 that showed a right breast nodule in the outer quadrant. She had an ultrasound performed that showed at the 9:30 o'clock position a 3.6 cm area and then added 10:00 position 1.3 cm area with a total area being anywhere between 5-6 cm. The patient went on to have a right breast biopsy performed in [place]. The pathology revealed an invasive ductal carcinoma. This is been confirmed by our pathology as well. The carcinoma and papillary features and was felt to be between a grade 1 and 2. The tumor was estrogen receptor positive strongly (100%) progesterone receptor negative HER-2/neu negative with a Ki-67 that showed a high proliferation rate. Patient is now seen in medical oncology for discussion of treatment options."  Bilateral breast MRI on 08/27/2012 revealed in the right upper quadrant irregular lobulated mass with a satellite nodule or lobulation within 2 mm at its  superior aspect, measured together as 4.5 x 4.0 x 3.8 cm. There was extension of enhancement to the nipple, suggesting nipple involvement may be present. No lymphadenopathy was noted there was no any other area of abnormal enhancement in either breast (clinical stage IIA, T2 N0).  PET scan performed on 08/29/2012 revealed the primary breast cancer measuring 3.2 x 3.3 cm with SUV of 16. It was adjacent nodule along the superiomedial border of the primary mass measuring 1.4 cm which was also hypermetabolic. There were no additional areas of abnormal hypermetabolism. No abnormal hypermetabolic activity was seen in the chest, abdomen/pelvis,within the liver, pancreas, adrenal glands or spleen. No hypermetabolic lymph nodes. In the skeleton, no focal hypermetabolic activity to suggest skeletal metastasis was seen.  Completed neoadjuvant chemotherapy consisting of Q14 day Adriamycin/Cytoxan x 4 cycles on 10/25/2012, followed by one dose of neoadjuvant Taxol on 11/08/2012. She developed significant grade 2 neuropathy and Taxol was discontinued.  Then received neoadjuvant chemotherapy consisting of single agent Abraxane given on day 1, 8, 15 of each 28 day cycle. She completed therapy on 11/15/12 - 02/14/13.   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 Repeat HER-2 testing was now positive. The patient was started on tamoxifen March 2015 and on Herceptin the same month.  Her subsequent history is as detailed below.  INTERVAL HISTORY: Elizabeth Miles returns to clinic today for follow up of her breast cancer, accompanied by her mother. She continues with tamoxifen daily and trastuzumab every 3 weeks. She is tolerating these treatments well.  She has some hot flashes and joint aches, but denies vaginal changes. She continues to have mild left facial numbness after her  trastuzumab infusions, but this is greatly reduced with the use of zyrtec before treatment and nightly for a few days afterwards. She has been prescribed gabapentin by Neurology for this issue previously, but prefers not to take it.   Elizabeth Miles visited with Dr. Erik Obey last month to follow up with her right ear discomfort. No cause was able to be elicited through a battery of tests. Her hearing and eardrum were both completely normal. Her left ear only missed one high pitch during evaluation as well. She is happy about the good reports, but the lack of information is troubling.  REVIEW OF SYSTEMS: Elizabeth Miles denies fevers, chills, nausea, vomiting, or changes in bowel or bladder habits. She has not shortness of breath, chest pain, palpitations, cough, or fatigue. She has some chronic heart burn managed with dexlansoprazole. She endorses anxiety and depression, especially as she knows she is finishing up with trastuzumab treatments. Additionally she turned 49 two weeks ago and is afraid of aging. A detailed review of systems is otherwise noncontributory.   PAST MEDICAL HISTORY: Past Medical History  Diagnosis Date  . 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 GERD (gastroesophageal reflux disease)   . GERD (gastroesophageal reflux disease) 08/22/2012  . Allergy   . Complication of anesthesia     1986 ; problem waking up  . Anxiety   . Wears glasses     PAST SURGICAL HISTORY: Past Surgical History  Procedure Laterality Date  . Dilation and curettage of uterus  1993  . Cervical ablation  1983  . Breast biopsy Right 08/06/12  . Popliteal synovial cyst excision  1970  . Portacath placement Left 09/12/2012    Procedure: INSERTION PORT-A-CATH;  Surgeon: Rolm Bookbinder, MD;  Location: [place];  Service: General;  Laterality: Left;  . Breast lumpectomy with needle localization and axillary sentinel lymph node bx Right 03/10/2013    Procedure: BREAST LUMPECTOMY WITH NEEDLE LOCALIZATION AND  AXILLARY SENTINEL LYMPH NODE BIOPSY;  Surgeon: Rolm Bookbinder, MD;  Location: [place];  Service: General;  Laterality: Right;  . Port-a-cath removal Left 03/10/2013    Procedure: REMOVAL PORT-A-CATH;  Surgeon: Rolm Bookbinder, MD;  Location: [place];  Service: General;  Laterality: Left;    FAMILY HISTORY Family History  Problem Relation Age of Onset  . Hypertension Mother   . Bladder Cancer Maternal Uncle 68  . Cancer Paternal Grandmother 29    lung cancer  . Lung cancer Paternal Grandfather     dx in his 28s  . COPD Paternal Uncle    the patient's parents are living and in good health. The patient has one brother, no sisters. There is no history of breast or ovarian cancer in the family to her knowledge  GYNECOLOGIC HISTORY:  Patient's last menstrual period was 03/12/2012. Menarche age 16, first live birth age 89. The patient is GX P3. 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 Her husband, Jori Moll, is a Tour manager. As daughter Lovena Le, 65, lives in [place] and daughter Caryl Pina just had a baby girl, the patient's first grandchild. Son Jori Moll "Carlynn Spry" is 17 and at home. The patient is a Psychologist, forensic.    ADVANCED DIRECTIVES:    HEALTH MAINTENANCE: History  Substance Use Topics  . Smoking status: Never Smoker   . Smokeless tobacco: Never Used  . Alcohol Use: No     Colonoscopy:  PAP:  Bone density:  Lipid panel:  Allergies  Allergen Reactions  . Darvocet [Propoxyphene N-Acetaminophen] Shortness Of Breath and Swelling  . Shellfish Allergy Shortness Of Breath and Swelling  . Other     CT dye, "chest hurt" "makes me feel cold"    Current Outpatient Prescriptions  Medication Sig Dispense Refill  . cetirizine (ZYRTEC) 10 MG tablet Take 10 mg by mouth daily.    Marland Kitchen Dexlansoprazole  (DEXILANT) 30 MG capsule Take 1 capsule (30 mg total) by mouth daily. 90 capsule 4  . ibuprofen (ADVIL,MOTRIN) 800 MG tablet Take 800 mg by mouth every 8 (eight) hours as needed for moderate pain (for back pain).    . tamoxifen (NOLVADEX) 20 MG tablet Take 1 tablet (20 mg total) by mouth daily. 90 tablet 3  . acetaminophen (TYLENOL) 500 MG tablet Take 1,000 mg by mouth every 6 (six) hours as needed.    . gabapentin (NEURONTIN) 100 MG capsule Take as needed for facial tingling, once daily (Patient not taking: Reported on 02/23/2014) 30 capsule 3   No current facility-administered medications for this visit.    OBJECTIVE: Middle-aged white woman in no acute distress Filed Vitals:   04/24/14 0904  BP: 139/83  Pulse: 94  Temp: 98.5 F (36.9 C)  Resp: 18     Body mass index is 33.71 kg/(m^2).    ECOG FS:1 - Symptomatic but completely ambulatory  Skin: warm, dry  HEENT: sclerae anicteric, conjunctivae pink, oropharynx clear. No thrush or mucositis. TMs normal bilaterally Lymph Nodes: No cervical or supraclavicular lymphadenopathy  Lungs: clear to auscultation bilaterally, no rales, wheezes, or rhonci  Heart: regular rate and rhythm  Abdomen: round, soft, non tender, positive bowel sounds  Musculoskeletal: No focal spinal tenderness, no peripheral edema  Neuro: non focal, well oriented, positive affect  Breasts: right breast status post lumpectomy and radiation. Mildly tender with palpation. No evidence of recurrent disease. Right axilla benign. Left breast unremarkable.   LAB RESULTS:  CMP     Component Value Date/Time   NA 141 02/23/2014 0829   NA 144 08/17/2013 0346   K 3.7 02/23/2014 0829   K 3.8 08/17/2013 0346   CL 105 08/17/2013 0346   CO2 26 02/23/2014 0829   GLUCOSE 83 02/23/2014 0829   GLUCOSE 155* 08/17/2013 0346   BUN 12.6 02/23/2014 0829   BUN 8 08/17/2013 0346   CREATININE 0.8 02/23/2014 0829   CREATININE 0.80 08/17/2013 0346   CALCIUM 9.4 02/23/2014 0829   PROT  7.3 02/23/2014 0829   ALBUMIN 3.9 02/23/2014 0829   AST 17 02/23/2014 0829   ALT 17 02/23/2014 0829   ALKPHOS 70 02/23/2014 0829   BILITOT 0.30 02/23/2014 0829    I No results found for: SPEP  Lab Results  Component Value Date   WBC 3.2* 04/24/2014   NEUTROABS 1.9 04/24/2014   HGB 12.6 04/24/2014   HCT 38.0 04/24/2014   MCV 90.7 04/24/2014   PLT 277 04/24/2014      Chemistry      Component Value Date/Time   NA 141 02/23/2014 0829   NA 144 08/17/2013 0346   K 3.7 02/23/2014 0829   K 3.8 08/17/2013 0346   CL 105 08/17/2013 0346   CO2 26 02/23/2014 0829   BUN 12.6 02/23/2014 0829   BUN 8 08/17/2013 0346  CREATININE 0.8 02/23/2014 0829   CREATININE 0.80 08/17/2013 0346      Component Value Date/Time   CALCIUM 9.4 02/23/2014 0829   ALKPHOS 70 02/23/2014 0829   AST 17 02/23/2014 0829   ALT 17 02/23/2014 0829   BILITOT 0.30 02/23/2014 0829       No results found for: LABCA2  No components found for: LABCA125  No results for input(s): INR in the last 168 hours.  Urinalysis    Component Value Date/Time   LABSPEC 1.010 12/13/2012 1435   GLUCOSEU Negative 12/13/2012 1435   UROBILINOGEN 0.2 12/13/2012 1435    STUDIES: Most recent echocardiogram on 12/23/13 showed an ejection fraction of 55-60% Most recent mammogram late last year, need to obtain results from [place]: 49 y.o. [place] woman  (1) status post right breast biopsy 08/06/2012 for a clinical mT2 N0, stage IIA invasive ductal carcinoma, estrogen receptor 3+ positive, progesterone receptor and HER-2 negative, Ki67 3+ [[id number]]  (2) treated neoadjuvant [place] dose dense cyclophosphamide and doxorubicin x4, completed 10/25/2012, followed by a single dose of weekly paclitaxel, poorly tolerated; followed by 11 doses of Abraxane completed 02/14/2013  (3) status post right lumpectomy and sentinel lymph node sampling 03/10/2013 for a pT2 pN0, stage IIA invasive ductal carcinoma,  grade 2, estrogen receptor 100% positive, progesterone receptor 21% positive, with an MIB-1 of 14%, and HER-2 amplified, the signals ratio being 2.52, the number per cell 2.90  (4) adjuvant radiation completed March 2015 (?)  (5) started tamoxifen March 2015  (6) started trastuzumab 05/23/2013; echocardiogram 12/23/13 shows a normal ejection fraction   PLAN: Elizabeth Miles is physically doing well today, but has some anxiety about finishing up trastuzumab. I explained to her that she will be on tamoxfien and will get anti-estrogen coverage for a full 10 years. The labs were reviewed in detail and were entirely stable. She had an echocardiogram earlier this month that showed a well preserved ejection fraction.   Elizabeth Miles will have one last trastuzumab treatment in March. She will have a follow up visit with Dr. Marquis Buggy in May to discuss long term follow up. She understands and agrees with this plan. She knows the goal of treatment in her case is cure. She has been encouraged to call with any issues that might arise before her next visit here.   Marcelino Duster, NP   04/24/2014 10:09 AM

## 2014-05-15 ENCOUNTER — Other Ambulatory Visit (HOSPITAL_BASED_OUTPATIENT_CLINIC_OR_DEPARTMENT_OTHER): Payer: BLUE CROSS/BLUE SHIELD

## 2014-05-15 ENCOUNTER — Ambulatory Visit (HOSPITAL_BASED_OUTPATIENT_CLINIC_OR_DEPARTMENT_OTHER): Payer: BLUE CROSS/BLUE SHIELD

## 2014-05-15 DIAGNOSIS — Z5112 Encounter for antineoplastic immunotherapy: Secondary | ICD-10-CM

## 2014-05-15 DIAGNOSIS — C50411 Malignant neoplasm of upper-outer quadrant of right female breast: Secondary | ICD-10-CM

## 2014-05-15 DIAGNOSIS — C50919 Malignant neoplasm of unspecified site of unspecified female breast: Secondary | ICD-10-CM

## 2014-05-15 LAB — COMPREHENSIVE METABOLIC PANEL (CC13)
ALT: 19 U/L (ref 0–55)
AST: 20 U/L (ref 5–34)
Albumin: 3.8 g/dL (ref 3.5–5.0)
Alkaline Phosphatase: 68 U/L (ref 40–150)
Anion Gap: 11 mEq/L (ref 3–11)
BUN: 11.7 mg/dL (ref 7.0–26.0)
CHLORIDE: 108 meq/L (ref 98–109)
CO2: 23 mEq/L (ref 22–29)
Calcium: 9.5 mg/dL (ref 8.4–10.4)
Creatinine: 0.7 mg/dL (ref 0.6–1.1)
EGFR: >90 mL/min/{1.73_m2} (ref >90)
Glucose: 94 mg/dl (ref 70–140)
Potassium: 4.2 mEq/L (ref 3.5–5.1)
SODIUM: 141 meq/L (ref 136–145)
TOTAL PROTEIN: 7.1 g/dL (ref 6.4–8.3)
Total Bilirubin: 0.44 mg/dL (ref 0.20–1.20)

## 2014-05-15 LAB — CBC WITH DIFFERENTIAL/PLATELET
BASO%: 1.1 % (ref 0.0–2.0)
Basophils Absolute: 0 10*3/uL (ref 0.0–0.1)
EOS%: 4 % (ref 0.0–7.0)
Eosinophils Absolute: 0.2 10*3/uL (ref 0.0–0.5)
HCT: 39.1 % (ref 34.8–46.6)
HGB: 13 g/dL (ref 11.6–15.9)
LYMPH#: 1 10*3/uL (ref 0.9–3.3)
LYMPH%: 27.5 % (ref 14.0–49.7)
MCH: 30.2 pg (ref 25.1–34.0)
MCHC: 33.2 g/dL (ref 31.5–36.0)
MCV: 90.9 fL (ref 79.5–101.0)
MONO#: 0.3 10*3/uL (ref 0.1–0.9)
MONO%: 8.7 % (ref 0.0–14.0)
NEUT#: 2.2 10*3/uL (ref 1.5–6.5)
NEUT%: 58.7 % (ref 38.4–76.8)
Platelets: 245 10*3/uL (ref 145–400)
RBC: 4.3 10*6/uL (ref 3.70–5.45)
RDW: 13 % (ref 11.2–14.5)
WBC: 3.8 10*3/uL — ABNORMAL LOW (ref 3.9–10.3)
nRBC: 0 % (ref 0–0)

## 2014-05-15 MED ORDER — SODIUM CHLORIDE 0.9 % IV SOLN
Freq: Once | INTRAVENOUS | Status: AC
  Administered 2014-05-15: 10:00:00 via INTRAVENOUS

## 2014-05-15 MED ORDER — SODIUM CHLORIDE 0.9 % IV SOLN
4.0000 mg | Freq: Once | INTRAVENOUS | Status: AC
  Administered 2014-05-15: 4 mg via INTRAVENOUS
  Filled 2014-05-15: qty 0.4

## 2014-05-15 MED ORDER — DIPHENHYDRAMINE HCL 25 MG PO CAPS
ORAL_CAPSULE | ORAL | Status: AC
  Filled 2014-05-15: qty 1

## 2014-05-15 MED ORDER — TRASTUZUMAB CHEMO INJECTION 440 MG
6.0000 mg/kg | Freq: Once | INTRAVENOUS | Status: AC
  Administered 2014-05-15: 567 mg via INTRAVENOUS
  Filled 2014-05-15: qty 27

## 2014-05-15 MED ORDER — DIPHENHYDRAMINE HCL 25 MG PO CAPS
25.0000 mg | ORAL_CAPSULE | Freq: Once | ORAL | Status: AC
  Administered 2014-05-15: 25 mg via ORAL

## 2014-05-15 NOTE — Patient Instructions (Signed)
[facility] Discharge Instructions for Patients Receiving Chemotherapy  Today you received the following chemotherapy agents:  Herceptin  To help prevent nausea and vomiting after your treatment, we encourage you to take your nausea medication as prescribed.   If you develop nausea and vomiting that is not controlled by your nausea medication, call the clinic.   BELOW ARE SYMPTOMS THAT SHOULD BE REPORTED IMMEDIATELY:  *FEVER GREATER THAN 100.5 F  *CHILLS WITH OR WITHOUT FEVER  NAUSEA AND VOMITING THAT IS NOT CONTROLLED WITH YOUR NAUSEA MEDICATION  *UNUSUAL SHORTNESS OF BREATH  *UNUSUAL BRUISING OR BLEEDING  TENDERNESS IN MOUTH AND THROAT WITH OR WITHOUT PRESENCE OF ULCERS  *URINARY PROBLEMS  *BOWEL PROBLEMS  UNUSUAL RASH Items with * indicate a potential emergency and should be followed up as soon as possible.  Feel free to call the clinic you have any questions or concerns. The clinic phone number is [phone].  Please show the CHEMO ALERT CARD at check-in to the Emergency Department and triage nurse.   

## 2014-05-24 ENCOUNTER — Emergency Department (HOSPITAL_COMMUNITY)
Admission: EM | Admit: 2014-05-24 | Discharge: 2014-05-24 | Disposition: A | Discharge status: Home / Self Care | Payer: BLUE CROSS/BLUE SHIELD | Attending: Emergency Medicine | Admitting: Emergency Medicine

## 2014-05-24 ENCOUNTER — Encounter (HOSPITAL_COMMUNITY): Payer: Self-pay | Admitting: Emergency Medicine

## 2014-05-24 DIAGNOSIS — C50919 Malignant neoplasm of unspecified site of unspecified female breast: Secondary | ICD-10-CM | POA: Insufficient documentation

## 2014-05-24 DIAGNOSIS — Z792 Long term (current) use of antibiotics: Secondary | ICD-10-CM | POA: Diagnosis not present

## 2014-05-24 DIAGNOSIS — M79602 Pain in left arm: Secondary | ICD-10-CM | POA: Diagnosis not present

## 2014-05-24 DIAGNOSIS — K219 Gastro-esophageal reflux disease without esophagitis: Secondary | ICD-10-CM | POA: Insufficient documentation

## 2014-05-24 DIAGNOSIS — Z79899 Other long term (current) drug therapy: Secondary | ICD-10-CM | POA: Insufficient documentation

## 2014-05-24 DIAGNOSIS — F419 Anxiety disorder, unspecified: Secondary | ICD-10-CM | POA: Diagnosis not present

## 2014-05-24 DIAGNOSIS — M7989 Other specified soft tissue disorders: Secondary | ICD-10-CM | POA: Diagnosis present

## 2014-05-24 NOTE — ED Provider Notes (Signed)
CSN: [id number]     Arrival date & time 05/24/14  1937 History   First MD Initiated Contact with Patient 05/24/14 2109     Chief Complaint  Patient presents with  . Arm Swelling  . Chemo patient      (Consider location/radiation/quality/duration/timing/severity/associated sxs/prior Treatment) Patient is a 49 y.o. female presenting with general illness.  Illness Location:  L arm entirety Quality:  Swelling, pain Severity:  Moderate Onset quality:  Gradual Duration:  5 days Timing:  Constant Progression:  Worsening Chronicity:  New Context:  Receives herceptin injections, last inj 1 week prior to onset of symptoms Relieved by:  Nothing Worsened by:  Palpation, movement Associated symptoms: no abdominal pain, no chest pain, no cough, no fever, no nausea and no vomiting     Past Medical History  Diagnosis Date  . 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 GERD (gastroesophageal reflux disease)   . GERD (gastroesophageal reflux disease) 08/22/2012  . Allergy   . Complication of anesthesia     1986 ; problem waking up  . Anxiety   . Wears glasses    Past Surgical History  Procedure Laterality Date  . Dilation and curettage of uterus  1993  . Cervical ablation  1983  . Breast biopsy Right 08/06/12  . Popliteal synovial cyst excision  1970  . Portacath placement Left 09/12/2012    Procedure: INSERTION PORT-A-CATH;  Surgeon: Rolm Bookbinder, MD;  Location: [place];  Service: General;  Laterality: Left;  . Breast lumpectomy with needle localization and axillary sentinel lymph node bx Right 03/10/2013    Procedure: BREAST LUMPECTOMY WITH NEEDLE LOCALIZATION AND AXILLARY SENTINEL LYMPH NODE BIOPSY;  Surgeon: Rolm Bookbinder, MD;  Location: [place];  Service: General;  Laterality: Right;  . Port-a-cath removal Left 03/10/2013    Procedure: REMOVAL PORT-A-CATH;  Surgeon: Rolm Bookbinder, MD;  Location: [place];  Service: General;   Laterality: Left;   Family History  Problem Relation Age of Onset  . Hypertension Mother   . Bladder Cancer Maternal Uncle 68  . Cancer Paternal Grandmother 64    lung cancer  . Lung cancer Paternal Grandfather     dx in his 6s  . COPD Paternal Uncle    History  Substance Use Topics  . Smoking status: Never Smoker   . Smokeless tobacco: Never Used  . Alcohol Use: No   OB History    No data available     Review of Systems  Constitutional: Negative for fever.  Respiratory: Negative for cough.   Cardiovascular: Negative for chest pain.  Gastrointestinal: Negative for nausea, vomiting and abdominal pain.  All other systems reviewed and are negative.     Allergies  Darvocet; Shellfish allergy; and Other  Home Medications   Prior to Admission medications   Medication Sig Start Date End Date Taking? Authorizing Provider  acetaminophen (TYLENOL) 500 MG tablet Take 1,000 mg by mouth every 6 (six) hours as needed.   Yes Historical Provider, MD  cephALEXin (KEFLEX) 500 MG capsule Take 1,000 mg by mouth 2 (two) times daily. 05/16/14  Yes Historical Provider, MD  cetirizine (ZYRTEC) 10 MG tablet Take 10 mg by mouth daily.   Yes Historical Provider, MD  Dexlansoprazole (DEXILANT) 30 MG capsule Take 1 capsule (30 mg total) by mouth daily. 12/16/12  Yes Minette Headland, NP  ibuprofen (ADVIL,MOTRIN) 800 MG tablet Take 800 mg by mouth every 8 (eight) hours as needed for moderate pain (  for back pain).   Yes Historical Provider, MD  montelukast (SINGULAIR) 10 MG tablet Take 1 tablet by mouth at bedtime. 05/16/14  Yes Historical Provider, MD  tamoxifen (NOLVADEX) 20 MG tablet Take 1 tablet (20 mg total) by mouth daily. 05/23/13  Yes Amada Kingfisher, MD  gabapentin (NEURONTIN) 100 MG capsule Take as needed for facial tingling, once daily Patient not taking: Reported on 02/23/2014 08/26/13   Star Age, MD   BP 146/87 mmHg  Pulse 97  Temp(Src) 98.1 F (36.7 C) (Oral)  Resp 14  SpO2 99%   LMP 03/12/2012 Physical Exam  Constitutional: She is oriented to person, place, and time. She appears well-developed and well-nourished.  HENT:  Head: Normocephalic and atraumatic.  Right Ear: External ear normal.  Left Ear: External ear normal.  Eyes: Conjunctivae and EOM are normal. Pupils are equal, round, and reactive to light.  Neck: Normal range of motion. Neck supple.  Cardiovascular: Normal rate, regular rhythm, normal heart sounds and intact distal pulses.   Pulmonary/Chest: Effort normal and breath sounds normal.  Abdominal: Soft. Bowel sounds are normal. There is no tenderness.  Musculoskeletal: Normal range of motion.  No appreciable swelling of L arm, pulses 2+, tenderness over arm entirety  Neurological: She is alert and oriented to person, place, and time.  Skin: Skin is warm and dry.  Vitals reviewed.   ED Course  Procedures (including critical care time) Labs Review Labs Reviewed - No data to display  Imaging Review No results found.   EKG Interpretation None      MDM   Final diagnoses:  Pain of left upper extremity    49 y.o. female with pertinent PMH of breast ca, sp herceptin and tamoxifen presents with L arm swelling and pain.  She is concerned specifically about DVT.  No chest pain or dyspnea.  Otherwise asymptomatic.  Exam benign with exception of tenderness.  No sensory loss, 2+ pulse.  Discussed anticoagulation tonight and [place] tomorrow AM ([place] not available now), pt refused anticoagulation, she understands risks.  DC home in stable condition  I have reviewed all laboratory and imaging studies if ordered as above  1. Pain of left upper extremity         Debby Freiberg, MD 05/24/14 2242

## 2014-05-24 NOTE — ED Notes (Signed)
Pt had her last herceptin tx for breast CA on 3/18 and now is having mild swelling and pain in L arm. Pt is also on tamoxifen. 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 Alert and oriented.

## 2014-05-24 NOTE — Discharge Instructions (Signed)
Radicular Pain Radicular pain in either the arm or leg is usually from a bulging or herniated disk in the spine. A piece of the herniated disk may press against the nerves as the nerves exit the spine. This causes pain which is felt at the tips of the nerves down the arm or leg. Other causes of radicular pain may include:  Fractures.  Heart disease.  Cancer.  An abnormal and usually degenerative state of the nervous system or nerves (neuropathy). Diagnosis may require CT or MRI scanning to determine the primary cause.  Nerves that start at the neck (nerve roots) may cause radicular pain in the outer shoulder and arm. It can spread down to the thumb and fingers. The symptoms vary depending on which nerve root has been affected. In most cases radicular pain improves with conservative treatment. Neck problems may require physical therapy, a neck collar, or cervical traction. Treatment may take many weeks, and surgery may be considered if the symptoms do not improve.  Conservative treatment is also recommended for sciatica. Sciatica causes pain to radiate from the lower back or buttock area down the leg into the foot. Often there is a history of back problems. Most patients with sciatica are better after 2 to 4 weeks of rest and other supportive care. Short term bed rest can reduce the disk pressure considerably. Sitting, however, is not a good position since this increases the pressure on the disk. You should avoid bending, lifting, and all other activities which make the problem worse. Traction can be used in severe cases. Surgery is usually reserved for patients who do not improve within the first months of treatment. Only take over-the-counter or prescription medicines for pain, discomfort, or fever as directed by your caregiver. Narcotics and muscle relaxants may help by relieving more severe pain and spasm and by providing mild sedation. Cold or massage can give significant relief. Spinal manipulation  is not recommended. It can increase the degree of disc protrusion. Epidural steroid injections are often effective treatment for radicular pain. These injections deliver medicine to the spinal nerve in the space between the protective covering of the spinal cord and back bones (vertebrae). Your caregiver can give you more information about steroid injections. These injections are most effective when given within two weeks of the onset of pain.  You should see your caregiver for follow up care as recommended. A program for neck and back injury rehabilitation with stretching and strengthening exercises is an important part of management.  SEEK IMMEDIATE MEDICAL CARE IF:  You develop increased pain, weakness, or numbness in your arm or leg.  You develop difficulty with bladder or bowel control.  You develop abdominal pain. Document Released: 03/23/2004 Document Revised: 05/08/2011 Document Reviewed: 06/08/2008 [facility] Patient Information 2015 [place], [place]. This information is not intended to replace advice given to you by your health care provider. Make sure you discuss any questions you have with your health care provider. Deep Vein Thrombosis A deep vein thrombosis (DVT) is a blood clot that develops in the deep, larger veins of the leg, arm, or pelvis. These are more dangerous than clots that might form in veins near the surface of the body. A DVT can lead to serious and even life-threatening complications if the clot breaks off and travels in the bloodstream to the lungs.  A DVT can damage the valves in your leg veins so that instead of flowing upward, the blood pools in the lower leg. This is called post-thrombotic syndrome, and  it can result in pain, swelling, discoloration, and sores on the leg. CAUSES Usually, several things contribute to the formation of blood clots. Contributing factors include:  The flow of blood slows down.  The inside of the vein is damaged in some way.  You have a  condition that makes blood clot more easily. RISK FACTORS Some people are more likely than others to develop blood clots. Risk factors include:   Smoking.  Being overweight (obese).  Sitting or lying still for a long time. This includes long-distance travel, paralysis, or recovery from an illness or surgery. Other factors that increase risk are:   Older age, especially over 88 years of age.  Having a family history of blood clots or if you have already had a blot clot.  Having major or lengthy surgery. This is especially true for surgery on the hip, knee, or belly (abdomen). Hip surgery is particularly high risk.  Having a long, thin tube (catheter) placed inside a vein during a medical procedure.  Breaking a hip or leg.  Having cancer or cancer treatment.  Pregnancy and childbirth.  Hormone changes make the blood clot more easily during pregnancy.  The fetus puts pressure on the veins of the pelvis.  There is a risk of injury to veins during delivery or a caesarean delivery. The risk is highest just after childbirth.  Medicines containing the female hormone estrogen. This includes birth control pills and hormone replacement therapy.  Other circulation or heart problems.  SIGNS AND SYMPTOMS When a clot forms, it can either partially or totally block the blood flow in that vein. Symptoms of a DVT can include:  Swelling of the leg or arm, especially if one side is much worse.  Warmth and redness of the leg or arm, especially if one side is much worse.  Pain in an arm or leg. If the clot is in the leg, symptoms may be more noticeable or worse when standing or walking. The symptoms of a DVT that has traveled to the lungs (pulmonary embolism, PE) usually start suddenly and include:  Shortness of breath.  Coughing.  Coughing up blood or blood-tinged mucus.  Chest pain. The chest pain is often worse with deep breaths.  Rapid heartbeat. Anyone with these symptoms should  get emergency medical treatment right away. Do not wait to see if the symptoms will go away. Call your local emergency services (911 in the [place] if you have these symptoms. Do not drive yourself to the hospital. DIAGNOSIS If a DVT is suspected, your health care provider will take a full medical history and perform a physical exam. Tests that also may be required include:  Blood tests, including studies of the clotting properties of the blood.  Ultrasound to see if you have clots in your legs or lungs.  X-rays to show the flow of blood when dye is injected into the veins (venogram).  Studies of your lungs if you have any chest symptoms. PREVENTION  Exercise the legs regularly. Take a brisk 30-minute walk every day.  Maintain a weight that is appropriate for your height.  Avoid sitting or lying in bed for long periods of time without moving your legs.  Women, particularly those over the age of 69 years, should consider the risks and benefits of taking estrogen medicines, including birth control pills.  Do not smoke, especially if you take estrogen medicines.  Long-distance travel can increase your risk of DVT. You should exercise your legs by walking or pumping the  muscles every hour.  Many of the risk factors above relate to situations that exist with hospitalization, either for illness, injury, or elective surgery. Prevention may include medical and nonmedical measures.  Your health care provider will assess you for the need for venous thromboembolism prevention when you are admitted to the hospital. If you are having surgery, your surgeon will assess you the day of or day after surgery. TREATMENT Once identified, a DVT can be treated. It can also be prevented in some circumstances. Once you have had a DVT, you may be at increased risk for a DVT in the future. The most common treatment for DVT is blood-thinning (anticoagulant) medicine, which reduces the blood's tendency to clot.  Anticoagulants can stop new blood clots from forming and stop old clots from growing. They cannot dissolve existing clots. Your body does this by itself over time. Anticoagulants can be given by mouth, through an IV tube, or by injection. Your health care provider will determine the best program for you. Other medicines or treatments that may be used are:  Heparin or related medicines (low molecular weight heparin) are often the first treatment for a blood clot. They act quickly. However, they cannot be taken orally and must be given either in shot form or by IV tube.  Heparin can cause a fall in a component of blood that stops bleeding and forms blood clots (platelets). You will be monitored with blood tests to be sure this does not occur.  Warfarin is an anticoagulant that can be swallowed. It takes a few days to start working, so usually heparin or related medicines are used in combination. Once warfarin is working, heparin is usually stopped.  Factor Xa inhibitor medicines, such as rivaroxaban and apixaban, also reduce blood clotting. These medicines are taken orally and can often be used without heparin or related medicines.  Less commonly, clot dissolving drugs (thrombolytics) are used to dissolve a DVT. They carry a high risk of bleeding, so they are used mainly in severe cases where your life or a part of your body is threatened.  Very rarely, a blood clot in the leg needs to be removed surgically.  If you are unable to take anticoagulants, your health care provider may arrange for you to have a filter placed in a main vein in your abdomen. This filter prevents clots from traveling to your lungs. HOME CARE INSTRUCTIONS  Take all medicines as directed by your health care provider.  Learn as much as you can about DVT.  Wear a medical alert bracelet or carry a medical alert card.  Ask your health care provider how soon you can go back to normal activities. It is important to stay active  to prevent blood clots. If you are on anticoagulant medicine, avoid contact sports.  It is very important to exercise. This is especially important while traveling, sitting, or standing for long periods of time. Exercise your legs by walking or by tightening and relaxing your leg muscles regularly. Take frequent walks.  You may need to wear compression stockings. These are tight elastic stockings that apply pressure to the lower legs. This pressure can help keep the blood in the legs from clotting. Taking Warfarin Warfarin is a daily medicine that is taken by mouth. Your health care provider will advise you on the length of treatment (usually 3-6 months, sometimes lifelong). If you take warfarin:  Understand how to take warfarin and foods that can affect how warfarin works in Veterinary surgeon.  Too  much and too little warfarin are both dangerous. Too much warfarin increases the risk of bleeding. Too little warfarin continues to allow the risk for blood clots. Warfarin and Regular Blood Testing While taking warfarin, you will need to have regular blood tests to measure your blood clotting time. These blood tests usually include both the prothrombin time (PT) and international normalized ratio (INR) tests. The PT and INR results allow your health care provider to adjust your dose of warfarin. It is very important that you have your PT and INR tested as often as directed by your health care provider.  Warfarin and Your Diet Avoid major changes in your diet, or notify your health care provider before changing your diet. Arrange a visit with a registered dietitian to answer your questions. Many foods, especially foods high in vitamin K, can interfere with warfarin and affect the PT and INR results. You should eat a consistent amount of foods high in vitamin K. Foods high in vitamin K include:   Spinach, kale, broccoli, cabbage, collard and turnip greens, Brussels sprouts, peas, cauliflower, seaweed, and  parsley.  Beef and pork liver.  Green tea.  Soybean oil. Warfarin with Other Medicines Many medicines can interfere with warfarin and affect the PT and INR results. You must:  Tell your health care provider about any and all medicines, vitamins, and supplements you take, including aspirin and other over-the-counter anti-inflammatory medicines. Be especially cautious with aspirin and anti-inflammatory medicines. Ask your health care provider before taking these.  Do not take or discontinue any prescribed or over-the-counter medicine except on the advice of your health care provider or pharmacist. Warfarin Side Effects Warfarin can have side effects, such as easy bruising and difficulty stopping bleeding. Ask your health care provider or pharmacist about other side effects of warfarin. You will need to:  Hold pressure over cuts for longer than usual.  Notify your dentist and other health care providers that you are taking warfarin before you undergo any procedures where bleeding may occur. Warfarin with Alcohol and Tobacco   Drinking alcohol frequently can increase the effect of warfarin, leading to excess bleeding. It is best to avoid alcoholic drinks or to consume only very small amounts while taking warfarin. Notify your health care provider if you change your alcohol intake.   Do not use any tobacco products including cigarettes, chewing tobacco, or electronic cigarettes. If you smoke, quit. Ask your health care provider for help with quitting smoking. Alternative Medicines to Warfarin: Factor Xa Inhibitor Medicines  These blood-thinning medicines are taken by mouth, usually for several weeks or longer. It is important to take the medicine every single day at the same time each day.  There are no regular blood tests required when using these medicines.  There are fewer food and drug interactions than with warfarin.  The side effects of this class of medicine are similar to those of  warfarin, including excessive bruising or bleeding. Ask your health care provider or pharmacist about other potential side effects. SEEK MEDICAL CARE IF:  You notice a rapid heartbeat.  You feel weaker or more tired than usual.  You feel faint.  You notice increased bruising.  You feel your symptoms are not getting better in the time expected.  You believe you are having side effects of medicine. SEEK IMMEDIATE MEDICAL CARE IF:  You have chest pain.  You have trouble breathing.  You have new or increased swelling or pain in one leg.  You cough up blood.  You notice blood in vomit, in a bowel movement, or in urine. MAKE SURE YOU:  Understand these instructions.  Will watch your condition.  Will get help right away if you are not doing well or get worse. Document Released: 02/13/2005 Document Revised: 06/30/2013 Document Reviewed: 10/21/2012 [facility] Patient Information 2015 [place], [place]. This information is not intended to replace advice given to you by your health care provider. Make sure you discuss any questions you have with your health care provider.

## 2014-05-24 NOTE — ED Notes (Signed)
Attempted to draw labs from Patient's left hand x1 unsuccessfully.

## 2014-05-25 ENCOUNTER — Other Ambulatory Visit: Payer: Self-pay | Admitting: Nurse Practitioner

## 2014-05-25 ENCOUNTER — Ambulatory Visit (HOSPITAL_COMMUNITY)
Admission: AD | Admit: 2014-05-25 | Discharge: 2014-05-25 | Disposition: A | Discharge status: Home / Self Care | Payer: BLUE CROSS/BLUE SHIELD | Source: Ambulatory Visit | Attending: Emergency Medicine | Admitting: Emergency Medicine

## 2014-05-25 ENCOUNTER — Telehealth: Payer: Self-pay | Admitting: *Deleted

## 2014-05-25 ENCOUNTER — Other Ambulatory Visit: Payer: Self-pay | Admitting: *Deleted

## 2014-05-25 ENCOUNTER — Other Ambulatory Visit: Payer: Self-pay | Admitting: Oncology

## 2014-05-25 ENCOUNTER — Ambulatory Visit (HOSPITAL_BASED_OUTPATIENT_CLINIC_OR_DEPARTMENT_OTHER): Payer: BLUE CROSS/BLUE SHIELD | Admitting: Nurse Practitioner

## 2014-05-25 ENCOUNTER — Telehealth: Payer: Self-pay | Admitting: Oncology

## 2014-05-25 ENCOUNTER — Ambulatory Visit (HOSPITAL_COMMUNITY)
Admission: RE | Admit: 2014-05-25 | Discharge: 2014-05-25 | Disposition: A | Discharge status: Home / Self Care | Payer: BLUE CROSS/BLUE SHIELD | Source: Ambulatory Visit | Attending: Emergency Medicine | Admitting: Emergency Medicine

## 2014-05-25 VITALS — BP 144/82 | HR 79 | Temp 98.2°F | Wt 218.5 lb

## 2014-05-25 DIAGNOSIS — I82B12 Acute embolism and thrombosis of left subclavian vein: Secondary | ICD-10-CM | POA: Diagnosis not present

## 2014-05-25 DIAGNOSIS — F419 Anxiety disorder, unspecified: Secondary | ICD-10-CM | POA: Diagnosis not present

## 2014-05-25 DIAGNOSIS — C50411 Malignant neoplasm of upper-outer quadrant of right female breast: Secondary | ICD-10-CM

## 2014-05-25 DIAGNOSIS — I82890 Acute embolism and thrombosis of other specified veins: Secondary | ICD-10-CM

## 2014-05-25 DIAGNOSIS — I82812 Embolism and thrombosis of superficial veins of left lower extremities: Secondary | ICD-10-CM

## 2014-05-25 DIAGNOSIS — M79609 Pain in unspecified limb: Secondary | ICD-10-CM | POA: Diagnosis not present

## 2014-05-25 NOTE — Telephone Encounter (Signed)
Called and left message with appointments.

## 2014-05-25 NOTE — Telephone Encounter (Signed)
Patient to be seen by Selena Lesser NP due to recent scan that revealed SVT. Patient verbalized understanding.

## 2014-05-25 NOTE — Progress Notes (Signed)
VASCULAR LAB PRELIMINARY  PRELIMINARY  PRELIMINARY  PRELIMINARY  Left upper extremity venous Doppler completed.    Preliminary report:  There is no DVT noted in the left upper extremity. There is SVT noted in the left cephalic vein from wrist to forearm.   Hyman Crossan, RVT 05/25/2014, 10:39 AM

## 2014-05-26 ENCOUNTER — Telehealth: Payer: Self-pay | Admitting: *Deleted

## 2014-05-26 ENCOUNTER — Telehealth: Payer: Self-pay | Admitting: Oncology

## 2014-05-26 NOTE — Telephone Encounter (Signed)
spoke with patient and advised on 3.30 appt.Marland KitchenMarland KitchenMarland KitchenMarland Kitchenpt ok and aware

## 2014-05-26 NOTE — Telephone Encounter (Signed)
Returned pt's call concerning her question about whether she needed to come to appt tomorrow with Towana Badger. I told pt she needs to keep that appt and to be here at 2:15p. Pt also said her left arm at the wrist area was still swollen and a little red but not as much swelling at the left forearm area. I encouraged pt to keep arm elevated with pillows above heart level to decrease swelling and to use a warm compress to alleviate pain. Pt is currently taking 325 mg aspirin daily. Pt verbalized understanding and said she will see [place] tomorrow. Message to be forwarded to Gentry Fitz, NP.

## 2014-05-27 ENCOUNTER — Other Ambulatory Visit (HOSPITAL_BASED_OUTPATIENT_CLINIC_OR_DEPARTMENT_OTHER): Payer: BLUE CROSS/BLUE SHIELD

## 2014-05-27 ENCOUNTER — Telehealth: Payer: Self-pay | Admitting: *Deleted

## 2014-05-27 ENCOUNTER — Encounter: Payer: Self-pay | Admitting: Nurse Practitioner

## 2014-05-27 ENCOUNTER — Ambulatory Visit (HOSPITAL_BASED_OUTPATIENT_CLINIC_OR_DEPARTMENT_OTHER): Payer: BLUE CROSS/BLUE SHIELD | Admitting: Nurse Practitioner

## 2014-05-27 VITALS — BP 161/95 | HR 78 | Temp 97.6°F | Resp 18 | Ht 67.0 in | Wt 218.4 lb

## 2014-05-27 DIAGNOSIS — I82890 Acute embolism and thrombosis of other specified veins: Secondary | ICD-10-CM | POA: Diagnosis not present

## 2014-05-27 DIAGNOSIS — C50411 Malignant neoplasm of upper-outer quadrant of right female breast: Secondary | ICD-10-CM | POA: Diagnosis not present

## 2014-05-27 DIAGNOSIS — M8580 Other specified disorders of bone density and structure, unspecified site: Secondary | ICD-10-CM

## 2014-05-27 LAB — FOLLICLE STIMULATING HORMONE: FSH: 28.6 m[IU]/mL

## 2014-05-27 MED ORDER — ANASTROZOLE 1 MG PO TABS: 1.0000 mg | ORAL_TABLET | Freq: Every day | ORAL | Status: DC

## 2014-05-27 NOTE — Progress Notes (Signed)
  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 We are stopping it. We are starting anastrozole (w/o the usual washout to avoid additional visits in this pt who has to travel here from [place]). If she tolerates it well the plan will be to continue for 5 years with monitoring for osteopenia  I personally saw this patient and performed a substantive portion of this encounter with the listed APP documented above.   Chauncey Cruel, MD Medical Oncology and Hematology [facility] [address] [place], [place] [address] Tel. [phone]    Fax. [phone]

## 2014-05-27 NOTE — Progress Notes (Signed)
[facility]  [phone] [phone]     ID: Elizabeth Miles DOB: 09/18/1965  MR#: [id number]  DXA#:[id number]  Patient Care Team: Clinton Quant, MD as PCP - General (Internal Medicine) Clinton Quant, MD as Referring Physician (Internal Medicine) Rolm Bookbinder, MD as Consulting Physician (General Surgery) Chauncey Cruel, MD as Consulting Physician (Oncology) Thea Silversmith, MD as Consulting Physician (Radiation Oncology) San Morelle, MD as Consulting Physician (Gynecology)   CHIEF COMPLAINT: right breat cancer CURRENT TREATMENT: trastuzumab q 3 weeks, tamoxifen daily  BREAST CANCER HISTORY: From Dr. Dana Allan earlier notes:  "Elizabeth Miles is a 49 y.o. female. Without significant past medical history who on June 2013 had a mammogram that was normal. But there was on physical exam possibility of a cyst noted in the right breast. The mammogram was negative. In 2014 June patient noted on exam another lump in the right breast. She underwent a diagnostic mammogram on June 10 that showed a right breast nodule in the outer quadrant. She had an ultrasound performed that showed at the 9:30 o'clock position a 3.6 cm area and then added 10:00 position 1.3 cm area with a total area being anywhere between 5-6 cm. The patient went on to have a right breast biopsy performed in [place]. The pathology revealed an invasive ductal carcinoma. This is been confirmed by our pathology as well. The carcinoma and papillary features and was felt to be between a grade 1 and 2. The tumor was estrogen receptor positive strongly (100%) progesterone receptor negative HER-2/neu negative with a Ki-67 that showed a high proliferation rate. Patient is now seen in medical oncology for discussion of treatment options."  Bilateral breast MRI on 08/27/2012 revealed in the right upper quadrant irregular lobulated mass with a satellite nodule or lobulation within 2 mm at its  superior aspect, measured together as 4.5 x 4.0 x 3.8 cm. There was extension of enhancement to the nipple, suggesting nipple involvement may be present. No lymphadenopathy was noted there was no any other area of abnormal enhancement in either breast (clinical stage IIA, T2 N0).  PET scan performed on 08/29/2012 revealed the primary breast cancer measuring 3.2 x 3.3 cm with SUV of 16. It was adjacent nodule along the superiomedial border of the primary mass measuring 1.4 cm which was also hypermetabolic. There were no additional areas of abnormal hypermetabolism. No abnormal hypermetabolic activity was seen in the chest, abdomen/pelvis,within the liver, pancreas, adrenal glands or spleen. No hypermetabolic lymph nodes. In the skeleton, no focal hypermetabolic activity to suggest skeletal metastasis was seen.  Completed neoadjuvant chemotherapy consisting of Q14 day Adriamycin/Cytoxan x 4 cycles on 10/25/2012, followed by one dose of neoadjuvant Taxol on 11/08/2012. She developed significant grade 2 neuropathy and Taxol was discontinued.  Then received neoadjuvant chemotherapy consisting of single agent Abraxane given on day 1, 8, 15 of each 28 day cycle. She completed therapy on 11/15/12 - 02/14/13.   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 Repeat HER-2 testing was now positive. The patient was started on tamoxifen March 2015 and on Herceptin the same month.  Her subsequent history is as detailed below.  INTERVAL HISTORY: Elizabeth Miles returns to clinic today for follow up of her breast cancer, accompanied by her mother. She completed her last trastuzumab dose on 3/18, which was given peripherally. One week later on  3/27, she visited the ED with complaints of left arm swelling. A doppler US was performed and a superficial clot was found to her left  cephalic vein from the wrist to the forearm. She visited our symptom management clinic on 3/28 and was told to stop her tamoxifen and being aspirin $RemoveBefor'325mg'[id number]$  daily.   REVIEW OF SYSTEMS: Elizabeth Miles denies fevers, chills, nausea, vomiting, or changes in bowel or bladder habits. She has not shortness of breath, chest pain, palpitations, cough, or fatigue. She has some chronic heart burn managed with dexlansoprazole. She endorses anxiety and depression, especially since she has finished trastuzumab and is particularly stressed about this small clot. She is having some mild bruising to her antecubital area. A detailed review of systems is otherwise noncontributory.   PAST MEDICAL HISTORY: Past Medical History  Diagnosis Date  . 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 GERD (gastroesophageal reflux disease)   . GERD (gastroesophageal reflux disease) 08/22/2012  . Allergy   . Complication of anesthesia     1986 ; problem waking up  . Anxiety   . Wears glasses     PAST SURGICAL HISTORY: Past Surgical History  Procedure Laterality Date  . Dilation and curettage of uterus  1993  . Cervical ablation  1983  . Breast biopsy Right 08/06/12  . Popliteal synovial cyst excision  1970  . Portacath placement Left 09/12/2012    Procedure: INSERTION PORT-A-CATH;  Surgeon: Rolm Bookbinder, MD;  Location: [facility];  Service: General;  Laterality: Left;  . Breast lumpectomy with needle localization and axillary sentinel lymph node bx Right 03/10/2013    Procedure: BREAST LUMPECTOMY WITH NEEDLE LOCALIZATION AND AXILLARY SENTINEL LYMPH NODE BIOPSY;  Surgeon: Rolm Bookbinder, MD;  Location: [place] [facility] [facility];  Service: General;  Laterality: Right;  . Port-a-cath removal Left 03/10/2013    Procedure: REMOVAL PORT-A-CATH;  Surgeon: Rolm Bookbinder, MD;  Location: [place];  Service: General;  Laterality: Left;    FAMILY HISTORY Family History  Problem Relation Age of Onset  .  Hypertension Mother   . Bladder Cancer Maternal Uncle 68  . Cancer Paternal Grandmother 72    lung cancer  . Lung cancer Paternal Grandfather     dx in his 76s  . COPD Paternal Uncle    the patient's parents are living and in good health. The patient has one brother, no sisters. There is no history of breast or ovarian cancer in the family to her knowledge  GYNECOLOGIC HISTORY:  Patient's last menstrual period was 03/12/2012. Menarche age 46, first live birth age 27. The patient is GX P3. 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 Her husband, Elizabeth Miles, is a Tour manager. As daughter Elizabeth Miles, 13, lives in [place] and daughter Elizabeth Miles just had a baby girl, the patient's first grandchild. Son Elizabeth Miles "Carlynn Spry" is 34 and at home. The patient is a Psychologist, forensic.    ADVANCED DIRECTIVES:    HEALTH MAINTENANCE: History  Substance Use Topics  . Smoking status: Never Smoker   . Smokeless tobacco: Never Used  . Alcohol Use: No     Colonoscopy:  PAP:  Bone density:  Lipid panel:  Allergies  Allergen Reactions  . Darvocet [Propoxyphene N-Acetaminophen] Shortness Of Breath and Swelling  . Shellfish Allergy Shortness Of Breath and Swelling  . Other     CT dye, "chest  hurt" "makes me feel cold"    Current Outpatient Prescriptions  Medication Sig Dispense Refill  . aspirin 325 MG tablet Take 325 mg by mouth daily.    . cephALEXin (KEFLEX) 500 MG capsule Take 1,000 mg by mouth 2 (two) times daily.  0  . cetirizine (ZYRTEC) 10 MG tablet Take 10 mg by mouth daily.    Marland Kitchen Dexlansoprazole (DEXILANT) 30 MG capsule Take 1 capsule (30 mg total) by mouth daily. 90 capsule 4  . montelukast (SINGULAIR) 10 MG tablet Take 1 tablet by mouth at bedtime.  2  . acetaminophen (TYLENOL) 500 MG tablet Take 1,000 mg by mouth every 6 (six) hours as needed.    Marland Kitchen  anastrozole (ARIMIDEX) 1 MG tablet Take 1 tablet (1 mg total) by mouth daily. 30 tablet 3  . gabapentin (NEURONTIN) 100 MG capsule Take as needed for facial tingling, once daily (Patient not taking: Reported on 05/27/2014) 30 capsule 3  . ibuprofen (ADVIL,MOTRIN) 800 MG tablet Take 800 mg by mouth every 8 (eight) hours as needed for moderate pain (for back pain).    . tamoxifen (NOLVADEX) 20 MG tablet Take 1 tablet (20 mg total) by mouth daily. (Patient not taking: Reported on 05/27/2014) 90 tablet 3   No current facility-administered medications for this visit.    OBJECTIVE: Middle-aged white woman in no acute distress Filed Vitals:   05/27/14 1428  BP: 161/95  Pulse:   Temp:   Resp:      Body mass index is 34.2 kg/(m^2).    ECOG FS:1 - Symptomatic but completely ambulatory  Skin: warm, dry, minimal bruising to left inner arm, no redness or swelling HEENT: sclerae anicteric, conjunctivae pink, oropharynx clear. No thrush or mucositis.  Lymph Nodes: No cervical or supraclavicular lymphadenopathy  Lungs: clear to auscultation bilaterally, no rales, wheezes, or rhonci  Heart: regular rate and rhythm  Abdomen: round, soft, non tender, positive bowel sounds  Musculoskeletal: No focal spinal tenderness, no peripheral edema  Neuro: non focal, well oriented, depressed affect  Breasts: deferred  LAB RESULTS:  CMP     Component Value Date/Time   NA 141 05/15/2014 0910   NA 144 08/17/2013 0346   K 4.2 05/15/2014 0910   K 3.8 08/17/2013 0346   CL 105 08/17/2013 0346   CO2 23 05/15/2014 0910   GLUCOSE 94 05/15/2014 0910   GLUCOSE 155* 08/17/2013 0346   BUN 11.7 05/15/2014 0910   BUN 8 08/17/2013 0346   CREATININE 0.7 05/15/2014 0910   CREATININE 0.80 08/17/2013 0346   CALCIUM 9.5 05/15/2014 0910   PROT 7.1 05/15/2014 0910   ALBUMIN 3.8 05/15/2014 0910   AST 20 05/15/2014 0910   ALT 19 05/15/2014 0910   ALKPHOS 68 05/15/2014 0910   BILITOT 0.44 05/15/2014 0910    I No results  found for: SPEP  Lab Results  Component Value Date   WBC 3.8* 05/15/2014   NEUTROABS 2.2 05/15/2014   HGB 13.0 05/15/2014   HCT 39.1 05/15/2014   MCV 90.9 05/15/2014   PLT 245 05/15/2014      Chemistry      Component Value Date/Time   NA 141 05/15/2014 0910   NA 144 08/17/2013 0346   K 4.2 05/15/2014 0910   K 3.8 08/17/2013 0346   CL 105 08/17/2013 0346   CO2 23 05/15/2014 0910   BUN 11.7 05/15/2014 0910   BUN 8 08/17/2013 0346   CREATININE 0.7 05/15/2014 0910   CREATININE 0.80 08/17/2013 0346  Component Value Date/Time   CALCIUM 9.5 05/15/2014 0910   ALKPHOS 68 05/15/2014 0910   AST 20 05/15/2014 0910   ALT 19 05/15/2014 0910   BILITOT 0.44 05/15/2014 0910       No results found for: LABCA2  No components found for: LABCA125  No results for input(s): INR in the last 168 hours.  Urinalysis    Component Value Date/Time   LABSPEC 1.010 12/13/2012 1435   GLUCOSEU Negative 12/13/2012 1435   UROBILINOGEN 0.2 12/13/2012 1435   NITRITE Negative 12/13/2012 1435    STUDIES: Most recent echocardiogram on 03/30/14 showed an ejection fraction of 55-60% Most recent mammogram late last year, need to obtain results from [facility]: 49 y.o. [place] woman  (1) status post right breast biopsy 08/06/2012 for a clinical mT2 N0, stage IIA invasive ductal carcinoma, estrogen receptor 3+ positive, progesterone receptor and HER-2 negative, Ki67 3+ [[id number]]  (2) treated neoadjuvant Elizabeth Miles dose dense cyclophosphamide and doxorubicin x4, completed 10/25/2012, followed by a single dose of weekly paclitaxel, poorly tolerated; followed by 11 doses of Abraxane completed 02/14/2013  (3) status post right lumpectomy and sentinel lymph node sampling 03/10/2013 for a pT2 pN0, stage IIA invasive ductal carcinoma, grade 2, estrogen receptor 100% positive, progesterone receptor 21% positive, with an MIB-1 of 14%, and HER-2 amplified, the signals ratio being 2.52, the  number per cell 2.90  (4) adjuvant radiation completed March 2015 (?)  (5) started tamoxifen March 2015, stopped 05/25/14 because of superficial clot to left cephalic vein, to begin anastrozole on 05/29/14  (6) started trastuzumab 05/23/2013, completed 05/15/14   PLAN: Kenneshia was very emotional during our visit. She feels overwhelmed with her diagnosis all over again, despite being explained that this is not in fact a DVT, which are typically more dangerous. She will not require any major anticoagulant therapy such as lovenox or coumadin. She will simply continue on the aspirin $RemoveBef'325mg'VVOrNkbwmT$  daily.  Dr. Jana Hakim was consulted and he shared the visit. In an effort to avoid tamoxifen which can cause an increased risk in blood clot, we plan to start her on anastrozole. I explained to her potential side effects, such as hot flashes, vaginal changes, and joint aches. She feels comfortable beginning this drug at this time. I have also placed orders for a baseline bone density test.  Her last period was over 2 years ago in January 2014, so it is reasonable to expect that she is in menopause at this point, but I am sending her back to the labs after this visit to draw [facility] and estradiol levels to confirm.   In the meantime she was explained that it is ok to apply cold compresses and use NSAIDs for any of the mild pain she is experiencing. She admits that she is more anxious than in pain at this time. Fatim will return in 1 month for labs and a follow up visit with Dr. Jana Hakim. She understands and agrees with this plan. She knows the goal of treatment in her case is cure. She has been encouraged to call with any issues that might arise before her next visit here.   Laurie Panda, NP   05/27/2014 5:30 PM

## 2014-05-27 NOTE — Telephone Encounter (Signed)
Patient called on the way home to [place], [place] to say that she removed the bandage from her venipuncture site and has a bruise and lump about the size of a quarter.  She is very concerned because she has a "clot" in her thumb on the other arm from an infusion site at last treatment.  Suggested she drive back by so we can look at the site and provide her with an ice pack, if appropriate, since she is still in [place].  Patient returned, hand examine.  A small amount of bleeding under the skin at the venipucture site in the dorsal hand.  Gave her the ice pack and reassured her.

## 2014-05-28 ENCOUNTER — Telehealth: Payer: Self-pay | Admitting: Nurse Practitioner

## 2014-05-28 ENCOUNTER — Ambulatory Visit: Payer: BLUE CROSS/BLUE SHIELD | Admitting: Nurse Practitioner

## 2014-05-28 NOTE — Telephone Encounter (Signed)
appointments made and dexa at [facility] scheduled

## 2014-05-30 ENCOUNTER — Encounter: Payer: Self-pay | Admitting: Nurse Practitioner

## 2014-05-30 DIAGNOSIS — F419 Anxiety disorder, unspecified: Secondary | ICD-10-CM | POA: Insufficient documentation

## 2014-05-30 NOTE — Assessment & Plan Note (Signed)
Pt completed her final herceptin March 2015.  Initiated tamoxifen in March 2015; and was instructed today to hold any further tamoxifen for the time being due to new dx of superficial thrombus.   Pt scheduled to return this coming Thursday 05/28/14 for follow up.

## 2014-05-30 NOTE — Progress Notes (Signed)
SYMPTOM MANAGEMENT CLINIC   HPI: Elizabeth Miles 49 y.o. female diagnosed with breast cancer.  She is s/p neoadjuvant chemo, lumpectomy, and herceptin.  She is currently undergoing tamoxifen therapy.   Pt went to ED last nite with c/o left arm mild edema.  Doppler US obtained earlier today revealed a superficial thrombus only to left arm.  HPI  ROS  Past Medical History  Diagnosis Date  . 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 GERD (gastroesophageal reflux disease)   . GERD (gastroesophageal reflux disease) 08/22/2012  . Allergy   . Complication of anesthesia     1986 ; problem waking up  . Anxiety   . Wears glasses     Past Surgical History  Procedure Laterality Date  . Dilation and curettage of uterus  1993  . Cervical ablation  1983  . Breast biopsy Right 08/06/12  . Popliteal synovial cyst excision  1970  . Portacath placement Left 09/12/2012    Procedure: INSERTION PORT-A-CATH;  Surgeon: Rolm Bookbinder, MD;  Location: [place];  Service: General;  Laterality: Left;  . Breast lumpectomy with needle localization and axillary sentinel lymph node bx Right 03/10/2013    Procedure: BREAST LUMPECTOMY WITH NEEDLE LOCALIZATION AND AXILLARY SENTINEL LYMPH NODE BIOPSY;  Surgeon: Rolm Bookbinder, MD;  Location: [place];  Service: General;  Laterality: Right;  . Port-a-cath removal Left 03/10/2013    Procedure: REMOVAL PORT-A-CATH;  Surgeon: Rolm Bookbinder, MD;  Location: [place];  Service: General;  Laterality: Left;    has GERD (gastroesophageal reflux disease); Obesity (BMI 30-39.9); Severe obesity (BMI >= 40); Breast cancer of upper-outer quadrant of right female breast; Left facial numbness; Joint stiffness; Superficial vein thrombosis; and Anxiety on her problem list.    is allergic to darvocet; shellfish allergy; and other.    Medication List       This list is accurate as of: 05/25/14 11:59 PM.  Always use your most  recent med list.               acetaminophen 500 MG tablet  Commonly known as:  TYLENOL  Take 1,000 mg by mouth every 6 (six) hours as needed.     cephALEXin 500 MG capsule  Commonly known as:  KEFLEX  Take 1,000 mg by mouth 2 (two) times daily.     cetirizine 10 MG tablet  Commonly known as:  ZYRTEC  Take 10 mg by mouth daily.     Dexlansoprazole 30 MG capsule  Commonly known as:  DEXILANT  Take 1 capsule (30 mg total) by mouth daily.     gabapentin 100 MG capsule  Commonly known as:  NEURONTIN  Take as needed for facial tingling, once daily     ibuprofen 800 MG tablet  Commonly known as:  ADVIL,MOTRIN  Take 800 mg by mouth every 8 (eight) hours as needed for moderate pain (for back pain).     montelukast 10 MG tablet  Commonly known as:  SINGULAIR  Take 1 tablet by mouth at bedtime.     tamoxifen 20 MG tablet  Commonly known as:  NOLVADEX  Take 1 tablet (20 mg total) by mouth daily.         PHYSICAL EXAMINATION  Oncology Vitals 05/27/2014 05/25/2014 05/24/2014 05/24/2014 05/15/2014 04/24/2014 04/03/2014  Height 170 cm - - - - 170 cm -  Weight 99.066 kg 99.111 kg - - - 97.659 kg -  Weight (lbs) 218 lbs 6  oz 218 lbs 8 oz - - - 215 lbs 5 oz -  BMI (kg/m2) 34.21 kg/m2 - - - - 33.72 kg/m2 -  Temp 97.6 98.2 - 98.1 98 98.5 98.4  Pulse 78 79 97 94 92 94 87  Resp 18 - 14 - _0 SpO2 - - 99 100 100 - -  BSA (m2) 2.16 m2 - - - - 2.15 m2 -   BP Readings from Last 3 Encounters:  05/27/14 161/95  05/25/14 144/82  05/24/14 146/87    Physical Exam  Constitutional: She is oriented to person, place, and time and well-developed, well-nourished, and in no distress.  HENT:  Head: Normocephalic and atraumatic.  Eyes: Conjunctivae and EOM are normal. Pupils are equal, round, and reactive to light. Right eye exhibits no discharge. Left eye exhibits no discharge. No scleral icterus.  Neck: Normal range of motion.  Pulmonary/Chest: Effort normal. No respiratory distress.    Musculoskeletal: Normal range of motion. She exhibits edema. She exhibits no tenderness.  Trace left arm edema noted; but no erythema, no warmth, or red streaks. Pt with full ROM .   Neurological: She is alert and oriented to person, place, and time. Gait normal.  Skin: Skin is warm and dry. No rash noted. No erythema. No pallor.  Psychiatric:  Anxious.   Nursing note and vitals reviewed.   LABORATORY DATA:. No visits with results within 3 Day(s) from this visit. Latest known visit with results is:  Appointment on 05/15/2014  Component Date Value Ref Range Status  . WBC 05/15/2014 3.8* 3.9 - 10.3 10e3/uL Final  . NEUT# 05/15/2014 2.2  1.5 - 6.5 10e3/uL Final  . HGB 05/15/2014 13.0  11.6 - 15.9 g/dL Final  . HCT 05/15/2014 39.1  34.8 - 46.6 % Final  . Platelets 05/15/2014 245  145 - 400 10e3/uL Final  . MCV 05/15/2014 90.9  79.5 - 101.0 fL Final  . MCH 05/15/2014 30.2  25.1 - 34.0 pg Final  . MCHC 05/15/2014 33.2  31.5 - 36.0 g/dL Final  . RBC 05/15/2014 4.30  3.70 - 5.45 10e6/uL Final  . RDW 05/15/2014 13.0  11.2 - 14.5 % Final  . lymph# 05/15/2014 1.0  0.9 - 3.3 10e3/uL Final  . MONO# 05/15/2014 0.3  0.1 - 0.9 10e3/uL Final  . Eosinophils Absolute 05/15/2014 0.2  0.0 - 0.5 10e3/uL Final  . Basophils Absolute 05/15/2014 0.0  0.0 - 0.1 10e3/uL Final  . NEUT% 05/15/2014 58.7  38.4 - 76.8 % Final  . LYMPH% 05/15/2014 27.5  14.0 - 49.7 % Final  . MONO% 05/15/2014 8.7  0.0 - 14.0 % Final  . EOS% 05/15/2014 4.0  0.0 - 7.0 % Final  . BASO% 05/15/2014 1.1  0.0 - 2.0 % Final  . nRBC 05/15/2014 0  0 - 0 % Final  . Sodium 05/15/2014 141  136 - 145 mEq/L Final  . Potassium 05/15/2014 4.2  3.5 - 5.1 mEq/L Final  . Chloride 05/15/2014 108  98 - 109 mEq/L Final  . CO2 05/15/2014 23  22 - 29 mEq/L Final  . Glucose 05/15/2014 94  70 - 140 mg/dl Final  . BUN 05/15/2014 11.7  7.0 - 26.0 mg/dL Final  . Creatinine 05/15/2014 0.7  0.6 - 1.1 mg/dL Final  . Total Bilirubin 05/15/2014 0.44  0.20 -  1.20 mg/dL Final  . Alkaline Phosphatase 05/15/2014 68  40 - 150 U/L Final  . AST 05/15/2014 20  5 - 34 U/L Final  .  ALT 05/15/2014 19  0 - 55 U/L Final  . Total Protein 05/15/2014 7.1  6.4 - 8.3 g/dL Final  . Albumin 05/15/2014 3.8  3.5 - 5.0 g/dL Final  . Calcium 05/15/2014 9.5  8.4 - 10.4 mg/dL Final  . Anion Gap 05/15/2014 11  3 - 11 mEq/L Final  . EGFR 05/15/2014 >90  >90 ml/min/1.73 m2 Final   eGFR is calculated using the CKD-EPI Creatinine Equation (2009)   Doppler US: Summary: No evidence of deep vein thrombosis involving the left upper extremity and right subclavian vein. There is superficial thrombosis noted in the left cephalic vein from wrist to forearm.  RADIOGRAPHIC STUDIES: No results found.  ASSESSMENT/PLAN:    Superficial vein thrombosis Pt went to ED last nite with c/o left arm mild edema.  Doppler US obtained earlier today revealed a superficial thrombus only to left arm. On exam- left arm with trace edema only.  No erythema, warmth, or red streaks. Advised pt to hold tamoxifen; and to start taking aspirin 325 mg daily instead. Advised pt may elevate arm above level of heart; and apply warm compresses to site.    Also advised that she keep her appt with Veleta Miners, NP planned for later this week.    Breast cancer of upper-outer quadrant of right female breast Pt completed her final herceptin March 2015.  Initiated tamoxifen in March 2015; and was instructed today to hold any further tamoxifen for the time being due to new dx of superficial thrombus.   Pt scheduled to return this coming Thursday 05/28/14 for follow up.    Anxiety Pt admitted to both anxiety and mild depression regarding her breast cancer dx.  She denies any suicidal or homicidal ideation. She does not want not want to consider either an anxiety or antidepressant medication at this time.    Patient stated understanding of all instructions; and was in agreement with this plan of care. The  patient knows to call the clinic with any problems, questions or concerns.   Review/collaboration with Dr. Jana Hakim regarding all aspects of patient's visit today.   Total time spent with patient was 40 minutes;  with greater than 75 percent of that time spent in face to face counseling regarding patient's symptoms,  and coordination of care and follow up.  Disclaimer: This note was dictated with voice recognition software. Similar sounding words can inadvertently be transcribed and may not be corrected upon review.   Drue Second, NP 05/30/2014

## 2014-05-30 NOTE — Assessment & Plan Note (Signed)
Pt admitted to both anxiety and mild depression regarding her breast cancer dx.  She denies any suicidal or homicidal ideation. She does not want not want to consider either an anxiety or antidepressant medication at this time.

## 2014-05-30 NOTE — Assessment & Plan Note (Signed)
Pt went to ED last nite with c/o left arm mild edema.  Doppler US obtained earlier today revealed a superficial thrombus only to left arm. On exam- left arm with trace edema only.  No erythema, warmth, or red streaks. Advised pt to hold tamoxifen; and to start taking aspirin 325 mg daily instead. Advised pt may elevate arm above level of heart; and apply warm compresses to site.    Also advised that she keep her appt with Veleta Miners, NP planned for later this week.

## 2014-05-31 LAB — ESTRADIOL, ULTRA SENS: Estradiol, Ultra Sensitive: 9 pg/mL

## 2014-06-08 ENCOUNTER — Ambulatory Visit: Payer: BLUE CROSS/BLUE SHIELD | Admitting: Oncology

## 2014-06-08 ENCOUNTER — Other Ambulatory Visit: Payer: BLUE CROSS/BLUE SHIELD

## 2014-06-11 ENCOUNTER — Encounter (HOSPITAL_COMMUNITY): Payer: Self-pay

## 2014-06-11 ENCOUNTER — Emergency Department (HOSPITAL_COMMUNITY)
Admission: EM | Admit: 2014-06-11 | Discharge: 2014-06-11 | Disposition: A | Discharge status: Home / Self Care | Payer: BLUE CROSS/BLUE SHIELD | Attending: Emergency Medicine | Admitting: Emergency Medicine

## 2014-06-11 DIAGNOSIS — T38895A Adverse effect of other hormones and synthetic substitutes, initial encounter: Secondary | ICD-10-CM | POA: Diagnosis not present

## 2014-06-11 DIAGNOSIS — R21 Rash and other nonspecific skin eruption: Secondary | ICD-10-CM | POA: Insufficient documentation

## 2014-06-11 DIAGNOSIS — F419 Anxiety disorder, unspecified: Secondary | ICD-10-CM | POA: Diagnosis not present

## 2014-06-11 DIAGNOSIS — K219 Gastro-esophageal reflux disease without esophagitis: Secondary | ICD-10-CM | POA: Diagnosis not present

## 2014-06-11 DIAGNOSIS — Z793 Long term (current) use of hormonal contraceptives: Secondary | ICD-10-CM | POA: Insufficient documentation

## 2014-06-11 DIAGNOSIS — Z7982 Long term (current) use of aspirin: Secondary | ICD-10-CM | POA: Diagnosis not present

## 2014-06-11 DIAGNOSIS — Z853 Personal history of malignant neoplasm of breast: Secondary | ICD-10-CM | POA: Diagnosis not present

## 2014-06-11 DIAGNOSIS — Z79899 Other long term (current) drug therapy: Secondary | ICD-10-CM | POA: Insufficient documentation

## 2014-06-11 DIAGNOSIS — T50905A Adverse effect of unspecified drugs, medicaments and biological substances, initial encounter: Secondary | ICD-10-CM

## 2014-06-11 NOTE — Discharge Instructions (Signed)
Per the recommendation of Dr. Lindi Adie, discontinue the Arimidex. Continue taking Aspirin. Call your oncologist in the morning to schedule a follow-up appointment and discuss further treatments. Return to the emergency department as needed if symptoms worsen or persist despite discontinuing this medicine.  Drug Allergy Allergic reactions to medicines are common. Some allergic reactions are mild. A delayed type of drug allergy that occurs 1 week or more after exposure to a medicine or vaccine is called serum sickness. A life-threatening, sudden (acute) allergic reaction that involves the whole body is called anaphylaxis. CAUSES  "True" drug allergies occur when there is an allergic reaction to a medicine. This is caused by overactivity of the immune system. First, the body becomes sensitized. The immune system is triggered by your first exposure to the medicine. Following this first exposure, future exposure to the same medicine may be life-threatening. Almost any medicine can cause an allergic reaction. Common ones are:  Penicillin.  Sulfonamides (sulfa drugs).  Local anesthetics.  X-ray dyes that contain iodine. SYMPTOMS  Common symptoms of a minor allergic reaction are:  Swelling around the mouth.  An itchy red rash or hives.  Vomiting or diarrhea. Anaphylaxis can cause swelling of the mouth and throat. This makes it difficult to breathe and swallow. Severe reactions can be fatal within seconds, even after exposure to only a trace amount of the drug that causes the reaction. HOME CARE INSTRUCTIONS   If you are unsure of what caused your reaction, keep a diary of foods and medicines used. Include the symptoms that followed. Avoid anything that causes reactions.  You may want to follow up with an allergy specialist after the reaction has cleared in order to be tested to confirm the allergy. It is important to confirm that your reaction is an allergy, not just a side effect to the medicine.  If you have a true allergy to a medicine, this may prevent that medicine and related medicines from being given to you when you are very ill.  If you have hives or a rash:  Take medicines as directed by your caregiver.  You may use an over-the-counter antihistamine (diphenhydramine) as needed.  Apply cold compresses to the skin or take baths in cool water. Avoid hot baths or showers.  If you are severely allergic:  Continuous observation after a severe reaction may be needed. Hospitalization is often required.  Wear a medical alert bracelet or necklace stating your allergy.  You and your family must learn how to use an anaphylaxis kit or give an epinephrine injection to temporarily treat an emergency allergic reaction. If you have had a severe reaction, always carry your epinephrine injection or anaphylaxis kit with you. This can be lifesaving if you have a severe reaction.  Do not drive or perform tasks after treatment until the medicines used to treat your reaction have worn off, or until your caregiver says it is okay. SEEK MEDICAL CARE IF:   You think you had an allergic reaction. Symptoms usually start within 30 minutes after exposure.  Symptoms are getting worse rather than better.  You develop new symptoms.  The symptoms that brought you to your caregiver return. SEEK IMMEDIATE MEDICAL CARE IF:   You have swelling of the mouth, difficulty breathing, or wheezing.  You have a tight feeling in your chest or throat.  You develop hives, swelling, or itching all over your body.  You develop severe vomiting or diarrhea.  You feel faint or pass out. This is an emergency. Use  your epinephrine injection or anaphylaxis kit as you have been instructed. Call for emergency medical help. Even if you improve after the injection, you need to be examined at a hospital emergency department. MAKE SURE YOU:   Understand these instructions.  Will watch your condition.  Will get help  right away if you are not doing well or get worse. Document Released: 02/13/2005 Document Revised: 05/08/2011 Document Reviewed: 07/20/2010 [facility] Patient Information 2015 [place], [place]. This information is not intended to replace advice given to you by your health care provider. Make sure you discuss any questions you have with your health care provider.

## 2014-06-11 NOTE — ED Notes (Signed)
Pt started on a new medication April 1st, she was told she may have hot flashes and red areas, she has around her neck and face, today she noticed a rash from her thighs down.

## 2014-06-11 NOTE — ED Provider Notes (Signed)
CSN: [id number]     Arrival date & time 06/11/14  1943 History  This chart was scribed for non-physician practitioner, Antonietta Breach, PA-C working with Carmin Muskrat, MD, by Erling Conte, ED Scribe. This patient was seen in room WTR5/WTR5 and the patient's care was started at 9:15 PM.    Chief Complaint  Patient presents with  . Medication Reaction    Patient is a 49 y.o. female presenting with rash. The history is provided by the patient. No language interpreter was used.  Rash Location:  Face, head/neck, torso and leg Head/neck rash location:  L neck and R neck Facial rash location:  Face Torso rash location:  L chest and R chest Leg rash location:  L upper leg and R upper leg Quality: burning and redness   Severity:  Mild Onset quality:  Gradual Duration:  1 day Timing:  Intermittent Progression:  Resolved Chronicity:  New Context: medications   Relieved by:  None tried Worsened by:  Continued exposure to allergens Ineffective treatments:  None tried Associated symptoms: no shortness of breath, no throat swelling, no tongue swelling and not wheezing     HPI Comments: Elizabeth Miles is a 49 y.o. female with a h/o breast cancer, GERD, seasonal allergies and anxiety, who presents to the Emergency Department complaining of a medication reaction to anastrazole. Pt has a h/o of breast cancer for which she is taking the medication. Pt states she developed a burning, red and white blotchy, rash to her bilateral thighs. Pt notes the rash felt warm to the touch. She also states she developed intermittent redness to her cheeks and spread from her neck down to the top of her chest for 1 day. She states the redness and rash lasts 1-2 hours and then resolves. Pt called her physician, Dr Jana Hakim, and they told her to quit taking the medication and to come to the ED. Pt has not taken anything for her symptoms. Pt previously took tamoxifen but went off of it March 28th due to h/o superficial  thrombophlebitis. She denies any SOB, lip swelling, tongue swelling, or throat swelling.   Past Medical History  Diagnosis Date  . 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 GERD (gastroesophageal reflux disease)   . GERD (gastroesophageal reflux disease) 08/22/2012  . Allergy   . Complication of anesthesia     1986 ; problem waking up  . Anxiety   . Wears glasses    Past Surgical History  Procedure Laterality Date  . Dilation and curettage of uterus  1993  . Cervical ablation  1983  . Breast biopsy Right 08/06/12  . Popliteal synovial cyst excision  1970  . Portacath placement Left 09/12/2012    Procedure: INSERTION PORT-A-CATH;  Surgeon: Rolm Bookbinder, MD;  Location: [facility];  Service: General;  Laterality: Left;  . Breast lumpectomy with needle localization and axillary sentinel lymph node bx Right 03/10/2013    Procedure: BREAST LUMPECTOMY WITH NEEDLE LOCALIZATION AND AXILLARY SENTINEL LYMPH NODE BIOPSY;  Surgeon: Rolm Bookbinder, MD;  Location: [facility];  Service: General;  Laterality: Right;  . Port-a-cath removal Left 03/10/2013    Procedure: REMOVAL PORT-A-CATH;  Surgeon: Rolm Bookbinder, MD;  Location: [facility];  Service: General;  Laterality: Left;   Family History  Problem Relation Age of Onset  . Hypertension Mother   . Bladder Cancer Maternal Uncle 68  . Cancer Paternal Grandmother 30    lung cancer  . Lung cancer  Paternal Grandfather     dx in his 43s  . COPD Paternal Uncle    History  Substance Use Topics  . Smoking status: Never Smoker   . Smokeless tobacco: Never Used  . Alcohol Use: No   OB History    No data available      Review of Systems  Respiratory: Negative for shortness of breath and wheezing.   Skin: Positive for color change (redness to face, neck and chest; now resolved) and rash (now resolved).  All other systems reviewed and are negative.   Allergies  Darvocet; Shellfish allergy; and  Other  Home Medications   Prior to Admission medications   Medication Sig Start Date End Date Taking? Authorizing Provider  acetaminophen (TYLENOL) 500 MG tablet Take 1,000 mg by mouth every 6 (six) hours as needed.    Historical Provider, MD  anastrozole (ARIMIDEX) 1 MG tablet Take 1 tablet (1 mg total) by mouth daily. 05/27/14   Laurie Panda, NP  aspirin 325 MG tablet Take 325 mg by mouth daily.    Historical Provider, MD  cephALEXin (KEFLEX) 500 MG capsule Take 1,000 mg by mouth 2 (two) times daily. 05/16/14   Historical Provider, MD  cetirizine (ZYRTEC) 10 MG tablet Take 10 mg by mouth daily.    Historical Provider, MD  Dexlansoprazole (DEXILANT) 30 MG capsule Take 1 capsule (30 mg total) by mouth daily. 12/16/12   Minette Headland, NP  gabapentin (NEURONTIN) 100 MG capsule Take as needed for facial tingling, once daily Patient not taking: Reported on 05/27/2014 08/26/13   Star Age, MD  ibuprofen (ADVIL,MOTRIN) 800 MG tablet Take 800 mg by mouth every 8 (eight) hours as needed for moderate pain (for back pain).    Historical Provider, MD  montelukast (SINGULAIR) 10 MG tablet Take 1 tablet by mouth at bedtime. 05/16/14   Historical Provider, MD  tamoxifen (NOLVADEX) 20 MG tablet Take 1 tablet (20 mg total) by mouth daily. Patient not taking: Reported on 05/27/2014 05/23/13   Amada Kingfisher, MD   Triage Vitals: BP 127/75 mmHg  Pulse 101  Temp(Src) 98.2 F (36.8 C) (Oral)  Resp 22  SpO2 99%  LMP 03/12/2012  Physical Exam  Constitutional: She is oriented to person, place, and time. She appears well-developed and well-nourished. No distress.  HENT:  Head: Normocephalic and atraumatic.  Mouth/Throat: Oropharynx is clear and moist. No oropharyngeal exudate.  Oropharynx clear. No angioedema. No oral lesions. Patient tolerating secretions without difficulty  Eyes: Conjunctivae and EOM are normal. No scleral icterus.  Neck: Normal range of motion.  No stridor  Cardiovascular: Normal  rate, regular rhythm and intact distal pulses.   Patient not tachycardic as noted in triage  Pulmonary/Chest: Effort normal and breath sounds normal. No respiratory distress. She has no wheezes. She has no rales.  Lungs clear bilaterally. No tachypnea or dyspnea. No hypoxia.  Musculoskeletal: Normal range of motion.  Neurological: She is alert and oriented to person, place, and time. She exhibits normal muscle tone. Coordination normal.  Skin: Skin is warm and dry. No rash noted. She is not diaphoretic. No erythema. No pallor.  No rash appreciated to face, trunk, or extremities. Patient states that the rash resolved prior to arrival.  Psychiatric: She has a normal mood and affect. Her behavior is normal.  Nursing note and vitals reviewed.   ED Course  Procedures (including critical care time)  DIAGNOSTIC STUDIES: Oxygen Saturation is 99% on RA, normal by my interpretation.    COORDINATION  OF CARE:  Labs Review Labs Reviewed - No data to display  Imaging Review No results found.   EKG Interpretation None      MDM   Final diagnoses:  Medication reaction, initial encounter    49 year old female presents to the emergency department for a rash which she believes is attributed to recently starting Arimidex. Patient is asymptomatic on arrival. She has a normal physical exam. No fever, tachypnea, dyspnea, or hypoxia. No angioedema. I consulted with Dr. Lindi Adie on call for oncology who believes her symptoms may be secondary to Arimidex. He recommends discontinuing this medicine and following up in the office with her oncologist. Plan discussed with patient who verbalizes understanding. Patient stable for discharge at this time with no unaddressed concerns. Patient discharged in good condition.  I personally performed the services described in this documentation, which was scribed in my presence. The recorded information has been reviewed and is accurate.   Filed Vitals:   06/11/14  1954  BP: 127/75  Pulse: 101  Temp: 98.2 F (36.8 C)  TempSrc: Oral  Resp: 22  SpO2: 99%       Antonietta Breach, PA-C 06/11/14 2210  Carmin Muskrat, MD 06/12/14 [phone]

## 2014-06-11 NOTE — ED Notes (Signed)
Pt reports taking Remidex 1st of April.  Started to have facial flush and tonight started to have rash on her legs.  Called her oncologist office and spoke to a nurse.  Was instructed to not take anything and to come to the ED.

## 2014-06-12 ENCOUNTER — Telehealth: Payer: Self-pay | Admitting: *Deleted

## 2014-06-12 ENCOUNTER — Other Ambulatory Visit: Payer: Self-pay | Admitting: Nurse Practitioner

## 2014-06-12 NOTE — Telephone Encounter (Signed)
Returned pt's call concerning visit to ED . Pt presented with complaints of a rash with whelping, burning and redness to both thighs. Had redness also to her face and neck area. Rash was gone by the time she got to ED. Pt seems to think this may attribute to the Anastrazole which she has been on since April 1st. No answer but left a detailed message to stop anastrazole and when she comes on 5/9 we will discuss options. Pt is to see Dr. Jana Hakim on 07/06/14. Pt knows to call at [phone] if there are any concerns.

## 2014-06-16 ENCOUNTER — Other Ambulatory Visit: Payer: Self-pay | Admitting: Oncology

## 2014-07-06 ENCOUNTER — Ambulatory Visit (HOSPITAL_BASED_OUTPATIENT_CLINIC_OR_DEPARTMENT_OTHER): Payer: BLUE CROSS/BLUE SHIELD | Admitting: Oncology

## 2014-07-06 ENCOUNTER — Other Ambulatory Visit (HOSPITAL_BASED_OUTPATIENT_CLINIC_OR_DEPARTMENT_OTHER): Payer: BLUE CROSS/BLUE SHIELD

## 2014-07-06 ENCOUNTER — Telehealth: Payer: Self-pay | Admitting: General Practice

## 2014-07-06 ENCOUNTER — Telehealth: Payer: Self-pay | Admitting: Oncology

## 2014-07-06 VITALS — BP 135/77 | HR 80 | Temp 98.3°F | Resp 18 | Ht 67.0 in | Wt 216.4 lb

## 2014-07-06 DIAGNOSIS — R238 Other skin changes: Secondary | ICD-10-CM | POA: Diagnosis not present

## 2014-07-06 DIAGNOSIS — C50411 Malignant neoplasm of upper-outer quadrant of right female breast: Secondary | ICD-10-CM

## 2014-07-06 DIAGNOSIS — C50919 Malignant neoplasm of unspecified site of unspecified female breast: Secondary | ICD-10-CM

## 2014-07-06 DIAGNOSIS — M8580 Other specified disorders of bone density and structure, unspecified site: Secondary | ICD-10-CM

## 2014-07-06 DIAGNOSIS — E669 Obesity, unspecified: Secondary | ICD-10-CM

## 2014-07-06 LAB — COMPREHENSIVE METABOLIC PANEL (CC13)
ALBUMIN: 3.7 g/dL (ref 3.5–5.0)
ALT: 19 U/L (ref 0–55)
ANION GAP: 10 meq/L (ref 3–11)
AST: 14 U/L (ref 5–34)
Alkaline Phosphatase: 66 U/L (ref 40–150)
BUN: 12.1 mg/dL (ref 7.0–26.0)
CALCIUM: 9.1 mg/dL (ref 8.4–10.4)
CO2: 25 meq/L (ref 22–29)
Chloride: 107 mEq/L (ref 98–109)
Creatinine: 0.7 mg/dL (ref 0.6–1.1)
GLUCOSE: 91 mg/dL (ref 70–140)
POTASSIUM: 4.3 meq/L (ref 3.5–5.1)
SODIUM: 142 meq/L (ref 136–145)
TOTAL PROTEIN: 6.7 g/dL (ref 6.4–8.3)
Total Bilirubin: 0.27 mg/dL (ref 0.20–1.20)

## 2014-07-06 LAB — CBC WITH DIFFERENTIAL/PLATELET
BASO%: 1.3 % (ref 0.0–2.0)
Basophils Absolute: 0.1 10*3/uL (ref 0.0–0.1)
EOS%: 14.9 % — AB (ref 0.0–7.0)
Eosinophils Absolute: 0.6 10*3/uL — ABNORMAL HIGH (ref 0.0–0.5)
HEMATOCRIT: 40.5 % (ref 34.8–46.6)
HEMOGLOBIN: 13.4 g/dL (ref 11.6–15.9)
LYMPH%: 30.8 % (ref 14.0–49.7)
MCH: 30.2 pg (ref 25.1–34.0)
MCHC: 33.1 g/dL (ref 31.5–36.0)
MCV: 91.4 fL (ref 79.5–101.0)
MONO#: 0.4 10*3/uL (ref 0.1–0.9)
MONO%: 10.7 % (ref 0.0–14.0)
NEUT#: 1.6 10*3/uL (ref 1.5–6.5)
NEUT%: 42.3 % (ref 38.4–76.8)
Platelets: 238 10*3/uL (ref 145–400)
RBC: 4.43 10*6/uL (ref 3.70–5.45)
RDW: 13.2 % (ref 11.2–14.5)
WBC: 3.8 10*3/uL — ABNORMAL LOW (ref 3.9–10.3)
lymph#: 1.2 10*3/uL (ref 0.9–3.3)

## 2014-07-06 MED ORDER — KETOCONAZOLE 2 % EX CREA: 1.0000 "application " | TOPICAL_CREAM | Freq: Every day | CUTANEOUS | Status: DC

## 2014-07-06 NOTE — Progress Notes (Signed)
[place]  [phone] [phone]     ID: Elizabeth Miles DOB: June 10, 1965  MR#: [id number]  CBS#:[id number]  Patient Care Team: Clinton Quant, MD as PCP - General (Internal Medicine) Clinton Quant, MD as Referring Physician (Internal Medicine) Rolm Bookbinder, MD as Consulting Physician (General Surgery) Chauncey Cruel, MD as Consulting Physician (Oncology) Thea Silversmith, MD as Consulting Physician (Radiation Oncology) San Morelle, MD as Consulting Physician (Gynecology)   CHIEF COMPLAINT: right breat cancer  CURRENT TREATMENT: Observation  BREAST CANCER HISTORY: From Dr. Dana Allan earlier notes:  "Elizabeth Miles is a 49 y.o. female. Without significant past medical history who on June 2013 had a mammogram that was normal. But there was on physical exam possibility of a cyst noted in the right breast. The mammogram was negative. In 2014 June patient noted on exam another lump in the right breast. She underwent a diagnostic mammogram on June 10 that showed a right breast nodule in the outer quadrant. She had an ultrasound performed that showed at the 9:30 o'clock position a 3.6 cm area and then added 10:00 position 1.3 cm area with a total area being anywhere between 5-6 cm. The patient went on to have a right breast biopsy performed in [place]. The pathology revealed an invasive ductal carcinoma. This is been confirmed by our pathology as well. The carcinoma and papillary features and was felt to be between a grade 1 and 2. The tumor was estrogen receptor positive strongly (100%) progesterone receptor negative HER-2/neu negative with a Ki-67 that showed a high proliferation rate. Patient is now seen in medical oncology for discussion of treatment options."  Bilateral breast MRI on 08/27/2012 revealed in the right upper quadrant irregular lobulated mass with a satellite nodule or lobulation within 2 mm at its superior aspect, measured  together as 4.5 x 4.0 x 3.8 cm. There was extension of enhancement to the nipple, suggesting nipple involvement may be present. No lymphadenopathy was noted there was no any other area of abnormal enhancement in either breast (clinical stage IIA, T2 N0).  PET scan performed on 08/29/2012 revealed the primary breast cancer measuring 3.2 x 3.3 cm with SUV of 16. It was adjacent nodule along the superiomedial border of the primary mass measuring 1.4 cm which was also hypermetabolic. There were no additional areas of abnormal hypermetabolism. No abnormal hypermetabolic activity was seen in the chest, abdomen/pelvis,within the liver, pancreas, adrenal glands or spleen. No hypermetabolic lymph nodes. In the skeleton, no focal hypermetabolic activity to suggest skeletal metastasis was seen.  Completed neoadjuvant chemotherapy consisting of Q14 day Adriamycin/Cytoxan x 4 cycles on 10/25/2012, followed by one dose of neoadjuvant Taxol on 11/08/2012. She developed significant grade 2 neuropathy and Taxol was discontinued.  Then received neoadjuvant chemotherapy consisting of single agent Abraxane given on day 1, 8, 15 of each 28 day cycle. She completed therapy on 11/15/12 - 02/14/13.   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 Repeat HER-2 testing was now positive. The patient was started on tamoxifen March 2015 and on Herceptin the same month.  Her subsequent history is as detailed below.  INTERVAL HISTORY: Elizabeth Miles returns to clinic today for follow up of her breast cancer, accompanied by her mother. Since her last visit here Terri started anastrozole. She tolerated it very poorly. From the second day she started feeling  bad. She felt like there was something sitting on her chest. She feels short of breath and then she developed whelps. This took her to the  emergency room here although by then the whelps had resolved. She stopped the medication after 2 weeks, mid April, and 2 days later she was already feeling better. Since her last visit here she also had a bone density which shows osteopenia.   REVIEW OF SYSTEMS: Elizabeth Miles tells me the left cranial nerve problem she was experiencing before has largely resolved. She is back on her treadmill and does some jogging on it as well as walking. She is also doing squats. She is trying to lose weight. Work is fine as his family. The top of her feet continue to hurt. There is a spot between her blood cheeks that Dr. Humphrey Rolls looked at before and treated with Neosporin, but which has been there now for 2 months and not responding. She wanted me to look at it today. A detailed review of systems today was otherwise negative.Marland Kitchen   PAST MEDICAL HISTORY: Past Medical History  Diagnosis Date  . 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 GERD (gastroesophageal reflux disease)   . GERD (gastroesophageal reflux disease) 08/22/2012  . Allergy   . Complication of anesthesia     1986 ; problem waking up  . Anxiety   . Wears glasses     PAST SURGICAL HISTORY: Past Surgical History  Procedure Laterality Date  . Dilation and curettage of uterus  1993  . Cervical ablation  1983  . Breast biopsy Right 08/06/12  . Popliteal synovial cyst excision  1970  . Portacath placement Left 09/12/2012    Procedure: INSERTION PORT-A-CATH;  Surgeon: Rolm Bookbinder, MD;  Location: [facility];  Service: General;  Laterality: Left;  . Breast lumpectomy with needle localization and axillary sentinel lymph node bx Right 03/10/2013    Procedure: BREAST LUMPECTOMY WITH NEEDLE LOCALIZATION AND AXILLARY SENTINEL LYMPH NODE BIOPSY;  Surgeon: Rolm Bookbinder, MD;  Location: [facility];  Service: General;  Laterality: Right;  . Port-a-cath removal Left 03/10/2013    Procedure: REMOVAL PORT-A-CATH;  Surgeon: Rolm Bookbinder, MD;   Location: [facility];  Service: General;  Laterality: Left;    FAMILY HISTORY Family History  Problem Relation Age of Onset  . Hypertension Mother   . Bladder Cancer Maternal Uncle 68  . Cancer Paternal Grandmother 3    lung cancer  . Lung cancer Paternal Grandfather     dx in his 5s  . COPD Paternal Uncle    the patient's parents are living and in good health. The patient has one brother, no sisters. There is no history of breast or ovarian cancer in the family to her knowledge  GYNECOLOGIC HISTORY:  Patient's last menstrual period was 03/12/2012. Menarche age 23, first live birth age 43. The patient is GX P3. 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 Her husband, Jori Moll, is a Tour manager. As daughter Lovena Le, 41, lives in [place] and daughter Caryl Pina just had a baby girl, the patient's first grandchild. Son Jori Moll "Carlynn Spry" is 41 and at home. The patient is a Psychologist, forensic.    ADVANCED DIRECTIVES: Not in place   HEALTH MAINTENANCE: History  Substance Use Topics  . Smoking status: Never Smoker   . Smokeless tobacco: Never Used  . Alcohol  Use: No     Colonoscopy:  PAP:  Bone density:  Lipid panel:  Allergies  Allergen Reactions  . Darvocet [Propoxyphene N-Acetaminophen] Shortness Of Breath and Swelling  . Shellfish Allergy Shortness Of Breath and Swelling  . Other     CT dye, "chest hurt" "makes me feel cold"    Current Outpatient Prescriptions  Medication Sig Dispense Refill  . acetaminophen (TYLENOL) 500 MG tablet Take 1,000 mg by mouth every 6 (six) hours as needed.    Marland Kitchen anastrozole (ARIMIDEX) 1 MG tablet Take 1 tablet (1 mg total) by mouth daily. 30 tablet 3  . aspirin 325 MG tablet Take 325 mg by mouth daily.    . cephALEXin (KEFLEX) 500 MG capsule Take 1,000 mg by mouth 2 (two) times daily.  0    . cetirizine (ZYRTEC) 10 MG tablet Take 10 mg by mouth daily.    Marland Kitchen Dexlansoprazole (DEXILANT) 30 MG capsule Take 1 capsule (30 mg total) by mouth daily. 90 capsule 4  . gabapentin (NEURONTIN) 100 MG capsule Take as needed for facial tingling, once daily (Patient not taking: Reported on 05/27/2014) 30 capsule 3  . ibuprofen (ADVIL,MOTRIN) 800 MG tablet Take 800 mg by mouth every 8 (eight) hours as needed for moderate pain (for back pain).    . montelukast (SINGULAIR) 10 MG tablet Take 1 tablet by mouth at bedtime.  2  . tamoxifen (NOLVADEX) 20 MG tablet Take 1 tablet (20 mg total) by mouth daily. (Patient not taking: Reported on 05/27/2014) 90 tablet 3   No current facility-administered medications for this visit.    OBJECTIVE: Middle-aged white woman  who appears stated age  49 Vitals:   07/06/14 1023  BP: 135/77  Pulse: 80  Temp: 98.3 F (36.8 C)  Resp: 18     Body mass index is 33.88 kg/(m^2).    ECOG FS:1 - Symptomatic but completely ambulatory  Sclerae unicteric, pupils round and equal Oropharynx clear and moist-- no thrush or other lesions No cervical or supraclavicular adenopathy Lungs no rales or rhonchi Heart regular rate and rhythm Abd soft, nontender, positive bowel sounds MSK no focal spinal tenderness, no upper extremity lymphedema Neuro: nonfocal, well oriented, appropriate affect Breasts: Deferred Skin: In the gluteal fold there is an area of approximately 3 cm which is erythematous with minimal desquamation centrally. There is no ulceration. There are no palpable lesions.  LAB RESULTS:  CMP     Component Value Date/Time   NA 142 07/06/2014 0916   NA 144 08/17/2013 0346   K 4.3 07/06/2014 0916   K 3.8 08/17/2013 0346   CL 105 08/17/2013 0346   CO2 25 07/06/2014 0916   GLUCOSE 91 07/06/2014 0916   GLUCOSE 155* 08/17/2013 0346   BUN 12.1 07/06/2014 0916   BUN 8 08/17/2013 0346   CREATININE 0.7 07/06/2014 0916   CREATININE 0.80 08/17/2013 0346   CALCIUM  9.1 07/06/2014 0916   PROT 6.7 07/06/2014 0916   ALBUMIN 3.7 07/06/2014 0916   AST 14 07/06/2014 0916   ALT 19 07/06/2014 0916   ALKPHOS 66 07/06/2014 0916   BILITOT 0.27 07/06/2014 0916    I No results found for: SPEP  Lab Results  Component Value Date   WBC 3.8* 07/06/2014   NEUTROABS 1.6 07/06/2014   HGB 13.4 07/06/2014   HCT 40.5 07/06/2014   MCV 91.4 07/06/2014   PLT 238 07/06/2014      Chemistry      Component Value Date/Time   NA 142  07/06/2014 0916   NA 144 08/17/2013 0346   K 4.3 07/06/2014 0916   K 3.8 08/17/2013 0346   CL 105 08/17/2013 0346   CO2 25 07/06/2014 0916   BUN 12.1 07/06/2014 0916   BUN 8 08/17/2013 0346   CREATININE 0.7 07/06/2014 0916   CREATININE 0.80 08/17/2013 0346      Component Value Date/Time   CALCIUM 9.1 07/06/2014 0916   ALKPHOS 66 07/06/2014 0916   AST 14 07/06/2014 0916   ALT 19 07/06/2014 0916   BILITOT 0.27 07/06/2014 0916       No results found for: LABCA2  No components found for: OIBBC488  No results for input(s): INR in the last 168 hours.  Urinalysis    Component Value Date/Time   LABSPEC 1.010 12/13/2012 1435   PHURINE 7.0 12/13/2012 1435   GLUCOSEU Negative 12/13/2012 1435   HGBUR Trace 12/13/2012 1435   BILIRUBINUR Negative 12/13/2012 1435   KETONESUR Negative 12/13/2012 1435   PROTEINUR Negative 12/13/2012 1435   UROBILINOGEN 0.2 12/13/2012 1435   NITRITE Negative 12/13/2012 1435   LEUKOCYTESUR Small 12/13/2012 1435    STUDIES: Bone density at [facility] 06/10/2014 shows osteopenia with a T score of -1.7.  ASSESSMENT: 49 y.o. [place] woman  (1) status post right breast biopsy 08/06/2012 for a clinical mT2 N0, stage IIA invasive ductal carcinoma, estrogen receptor 3+ positive, progesterone receptor and HER-2 negative, Ki67 3+ [[id number]]  (2) treated neoadjuvant [place] dose dense cyclophosphamide and doxorubicin x4, completed 10/25/2012, followed by a single dose of weekly paclitaxel,  poorly tolerated; followed by 11 doses of Abraxane completed 02/14/2013  (3) status post right lumpectomy and sentinel lymph node sampling 03/10/2013 for a pT2 pN0, stage IIA invasive ductal carcinoma, grade 2, estrogen receptor 100% positive, progesterone receptor 21% positive, with an MIB-1 of 14%, and HER-2 amplified, the signals ratio being 2.52, the number per cell 2.90  (4) adjuvant radiation completed March 2015 (?)  (5) started tamoxifen March 2015, stopped 05/25/14 because of superficial clot to left cephalic vein; began anastrozole on 05/29/14, discontinued after 2 weeks with multiple side effects  (6) started trastuzumab 05/23/2013, completed 05/15/14  (7) osteopenia: To start zolendronate July 2016   PLAN: Elizabeth Miles did her best to tolerate the anastrozole but really could not take it. Now having struck out with 2 anti-estrogens I think a better idea is to let her have a "medication" from treatment for about 2 months. She is experiencing continuing improvement in other symptoms and perhaps she will feel more "back to normal" when she returns to see me in July. At that time we can consider exemestane.  Today we discussed zolendronate, since her bone density shows osteopenia. She has a good understanding of the possible toxicities, side effects and combinations of that medication including the problem with osteonecrosis of the jaw. I have asked her to discuss this with her dentist, whom she is expected to see within the next 2 weeks.  The area of rawness between her buttock cheeks I think likely is fungal and I am adding nice oral cream. We will check this again at the next visit here.  She has a good understanding of the overall plan. She agrees with it. She knows the goal of treatment in her case is cure. She will call with any problems that may develop before her next visit here. Chauncey Cruel, MD   07/06/2014 10:48 AM

## 2014-07-06 NOTE — Telephone Encounter (Signed)
Appointments made and avs printed for patient °

## 2014-07-16 ENCOUNTER — Telehealth: Payer: Self-pay | Admitting: *Deleted

## 2014-07-16 NOTE — Telephone Encounter (Signed)
PT. DOES NOT REMEMBER HITTING HER HAND. PT. WENT TO [place]. LAB WORK AND XRAY WAS DONE. NO ISSUES FOUND. THE ED PHYSICIAN TOLD PT. TO STOP HER ASPIRIN. TODAY THE HEMATOMA HAS SPREAD ACROSS THE BACK OF PT.'S ENTIRE HAND. THERE IS A DECREASE IN THE PUFFINESS EXCEPT AT HER WRIST. PT. IS TAKING ASPIRIN 325MG  DAILY. SHE WANTED TO ASK DR.MAGRINAT IF SHE SHOULD STOP THE ASPIRIN?

## 2014-07-17 NOTE — Telephone Encounter (Signed)
SPOKE TO DR.MAGRINAT'S NURSE, VAL DODD,RN. PT. MAY STOP THE ASPIRIN 325MG  OR TAKE A LOWER DOSE OF ASPIRIN. NOTIFIED PT. SHE VOICES UNDERSTANDING.

## 2014-09-07 ENCOUNTER — Telehealth: Payer: Self-pay | Admitting: Oncology

## 2014-09-07 ENCOUNTER — Other Ambulatory Visit (HOSPITAL_BASED_OUTPATIENT_CLINIC_OR_DEPARTMENT_OTHER): Payer: BLUE CROSS/BLUE SHIELD

## 2014-09-07 ENCOUNTER — Ambulatory Visit (HOSPITAL_BASED_OUTPATIENT_CLINIC_OR_DEPARTMENT_OTHER): Payer: BLUE CROSS/BLUE SHIELD | Admitting: Oncology

## 2014-09-07 VITALS — BP 119/80 | HR 81 | Temp 97.9°F | Resp 18 | Ht 67.0 in | Wt 224.3 lb

## 2014-09-07 DIAGNOSIS — M899 Disorder of bone, unspecified: Secondary | ICD-10-CM

## 2014-09-07 DIAGNOSIS — C50919 Malignant neoplasm of unspecified site of unspecified female breast: Secondary | ICD-10-CM

## 2014-09-07 DIAGNOSIS — C50411 Malignant neoplasm of upper-outer quadrant of right female breast: Secondary | ICD-10-CM

## 2014-09-07 DIAGNOSIS — M8580 Other specified disorders of bone density and structure, unspecified site: Secondary | ICD-10-CM

## 2014-09-07 LAB — COMPREHENSIVE METABOLIC PANEL (CC13)
ALBUMIN: 3.8 g/dL (ref 3.5–5.0)
ALT: 29 U/L (ref 0–55)
ANION GAP: 8 meq/L (ref 3–11)
AST: 22 U/L (ref 5–34)
Alkaline Phosphatase: 72 U/L (ref 40–150)
BUN: 8.4 mg/dL (ref 7.0–26.0)
CHLORIDE: 107 meq/L (ref 98–109)
CO2: 28 meq/L (ref 22–29)
Calcium: 9.7 mg/dL (ref 8.4–10.4)
Creatinine: 0.8 mg/dL (ref 0.6–1.1)
EGFR: >90 mL/min/{1.73_m2} (ref >90)
GLUCOSE: 92 mg/dL (ref 70–140)
POTASSIUM: 3.7 meq/L (ref 3.5–5.1)
Sodium: 143 mEq/L (ref 136–145)
Total Bilirubin: 0.36 mg/dL (ref 0.20–1.20)
Total Protein: 6.9 g/dL (ref 6.4–8.3)

## 2014-09-07 LAB — CBC WITH DIFFERENTIAL/PLATELET
BASO%: 0.8 % (ref 0.0–2.0)
BASOS ABS: 0 10*3/uL (ref 0.0–0.1)
EOS ABS: 0.3 10*3/uL (ref 0.0–0.5)
EOS%: 7.8 % — ABNORMAL HIGH (ref 0.0–7.0)
HCT: 40.9 % (ref 34.8–46.6)
HEMOGLOBIN: 13.5 g/dL (ref 11.6–15.9)
LYMPH#: 1 10*3/uL (ref 0.9–3.3)
LYMPH%: 24.7 % (ref 14.0–49.7)
MCH: 29.5 pg (ref 25.1–34.0)
MCHC: 32.9 g/dL (ref 31.5–36.0)
MCV: 89.6 fL (ref 79.5–101.0)
MONO#: 0.4 10*3/uL (ref 0.1–0.9)
MONO%: 9.4 % (ref 0.0–14.0)
NEUT#: 2.2 10*3/uL (ref 1.5–6.5)
NEUT%: 57.3 % (ref 38.4–76.8)
Platelets: 285 10*3/uL (ref 145–400)
RBC: 4.57 10*6/uL (ref 3.70–5.45)
RDW: 13.1 % (ref 11.2–14.5)
WBC: 3.9 10*3/uL (ref 3.9–10.3)

## 2014-09-07 NOTE — Progress Notes (Signed)
[place]  Telephone:([phone]) [phone] Fax:(336) [phone]     ID: Elizabeth Miles DOB: 05-14-1965  MR#: [id number]  BTY#:[id number]  Patient Care Team: Clinton Quant, MD as PCP - General (Internal Medicine) Clinton Quant, MD as Referring Physician (Internal Medicine) Rolm Bookbinder, MD as Consulting Physician (General Surgery) Chauncey Cruel, MD as Consulting Physician (Oncology) Thea Silversmith, MD as Consulting Physician (Radiation Oncology) San Morelle, MD as Consulting Physician (Gynecology) GYN: Servando Salina MD  CHIEF COMPLAINT: right breat cancer  CURRENT TREATMENT: Observation  BREAST CANCER HISTORY: From Dr. Dana Allan earlier notes:  "Elizabeth Miles is a 49 y.o. female. Without significant past medical history who on June 2013 had a mammogram that was normal. But there was on physical exam possibility of a cyst noted in the right breast. The mammogram was negative. In 2014 June patient noted on exam another lump in the right breast. She underwent a diagnostic mammogram on June 10 that showed a right breast nodule in the outer quadrant. She had an ultrasound performed that showed at the 9:30 o'clock position a 3.6 cm area and then added 10:00 position 1.3 cm area with a total area being anywhere between 5-6 cm. The patient went on to have a right breast biopsy performed in [place]. The pathology revealed an invasive ductal carcinoma. This is been confirmed by our pathology as well. The carcinoma and papillary features and was felt to be between a grade 1 and 2. The tumor was estrogen receptor positive strongly (100%) progesterone receptor negative HER-2/neu negative with a Ki-67 that showed a high proliferation rate. Patient is now seen in medical oncology for discussion of treatment options."  Bilateral breast MRI on 08/27/2012 revealed in the right upper quadrant irregular lobulated mass with a satellite nodule or lobulation within 2 mm at its  superior aspect, measured together as 4.5 x 4.0 x 3.8 cm. There was extension of enhancement to the nipple, suggesting nipple involvement may be present. No lymphadenopathy was noted there was no any other area of abnormal enhancement in either breast (clinical stage IIA, T2 N0).  PET scan performed on 08/29/2012 revealed the primary breast cancer measuring 3.2 x 3.3 cm with SUV of 16. It was adjacent nodule along the superiomedial border of the primary mass measuring 1.4 cm which was also hypermetabolic. There were no additional areas of abnormal hypermetabolism. No abnormal hypermetabolic activity was seen in the chest, abdomen/pelvis,within the liver, pancreas, adrenal glands or spleen. No hypermetabolic lymph nodes. In the skeleton, no focal hypermetabolic activity to suggest skeletal metastasis was seen.  Completed neoadjuvant chemotherapy consisting of Q14 day Adriamycin/Cytoxan x 4 cycles on 10/25/2012, followed by one dose of neoadjuvant Taxol on 11/08/2012. She developed significant grade 2 neuropathy and Taxol was discontinued. Then received neoadjuvant chemotherapy consisting of single agent Abraxane given on day 1, 8, 15 of each 28 day cycle. She completed therapy on 11/15/12 - 02/14/13.   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 Repeat HER-2 testing was now positive. The patient was started on tamoxifen March 2015 and on Herceptin the same month.  Her subsequent history is as detailed below.  INTERVAL HISTORY: Elizabeth Miles returns to clinic today for follow up of her breast cancer, accompanied by her mother. We had given her 2 months "off" hoping that she would feel better but she really is not  feeling any better. She hurts all over. Her fingers hurt. They're very stiff. Her feet hurt, both front and back. Her calves hurt. She is tired all the  time. She spends a good deal of time in bed. She is gaining weight. In addition to the pain in the lower extremities she has pain in the right axilla area. This worries her. "I don't feel good".  REVIEW OF SYSTEMS: Madhavi has started going to see a Social worker in [place]. She has been told she is depressed. She refused to go to a psychiatrist because she doesn't want to take any medication. She does volunteer in her church about once a week and she does take her son to [employer]. She has a new grandbaby, which is very positive. Her husband is currently in [place] working in the hospital construction project. She has 2 daughters and her son at home currently. Aside from all the above she had pain in her right jaw. She went to the emergency room at [facility] for evaluation. This started her on amoxicillin for a possible tooth abscess. That pain is better. She was supposed to get clearance from her dentist regarding zolendronate, but has not seen a dentist. Detailed review of systems today was otherwise stable.   PAST MEDICAL HISTORY: Past Medical History  Diagnosis Date  . 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 GERD (gastroesophageal reflux disease)   . GERD (gastroesophageal reflux disease) 08/22/2012  . Allergy   . Complication of anesthesia     1986 ; problem waking up  . Anxiety   . Wears glasses     PAST SURGICAL HISTORY: Past Surgical History  Procedure Laterality Date  . Dilation and curettage of uterus  1993  . Cervical ablation  1983  . Breast biopsy Right 08/06/12  . Popliteal synovial cyst excision  1970  . Portacath placement Left 09/12/2012    Procedure: INSERTION PORT-A-CATH;  Surgeon: Rolm Bookbinder, MD;  Location: [facility];  Service: General;  Laterality: Left;  . Breast lumpectomy with needle localization and axillary sentinel lymph node bx Right 03/10/2013    Procedure: BREAST LUMPECTOMY WITH NEEDLE LOCALIZATION AND AXILLARY SENTINEL LYMPH NODE BIOPSY;   Surgeon: Rolm Bookbinder, MD;  Location: [facility];  Service: General;  Laterality: Right;  . Port-a-cath removal Left 03/10/2013    Procedure: REMOVAL PORT-A-CATH;  Surgeon: Rolm Bookbinder, MD;  Location: [facility];  Service: General;  Laterality: Left;    FAMILY HISTORY Family History  Problem Relation Age of Onset  . Hypertension Mother   . Bladder Cancer Maternal Uncle 68  . Cancer Paternal Grandmother 22    lung cancer  . Lung cancer Paternal Grandfather     dx in his 61s  . COPD Paternal Uncle    the patient's parents are living and in good health. The patient has one brother, no sisters. There is no history of breast or ovarian cancer in the family to her knowledge  GYNECOLOGIC HISTORY:  Patient's last menstrual period was 03/12/2012. Menarche age 19, first live birth age 57. The patient is GX P3. 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 Her husband, Jori Moll, is a Tour manager. A daughter, Lovena Le, 82, lives in [place] and daughter Caryl Pina just had a baby girl, the patient's first grandchild. Son Jori Moll "Carlynn Spry" is 6 and at home.  The patient is a Psychologist, forensic.    ADVANCED DIRECTIVES: Not in place   HEALTH MAINTENANCE: History  Substance Use Topics  . Smoking status: Never Smoker   . Smokeless tobacco: Never Used  . Alcohol Use: No     Colonoscopy:  PAP:  Bone density:  Lipid panel:  Allergies  Allergen Reactions  . Darvocet [Propoxyphene N-Acetaminophen] Shortness Of Breath and Swelling  . Shellfish Allergy Shortness Of Breath and Swelling  . Other     CT dye, "chest hurt" "makes me feel cold"    Current Outpatient Prescriptions  Medication Sig Dispense Refill  . acetaminophen (TYLENOL) 500 MG tablet Take 1,000 mg by mouth every 6 (six) hours as needed.    Marland Kitchen aspirin 325 MG  tablet Take 325 mg by mouth daily.    . cetirizine (ZYRTEC) 10 MG tablet Take 10 mg by mouth daily.    Marland Kitchen Dexlansoprazole (DEXILANT) 30 MG capsule Take 1 capsule (30 mg total) by mouth daily. 90 capsule 4  . ibuprofen (ADVIL,MOTRIN) 800 MG tablet Take 800 mg by mouth every 8 (eight) hours as needed for moderate pain (for back pain).    Marland Kitchen ketoconazole (NIZORAL) 2 % cream Apply 1 application topically daily. 15 g 0  . montelukast (SINGULAIR) 10 MG tablet Take 1 tablet by mouth at bedtime.  2   No current facility-administered medications for this visit.    OBJECTIVE: Middle-aged white woman who appears listless Filed Vitals:   09/07/14 1030  BP: 119/80  Pulse: 81  Temp: 97.9 F (36.6 C)  Resp: 18     Body mass index is 35.12 kg/(m^2).    ECOG FS:2 - Symptomatic, <50% confined to bed  Sclerae unicteric, EOMs intact Oropharynx clear and moist No cervical or supraclavicular adenopathy Lungs no rales or rhonchi Heart regular rate and rhythm Abd soft, obese, nontender, positive bowel sounds MSK no focal spinal tenderness, no upper extremity lymphedema Neuro: nonfocal, well oriented, appropriate affect Breasts: The right breast is status post lumpectomy and radiation. There is minimal distortion of the breast contour but there is no evidence of disease recurrence. The right axilla is benign. The left breast is unremarkable.  LAB RESULTS:  CMP     Component Value Date/Time   NA 142 07/06/2014 0916   NA 144 08/17/2013 0346   K 4.3 07/06/2014 0916   K 3.8 08/17/2013 0346   CL 105 08/17/2013 0346   CO2 25 07/06/2014 0916   GLUCOSE 91 07/06/2014 0916   GLUCOSE 155* 08/17/2013 0346   BUN 12.1 07/06/2014 0916   BUN 8 08/17/2013 0346   CREATININE 0.7 07/06/2014 0916   CREATININE 0.80 08/17/2013 0346   CALCIUM 9.1 07/06/2014 0916   PROT 6.7 07/06/2014 0916   ALBUMIN 3.7 07/06/2014 0916   AST 14 07/06/2014 0916   ALT 19 07/06/2014 0916   ALKPHOS 66 07/06/2014 0916   BILITOT 0.27  07/06/2014 0916    I No results found for: SPEP  Lab Results  Component Value Date   WBC 3.9 09/07/2014   NEUTROABS 2.2 09/07/2014   HGB 13.5 09/07/2014   HCT 40.9 09/07/2014   MCV 89.6 09/07/2014   PLT 285 09/07/2014      Chemistry      Component Value Date/Time   NA 142 07/06/2014 0916   NA 144 08/17/2013 0346   K 4.3 07/06/2014 0916   K 3.8 08/17/2013 0346   CL 105 08/17/2013 0346   CO2 25 07/06/2014 0916   BUN 12.1 07/06/2014  0916   BUN 8 08/17/2013 0346   CREATININE 0.7 07/06/2014 0916   CREATININE 0.80 08/17/2013 0346      Component Value Date/Time   CALCIUM 9.1 07/06/2014 0916   ALKPHOS 66 07/06/2014 0916   AST 14 07/06/2014 0916   ALT 19 07/06/2014 0916   BILITOT 0.27 07/06/2014 0916       No results found for: LABCA2  No components found for: LABCA125  No results for input(s): INR in the last 168 hours.  Urinalysis    Component Value Date/Time   LABSPEC 1.010 12/13/2012 1435   PHURINE 7.0 12/13/2012 1435   GLUCOSEU Negative 12/13/2012 1435   HGBUR Trace 12/13/2012 1435   BILIRUBINUR Negative 12/13/2012 1435   KETONESUR Negative 12/13/2012 1435   PROTEINUR Negative 12/13/2012 1435   UROBILINOGEN 0.2 12/13/2012 1435   NITRITE Negative 12/13/2012 1435   LEUKOCYTESUR Small 12/13/2012 1435    STUDIES: Bone density at [facility] 06/10/2014 shows osteopenia with a T score of -1.7.  ASSESSMENT: 49 y.o. [place] woman  (1) status post right breast biopsy 08/06/2012 for a clinical mT2 N0, stage IIA invasive ductal carcinoma, estrogen receptor 3+ positive, progesterone receptor and HER-2 negative, Ki67 3+ [[id number]]  (2) treated neoadjuvantly with dose dense cyclophosphamide and doxorubicin x4, completed 10/25/2012, followed by a single dose of weekly paclitaxel, poorly tolerated; followed by 11 doses of Abraxane completed 02/14/2013  (3) status post right lumpectomy and sentinel lymph node sampling 03/10/2013 for a pT2 pN0, stage IIA  invasive ductal carcinoma, grade 2, estrogen receptor 100% positive, progesterone receptor 21% positive, with an MIB-1 of 14%, and HER-2 amplified, the signals ratio being 2.52, the number per cell 2.90  (4) adjuvant radiation completed March 2015 (?)  (5) started tamoxifen March 2015, stopped 05/25/14 because of superficial clot to left cephalic vein; began anastrozole on 05/29/14, discontinued after 2 weeks with multiple side effects  (6) started trastuzumab 05/23/2013, completed 05/15/14  (7) osteopenia: waiting on dental clearance to start zolendronate   PLAN: Anureet's multiple areas of pain are not easily explained by her history. I wonder if she has a primary rheumatologic diagnosis or may be simple fibromyalgia. We discussed this and she agreed to see a rheumatologist. I'm going ahead and placing a referral for her.  We again discussed the fact that she really needs to be on anti-estrogens. This will cut her risk of breast cancer recurrence in half. However I don't think she would be able to tolerate letrozole or exemestane, either of which can also cause aches and pains here and there, until she gets the arthritis discomforts that she is experiencing under some kind of control.  I offered to start her on venlafaxine, and encouraged her to start an exercise program (if she can't walk daily because of the discomfort in her ankles she can either swim or do stationary bike).. What she would like to do though is go to the beach for a week, which is what she generally does when she gets herself into these holes. I also encouraged her to continue to see the counselor she has started seeing in [place] (she did not recall the name of the group the counselor belongs to).  From a breast cancer point of view I am going to see her again in 3 months. She does need to get dental clearance before I can start her on bisphosphonates. I am hopeful to be able to start her on letrozole at the next  visit. Chauncey Cruel, MD  09/07/2014 10:54 AM

## 2014-09-07 NOTE — Telephone Encounter (Signed)
Gave patient avs report and appointments for November - no availability in October - patient ok with November. Left message for new patient coordinator at Dr. Melissa Noon office requesting appointment. Patient aware she will be contacted re appointment with Dr. Amil Amen. Copy of referral sent to HIM with fax coversheet to send notes/demographics/insurance information to Dr. Melissa Noon office at [phone]. Info sent to this number per request on the new patient coordinator voicemail at Dr. Melissa Noon office.

## 2014-09-08 ENCOUNTER — Other Ambulatory Visit: Payer: Self-pay | Admitting: Oncology

## 2014-09-08 ENCOUNTER — Other Ambulatory Visit: Payer: Self-pay | Admitting: *Deleted

## 2014-09-08 DIAGNOSIS — M8580 Other specified disorders of bone density and structure, unspecified site: Secondary | ICD-10-CM

## 2014-09-08 DIAGNOSIS — C50411 Malignant neoplasm of upper-outer quadrant of right female breast: Secondary | ICD-10-CM

## 2014-09-08 LAB — VITAMIN D 25 HYDROXY (VIT D DEFICIENCY, FRACTURES): Vit D, 25-Hydroxy: 26 ng/mL — ABNORMAL LOW (ref 30–100)

## 2014-09-08 MED ORDER — VITAMIN D 1000 UNITS PO TABS: 1000.0000 [IU] | ORAL_TABLET | Freq: Every day | ORAL | Status: DC

## 2014-09-11 ENCOUNTER — Telehealth: Payer: Self-pay | Admitting: *Deleted

## 2014-09-11 NOTE — Telephone Encounter (Signed)
Pt called to say she has a prescription for Vitamin D and is not sure why. Explained that Dr Jana Hakim drew Vit D level and it was low. Pt verbalized understanding

## 2014-11-23 ENCOUNTER — Other Ambulatory Visit: Payer: Self-pay | Admitting: Oncology

## 2014-11-27 ENCOUNTER — Other Ambulatory Visit: Payer: Self-pay | Admitting: *Deleted

## 2014-11-27 MED ORDER — KETOCONAZOLE 2 % EX CREA: TOPICAL_CREAM | CUTANEOUS | Status: DC

## 2014-12-07 IMAGING — CT NM PET TUM IMG INITIAL (PI) SKULL BASE T - THIGH
5 series · 25 of 25 positions shown · IV contrast ([ID])
Comparison: CT chest abdomen pelvis 08/29/2012.

CLINICAL DATA: Initial treatment strategy for breast cancer.

NUCLEAR MEDICINE PET SKULL BASE TO THIGH
Fasting Blood Glucose:  88
TECHNIQUE: 17.6 mCi F-18 FDG was injected intravenously. CT data
was obtained and used for attenuation correction and anatomic
localization only.  (This was not acquired as a diagnostic CT
examination.) Additional exam technical data entered on
technologist worksheet.

[Series 2: ct images · axial · 3.8mm · 0.98mm/px · z∈[-933,-63]mm · 7 of 267 slices shown]
[im 1/267]
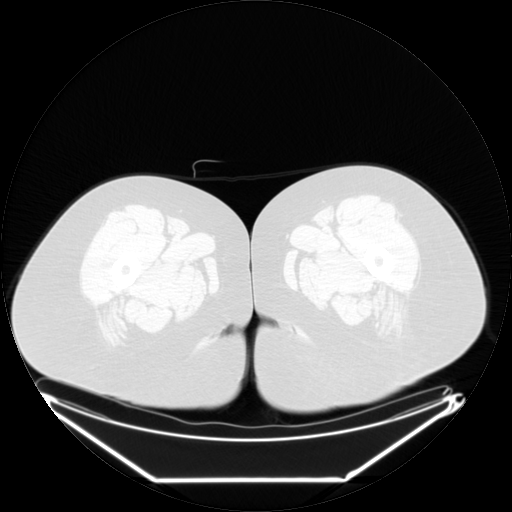
[im 45/267]
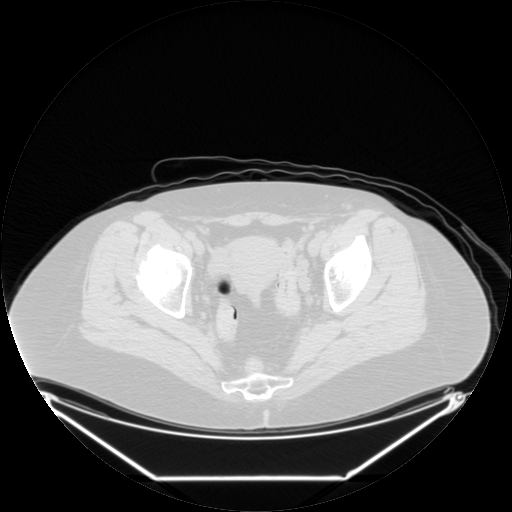
[im 89/267]
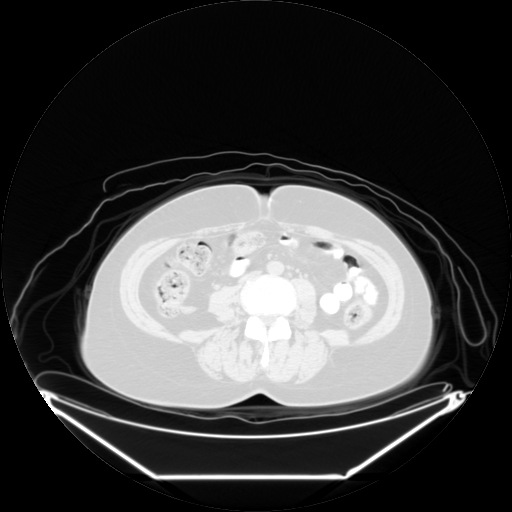
[im 134/267]
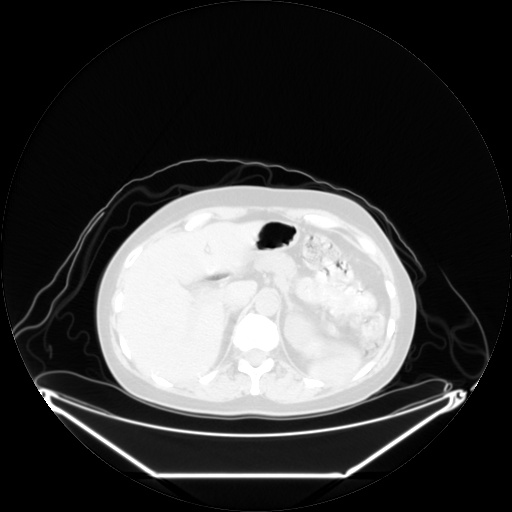
[im 178/267]
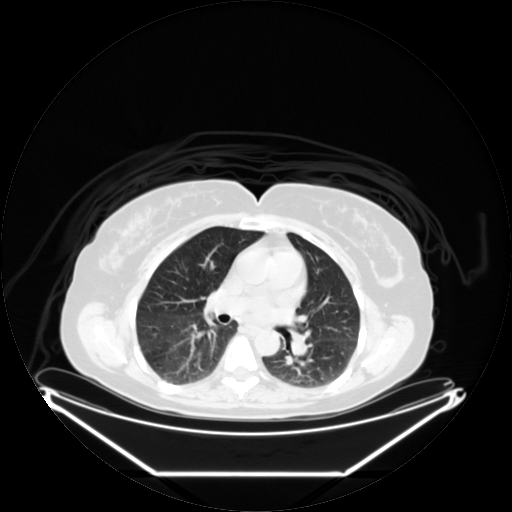
[im 222/267]
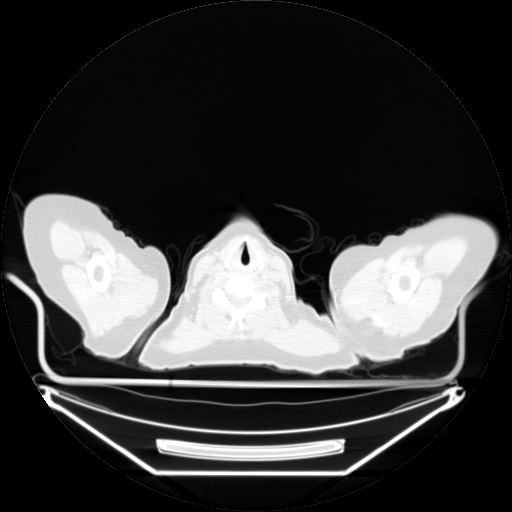
[im 267/267  brain]
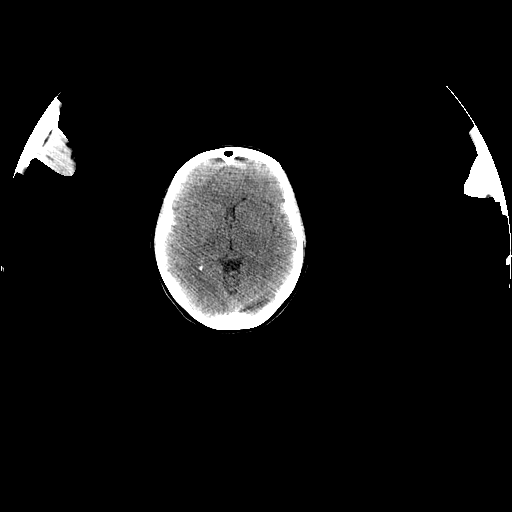

[Series 2: pet nac · axial · 3.3mm · 4.69mm/px · z∈[-933,-63]mm · 8 of 267 slices shown]
[im 1/267]
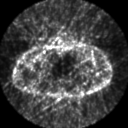
[im 39/267]
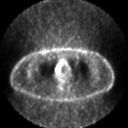
[im 77/267]
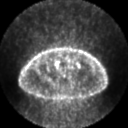
[im 115/267]
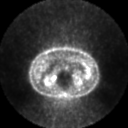
[im 153/267]
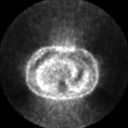
[im 191/267]
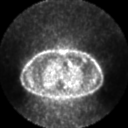
[im 229/267]
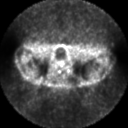
[im 267/267]
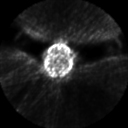

[Series 123: mip · coronal · 3.3mm · 4.69mm/px · 1 of 30 slices shown]
[im 1/30]
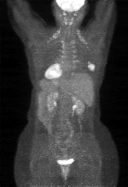

[Series 151: reformatted · axial · 3.3mm · 3.91mm/px · z∈[-933,-63]mm · 7 of 265 slices shown (1 of 2)]
[im 1/265]
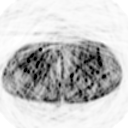
[im 45/265]
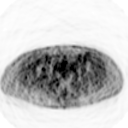
[im 89/265]
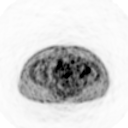
[im 133/265]
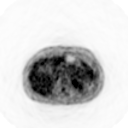
[im 177/265]
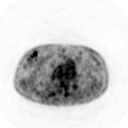
[im 221/265]
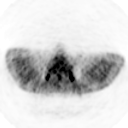
[im 265/265]
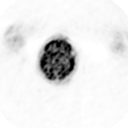

[Series 153: reformatted · coronal · 4.7mm · 6.98mm/px · 2 of 61 slices shown (2 of 2)]
[im 1/61]
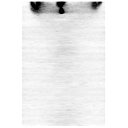
[im 61/61]
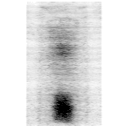

[25 of 25 positions shown; findings below may reference images not displayed]

FINDINGS: Neck: No hypermetabolic lymph nodes in the neck.  Hypermetabolic
brown fat is noted.  CT images show no acute findings.

Chest:  No hypermetabolic mediastinal or hilar lymph nodes.  No
suspicious pulmonary nodules.  There is hypermetabolic brown fat.

Primary right breast mass measures 3.2 x 3.3 cm with an S U V max
of 16.0.  There is an adjacent nodule along the superomedial border
of the primary mass, measuring 1.4 cm, also hypermetabolic.  No
additional areas of abnormal hypermetabolism in the chest.  CT
images show no acute findings.  Specifically, no pericardial or
pleural effusion.

Abdomen/Pelvis:  No abnormal hypermetabolic activity within the
liver, pancreas, adrenal glands or spleen.  No hypermetabolic lymph
nodes.  CT images show no acute findings.

Skeleton:  No focal hypermetabolic activity to suggest skeletal
metastasis.
IMPRESSION: Hypermetabolic right breast lesions, consistent with the given
history breast cancer, without evidence of metastatic disease.

## 2015-01-08 ENCOUNTER — Other Ambulatory Visit: Payer: Self-pay

## 2015-01-08 DIAGNOSIS — C50411 Malignant neoplasm of upper-outer quadrant of right female breast: Secondary | ICD-10-CM

## 2015-01-11 ENCOUNTER — Other Ambulatory Visit (HOSPITAL_BASED_OUTPATIENT_CLINIC_OR_DEPARTMENT_OTHER): Payer: BLUE CROSS/BLUE SHIELD

## 2015-01-11 ENCOUNTER — Ambulatory Visit (HOSPITAL_BASED_OUTPATIENT_CLINIC_OR_DEPARTMENT_OTHER): Payer: BLUE CROSS/BLUE SHIELD | Admitting: Oncology

## 2015-01-11 ENCOUNTER — Other Ambulatory Visit: Payer: Self-pay | Admitting: *Deleted

## 2015-01-11 ENCOUNTER — Telehealth: Payer: Self-pay | Admitting: Oncology

## 2015-01-11 VITALS — BP 120/81 | HR 85 | Temp 98.0°F | Resp 17 | Ht 67.0 in | Wt 213.6 lb

## 2015-01-11 DIAGNOSIS — C50411 Malignant neoplasm of upper-outer quadrant of right female breast: Secondary | ICD-10-CM

## 2015-01-11 DIAGNOSIS — G609 Hereditary and idiopathic neuropathy, unspecified: Secondary | ICD-10-CM

## 2015-01-11 LAB — COMPREHENSIVE METABOLIC PANEL (CC13)
ALK PHOS: 90 U/L (ref 40–150)
ALT: 17 U/L (ref 0–55)
AST: 16 U/L (ref 5–34)
Albumin: 3.9 g/dL (ref 3.5–5.0)
Anion Gap: 9 mEq/L (ref 3–11)
BUN: 9.7 mg/dL (ref 7.0–26.0)
CHLORIDE: 105 meq/L (ref 98–109)
CO2: 26 mEq/L (ref 22–29)
Calcium: 9.7 mg/dL (ref 8.4–10.4)
Creatinine: 0.8 mg/dL (ref 0.6–1.1)
EGFR: 83 mL/min/{1.73_m2} — ABNORMAL LOW (ref >90)
GLUCOSE: 95 mg/dL (ref 70–140)
POTASSIUM: 3.9 meq/L (ref 3.5–5.1)
SODIUM: 140 meq/L (ref 136–145)
Total Bilirubin: 0.37 mg/dL (ref 0.20–1.20)
Total Protein: 7.3 g/dL (ref 6.4–8.3)

## 2015-01-11 LAB — CBC WITH DIFFERENTIAL/PLATELET
BASO%: 0.6 % (ref 0.0–2.0)
BASOS ABS: 0 10*3/uL (ref 0.0–0.1)
EOS ABS: 0.2 10*3/uL (ref 0.0–0.5)
EOS%: 4.2 % (ref 0.0–7.0)
HCT: 41.4 % (ref 34.8–46.6)
HGB: 13.8 g/dL (ref 11.6–15.9)
LYMPH%: 24.4 % (ref 14.0–49.7)
MCH: 29.9 pg (ref 25.1–34.0)
MCHC: 33.3 g/dL (ref 31.5–36.0)
MCV: 89.8 fL (ref 79.5–101.0)
MONO#: 0.5 10*3/uL (ref 0.1–0.9)
MONO%: 9.8 % (ref 0.0–14.0)
NEUT#: 2.9 10*3/uL (ref 1.5–6.5)
NEUT%: 61 % (ref 38.4–76.8)
Platelets: 318 10*3/uL (ref 145–400)
RBC: 4.61 10*6/uL (ref 3.70–5.45)
RDW: 12.7 % (ref 11.2–14.5)
WBC: 4.7 10*3/uL (ref 3.9–10.3)
lymph#: 1.2 10*3/uL (ref 0.9–3.3)

## 2015-01-11 MED ORDER — VENLAFAXINE HCL ER 75 MG PO CP24: 75.0000 mg | ORAL_CAPSULE | Freq: Every day | ORAL | Status: DC

## 2015-01-11 NOTE — Progress Notes (Signed)
Elizabeth Miles  Telephone:([phone]) [phone] Fax:(336) [phone]     ID: NELANI SCHMELZLE DOB: 08/10/1965  MR#: [id number]  TMH#:[id number]  Patient Care Team: Clinton Quant, MD as PCP - General (Internal Medicine) Clinton Quant, MD as Referring Physician (Internal Medicine) Rolm Bookbinder, MD as Consulting Physician (General Surgery) Chauncey Cruel, MD as Consulting Physician (Oncology) Thea Silversmith, MD as Consulting Physician (Radiation Oncology) GYN: Elizabeth Salina MD  CHIEF COMPLAINT: right breat cancer  CURRENT TREATMENT: Observation, pending anti-estrogens  BREAST CANCER HISTORY: From Dr. Dana Allan earlier notes:  "Elizabeth Miles is a 49 y.o. female. Without significant past medical history who on June 2013 had a mammogram that was normal. But there was on physical exam possibility of a cyst noted in the right breast. The mammogram was negative. In 2014 June patient noted on exam another lump in the right breast. She underwent a diagnostic mammogram on June 10 that showed a right breast nodule in the outer quadrant. She had an ultrasound performed that showed at the 9:30 o'clock position a 3.6 cm area and then added 10:00 position 1.3 cm area with a total area being anywhere between 5-6 cm. The patient went on to have a right breast biopsy performed in [place]. The pathology revealed an invasive ductal carcinoma. This is been confirmed by our pathology as well. The carcinoma and papillary features and was felt to be between a grade 1 and 2. The tumor was estrogen receptor positive strongly (100%) progesterone receptor negative HER-2/neu negative with a Ki-67 that showed a high proliferation rate. Patient is now seen in medical oncology for discussion of treatment options."  Bilateral breast MRI on 08/27/2012 revealed in the right upper quadrant irregular lobulated mass with a satellite nodule or lobulation within 2 mm at its superior aspect, measured  together as 4.5 x 4.0 x 3.8 cm. There was extension of enhancement to the nipple, suggesting nipple involvement may be present. No lymphadenopathy was noted there was no any other area of abnormal enhancement in either breast (clinical stage IIA, T2 N0).  PET scan performed on 08/29/2012 revealed the primary breast cancer measuring 3.2 x 3.3 cm with SUV of 16. It was adjacent nodule along the superiomedial border of the primary mass measuring 1.4 cm which was also hypermetabolic. There were no additional areas of abnormal hypermetabolism. No abnormal hypermetabolic activity was seen in the chest, abdomen/pelvis,within the liver, pancreas, adrenal glands or spleen. No hypermetabolic lymph nodes. In the skeleton, no focal hypermetabolic activity to suggest skeletal metastasis was seen.  Completed neoadjuvant chemotherapy consisting of Q14 day Adriamycin/Cytoxan x 4 cycles on 10/25/2012, followed by one dose of neoadjuvant Taxol on 11/08/2012. She developed significant grade 2 neuropathy and Taxol was discontinued. Then received neoadjuvant chemotherapy consisting of single agent Abraxane given on day 1, 8, 15 of each 28 day cycle. She completed therapy on 11/15/12 - 02/14/13.   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 Repeat HER-2 testing was now positive. The patient was started on tamoxifen March 2015 and on Herceptin the same month.  Her subsequent history is as detailed below.  INTERVAL HISTORY: Elizabeth Miles returns to clinic today for follow up of her breast cancer. 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 She tries to work out on  the treadmill for 30 minutes perhaps once a week. Her feet are not burning as much as before, but especially in the morning when she gets up from bed they are achy  and it takes a while before they feel better. She is not having hot flashes or vaginal dryness problems. She does have a runny nose and occasional mild epistaxis when she blows her nose. She has mild ankle swelling. She has some stress urinary incontinent symptoms. Her skin is dry, her eyes are dry, and her joints feel dry.  REVIEW OF SYSTEMS: A detailed review of systems was otherwise noncontributory  PAST MEDICAL HISTORY: Past Medical History  Diagnosis Date  . 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 GERD (gastroesophageal reflux disease)   . GERD (gastroesophageal reflux disease) 08/22/2012  . Allergy   . Complication of anesthesia     1986 ; problem waking up  . Anxiety   . Wears glasses     PAST SURGICAL HISTORY: Past Surgical History  Procedure Laterality Date  . Dilation and curettage of uterus  1993  . Cervical ablation  1983  . Breast biopsy Right 08/06/12  . Popliteal synovial cyst excision  1970  . Portacath placement Left 09/12/2012    Procedure: INSERTION PORT-A-CATH;  Surgeon: Rolm Bookbinder, MD;  Location: [facility];  Service: General;  Laterality: Left;  . Breast lumpectomy with needle localization and axillary sentinel lymph node bx Right 03/10/2013    Procedure: BREAST LUMPECTOMY WITH NEEDLE LOCALIZATION AND AXILLARY SENTINEL LYMPH NODE BIOPSY;  Surgeon: Rolm Bookbinder, MD;  Location: [facility];  Service: General;  Laterality: Right;  . Port-a-cath removal Left 03/10/2013    Procedure: REMOVAL PORT-A-CATH;  Surgeon: Rolm Bookbinder, MD;  Location: [facility];  Service: General;  Laterality: Left;    FAMILY HISTORY Family History  Problem Relation Age of Onset  . Hypertension Mother   . Bladder Cancer Maternal Uncle 68  . Cancer Paternal Grandmother 22    lung cancer  . Lung cancer Paternal Grandfather     dx in his 36s  . COPD Paternal Uncle    the patient's parents are living and in good health. The patient has  one brother, no sisters. There is no history of breast or ovarian cancer in the family to her knowledge  GYNECOLOGIC HISTORY:  Patient's last menstrual period was 03/12/2012. Menarche age 72, first live birth age 56. The patient is GX P3. 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 Her husband, Elizabeth Miles, is a Tour manager. A daughter, Elizabeth Miles, 79, lives in [place] and daughter Elizabeth Miles just had a baby girl, the patient's first grandchild. Son Elizabeth Miles "Carlynn Spry" is 29 and at home. The patient is a Psychologist, forensic.    ADVANCED DIRECTIVES: Not in place   HEALTH MAINTENANCE: Social History  Substance Use Topics  . Smoking status: Never Smoker   . Smokeless tobacco: Never Used  . Alcohol Use: No     Colonoscopy:  PAP:  Bone density:  Lipid panel:  Allergies  Allergen Reactions  . Darvocet [Propoxyphene N-Acetaminophen] Shortness Of Breath and Swelling  . Shellfish Allergy Shortness Of Breath and Swelling  . Other     CT dye, "chest hurt" "makes me feel cold"    Current Outpatient Prescriptions  Medication Sig Dispense Refill  . acetaminophen (TYLENOL) 500 MG tablet Take  1,000 mg by mouth every 6 (six) hours as needed.    Marland Kitchen aspirin 325 MG tablet Take 325 mg by mouth daily.    . cetirizine (ZYRTEC) 10 MG tablet Take 10 mg by mouth daily.    . cholecalciferol (VITAMIN D) 1000 UNITS tablet Take 1 tablet (1,000 Units total) by mouth daily. 90 tablet 3  . Dexlansoprazole (DEXILANT) 30 MG capsule Take 1 capsule (30 mg total) by mouth daily. 90 capsule 4  . ibuprofen (ADVIL,MOTRIN) 800 MG tablet Take 800 mg by mouth every 8 (eight) hours as needed for moderate pain (for back pain).    Marland Kitchen ketoconazole (NIZORAL) 2 % cream APPLY 1 APPLICATION TOPICALLY DAILY. 15 g 0  . venlafaxine XR (EFFEXOR-XR) 75 MG 24 hr capsule Take 1 capsule (75 mg total) by  mouth daily with breakfast. 30 capsule 12   No current facility-administered medications for this visit.    OBJECTIVE: Middle-aged white woman in no acute distress Filed Vitals:   01/11/15 1156  BP: 120/81  Pulse: 85  Temp: 98 F (36.7 C)  Resp: 17     Body mass index is 33.45 kg/(m^2).    ECOG FS:1 - Symptomatic but completely ambulatory  Sclerae unicteric, pupils round and equal Oropharynx clear and moist-- no thrush or other lesions No cervical or supraclavicular adenopathy Lungs no rales or rhonchi Heart regular rate and rhythm Abd soft, nontender, positive bowel sounds MSK no focal spinal tenderness, no upper extremity lymphedema Neuro: nonfocal, well oriented, appropriate affect Breasts: The right breast is status post lumpectomy and radiation. There are no skin or nipple changes of concern. The right axilla is benign. The left breast is unremarkable  LAB RESULTS:  CMP     Component Value Date/Time   NA 140 01/11/2015 1131   NA 144 08/17/2013 0346   K 3.9 01/11/2015 1131   K 3.8 08/17/2013 0346   CL 105 08/17/2013 0346   CO2 26 01/11/2015 1131   GLUCOSE 95 01/11/2015 1131   GLUCOSE 155* 08/17/2013 0346   BUN 9.7 01/11/2015 1131   BUN 8 08/17/2013 0346   CREATININE 0.8 01/11/2015 1131   CREATININE 0.80 08/17/2013 0346   CALCIUM 9.7 01/11/2015 1131   PROT 7.3 01/11/2015 1131   ALBUMIN 3.9 01/11/2015 1131   AST 16 01/11/2015 1131   ALT 17 01/11/2015 1131   ALKPHOS 90 01/11/2015 1131   BILITOT 0.37 01/11/2015 1131    I No results found for: SPEP  Lab Results  Component Value Date   WBC 4.7 01/11/2015   NEUTROABS 2.9 01/11/2015   HGB 13.8 01/11/2015   HCT 41.4 01/11/2015   MCV 89.8 01/11/2015   PLT 318 01/11/2015      Chemistry      Component Value Date/Time   NA 140 01/11/2015 1131   NA 144 08/17/2013 0346   K 3.9 01/11/2015 1131   K 3.8 08/17/2013 0346   CL 105 08/17/2013 0346   CO2 26 01/11/2015 1131   BUN 9.7 01/11/2015 1131   BUN 8  08/17/2013 0346   CREATININE 0.8 01/11/2015 1131   CREATININE 0.80 08/17/2013 0346      Component Value Date/Time   CALCIUM 9.7 01/11/2015 1131   ALKPHOS 90 01/11/2015 1131   AST 16 01/11/2015 1131   ALT 17 01/11/2015 1131   BILITOT 0.37 01/11/2015 1131       No results found for: LABCA2  No components found for: LABCA125  No results for input(s): INR in the last 168  hours.  Urinalysis    Component Value Date/Time   LABSPEC 1.010 12/13/2012 1435   PHURINE 7.0 12/13/2012 1435   GLUCOSEU Negative 12/13/2012 1435   HGBUR Trace 12/13/2012 1435   BILIRUBINUR Negative 12/13/2012 1435   KETONESUR Negative 12/13/2012 1435   PROTEINUR Negative 12/13/2012 1435   UROBILINOGEN 0.2 12/13/2012 1435   NITRITE Negative 12/13/2012 1435   LEUKOCYTESUR Small 12/13/2012 1435    STUDIES:  No results found.   ASSESSMENT: 49 y.o. [place] woman  (1) status post right breast biopsy 08/06/2012 for a clinical mT2 N0, stage IIA invasive ductal carcinoma, estrogen receptor 3+ positive, progesterone receptor and HER-2 negative, Ki67 3+ [[id number]]  (2) treated neoadjuvantly with dose dense cyclophosphamide and doxorubicin x4, completed 10/25/2012, followed by a single dose of weekly paclitaxel, poorly tolerated; followed by 11 doses of Abraxane completed 02/14/2013  (3) status post right lumpectomy and sentinel lymph node sampling 03/10/2013 for a ypT2 pN0, stage IIA invasive ductal carcinoma, grade 2, estrogen receptor 100% positive, progesterone receptor 21% positive, with an MIB-1 of 14%, and HER-2 amplified, the signals ratio being 2.52, the number per cell 2.90  (4) adjuvant radiation completed March 2015 (?)  (5) started tamoxifen March 2015, stopped 05/25/14 because of superficial clot to left cephalic vein; began anastrozole on 05/29/14, discontinued after 2 weeks with multiple side effects  (6) started trastuzumab 05/23/2013, completed 05/15/14  (7) osteopenia: With T score  of -1.7 on bone density scan at [facility] 06/10/2014.   (a)waiting on dental clearance to start zolendronate   PLAN: I spent approximately 40 minutes with Elizabeth Miles today. She continues to have multiple symptoms and they are great concern to her. I reassured her that none of them sent to me like they are due to his cancer. Some of them may be due to her prior cancer treatment and those seem to be improving. However the symptoms have not completely resolved.  As a result I don't think we are ready to try anti-estrogens. She did well with tamoxifen except for the clot, but as soon as she started anastrozole she started having symptoms so she doesn't think she would be able to tolerate that in any case. Also she is still having her teeth worked on to we are not ready to start on either prolia or Reclast.  I think she would benefit from venlafaxine. We frequently use this as an adjunct to other medicines and I think she would have less anxiety and also less pain. We discussed the possible toxicities, side effects and complications. I went ahead and wrote the prescription for her pharmacy and encouraged her to try it.   I don't have a simple explanation as to why she sometimes has numbness on her left fifth cranial nerve. This has been evaluated previously by Dr. Lyman Speller in neurology, with negative workup. I offered to send her to a different neurologist but she feels that would probably not be helpful. It does sound like the problem is very intermittent and self-limiting.  I reassured her that the road moles" she is seeing are benign, mostly skin tags and seborrheic keratoses.  I made her a return appointment in 4 months. She will call with any problems that may develop before that visit.  Chauncey Cruel, MD   01/11/2015 6:36 PM

## 2015-01-11 NOTE — Telephone Encounter (Signed)
Appointments made and avs printed for patient °

## 2015-01-12 LAB — VITAMIN D 25 HYDROXY (VIT D DEFICIENCY, FRACTURES): Vit D, 25-Hydroxy: 33 ng/mL (ref 30–100)

## 2015-05-12 DIAGNOSIS — R51 Headache: Secondary | ICD-10-CM

## 2015-05-12 DIAGNOSIS — R519 Headache, unspecified: Secondary | ICD-10-CM | POA: Insufficient documentation

## 2015-05-21 ENCOUNTER — Other Ambulatory Visit: Payer: Self-pay

## 2015-05-21 DIAGNOSIS — C50411 Malignant neoplasm of upper-outer quadrant of right female breast: Secondary | ICD-10-CM

## 2015-05-24 ENCOUNTER — Telehealth: Payer: Self-pay | Admitting: Oncology

## 2015-05-24 ENCOUNTER — Other Ambulatory Visit (HOSPITAL_BASED_OUTPATIENT_CLINIC_OR_DEPARTMENT_OTHER): Payer: BLUE CROSS/BLUE SHIELD

## 2015-05-24 ENCOUNTER — Other Ambulatory Visit: Payer: Self-pay | Admitting: *Deleted

## 2015-05-24 ENCOUNTER — Ambulatory Visit (HOSPITAL_BASED_OUTPATIENT_CLINIC_OR_DEPARTMENT_OTHER): Payer: BLUE CROSS/BLUE SHIELD | Admitting: Oncology

## 2015-05-24 VITALS — BP 136/78 | HR 84 | Temp 98.3°F | Resp 18 | Ht 67.0 in | Wt 202.6 lb

## 2015-05-24 DIAGNOSIS — C50411 Malignant neoplasm of upper-outer quadrant of right female breast: Secondary | ICD-10-CM

## 2015-05-24 DIAGNOSIS — M859 Disorder of bone density and structure, unspecified: Secondary | ICD-10-CM | POA: Diagnosis not present

## 2015-05-24 LAB — CBC WITH DIFFERENTIAL/PLATELET
BASO%: 0.8 % (ref 0.0–2.0)
BASOS ABS: 0 10*3/uL (ref 0.0–0.1)
EOS ABS: 0.2 10*3/uL (ref 0.0–0.5)
EOS%: 3.9 % (ref 0.0–7.0)
HCT: 38.7 % (ref 34.8–46.6)
HEMOGLOBIN: 12.8 g/dL (ref 11.6–15.9)
LYMPH%: 32.7 % (ref 14.0–49.7)
MCH: 29.1 pg (ref 25.1–34.0)
MCHC: 33.1 g/dL (ref 31.5–36.0)
MCV: 88 fL (ref 79.5–101.0)
MONO#: 0.4 10*3/uL (ref 0.1–0.9)
MONO%: 9.6 % (ref 0.0–14.0)
NEUT#: 2.2 10*3/uL (ref 1.5–6.5)
NEUT%: 53 % (ref 38.4–76.8)
Platelets: 354 10*3/uL (ref 145–400)
RBC: 4.4 10*6/uL (ref 3.70–5.45)
RDW: 13.2 % (ref 11.2–14.5)
WBC: 4.2 10*3/uL (ref 3.9–10.3)
lymph#: 1.4 10*3/uL (ref 0.9–3.3)

## 2015-05-24 LAB — COMPREHENSIVE METABOLIC PANEL
ALBUMIN: 3.7 g/dL (ref 3.5–5.0)
ALT: 50 U/L (ref 0–55)
AST: 23 U/L (ref 5–34)
Alkaline Phosphatase: 77 U/L (ref 40–150)
Anion Gap: 8 mEq/L (ref 3–11)
BUN: 7.1 mg/dL (ref 7.0–26.0)
CHLORIDE: 106 meq/L (ref 98–109)
CO2: 30 mEq/L — ABNORMAL HIGH (ref 22–29)
Calcium: 8.7 mg/dL (ref 8.4–10.4)
Creatinine: 0.7 mg/dL (ref 0.6–1.1)
GLUCOSE: 95 mg/dL (ref 70–140)
POTASSIUM: 3.6 meq/L (ref 3.5–5.1)
Sodium: 144 mEq/L (ref 136–145)
Total Bilirubin: 0.5 mg/dL (ref 0.20–1.20)
Total Protein: 6.9 g/dL (ref 6.4–8.3)

## 2015-05-24 MED ORDER — LETROZOLE 2.5 MG PO TABS: 2.5000 mg | ORAL_TABLET | Freq: Every day | ORAL | Status: DC

## 2015-05-24 NOTE — Progress Notes (Signed)
[place]  [phone] [phone]     ID: BANA BORGMEYER DOB: [birth year]  MR#: [id number]  JYN#:[id number]  Patient Care Team: Clinton Quant, MD as PCP - General (Internal Medicine) Clinton Quant, MD as Referring Physician (Internal Medicine) Rolm Bookbinder, MD as Consulting Physician (General Surgery) Chauncey Cruel, MD as Consulting Physician (Oncology) Thea Silversmith, MD as Consulting Physician (Radiation Oncology) GYN: Servando Salina MD  CHIEF COMPLAINT: right breat cancer  CURRENT TREATMENT: letrozole; denosumab pending  BREAST CANCER HISTORY: From Dr. Dana Allan earlier notes:  "Elizabeth Miles is a 50 y.o. female. Without significant past medical history who on June 2013 had a mammogram that was normal. But there was on physical exam possibility of a cyst noted in the right breast. The mammogram was negative. In 2014 June patient noted on exam another lump in the right breast. She underwent a diagnostic mammogram on June 10 that showed a right breast nodule in the outer quadrant. She had an ultrasound performed that showed at the 9:30 o'clock position a 3.6 cm area and then added 10:00 position 1.3 cm area with a total area being anywhere between 5-6 cm. The patient went on to have a right breast biopsy performed in [place]. The pathology revealed an invasive ductal carcinoma. This is been confirmed by our pathology as well. The carcinoma and papillary features and was felt to be between a grade 1 and 2. The tumor was estrogen receptor positive strongly (100%) progesterone receptor negative HER-2/neu negative with a Ki-67 that showed a high proliferation rate. Patient is now seen in medical oncology for discussion of treatment options."  Bilateral breast MRI on 08/27/2012 revealed in the right upper quadrant irregular lobulated mass with a satellite nodule or lobulation within 2 mm at its superior aspect, measured together as  4.5 x 4.0 x 3.8 cm. There was extension of enhancement to the nipple, suggesting nipple involvement may be present. No lymphadenopathy was noted there was no any other area of abnormal enhancement in either breast (clinical stage IIA, T2 N0).  PET scan performed on 08/29/2012 revealed the primary breast cancer measuring 3.2 x 3.3 cm with SUV of 16. It was adjacent nodule along the superiomedial border of the primary mass measuring 1.4 cm which was also hypermetabolic. There were no additional areas of abnormal hypermetabolism. No abnormal hypermetabolic activity was seen in the chest, abdomen/pelvis,within the liver, pancreas, adrenal glands or spleen. No hypermetabolic lymph nodes. In the skeleton, no focal hypermetabolic activity to suggest skeletal metastasis was seen.  Completed neoadjuvant chemotherapy consisting of Q14 day Adriamycin/Cytoxan x 4 cycles on 10/25/2012, followed by one dose of neoadjuvant Taxol on 11/08/2012. She developed significant grade 2 neuropathy and Taxol was discontinued. Then received neoadjuvant chemotherapy consisting of single agent Abraxane given on day 1, 8, 15 of each 28 day cycle. She completed therapy on 11/15/12 - 02/14/13.   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 Repeat HER-2 testing was now positive. The patient was started on tamoxifen March 2015 and on Herceptin the same month.  Her subsequent history is as detailed below.  INTERVAL HISTORY: Elizabeth Miles returns today for follow-up of her estrogen receptor positive breast cancer, accompanied by her mother.  Since her last visit here she underwent cholecystectomy at [facility]. She tells me the surgery went well. She then developed the flu.  She was quite sick for about 2 weeks, she says. She still has a bit of a cough. She is much less achy. She still has  some back pain. This is chronic however. Her knees and feet feel better.  REVIEW OF SYSTEMS: She still continues to have dental work and although no extractions or implants are planned this is going to continue to delay our starting denosumab. She has some hot flashes, no significant night sweats. She tried to go off the venlafaxine called [place] and became extremely emotional. She is now back on it. She feels  Like I want to try but it doesn't let me chronic". It doesn't allow her to function a little bit more normally. She has had mild changes in her vision and thinks she may need new glasses. She has discomfort/pain in her right axilla dating back to the time of her surgery. Aside from work she gets on her treadmill about 3 times a week about 30 minutes at a time, she tells me. A detailed review of systems today was noncontributory  PAST MEDICAL HISTORY: Past Medical History  Diagnosis Date  . 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 GERD (gastroesophageal reflux disease)   . GERD (gastroesophageal reflux disease) 08/22/2012  . Allergy   . Complication of anesthesia     1986 ; problem waking up  . Anxiety   . Wears glasses     PAST SURGICAL HISTORY: Past Surgical History  Procedure Laterality Date  . Dilation and curettage of uterus  1993  . Cervical ablation  1983  . Breast biopsy Right 08/06/12  . Popliteal synovial cyst excision  1970  . Portacath placement Left 09/12/2012    Procedure: INSERTION PORT-A-CATH;  Surgeon: Rolm Bookbinder, MD;  Location: [place];  Service: General;  Laterality: Left;  . Breast lumpectomy with needle localization and axillary sentinel lymph node bx Right 03/10/2013    Procedure: BREAST LUMPECTOMY WITH NEEDLE LOCALIZATION AND AXILLARY SENTINEL LYMPH NODE BIOPSY;  Surgeon: Rolm Bookbinder, MD;  Location: [place];  Service: General;  Laterality: Right;  . Port-a-cath removal Left 03/10/2013    Procedure: REMOVAL PORT-A-CATH;   Surgeon: Rolm Bookbinder, MD;  Location: [place];  Service: General;  Laterality: Left;    FAMILY HISTORY Family History  Problem Relation Age of Onset  . Hypertension Mother   . Bladder Cancer Maternal Uncle 68  . Cancer Paternal Grandmother 61    lung cancer  . Lung cancer Paternal Grandfather     dx in his 39s  . COPD Paternal Uncle    the patient's parents are living and in good health. The patient has one brother, no sisters. There is no history of breast or ovarian cancer in the family to her knowledge  GYNECOLOGIC HISTORY:  Patient's last menstrual period was 03/12/2012. Menarche age 40, first live birth age 58. The patient is GX P3. 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 Her husband, Jori Moll, is a Tour manager. A daughter, Lovena Le, 97, lives in [place] and daughter Caryl Pina just had a baby girl, the patient's first grandchild. Son Jori Moll "Carlynn Spry" is 52 and at home. The patient is a Psychologist, forensic.    ADVANCED DIRECTIVES: Not in place   HEALTH MAINTENANCE: Social History  Substance Use Topics  . Smoking status: Never Smoker   . Smokeless tobacco:  Never Used  . Alcohol Use: No     Colonoscopy:  PAP:  Bone density:  Lipid panel:  Allergies  Allergen Reactions  . Darvocet [Propoxyphene N-Acetaminophen] Shortness Of Breath and Swelling  . Shellfish Allergy Shortness Of Breath and Swelling  . Other     CT dye, "chest hurt" "makes me feel cold"    Current Outpatient Prescriptions  Medication Sig Dispense Refill  . acetaminophen (TYLENOL) 500 MG tablet Take 1,000 mg by mouth every 6 (six) hours as needed.    Marland Kitchen aspirin 325 MG tablet Take 325 mg by mouth daily.    . cetirizine (ZYRTEC) 10 MG tablet Take 10 mg by mouth daily.    . cholecalciferol (VITAMIN D) 1000 UNITS tablet Take 1 tablet (1,000  Units total) by mouth daily. 90 tablet 3  . Dexlansoprazole (DEXILANT) 30 MG capsule Take 1 capsule (30 mg total) by mouth daily. 90 capsule 4  . ibuprofen (ADVIL,MOTRIN) 800 MG tablet Take 800 mg by mouth every 8 (eight) hours as needed for moderate pain (for back pain).    Marland Kitchen ketoconazole (NIZORAL) 2 % cream APPLY 1 APPLICATION TOPICALLY DAILY. 15 g 0  . venlafaxine XR (EFFEXOR-XR) 75 MG 24 hr capsule Take 1 capsule (75 mg total) by mouth daily with breakfast. 30 capsule 12   No current facility-administered medications for this visit.    OBJECTIVE: Middle-aged white womanwho appears stated age 67 Vitals:   05/24/15 1215  BP: 136/78  Pulse: 84  Temp: 98.3 F (36.8 C)  Resp: 18     Body mass index is 31.72 kg/(m^2).    ECOG FS:1 - Symptomatic but completely ambulatory  Sclerae unicteric, EOMs intact Oropharynx clear, dentition in good repair No cervical or supraclavicular adenopathy Lungs no rales or rhonchi Heart regular rate and rhythm Abd soft,Obese nontender, positive bowel sounds; laparoscopic cholecystectomy scars are healing nicely MSK no focal spinal tenderness, no upper extremity lymphedema Neuro: nonfocal, well oriented, appropriate affect Breasts: the right breast is status post lumpectomy and radiation. There is no evidence of local recurrence. The right axilla is benign. The left breast is unremarkable  LAB RESULTS:  CMP     Component Value Date/Time   NA 140 01/11/2015 1131   NA 144 08/17/2013 0346   K 3.9 01/11/2015 1131   K 3.8 08/17/2013 0346   CL 105 08/17/2013 0346   CO2 26 01/11/2015 1131   GLUCOSE 95 01/11/2015 1131   GLUCOSE 155* 08/17/2013 0346   BUN 9.7 01/11/2015 1131   BUN 8 08/17/2013 0346   CREATININE 0.8 01/11/2015 1131   CREATININE 0.80 08/17/2013 0346   CALCIUM 9.7 01/11/2015 1131   PROT 7.3 01/11/2015 1131   ALBUMIN 3.9 01/11/2015 1131   AST 16 01/11/2015 1131   ALT 17 01/11/2015 1131   ALKPHOS 90 01/11/2015 1131   BILITOT 0.37  01/11/2015 1131    I No results found for: SPEP  Lab Results  Component Value Date   WBC 4.2 05/24/2015   NEUTROABS 2.2 05/24/2015   HGB 12.8 05/24/2015   HCT 38.7 05/24/2015   MCV 88.0 05/24/2015   PLT 354 05/24/2015      Chemistry      Component Value Date/Time   NA 140 01/11/2015 1131   NA 144 08/17/2013 0346   K 3.9 01/11/2015 1131   K 3.8 08/17/2013 0346   CL 105 08/17/2013 0346   CO2 26 01/11/2015 1131   BUN 9.7 01/11/2015 1131   BUN 8 08/17/2013 0346  CREATININE 0.8 01/11/2015 1131   CREATININE 0.80 08/17/2013 0346      Component Value Date/Time   CALCIUM 9.7 01/11/2015 1131   ALKPHOS 90 01/11/2015 1131   AST 16 01/11/2015 1131   ALT 17 01/11/2015 1131   BILITOT 0.37 01/11/2015 1131       No results found for: LABCA2  No components found for: LABCA125  No results for input(s): INR in the last 168 hours.  Urinalysis    Component Value Date/Time   LABSPEC 1.010 12/13/2012 1435   PHURINE 7.0 12/13/2012 1435   GLUCOSEU Negative 12/13/2012 1435   HGBUR Trace 12/13/2012 1435   BILIRUBINUR Negative 12/13/2012 1435   KETONESUR Negative 12/13/2012 1435   PROTEINUR Negative 12/13/2012 1435   UROBILINOGEN 0.2 12/13/2012 1435   NITRITE Negative 12/13/2012 1435   LEUKOCYTESUR Small 12/13/2012 1435    STUDIES:  No results found.   ASSESSMENT: 50 y.o. [place] woman  (1) status post right breast biopsy 08/06/2012 for a clinical mT2 N0, stage IIA invasive ductal carcinoma, estrogen receptor 3+ positive, progesterone receptor and HER-2 negative, Ki67 3+ [[id number]]  (2) treated neoadjuvantly with dose dense cyclophosphamide and doxorubicin x4, completed 10/25/2012, followed by a single dose of weekly paclitaxel, poorly tolerated; followed by 11 doses of Abraxane completed 02/14/2013  (3) status post right lumpectomy and sentinel lymph node sampling 03/10/2013 for a ypT2 pN0, stage IIA invasive ductal carcinoma, grade 2, estrogen receptor 100%  positive, progesterone receptor 21% positive, with an MIB-1 of 14%, and HER-2 amplified, the signals ratio being 2.52, the number per cell 2.90  (4) adjuvant radiation completed March 2015 (?)  (5) started tamoxifen March 2015, stopped 05/25/14 because of superficial clot to left cephalic vein; began anastrozole on 05/29/14, discontinued after 2 weeks with rash; starting letrozole 05/24/2015  (6) started trastuzumab 05/23/2013, completed 05/15/14  (7) osteopenia: With T score of -1.7 on bone density scan at [facility] 06/10/2014.   (a)waiting on dental clearance to start zolendronate   PLAN Salimah is now sufficiently stable but I think we have a chance of her tolerating  Anti-estrogens. We are going to go with letrozole. We discussed the possible toxicities, side effects and complications of this agent and I assured her it does not cause blood clots or problems with rash. I did let her know that there can be some variability in cost and if they want to discharge her more than 12 or $15 a month she needs to let us know.  We discussed the fact that on venlafaxine and it is not a good idea to simply stop the medication rather it is better to go off it gradually. At this point we'll get a continue the venlafaxine for at least another year. We can then reassess.  I am not comfortable starting the denosumab until she is more completely done with all her dental work. I'm going to enter the orders so they can be restarted and if everything is fine when she returns to see me in June she will receive her first Prolia dose that day.  In the meantime she continues on vitamin D and a walking program.  She will call with any problems that may develop before her next visit     :Chauncey Cruel, MD   05/24/2015 12:29 PM

## 2015-05-24 NOTE — Telephone Encounter (Signed)
Gave and printed appt sched and avs for pt for JUNE °

## 2015-05-28 ENCOUNTER — Telehealth: Payer: Self-pay

## 2015-05-28 NOTE — Telephone Encounter (Signed)
Pharmacy calling regarding patient being allergic to letrozole.  Val, RN to call them back to discuss.

## 2015-06-07 ENCOUNTER — Telehealth: Payer: Self-pay | Admitting: Oncology

## 2015-06-07 ENCOUNTER — Telehealth: Payer: Self-pay | Admitting: *Deleted

## 2015-06-07 ENCOUNTER — Other Ambulatory Visit: Payer: Self-pay | Admitting: Oncology

## 2015-06-07 NOTE — Telephone Encounter (Signed)
Pt called to state onset of symptoms post starting new medication letrozole.  Kitt states she started the medication last week and noticed some mild symptoms after the first day.  She monitored symptoms which mildly increased by day 3 " but then on day 4 I felt fine and I was hoping I was tolerating it better "  On Friday 4/7- " I really started to feel more itchy and had tingling in y face and lips and then my tongue felt funny " " the school nurse looked at it for me and noticed it was swollen and irritated "  She developed some " chest discomfort but then do not know if that is anxiety from developing symptoms "  Pt did not have any issues with breathing.  Anaeli did not take any more letrozole post dose on Friday.  Over the weekend she took benadryl with symptoms now improving.  Per phone discussion- plan is pt will hold further letrozole at this time.  Above will be given to MD for review and further recommendation.  Return call number given as [phone].

## 2015-06-07 NOTE — Telephone Encounter (Signed)
spoke w/ pt, confirmed april appt

## 2015-06-08 ENCOUNTER — Telehealth: Payer: Self-pay | Admitting: Oncology

## 2015-06-08 ENCOUNTER — Other Ambulatory Visit: Payer: Self-pay

## 2015-06-08 NOTE — Telephone Encounter (Signed)
S.w. Pt and advised on April appt...the patient ok and aware

## 2015-06-14 ENCOUNTER — Encounter: Payer: Self-pay | Admitting: Nurse Practitioner

## 2015-06-14 ENCOUNTER — Ambulatory Visit (HOSPITAL_BASED_OUTPATIENT_CLINIC_OR_DEPARTMENT_OTHER): Payer: BLUE CROSS/BLUE SHIELD | Admitting: Nurse Practitioner

## 2015-06-14 ENCOUNTER — Telehealth: Payer: Self-pay | Admitting: Oncology

## 2015-06-14 VITALS — BP 127/79 | HR 82 | Temp 98.1°F | Resp 18 | Ht 67.0 in | Wt 206.5 lb

## 2015-06-14 DIAGNOSIS — M8580 Other specified disorders of bone density and structure, unspecified site: Secondary | ICD-10-CM | POA: Diagnosis not present

## 2015-06-14 DIAGNOSIS — T50905D Adverse effect of unspecified drugs, medicaments and biological substances, subsequent encounter: Secondary | ICD-10-CM

## 2015-06-14 DIAGNOSIS — C50411 Malignant neoplasm of upper-outer quadrant of right female breast: Secondary | ICD-10-CM | POA: Diagnosis not present

## 2015-06-14 NOTE — Telephone Encounter (Signed)
appt made and avs printed °

## 2015-06-14 NOTE — Progress Notes (Signed)
Elizabeth Miles  [phone] [phone]     ID: Elizabeth Miles DOB: [birth year]  MR#: [id number]  WCB#:[id number]  Patient Care Team: Clinton Quant, MD as PCP - General (Internal Medicine) Clinton Quant, MD as Referring Physician (Internal Medicine) Rolm Bookbinder, MD as Consulting Physician (General Surgery) Chauncey Cruel, MD as Consulting Physician (Oncology) Thea Silversmith, MD as Consulting Physician (Radiation Oncology) GYN: Servando Salina MD  CHIEF COMPLAINT: right breat cancer  CURRENT TREATMENT: letrozole; denosumab pending  BREAST CANCER HISTORY: From Dr. Dana Allan earlier notes:  "Elizabeth Miles is a 50 y.o. female. Without significant past medical history who on June 2013 had a mammogram that was normal. But there was on physical exam possibility of a cyst noted in the right breast. The mammogram was negative. In 2014 June patient noted on exam another lump in the right breast. She underwent a diagnostic mammogram on June 10 that showed a right breast nodule in the outer quadrant. She had an ultrasound performed that showed at the 9:30 o'clock position a 3.6 cm area and then added 10:00 position 1.3 cm area with a total area being anywhere between 5-6 cm. The patient went on to have a right breast biopsy performed in [place]. The pathology revealed an invasive ductal carcinoma. This is been confirmed by our pathology as well. The carcinoma and papillary features and was felt to be between a grade 1 and 2. The tumor was estrogen receptor positive strongly (100%) progesterone receptor negative HER-2/neu negative with a Ki-67 that showed a high proliferation rate. Patient is now seen in medical oncology for discussion of treatment options."  Bilateral breast MRI on 08/27/2012 revealed in the right upper quadrant irregular lobulated mass with a satellite nodule or lobulation within 2 mm at its superior aspect, measured together as  4.5 x 4.0 x 3.8 cm. There was extension of enhancement to the nipple, suggesting nipple involvement may be present. No lymphadenopathy was noted there was no any other area of abnormal enhancement in either breast (clinical stage IIA, T2 N0).  PET scan performed on 08/29/2012 revealed the primary breast cancer measuring 3.2 x 3.3 cm with SUV of 16. It was adjacent nodule along the superiomedial border of the primary mass measuring 1.4 cm which was also hypermetabolic. There were no additional areas of abnormal hypermetabolism. No abnormal hypermetabolic activity was seen in the chest, abdomen/pelvis,within the liver, pancreas, adrenal glands or spleen. No hypermetabolic lymph nodes. In the skeleton, no focal hypermetabolic activity to suggest skeletal metastasis was seen.  Completed neoadjuvant chemotherapy consisting of Q14 day Adriamycin/Cytoxan x 4 cycles on 10/25/2012, followed by one dose of neoadjuvant Taxol on 11/08/2012. She developed significant grade 2 neuropathy and Taxol was discontinued. Then received neoadjuvant chemotherapy consisting of single agent Abraxane given on day 1, 8, 15 of each 28 day cycle. She completed therapy on 11/15/12 - 02/14/13.   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 Repeat HER-2 testing was now positive. The patient was started on tamoxifen March 2015 and on Herceptin the same month.  Her subsequent history is as detailed below.  INTERVAL HISTORY: Elizabeth Miles returns today for follow-up of her estrogen receptor positive breast cancer, accompanied by her mother. She was started on letrozole at her last visit because of intolerance to anastrozole earlier this year. She was only on the drug for  5 days before developing mild angioedema symptoms such as swelling to her tongue, gums, and lips. She also complained of left  facial and tongue tingling, and dizziness. She took benadryl and the swelling resolved. She still has a very faint tingling to her tongue.   REVIEW OF SYSTEMS: Elizabeth Miles denies fevers ,chills, nausea, vomiting, or changes in bowel or bladder habits. She is mildly fatigued. She is quitting her job to care for her parents. Her father was just diagnosed with prostate cancer, and her mom is battling ovarian cancer currently. She has shooting pains to her left axilla, otherwise she denies pain. She does walk on the treadmill for 30 minutes, three times weekly. She has some hot flashes and this is managed with venlafaxine. Her mood is stable. A detailed review of systems is otherwise stable.  PAST MEDICAL HISTORY: Past Medical History  Diagnosis Date  . 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 GERD (gastroesophageal reflux disease)   . GERD (gastroesophageal reflux disease) 08/22/2012  . Allergy   . Complication of anesthesia     1986 ; problem waking up  . Anxiety   . Wears glasses     PAST SURGICAL HISTORY: Past Surgical History  Procedure Laterality Date  . Dilation and curettage of uterus  1993  . Cervical ablation  1983  . Breast biopsy Right 08/06/12  . Popliteal synovial cyst excision  1970  . Portacath placement Left 09/12/2012    Procedure: INSERTION PORT-A-CATH;  Surgeon: Rolm Bookbinder, MD;  Location: [facility];  Service: General;  Laterality: Left;  . Breast lumpectomy with needle localization and axillary sentinel lymph node bx Right 03/10/2013    Procedure: BREAST LUMPECTOMY WITH NEEDLE LOCALIZATION AND AXILLARY SENTINEL LYMPH NODE BIOPSY;  Surgeon: Rolm Bookbinder, MD;  Location: [place];  Service: General;  Laterality: Right;  . Port-a-cath removal Left 03/10/2013    Procedure: REMOVAL PORT-A-CATH;  Surgeon: Rolm Bookbinder, MD;  Location: [facility];  Service: General;  Laterality: Left;    FAMILY HISTORY Family History  Problem  Relation Age of Onset  . Hypertension Mother   . Bladder Cancer Maternal Uncle 68  . Cancer Paternal Grandmother 63    lung cancer  . Lung cancer Paternal Grandfather     dx in his 51s  . COPD Paternal Uncle    the patient's parents are living and in good health. The patient has one brother, no sisters. There is no history of breast or ovarian cancer in the family to her knowledge  GYNECOLOGIC HISTORY:  Patient's last menstrual period was 03/12/2012. Menarche age 28, first live birth age 79. The patient is GX P3. 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 Her husband, Elizabeth Miles, is a Tour manager. A daughter, Elizabeth Miles, 53, lives in [place] and daughter Elizabeth Miles just had a baby girl, the patient's first grandchild. Son Elizabeth Miles "Carlynn Spry" is 13 and at home. The patient is a Psychologist, forensic.    ADVANCED DIRECTIVES: Not in place   HEALTH MAINTENANCE: Social History  Substance Use Topics  . Smoking status: Never Smoker   . Smokeless tobacco: Never Used  . Alcohol Use: No     Colonoscopy:  PAP:  Bone density:  Lipid panel:  Allergies  Allergen Reactions  . Darvocet [Propoxyphene N-Acetaminophen] Shortness Of Breath and Swelling  . Shellfish Allergy Shortness Of Breath  and Swelling  . Other     CT dye, "chest hurt" "makes me feel cold"    Current Outpatient Prescriptions  Medication Sig Dispense Refill  . aspirin 325 MG tablet Take 325 mg by mouth daily.    . cetirizine (ZYRTEC) 10 MG tablet Take 10 mg by mouth daily.    . cholecalciferol (VITAMIN D) 1000 UNITS tablet Take 1 tablet (1,000 Units total) by mouth daily. 90 tablet 3  . Dexlansoprazole (DEXILANT) 30 MG capsule Take 1 capsule (30 mg total) by mouth daily. 90 capsule 4  . venlafaxine XR (EFFEXOR-XR) 75 MG 24 hr capsule Take 1 capsule (75 mg total) by mouth daily with breakfast.  30 capsule 12  . acetaminophen (TYLENOL) 500 MG tablet Take 1,000 mg by mouth every 6 (six) hours as needed. Reported on 06/14/2015    . ibuprofen (ADVIL,MOTRIN) 800 MG tablet Take 800 mg by mouth every 8 (eight) hours as needed for moderate pain (for back pain). Reported on 06/14/2015    . letrozole (FEMARA) 2.5 MG tablet Take 1 tablet (2.5 mg total) by mouth daily. (Patient not taking: Reported on 06/14/2015) 30 tablet 3   No current facility-administered medications for this visit.    OBJECTIVE: Middle-aged white womanwho appears stated age 50 Vitals:   06/14/15 0855  BP: 127/79  Pulse: 82  Temp: 98.1 F (36.7 C)  Resp: 18     Body mass index is 32.33 kg/(m^2).    ECOG FS:1 - Symptomatic but completely ambulatory  Skin: warm, dry  HEENT: sclerae anicteric, conjunctivae pink, oropharynx clear. No thrush or mucositis.  Lymph Nodes: No cervical or supraclavicular lymphadenopathy  Lungs: clear to auscultation bilaterally, no rales, wheezes, or rhonci  Heart: regular rate and rhythm  Abdomen: round, soft, non tender, positive bowel sounds  Musculoskeletal: No focal spinal tenderness, no peripheral edema  Neuro: non focal, well oriented, positive affect  Breasts: deferred  LAB RESULTS:  CMP     Component Value Date/Time   NA 144 05/24/2015 1202   NA 144 08/17/2013 0346   K 3.6 05/24/2015 1202   K 3.8 08/17/2013 0346   CL 105 08/17/2013 0346   CO2 30* 05/24/2015 1202   GLUCOSE 95 05/24/2015 1202   GLUCOSE 155* 08/17/2013 0346   BUN 7.1 05/24/2015 1202   BUN 8 08/17/2013 0346   CREATININE 0.7 05/24/2015 1202   CREATININE 0.80 08/17/2013 0346   CALCIUM 8.7 05/24/2015 1202   PROT 6.9 05/24/2015 1202   ALBUMIN 3.7 05/24/2015 1202   AST 23 05/24/2015 1202   ALT 50 05/24/2015 1202   ALKPHOS 77 05/24/2015 1202   BILITOT 0.50 05/24/2015 1202    I No results found for: SPEP  Lab Results  Component Value Date   WBC 4.2 05/24/2015   NEUTROABS 2.2 05/24/2015   HGB 12.8  05/24/2015   HCT 38.7 05/24/2015   MCV 88.0 05/24/2015   PLT 354 05/24/2015      Chemistry      Component Value Date/Time   NA 144 05/24/2015 1202   NA 144 08/17/2013 0346   K 3.6 05/24/2015 1202   K 3.8 08/17/2013 0346   CL 105 08/17/2013 0346   CO2 30* 05/24/2015 1202   BUN 7.1 05/24/2015 1202   BUN 8 08/17/2013 0346   CREATININE 0.7 05/24/2015 1202   CREATININE 0.80 08/17/2013 0346      Component Value Date/Time   CALCIUM 8.7 05/24/2015 1202   ALKPHOS 77 05/24/2015 1202   AST 23 05/24/2015  1202   ALT 50 05/24/2015 1202   BILITOT 0.50 05/24/2015 1202       No results found for: LABCA2  No components found for: LABCA125  No results for input(s): INR in the last 168 hours.  Urinalysis    Component Value Date/Time   LABSPEC 1.010 12/13/2012 1435   PHURINE 7.0 12/13/2012 1435   GLUCOSEU Negative 12/13/2012 1435   HGBUR Trace 12/13/2012 1435   BILIRUBINUR Negative 12/13/2012 1435   KETONESUR Negative 12/13/2012 1435   PROTEINUR Negative 12/13/2012 1435   UROBILINOGEN 0.2 12/13/2012 1435   NITRITE Negative 12/13/2012 1435   LEUKOCYTESUR Small 12/13/2012 1435    STUDIES:  No results found.   ASSESSMENT: 50 y.o. [place] woman  (1) status post right breast biopsy 08/06/2012 for a clinical mT2 N0, stage IIA invasive ductal carcinoma, estrogen receptor 3+ positive, progesterone receptor and HER-2 negative, Ki67 3+ [[id number]]  (2) treated neoadjuvantly with dose dense cyclophosphamide and doxorubicin x4, completed 10/25/2012, followed by a single dose of weekly paclitaxel, poorly tolerated; followed by 11 doses of Abraxane completed 02/14/2013  (3) status post right lumpectomy and sentinel lymph node sampling 03/10/2013 for a ypT2 pN0, stage IIA invasive ductal carcinoma, grade 2, estrogen receptor 100% positive, progesterone receptor 21% positive, with an MIB-1 of 14%, and HER-2 amplified, the signals ratio being 2.52, the number per cell 2.90  (4)  adjuvant radiation completed March 2015 (?)  (5) started tamoxifen March 2015, stopped 05/25/14 because of superficial clot to left cephalic vein; began anastrozole on 05/29/14, discontinued after 2 weeks with rash; started letrozole 05/24/2015, discontinued after 5 days use because of angioedema-like symptoms  (6) started trastuzumab 05/23/2013, completed 05/15/14  (7) osteopenia: With T score of -1.7 on bone density scan at [facility] 06/10/2014.   (a)waiting on dental clearance to start zolendronate   PLAN: I consulted with Dr. Jana Hakim regarding Cythia's previous experiences on antiestrogen therapy. We are left with 2 options, exemestane or fulvestrant. Exemestane might be too expensive and runs the risk of similar side effects of letrozole or anastrozole. Dr. Jana Hakim would like to see if her insurance company will approve of fulvestrant first. Generally, this is used in the metastatic setting, but she stands a better chance of tolerating this well.   I have placed the orders for her first injection to be administered next Wednesday, if the approval is granted.   Jaleigh will return on 5/4 for follow up with Dr. Jana Hakim. During this time they will discuss her tolerance to fulvestrant. Dr. Jana Hakim will order the "booster dose" 2 weeks later if necessary. She understands and agrees with this plan. She knows the goal of treatment in her case is cure. She has been encouraged to call with any issues that might arise before her next visit here.   Laurie Panda, NP   06/14/2015 10:25 AM

## 2015-06-17 ENCOUNTER — Ambulatory Visit: Payer: BLUE CROSS/BLUE SHIELD | Admitting: Nurse Practitioner

## 2015-06-23 ENCOUNTER — Other Ambulatory Visit: Payer: Self-pay | Admitting: Nurse Practitioner

## 2015-06-23 ENCOUNTER — Ambulatory Visit (HOSPITAL_BASED_OUTPATIENT_CLINIC_OR_DEPARTMENT_OTHER): Payer: BLUE CROSS/BLUE SHIELD

## 2015-06-23 VITALS — BP 138/83 | HR 90 | Temp 98.3°F

## 2015-06-23 DIAGNOSIS — C50411 Malignant neoplasm of upper-outer quadrant of right female breast: Secondary | ICD-10-CM

## 2015-06-23 DIAGNOSIS — M8580 Other specified disorders of bone density and structure, unspecified site: Secondary | ICD-10-CM

## 2015-06-23 DIAGNOSIS — Z5111 Encounter for antineoplastic chemotherapy: Secondary | ICD-10-CM | POA: Diagnosis not present

## 2015-06-23 MED ORDER — FULVESTRANT 250 MG/5ML IM SOLN
500.0000 mg | INTRAMUSCULAR | Status: DC
  Administered 2015-06-23: 500 mg via INTRAMUSCULAR
  Filled 2015-06-23: qty 10

## 2015-06-23 NOTE — Patient Instructions (Signed)

## 2015-07-01 ENCOUNTER — Ambulatory Visit (HOSPITAL_BASED_OUTPATIENT_CLINIC_OR_DEPARTMENT_OTHER): Payer: BLUE CROSS/BLUE SHIELD | Admitting: Oncology

## 2015-07-01 ENCOUNTER — Telehealth: Payer: Self-pay | Admitting: Oncology

## 2015-07-01 VITALS — BP 122/81 | HR 92 | Temp 98.0°F | Resp 18 | Ht 67.0 in | Wt 211.0 lb

## 2015-07-01 DIAGNOSIS — C50411 Malignant neoplasm of upper-outer quadrant of right female breast: Secondary | ICD-10-CM

## 2015-07-01 DIAGNOSIS — E049 Nontoxic goiter, unspecified: Secondary | ICD-10-CM

## 2015-07-01 DIAGNOSIS — L719 Rosacea, unspecified: Secondary | ICD-10-CM | POA: Diagnosis not present

## 2015-07-01 DIAGNOSIS — M8580 Other specified disorders of bone density and structure, unspecified site: Secondary | ICD-10-CM

## 2015-07-01 DIAGNOSIS — M859 Disorder of bone density and structure, unspecified: Secondary | ICD-10-CM

## 2015-07-01 MED ORDER — METRONIDAZOLE 1 % EX GEL: Freq: Every day | CUTANEOUS | Status: DC

## 2015-07-01 NOTE — Progress Notes (Signed)
[name]  [phone] [phone]) [phone]     ID: ARTHELLA HEADINGS DOB: 1965-05-16  MR#: [id number]  NOM#:[id number]  Patient Care Team: Clinton Quant, MD as PCP - General (Internal Medicine) Clinton Quant, MD as Referring Physician (Internal Medicine) Rolm Bookbinder, MD as Consulting Physician (General Surgery) Chauncey Cruel, MD as Consulting Physician (Oncology) Thea Silversmith, MD as Consulting Physician (Radiation Oncology) GYN: Servando Salina MD  CHIEF COMPLAINT: right breat cancer  CURRENT TREATMENT: letrozole; denosumab pending  BREAST CANCER HISTORY: From Dr. Dana Allan earlier notes:  "Elizabeth Miles is a 50 y.o. female. Without significant past medical history who on June 2013 had a mammogram that was normal. But there was on physical exam possibility of a cyst noted in the right breast. The mammogram was negative. In 2014 June patient noted on exam another lump in the right breast. She underwent a diagnostic mammogram on June 10 that showed a right breast nodule in the outer quadrant. She had an ultrasound performed that showed at the 9:30 o'clock position a 3.6 cm area and then added 10:00 position 1.3 cm area with a total area being anywhere between 5-6 cm. The patient went on to have a right breast biopsy performed in [place]. The pathology revealed an invasive ductal carcinoma. This is been confirmed by our pathology as well. The carcinoma and papillary features and was felt to be between a grade 1 and 2. The tumor was estrogen receptor positive strongly (100%) progesterone receptor negative HER-2/neu negative with a Ki-67 that showed a high proliferation rate. Patient is now seen in medical oncology for discussion of treatment options."  Bilateral breast MRI on 08/27/2012 revealed in the right upper quadrant irregular lobulated mass with a satellite nodule or lobulation within 2 mm at its superior aspect, measured together as  4.5 x 4.0 x 3.8 cm. There was extension of enhancement to the nipple, suggesting nipple involvement may be present. No lymphadenopathy was noted there was no any other area of abnormal enhancement in either breast (clinical stage IIA, T2 N0).  PET scan performed on 08/29/2012 revealed the primary breast cancer measuring 3.2 x 3.3 cm with SUV of 16. It was adjacent nodule along the superiomedial border of the primary mass measuring 1.4 cm which was also hypermetabolic. There were no additional areas of abnormal hypermetabolism. No abnormal hypermetabolic activity was seen in the chest, abdomen/pelvis,within the liver, pancreas, adrenal glands or spleen. No hypermetabolic lymph nodes. In the skeleton, no focal hypermetabolic activity to suggest skeletal metastasis was seen.  Completed neoadjuvant chemotherapy consisting of Q14 day Adriamycin/Cytoxan x 4 cycles on 10/25/2012, followed by one dose of neoadjuvant Taxol on 11/08/2012. She developed significant grade 2 neuropathy and Taxol was discontinued. Then received neoadjuvant chemotherapy consisting of single agent Abraxane given on day 1, 8, 15 of each 28 day cycle. She completed therapy on 11/15/12 - 02/14/13.   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 Repeat HER-2 testing was now positive. The patient was started on tamoxifen March 2015 and on Herceptin the same month.  Her subsequent history is as detailed below.  INTERVAL HISTORY: [name] returns today for follow-up of her breast cancer, accompanied by her mother. She received her first dose of fulvestrant a week ago. She did quite well with that. She had no pain with the injection itself other than a  minimal stinging. She then went out to eat and unfortunately had 2 episodes of diarrhea that evening, likely related more to the food into the shot.  She has had no significant hot flashes or othersystemic side effects related to the treatment  REVIEW OF SYSTEMS: Lalonnie Is worried about her neck. 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 Chrisie has a little bit of a rash on her cheeks that comes and goes that worries her as well. There is tenderness in the right breast. Aside from these issues a detailed review of systems today was noncontributory  PAST MEDICAL HISTORY: Past Medical History  Diagnosis Date  . Breast cancer ([facility]) 08/06/12    invasive ductal carcioma  . GERD (gastroesophageal reflux disease)   . GERD (gastroesophageal reflux disease) 08/22/2012  . Allergy   . Complication of anesthesia     1986 ; problem waking up  . Anxiety   . Wears glasses     PAST SURGICAL HISTORY: Past Surgical History  Procedure Laterality Date  . Dilation and curettage of uterus  1993  . Cervical ablation  1983  . Breast biopsy Right 08/06/12  . Popliteal synovial cyst excision  1970  . Portacath placement Left 09/12/2012    Procedure: INSERTION PORT-A-CATH;  Surgeon: Rolm Bookbinder, MD;  Location: [facility];  Service: General;  Laterality: Left;  . Breast lumpectomy with needle localization and axillary sentinel lymph node bx Right 03/10/2013    Procedure: BREAST LUMPECTOMY WITH NEEDLE LOCALIZATION AND AXILLARY SENTINEL LYMPH NODE BIOPSY;  Surgeon: Rolm Bookbinder, MD;  Location: [facility];  Service: General;  Laterality: Right;  . Port-a-cath removal Left 03/10/2013    Procedure: REMOVAL PORT-A-CATH;  Surgeon: Rolm Bookbinder, MD;  Location: [facility];  Service: General;  Laterality: Left;    FAMILY HISTORY Family History  Problem Relation Age of Onset  . Hypertension Mother   . Bladder Cancer Maternal Uncle 68  . Cancer Paternal Grandmother 67    lung cancer  . Lung cancer Paternal Grandfather     dx in his 33s  . COPD Paternal Uncle     the patient's parents are living and in good health. The patient has one brother, no sisters. There is no history of breast or ovarian cancer in the family to her knowledge  GYNECOLOGIC HISTORY:  Patient's last menstrual period was 03/12/2012. Menarche age 28, first live birth age 80. The patient is GX P3. 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 Her husband, Jori Moll, is a Tour manager. A daughter, Lovena Le, 25, lives in [place] and daughter Caryl Pina just had a baby girl, the patient's first grandchild. Son Jori Moll "Carlynn Spry" is 29 and at home. The patient is a Psychologist, forensic.    ADVANCED DIRECTIVES: Not in place   HEALTH MAINTENANCE: Social History  Substance Use Topics  . Smoking status: Never Smoker   . Smokeless tobacco: Never Used  . Alcohol Use: No     Colonoscopy:  PAP:  Bone density:  Lipid panel:  Allergies  Allergen Reactions  . Darvocet [Propoxyphene N-Acetaminophen] Shortness Of Breath and Swelling  . Shellfish Allergy Shortness Of Breath and Swelling  . Other     CT dye, "chest hurt" "makes me feel cold"    Current Outpatient Prescriptions  Medication Sig Dispense Refill  .  acetaminophen (TYLENOL) 500 MG tablet Take 1,000 mg by mouth every 6 (six) hours as needed. Reported on 06/14/2015    . aspirin 325 MG tablet Take 325 mg by mouth daily.    . cetirizine (ZYRTEC) 10 MG tablet Take 10 mg by mouth daily.    . cholecalciferol (VITAMIN D) 1000 UNITS tablet Take 1 tablet (1,000 Units total) by mouth daily. 90 tablet 3  . Dexlansoprazole (DEXILANT) 30 MG capsule Take 1 capsule (30 mg total) by mouth daily. 90 capsule 4  . ibuprofen (ADVIL,MOTRIN) 800 MG tablet Take 800 mg by mouth every 8 (eight) hours as needed for moderate pain (for back pain). Reported on 06/14/2015    . letrozole (FEMARA) 2.5 MG tablet Take 1 tablet (2.5  mg total) by mouth daily. (Patient not taking: Reported on 06/14/2015) 30 tablet 3  . venlafaxine XR (EFFEXOR-XR) 75 MG 24 hr capsule Take 1 capsule (75 mg total) by mouth daily with breakfast. 30 capsule 12   No current facility-administered medications for this visit.    OBJECTIVE: Middle-aged white woman In no acute distress  Filed Vitals:   07/01/15 1351  BP: 122/81  Pulse: 92  Temp: 98 F (36.7 C)  Resp: 18     Body mass index is 33.04 kg/(m^2).    ECOG FS:1 - Symptomatic but completely ambulatory  Sclerae unicteric, pupils round and equal Oropharynx clear and moist-- Mild thyromegaly without palpable nodule No cervical or supraclavicular adenopathy Lungs no rales or rhonchi Heart regular rate and rhythm Abd soft, nontender, positive bowel sounds MSK no focal spinal tenderness, no upper extremity lymphedema Neuro: nonfocal, well oriented, appropriate affect Breasts: the right breast is status post lumpectomy and radiation. There is some tenderness to deep palpation. The right axilla is benign. The left breast is unremarkable    LAB RESULTS:  CMP     Component Value Date/Time   NA 144 05/24/2015 1202   NA 144 08/17/2013 0346   K 3.6 05/24/2015 1202   K 3.8 08/17/2013 0346   CL 105 08/17/2013 0346   CO2 30* 05/24/2015 1202   GLUCOSE 95 05/24/2015 1202   GLUCOSE 155* 08/17/2013 0346   BUN 7.1 05/24/2015 1202   BUN 8 08/17/2013 0346   CREATININE 0.7 05/24/2015 1202   CREATININE 0.80 08/17/2013 0346   CALCIUM 8.7 05/24/2015 1202   PROT 6.9 05/24/2015 1202   ALBUMIN 3.7 05/24/2015 1202   AST 23 05/24/2015 1202   ALT 50 05/24/2015 1202   ALKPHOS 77 05/24/2015 1202   BILITOT 0.50 05/24/2015 1202    I No results found for: SPEP  Lab Results  Component Value Date   WBC 4.2 05/24/2015   NEUTROABS 2.2 05/24/2015   HGB 12.8 05/24/2015   HCT 38.7 05/24/2015   MCV 88.0 05/24/2015   PLT 354 05/24/2015      Chemistry      Component Value Date/Time   NA 144  05/24/2015 1202   NA 144 08/17/2013 0346   K 3.6 05/24/2015 1202   K 3.8 08/17/2013 0346   CL 105 08/17/2013 0346   CO2 30* 05/24/2015 1202   BUN 7.1 05/24/2015 1202   BUN 8 08/17/2013 0346   CREATININE 0.7 05/24/2015 1202   CREATININE 0.80 08/17/2013 0346      Component Value Date/Time   CALCIUM 8.7 05/24/2015 1202   ALKPHOS 77 05/24/2015 1202   AST 23 05/24/2015 1202   ALT 50 05/24/2015 1202   BILITOT 0.50 05/24/2015 1202  No results found for: LABCA2  No components found for: [id number]  No results for input(s): INR in the last 168 hours.  Urinalysis    Component Value Date/Time   LABSPEC 1.010 12/13/2012 1435   PHURINE 7.0 12/13/2012 1435   GLUCOSEU Negative 12/13/2012 1435   HGBUR Trace 12/13/2012 1435   BILIRUBINUR Negative 12/13/2012 1435   KETONESUR Negative 12/13/2012 1435   PROTEINUR Negative 12/13/2012 1435   UROBILINOGEN 0.2 12/13/2012 1435   NITRITE Negative 12/13/2012 1435   LEUKOCYTESUR Small 12/13/2012 1435    STUDIES:  No results found.   ASSESSMENT: 50 y.o. [place] woman  (1) status post right breast biopsy 08/06/2012 for a clinical mT2 N0, stage IIA invasive ductal carcinoma, estrogen receptor 3+ positive, progesterone receptor and HER-2 negative, Ki67 3+ [[id number]]  (2) treated neoadjuvantly with dose dense cyclophosphamide and doxorubicin x4, completed 10/25/2012, followed by a single dose of weekly paclitaxel, poorly tolerated; followed by 11 doses of Abraxane completed 02/14/2013  (3) status post right lumpectomy and sentinel lymph node sampling 03/10/2013 for a ypT2 pN0, stage IIA invasive ductal carcinoma, grade 2, estrogen receptor 100% positive, progesterone receptor 21% positive, with an MIB-1 of 14%, and HER-2 amplified, the signals ratio being 2.52, the number per cell 2.90  (4) adjuvant radiation completed March 2015 (?)  (5) started tamoxifen March 2015, stopped 05/25/14 because of superficial clot to left  cephalic vein; began anastrozole on 05/29/14, discontinued after 2 weeks with rash; started letrozole 05/24/2015, discontinued after 5 days use because of angioedema-like symptoms  (6) started trastuzumab 05/23/2013, completed 05/15/14  (7) osteopenia: With T score of -1.7 on bone density scan at [facility] 06/10/2014.   (a)waiting on dental clearance to start zolendronate  (8) fulvestrant started 06/23/2015  PLAN: Ronald Pippins tolerated her first fulvestrant dose with no significant side effects and minimal local tenderness. We discussed whether she wanted to have the "boost her" shot next week or simply wait for her "monthly" shots 3 weeks from now. She would like to "build up", so she will return on May 11 for her booster and then May 25 for her monthly shot. Thereafter the shots will be every 28 days.  She has a little bit of rosacea. I went ahead and wrote for [employer] and explained how to use it. She also has mild goiter. I'm going to obtain an ultrasound of the neck on June 22, the day she returns to see me.  Otherwise the overall plan is to continue fulvestrant if she can tolerate it for a minimum of 5 years  She knows to call for any problems that may develop before the next visit. [place]  Chauncey Cruel, MD   07/01/2015 1:58 PM

## 2015-07-01 NOTE — Telephone Encounter (Signed)
per pof to sch pt appt-gaqve pt copy of avs °

## 2015-07-08 ENCOUNTER — Ambulatory Visit (HOSPITAL_BASED_OUTPATIENT_CLINIC_OR_DEPARTMENT_OTHER): Payer: BLUE CROSS/BLUE SHIELD

## 2015-07-08 ENCOUNTER — Other Ambulatory Visit (HOSPITAL_BASED_OUTPATIENT_CLINIC_OR_DEPARTMENT_OTHER): Payer: BLUE CROSS/BLUE SHIELD

## 2015-07-08 VITALS — BP 133/75 | HR 83 | Temp 98.3°F

## 2015-07-08 DIAGNOSIS — C50411 Malignant neoplasm of upper-outer quadrant of right female breast: Secondary | ICD-10-CM

## 2015-07-08 DIAGNOSIS — Z5111 Encounter for antineoplastic chemotherapy: Secondary | ICD-10-CM | POA: Diagnosis not present

## 2015-07-08 DIAGNOSIS — M8580 Other specified disorders of bone density and structure, unspecified site: Secondary | ICD-10-CM

## 2015-07-08 LAB — COMPREHENSIVE METABOLIC PANEL
ALBUMIN: 3.9 g/dL (ref 3.5–5.0)
ALK PHOS: 86 U/L (ref 40–150)
ALT: 21 U/L (ref 0–55)
AST: 17 U/L (ref 5–34)
Anion Gap: 8 mEq/L (ref 3–11)
BUN: 11.4 mg/dL (ref 7.0–26.0)
CALCIUM: 9.8 mg/dL (ref 8.4–10.4)
CO2: 29 mEq/L (ref 22–29)
Chloride: 103 mEq/L (ref 98–109)
Creatinine: 0.8 mg/dL (ref 0.6–1.1)
EGFR: 84 mL/min/{1.73_m2} — ABNORMAL LOW (ref >90)
Glucose: 84 mg/dl (ref 70–140)
POTASSIUM: 4.1 meq/L (ref 3.5–5.1)
SODIUM: 140 meq/L (ref 136–145)
TOTAL PROTEIN: 7.7 g/dL (ref 6.4–8.3)
Total Bilirubin: 0.62 mg/dL (ref 0.20–1.20)

## 2015-07-08 LAB — CBC WITH DIFFERENTIAL/PLATELET
BASO%: 1.3 % (ref 0.0–2.0)
BASOS ABS: 0.1 10*3/uL (ref 0.0–0.1)
EOS%: 7.2 % — ABNORMAL HIGH (ref 0.0–7.0)
Eosinophils Absolute: 0.4 10*3/uL (ref 0.0–0.5)
HEMATOCRIT: 42.5 % (ref 34.8–46.6)
HEMOGLOBIN: 14.1 g/dL (ref 11.6–15.9)
LYMPH#: 1.6 10*3/uL (ref 0.9–3.3)
LYMPH%: 28.4 % (ref 14.0–49.7)
MCH: 29.5 pg (ref 25.1–34.0)
MCHC: 33.1 g/dL (ref 31.5–36.0)
MCV: 89.2 fL (ref 79.5–101.0)
MONO#: 0.5 10*3/uL (ref 0.1–0.9)
MONO%: 8.7 % (ref 0.0–14.0)
NEUT%: 54.4 % (ref 38.4–76.8)
NEUTROS ABS: 3 10*3/uL (ref 1.5–6.5)
Platelets: 301 10*3/uL (ref 145–400)
RBC: 4.77 10*6/uL (ref 3.70–5.45)
RDW: 13.9 % (ref 11.2–14.5)
WBC: 5.5 10*3/uL (ref 3.9–10.3)

## 2015-07-08 MED ORDER — FULVESTRANT 250 MG/5ML IM SOLN
500.0000 mg | INTRAMUSCULAR | Status: DC
  Administered 2015-07-08: 500 mg via INTRAMUSCULAR
  Filled 2015-07-08: qty 10

## 2015-07-22 ENCOUNTER — Ambulatory Visit (HOSPITAL_BASED_OUTPATIENT_CLINIC_OR_DEPARTMENT_OTHER): Payer: BLUE CROSS/BLUE SHIELD

## 2015-07-22 ENCOUNTER — Other Ambulatory Visit (HOSPITAL_BASED_OUTPATIENT_CLINIC_OR_DEPARTMENT_OTHER): Payer: BLUE CROSS/BLUE SHIELD

## 2015-07-22 VITALS — BP 129/82 | HR 91 | Temp 98.3°F | Resp 16

## 2015-07-22 DIAGNOSIS — M542 Cervicalgia: Secondary | ICD-10-CM | POA: Diagnosis not present

## 2015-07-22 DIAGNOSIS — Z5111 Encounter for antineoplastic chemotherapy: Secondary | ICD-10-CM

## 2015-07-22 DIAGNOSIS — C50411 Malignant neoplasm of upper-outer quadrant of right female breast: Secondary | ICD-10-CM

## 2015-07-22 DIAGNOSIS — M8580 Other specified disorders of bone density and structure, unspecified site: Secondary | ICD-10-CM

## 2015-07-22 LAB — CBC WITH DIFFERENTIAL/PLATELET
BASO%: 0.5 % (ref 0.0–2.0)
Basophils Absolute: 0 10*3/uL (ref 0.0–0.1)
EOS%: 5.1 % (ref 0.0–7.0)
Eosinophils Absolute: 0.3 10*3/uL (ref 0.0–0.5)
HEMATOCRIT: 41.7 % (ref 34.8–46.6)
HEMOGLOBIN: 13.8 g/dL (ref 11.6–15.9)
LYMPH#: 1.6 10*3/uL (ref 0.9–3.3)
LYMPH%: 27.1 % (ref 14.0–49.7)
MCH: 29.7 pg (ref 25.1–34.0)
MCHC: 33.1 g/dL (ref 31.5–36.0)
MCV: 89.7 fL (ref 79.5–101.0)
MONO#: 0.5 10*3/uL (ref 0.1–0.9)
MONO%: 7.7 % (ref 0.0–14.0)
NEUT%: 59.6 % (ref 38.4–76.8)
NEUTROS ABS: 3.5 10*3/uL (ref 1.5–6.5)
PLATELETS: 375 10*3/uL (ref 145–400)
RBC: 4.65 10*6/uL (ref 3.70–5.45)
RDW: 13.2 % (ref 11.2–14.5)
WBC: 5.8 10*3/uL (ref 3.9–10.3)

## 2015-07-22 LAB — TSH: TSH: 1.041 m(IU)/L (ref 0.308–3.960)

## 2015-07-22 MED ORDER — FULVESTRANT 250 MG/5ML IM SOLN
500.0000 mg | INTRAMUSCULAR | Status: DC
  Administered 2015-07-22: 500 mg via INTRAMUSCULAR
  Filled 2015-07-22: qty 10

## 2015-07-22 NOTE — Progress Notes (Signed)
Patient came over to the breast center requesting to speak with Probation officer.  Patient was bit by a tick three days ago and ended up with a round bruise on the bite.  Patient went to urgent care and was placed on an antibiotic.  She was questioning whether she should get her injection today.  Per MD patient is ok to receive her injection today.

## 2015-07-22 NOTE — Patient Instructions (Signed)

## 2015-07-23 LAB — T3 UPTAKE: T3 Uptake Ratio: 24 % (ref 24–39)

## 2015-07-23 LAB — T4, FREE: T4,Free(Direct): 1.18 ng/dL (ref 0.82–1.77)

## 2015-08-13 ENCOUNTER — Ambulatory Visit (HOSPITAL_COMMUNITY)
Admission: RE | Admit: 2015-08-13 | Discharge: 2015-08-13 | Disposition: A | Discharge status: Home / Self Care | Payer: BLUE CROSS/BLUE SHIELD | Source: Ambulatory Visit | Attending: Oncology | Admitting: Oncology

## 2015-08-13 DIAGNOSIS — C50411 Malignant neoplasm of upper-outer quadrant of right female breast: Secondary | ICD-10-CM | POA: Insufficient documentation

## 2015-08-13 DIAGNOSIS — M8580 Other specified disorders of bone density and structure, unspecified site: Secondary | ICD-10-CM | POA: Diagnosis not present

## 2015-08-13 DIAGNOSIS — E049 Nontoxic goiter, unspecified: Secondary | ICD-10-CM | POA: Diagnosis present

## 2015-08-16 ENCOUNTER — Other Ambulatory Visit (HOSPITAL_BASED_OUTPATIENT_CLINIC_OR_DEPARTMENT_OTHER): Payer: BLUE CROSS/BLUE SHIELD

## 2015-08-16 ENCOUNTER — Ambulatory Visit (HOSPITAL_BASED_OUTPATIENT_CLINIC_OR_DEPARTMENT_OTHER): Payer: BLUE CROSS/BLUE SHIELD | Admitting: Oncology

## 2015-08-16 ENCOUNTER — Ambulatory Visit: Payer: BLUE CROSS/BLUE SHIELD

## 2015-08-16 VITALS — BP 135/95 | HR 84 | Temp 98.4°F | Resp 18 | Ht 67.0 in | Wt 211.9 lb

## 2015-08-16 DIAGNOSIS — C50411 Malignant neoplasm of upper-outer quadrant of right female breast: Secondary | ICD-10-CM | POA: Diagnosis not present

## 2015-08-16 DIAGNOSIS — M8580 Other specified disorders of bone density and structure, unspecified site: Secondary | ICD-10-CM | POA: Diagnosis not present

## 2015-08-16 DIAGNOSIS — E669 Obesity, unspecified: Secondary | ICD-10-CM

## 2015-08-16 LAB — CBC WITH DIFFERENTIAL/PLATELET
BASO%: 1.5 % (ref 0.0–2.0)
BASOS ABS: 0.1 10*3/uL (ref 0.0–0.1)
EOS%: 6.2 % (ref 0.0–7.0)
Eosinophils Absolute: 0.3 10*3/uL (ref 0.0–0.5)
HEMATOCRIT: 43.5 % (ref 34.8–46.6)
HGB: 14.2 g/dL (ref 11.6–15.9)
LYMPH#: 1.5 10*3/uL (ref 0.9–3.3)
LYMPH%: 28.1 % (ref 14.0–49.7)
MCH: 29.3 pg (ref 25.1–34.0)
MCHC: 32.6 g/dL (ref 31.5–36.0)
MCV: 89.8 fL (ref 79.5–101.0)
MONO#: 0.4 10*3/uL (ref 0.1–0.9)
MONO%: 7.5 % (ref 0.0–14.0)
NEUT#: 3 10*3/uL (ref 1.5–6.5)
NEUT%: 56.7 % (ref 38.4–76.8)
Platelets: 300 10*3/uL (ref 145–400)
RBC: 4.85 10*6/uL (ref 3.70–5.45)
RDW: 13.7 % (ref 11.2–14.5)
WBC: 5.2 10*3/uL (ref 3.9–10.3)

## 2015-08-16 MED ORDER — VENLAFAXINE HCL ER 37.5 MG PO CP24: 37.5000 mg | ORAL_CAPSULE | Freq: Every day | ORAL | Status: DC

## 2015-08-16 NOTE — Progress Notes (Addendum)
[place]  [phone] Fax:([phone]) [phone]     ID: Elizabeth Miles DOB: [birth year]  MR#: [id number]  XLK#:[id number]  Patient Care Team: Elizabeth Quant, MD as PCP - General (Internal Medicine) Elizabeth Quant, MD as Referring Physician (Internal Medicine) Elizabeth Bookbinder, MD as Consulting Physician (General Surgery) Elizabeth Cruel, MD as Consulting Physician (Oncology) Elizabeth Silversmith, MD as Consulting Physician (Radiation Oncology) GYN: Elizabeth Salina MD Elizabeth Miles DDS FAX [phone]  CHIEF COMPLAINT: right breat cancer  CURRENT TREATMENT: fulvestrant; denosumab pending  BREAST CANCER HISTORY: From Dr. Dana Allan earlier notes:  "Elizabeth Miles is a 50 y.o. female. Without significant past medical history who on June 2013 had a mammogram that was normal. But there was on physical exam possibility of a cyst noted in the right breast. The mammogram was negative. In 2014 June patient noted on exam another lump in the right breast. She underwent a diagnostic mammogram on June 10 that showed a right breast nodule in the outer quadrant. She had an ultrasound performed that showed at the 9:30 o'clock position a 3.6 cm area and then added 10:00 position 1.3 cm area with a total area being anywhere between 5-6 cm. The patient went on to have a right breast biopsy performed in [place]. The pathology revealed an invasive ductal carcinoma. This is been confirmed by our pathology as well. The carcinoma and papillary features and was felt to be between a grade 1 and 2. The tumor was estrogen receptor positive strongly (100%) progesterone receptor negative HER-2/neu negative with a Ki-67 that showed a high proliferation rate. Patient is now seen in medical oncology for discussion of treatment options."  Bilateral breast MRI on 08/27/2012 revealed in the right upper quadrant irregular lobulated mass with a satellite nodule or lobulation within 2 mm at  its superior aspect, measured together as 4.5 x 4.0 x 3.8 cm. There was extension of enhancement to the nipple, suggesting nipple involvement may be present. No lymphadenopathy was noted there was no any other area of abnormal enhancement in either breast (clinical stage IIA, T2 N0).  PET scan performed on 08/29/2012 revealed the primary breast cancer measuring 3.2 x 3.3 cm with SUV of 16. It was adjacent nodule along the superiomedial border of the primary mass measuring 1.4 cm which was also hypermetabolic. There were no additional areas of abnormal hypermetabolism. No abnormal hypermetabolic activity was seen in the chest, abdomen/pelvis,within the liver, pancreas, adrenal glands or spleen. No hypermetabolic lymph nodes. In the skeleton, no focal hypermetabolic activity to suggest skeletal metastasis was seen.  Completed neoadjuvant chemotherapy consisting of Q14 day Adriamycin/Cytoxan x 4 cycles on 10/25/2012, followed by one dose of neoadjuvant Taxol on 11/08/2012. She developed significant grade 2 neuropathy and Taxol was discontinued. Then received neoadjuvant chemotherapy consisting of single agent Abraxane given on day 1, 8, 15 of each 28 day cycle. She completed therapy on 11/15/12 - 02/14/13.   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 Repeat HER-2 testing was now positive. The patient was started on tamoxifen March 2015 and on Herceptin the same month.  Her subsequent history is as detailed below.  INTERVAL HISTORY: [name] returns today for follow-up of her estrogen receptor positive breast cancer, accompanied by her mother. She continues on fulvestrant, generally with good tolerance. She still having hot flashes however. She also feels "hot",  when she is outside in the hot weather. She sweating more than normal she feels.  She has been  evaluated by Elizabeth Miles. Elizabeth Miles, her dentist in [place], and she will need some extractions. That will delay our start of denosumab.   REVIEW OF SYSTEMS: Meggin has some pains in her surgical breast, she understands that these are mostly postoperative. She is still very short tempered and "cursing a lot". She is swimming in her mother swimming that helps. She is not otherwise exercising regularly. A detailed review of systems today was noncontributory  PAST MEDICAL HISTORY: Past Medical History  Diagnosis Date  . Breast cancer ([place]) 08/06/12    invasive ductal carcioma  . GERD (gastroesophageal reflux disease)   . GERD (gastroesophageal reflux disease) 08/22/2012  . Allergy   . Complication of anesthesia     1986 ; problem waking up  . Anxiety   . Wears glasses     PAST SURGICAL HISTORY: Past Surgical History  Procedure Laterality Date  . Dilation and curettage of uterus  1993  . Cervical ablation  1983  . Breast biopsy Right 08/06/12  . Popliteal synovial cyst excision  1970  . Portacath placement Left 09/12/2012    Procedure: INSERTION PORT-A-CATH;  Surgeon: Elizabeth Bookbinder, MD;  Location: [facility];  Service: General;  Laterality: Left;  . Breast lumpectomy with needle localization and axillary sentinel lymph node bx Right 03/10/2013    Procedure: BREAST LUMPECTOMY WITH NEEDLE LOCALIZATION AND AXILLARY SENTINEL LYMPH NODE BIOPSY;  Surgeon: Elizabeth Bookbinder, MD;  Location: [facility];  Service: General;  Laterality: Right;  . Port-a-cath removal Left 03/10/2013    Procedure: REMOVAL PORT-A-CATH;  Surgeon: Elizabeth Bookbinder, MD;  Location: [facility];  Service: General;  Laterality: Left;    FAMILY HISTORY Family History  Problem Relation Age of Onset  . Hypertension Mother   . Bladder Cancer Maternal Uncle 68  . Cancer Paternal Grandmother 8    lung cancer  . Lung cancer Paternal Grandfather     dx in his 39s  . COPD Paternal Uncle    the patient's  parents are living and in good health. The patient has one brother, no sisters. There is no history of breast or ovarian cancer in the family to her knowledge  GYNECOLOGIC HISTORY:  Patient's last menstrual period was 03/12/2012. Menarche age 36, first live birth age 56. The patient is GX P3. 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 Her husband, Jori Moll, is a Tour manager. A daughter, Lovena Le, 21, lives in [place] and daughter Caryl Pina just had a baby girl, the patient's first grandchild. Son Jori Moll "Carlynn Spry" is 70 and at home. The patient is a Psychologist, forensic.    ADVANCED DIRECTIVES: Not in place   HEALTH MAINTENANCE: Social History  Substance Use Topics  . Smoking status: Never Smoker   . Smokeless tobacco: Never Used  . Alcohol Use: No     Colonoscopy:  PAP:  Bone density:  Lipid panel:  Allergies  Allergen Reactions  . Darvocet [Propoxyphene N-Acetaminophen] Shortness Of Breath and Swelling  . Shellfish Allergy Shortness Of Breath and Swelling  . Other     CT dye, "chest hurt" "makes me feel cold"    Current Outpatient Prescriptions  Medication Sig Dispense Refill  . acetaminophen (TYLENOL) 500 MG tablet Take 1,000 mg by mouth every 6 (six) hours  as needed. Reported on 06/14/2015    . aspirin 325 MG tablet Take 325 mg by mouth daily.    . cetirizine (ZYRTEC) 10 MG tablet Take 10 mg by mouth daily.    . cholecalciferol (VITAMIN D) 1000 UNITS tablet Take 1 tablet (1,000 Units total) by mouth daily. 90 tablet 3  . Dexlansoprazole (DEXILANT) 30 MG capsule Take 1 capsule (30 mg total) by mouth daily. 90 capsule 4  . ibuprofen (ADVIL,MOTRIN) 800 MG tablet Take 800 mg by mouth every 8 (eight) hours as needed for moderate pain (for back pain). Reported on 06/14/2015    . metroNIDAZOLE (METROGEL) 1 % gel Apply topically daily. 45 g 0  .  venlafaxine XR (EFFEXOR-XR) 75 MG 24 hr capsule Take 1 capsule (75 mg total) by mouth daily with breakfast. 30 capsule 12   No current facility-administered medications for this visit.    OBJECTIVE: Middle-aged white woman Who appears stated age  89 Vitals:   08/16/15 1037  BP: 135/95  Pulse: 84  Temp: 98.4 F (36.9 C)  Resp: 18     Body mass index is 33.18 kg/(m^2).    ECOG FS:1 - Symptomatic but completely ambulatory  Sclerae unicteric, EOMs intact Oropharynx clear, dentition in good repair No cervical or supraclavicular adenopathy Lungs no rales or rhonchi Heart regular rate and rhythm Abd soft, nontender, positive bowel sounds MSK no focal spinal tenderness, no upper extremity lymphedema Neuro: nonfocal, well oriented, appropriate affect Breasts: The right breast is status post lumpectomy and radiation. There is no evidence of local recurrence. The left axilla is benign. The left breast is unremarkable.    LAB RESULTS:  CMP     Component Value Date/Time   NA 140 07/08/2015 1003   NA 144 08/17/2013 0346   K 4.1 07/08/2015 1003   K 3.8 08/17/2013 0346   CL 105 08/17/2013 0346   CO2 29 07/08/2015 1003   GLUCOSE 84 07/08/2015 1003   GLUCOSE 155* 08/17/2013 0346   BUN 11.4 07/08/2015 1003   BUN 8 08/17/2013 0346   CREATININE 0.8 07/08/2015 1003   CREATININE 0.80 08/17/2013 0346   CALCIUM 9.8 07/08/2015 1003   PROT 7.7 07/08/2015 1003   ALBUMIN 3.9 07/08/2015 1003   AST 17 07/08/2015 1003   ALT 21 07/08/2015 1003   ALKPHOS 86 07/08/2015 1003   BILITOT 0.62 07/08/2015 1003    I No results found for: SPEP  Lab Results  Component Value Date   WBC 5.2 08/16/2015   NEUTROABS 3.0 08/16/2015   HGB 14.2 08/16/2015   HCT 43.5 08/16/2015   MCV 89.8 08/16/2015   PLT 300 08/16/2015      Chemistry      Component Value Date/Time   NA 140 07/08/2015 1003   NA 144 08/17/2013 0346   K 4.1 07/08/2015 1003   K 3.8 08/17/2013 0346   CL 105 08/17/2013 0346   CO2  29 07/08/2015 1003   BUN 11.4 07/08/2015 1003   BUN 8 08/17/2013 0346   CREATININE 0.8 07/08/2015 1003   CREATININE 0.80 08/17/2013 0346      Component Value Date/Time   CALCIUM 9.8 07/08/2015 1003   ALKPHOS 86 07/08/2015 1003   AST 17 07/08/2015 1003   ALT 21 07/08/2015 1003   BILITOT 0.62 07/08/2015 1003       No results found for: LABCA2  No components found for: LABCA125  No results for input(s): INR in the last 168 hours.  Urinalysis    Component Value  Date/Time   LABSPEC 1.010 12/13/2012 1435   PHURINE 7.0 12/13/2012 1435   GLUCOSEU Negative 12/13/2012 1435   HGBUR Trace 12/13/2012 1435   BILIRUBINUR Negative 12/13/2012 1435   KETONESUR Negative 12/13/2012 1435   PROTEINUR Negative 12/13/2012 1435   UROBILINOGEN 0.2 12/13/2012 1435   NITRITE Negative 12/13/2012 1435   LEUKOCYTESUR Small 12/13/2012 1435    STUDIES:  US Soft Tissue Head/neck  08/13/2015  CLINICAL DATA:  Goiter on physical exam. History of breast carcinoma. EXAM: THYROID ULTRASOUND TECHNIQUE: Ultrasound examination of the thyroid gland and adjacent soft tissues was performed. COMPARISON:  None. FINDINGS: Right thyroid lobe Measurements: 4.2 x 1.5 x 1.9 cm. Homogeneous background echotexture. No nodules visualized. Left thyroid lobe Measurements: 4.4 x 1.6 x 1.8 cm.  No nodules visualized. Isthmus Thickness: 0.3 cm.  No nodules visualized. Lymphadenopathy None visualized. IMPRESSION: Normal Electronically Signed   By: Lucrezia Europe M.D.   On: 08/13/2015 11:38     ASSESSMENT: 50 y.o. [place] woman  (1) status post right breast biopsy 08/06/2012 for a clinical mT2 N0, stage IIA invasive ductal carcinoma, estrogen receptor 3+ positive, progesterone receptor and HER-2 negative, Ki67 3+ [[id number]]  (2) treated neoadjuvantly with dose dense cyclophosphamide and doxorubicin x4, completed 10/25/2012, followed by a single dose of weekly paclitaxel, poorly tolerated; followed by 11 doses of Abraxane  completed 02/14/2013  (3) status post right lumpectomy and sentinel lymph node sampling 03/10/2013 for a ypT2 pN0, stage IIA invasive ductal carcinoma, grade 2, estrogen receptor 100% positive, progesterone receptor 21% positive, with an MIB-1 of 14%, and HER-2 amplified, the signals ratio being 2.52, the number per cell 2.90  (4) adjuvant radiation completed March 2015 (?)  (5) started tamoxifen March 2015, stopped 05/25/14 because of superficial clot to left cephalic vein; began anastrozole on 05/29/14, discontinued after 2 weeks with rash; started letrozole 05/24/2015, discontinued after 5 days use because of angioedema-like symptoms  (6) started trastuzumab 05/23/2013, completed 05/15/14  (7) osteopenia: With T score of -1.7 on bone density scan at [facility] 06/10/2014.   (a) waiting on dental clearance to start zolendronate  (8) fulvestrant started 06/23/2015  PLAN: Te is doing well with the fulvestrant. The plan is to continue that for a total of 5 years.  She remains irritable and a little bit depressed. We are increasing the venlafaxine to 75+37.5. I put the order in. If that does not quite do it we will increase at 250 which of course would be more commonly done but she did not want to go "that high that soon".  I gave her a copy of her ultrasound of the thyroid which was entirely normal  She will see me again in September. She knows to call for any problems that may develop before that visit.   Elizabeth Cruel, MD   08/16/2015 10:52 AM

## 2015-08-19 ENCOUNTER — Other Ambulatory Visit: Payer: BLUE CROSS/BLUE SHIELD

## 2015-08-19 ENCOUNTER — Ambulatory Visit (HOSPITAL_BASED_OUTPATIENT_CLINIC_OR_DEPARTMENT_OTHER): Payer: BLUE CROSS/BLUE SHIELD

## 2015-08-19 VITALS — BP 120/85 | HR 83 | Temp 98.5°F | Resp 18

## 2015-08-19 DIAGNOSIS — C50411 Malignant neoplasm of upper-outer quadrant of right female breast: Secondary | ICD-10-CM | POA: Diagnosis not present

## 2015-08-19 DIAGNOSIS — M8580 Other specified disorders of bone density and structure, unspecified site: Secondary | ICD-10-CM

## 2015-08-19 DIAGNOSIS — Z5111 Encounter for antineoplastic chemotherapy: Secondary | ICD-10-CM

## 2015-08-19 MED ORDER — FULVESTRANT 250 MG/5ML IM SOLN
500.0000 mg | INTRAMUSCULAR | Status: DC
  Administered 2015-08-19: 500 mg via INTRAMUSCULAR
  Filled 2015-08-19: qty 10

## 2015-08-19 NOTE — Patient Instructions (Signed)

## 2015-09-10 ENCOUNTER — Other Ambulatory Visit: Payer: Self-pay | Admitting: Oncology

## 2015-09-10 NOTE — Telephone Encounter (Signed)
Chart reviewed.

## 2015-09-12 ENCOUNTER — Other Ambulatory Visit: Payer: Self-pay | Admitting: Oncology

## 2015-09-13 NOTE — Telephone Encounter (Signed)
Chart reviewed.

## 2015-09-16 ENCOUNTER — Ambulatory Visit (HOSPITAL_BASED_OUTPATIENT_CLINIC_OR_DEPARTMENT_OTHER): Payer: BLUE CROSS/BLUE SHIELD

## 2015-09-16 ENCOUNTER — Other Ambulatory Visit (HOSPITAL_BASED_OUTPATIENT_CLINIC_OR_DEPARTMENT_OTHER): Payer: BLUE CROSS/BLUE SHIELD

## 2015-09-16 VITALS — BP 135/88 | HR 92 | Temp 98.2°F | Resp 20

## 2015-09-16 DIAGNOSIS — Z5111 Encounter for antineoplastic chemotherapy: Secondary | ICD-10-CM

## 2015-09-16 DIAGNOSIS — C50411 Malignant neoplasm of upper-outer quadrant of right female breast: Secondary | ICD-10-CM | POA: Diagnosis not present

## 2015-09-16 DIAGNOSIS — M8580 Other specified disorders of bone density and structure, unspecified site: Secondary | ICD-10-CM

## 2015-09-16 LAB — COMPREHENSIVE METABOLIC PANEL
ALBUMIN: 3.7 g/dL (ref 3.5–5.0)
ALK PHOS: 89 U/L (ref 40–150)
ALT: 32 U/L (ref 0–55)
ANION GAP: 10 meq/L (ref 3–11)
AST: 19 U/L (ref 5–34)
BUN: 7.5 mg/dL (ref 7.0–26.0)
CALCIUM: 9.3 mg/dL (ref 8.4–10.4)
CHLORIDE: 107 meq/L (ref 98–109)
CO2: 26 mEq/L (ref 22–29)
Creatinine: 0.8 mg/dL (ref 0.6–1.1)
EGFR: 88 mL/min/{1.73_m2} — AB (ref >90)
Glucose: 72 mg/dl (ref 70–140)
POTASSIUM: 3.7 meq/L (ref 3.5–5.1)
Sodium: 142 mEq/L (ref 136–145)
Total Bilirubin: 0.46 mg/dL (ref 0.20–1.20)
Total Protein: 7.3 g/dL (ref 6.4–8.3)

## 2015-09-16 LAB — CBC WITH DIFFERENTIAL/PLATELET
BASO%: 1.1 % (ref 0.0–2.0)
BASOS ABS: 0.1 10*3/uL (ref 0.0–0.1)
EOS ABS: 0.3 10*3/uL (ref 0.0–0.5)
EOS%: 5.7 % (ref 0.0–7.0)
HEMATOCRIT: 40 % (ref 34.8–46.6)
HEMOGLOBIN: 13.2 g/dL (ref 11.6–15.9)
LYMPH#: 1.3 10*3/uL (ref 0.9–3.3)
LYMPH%: 28.5 % (ref 14.0–49.7)
MCH: 29.4 pg (ref 25.1–34.0)
MCHC: 32.9 g/dL (ref 31.5–36.0)
MCV: 89.3 fL (ref 79.5–101.0)
MONO#: 0.3 10*3/uL (ref 0.1–0.9)
MONO%: 7.4 % (ref 0.0–14.0)
NEUT#: 2.6 10*3/uL (ref 1.5–6.5)
NEUT%: 57.3 % (ref 38.4–76.8)
PLATELETS: 268 10*3/uL (ref 145–400)
RBC: 4.48 10*6/uL (ref 3.70–5.45)
RDW: 13.6 % (ref 11.2–14.5)
WBC: 4.5 10*3/uL (ref 3.9–10.3)

## 2015-09-16 MED ORDER — FULVESTRANT 250 MG/5ML IM SOLN
500.0000 mg | INTRAMUSCULAR | Status: DC
  Administered 2015-09-16: 500 mg via INTRAMUSCULAR
  Filled 2015-09-16: qty 10

## 2015-09-16 NOTE — Patient Instructions (Signed)

## 2015-09-17 ENCOUNTER — Other Ambulatory Visit: Payer: Self-pay | Admitting: Oncology

## 2015-09-27 ENCOUNTER — Other Ambulatory Visit: Payer: Self-pay

## 2015-09-27 ENCOUNTER — Ambulatory Visit (HOSPITAL_BASED_OUTPATIENT_CLINIC_OR_DEPARTMENT_OTHER): Payer: BLUE CROSS/BLUE SHIELD

## 2015-09-27 ENCOUNTER — Ambulatory Visit (HOSPITAL_BASED_OUTPATIENT_CLINIC_OR_DEPARTMENT_OTHER): Payer: BLUE CROSS/BLUE SHIELD | Admitting: Nurse Practitioner

## 2015-09-27 ENCOUNTER — Telehealth: Payer: Self-pay | Admitting: *Deleted

## 2015-09-27 VITALS — BP 126/82 | HR 105 | Temp 98.4°F | Resp 18 | Ht 67.0 in | Wt 216.9 lb

## 2015-09-27 DIAGNOSIS — C50411 Malignant neoplasm of upper-outer quadrant of right female breast: Secondary | ICD-10-CM | POA: Diagnosis not present

## 2015-09-27 DIAGNOSIS — M2560 Stiffness of unspecified joint, not elsewhere classified: Secondary | ICD-10-CM | POA: Diagnosis not present

## 2015-09-27 DIAGNOSIS — M25562 Pain in left knee: Secondary | ICD-10-CM

## 2015-09-27 DIAGNOSIS — M25569 Pain in unspecified knee: Secondary | ICD-10-CM

## 2015-09-27 DIAGNOSIS — R350 Frequency of micturition: Secondary | ICD-10-CM

## 2015-09-27 LAB — CBC WITH DIFFERENTIAL/PLATELET
BASO%: 1.3 % (ref 0.0–2.0)
Basophils Absolute: 0.1 10*3/uL (ref 0.0–0.1)
EOS%: 6 % (ref 0.0–7.0)
Eosinophils Absolute: 0.4 10*3/uL (ref 0.0–0.5)
HEMATOCRIT: 42.5 % (ref 34.8–46.6)
HGB: 14.1 g/dL (ref 11.6–15.9)
LYMPH#: 1.6 10*3/uL (ref 0.9–3.3)
LYMPH%: 26.1 % (ref 14.0–49.7)
MCH: 29.3 pg (ref 25.1–34.0)
MCHC: 33.1 g/dL (ref 31.5–36.0)
MCV: 88.3 fL (ref 79.5–101.0)
MONO#: 0.5 10*3/uL (ref 0.1–0.9)
MONO%: 8.1 % (ref 0.0–14.0)
NEUT%: 58.5 % (ref 38.4–76.8)
NEUTROS ABS: 3.5 10*3/uL (ref 1.5–6.5)
PLATELETS: 322 10*3/uL (ref 145–400)
RBC: 4.81 10*6/uL (ref 3.70–5.45)
RDW: 13.5 % (ref 11.2–14.5)
WBC: 6.1 10*3/uL (ref 3.9–10.3)

## 2015-09-27 LAB — COMPREHENSIVE METABOLIC PANEL
ALBUMIN: 3.8 g/dL (ref 3.5–5.0)
ALK PHOS: 95 U/L (ref 40–150)
ALT: 32 U/L (ref 0–55)
AST: 28 U/L (ref 5–34)
Anion Gap: 11 mEq/L (ref 3–11)
BILIRUBIN TOTAL: 0.61 mg/dL (ref 0.20–1.20)
BUN: 8.1 mg/dL (ref 7.0–26.0)
CALCIUM: 9.7 mg/dL (ref 8.4–10.4)
CO2: 25 mEq/L (ref 22–29)
CREATININE: 0.8 mg/dL (ref 0.6–1.1)
Chloride: 106 mEq/L (ref 98–109)
EGFR: 86 mL/min/{1.73_m2} — ABNORMAL LOW (ref >90)
Glucose: 120 mg/dl (ref 70–140)
Potassium: 3.5 mEq/L (ref 3.5–5.1)
Sodium: 142 mEq/L (ref 136–145)
TOTAL PROTEIN: 7.7 g/dL (ref 6.4–8.3)

## 2015-09-27 NOTE — Telephone Encounter (Signed)
Elizabeth Miles called to this RN per visit to [facility] ER due to severe leg pain and recommendation by on call [facility].  She states she obtained a doppler of her leg " which was negative " - but was requested to follow up today with this office.  Elizabeth Miles is currently obtained faslodex monthly due to rashes occurring with aromatase inhibitors.  Elizabeth Miles states she is having general joint pain since being on injection - but pain in leg is much more severe and interfering with ADL's.  She also states over all malaise.  Plan per call is pt will come in for assessment by [facility] for further recommendations

## 2015-09-28 ENCOUNTER — Ambulatory Visit (HOSPITAL_COMMUNITY)
Admission: RE | Admit: 2015-09-28 | Discharge: 2015-09-28 | Disposition: A | Discharge status: Home / Self Care | Payer: BLUE CROSS/BLUE SHIELD | Source: Ambulatory Visit | Attending: Nurse Practitioner | Admitting: Nurse Practitioner

## 2015-09-28 ENCOUNTER — Telehealth: Payer: Self-pay | Admitting: Nurse Practitioner

## 2015-09-28 ENCOUNTER — Other Ambulatory Visit: Payer: Self-pay | Admitting: Nurse Practitioner

## 2015-09-28 ENCOUNTER — Ambulatory Visit (HOSPITAL_BASED_OUTPATIENT_CLINIC_OR_DEPARTMENT_OTHER): Payer: BLUE CROSS/BLUE SHIELD

## 2015-09-28 ENCOUNTER — Telehealth: Payer: Self-pay | Admitting: *Deleted

## 2015-09-28 DIAGNOSIS — C50411 Malignant neoplasm of upper-outer quadrant of right female breast: Secondary | ICD-10-CM

## 2015-09-28 DIAGNOSIS — R350 Frequency of micturition: Secondary | ICD-10-CM

## 2015-09-28 LAB — URINALYSIS, MICROSCOPIC - CHCC
BILIRUBIN (URINE): NEGATIVE
BLOOD: NEGATIVE
GLUCOSE UR CHCC: NEGATIVE mg/dL
KETONES: NEGATIVE mg/dL
Leukocyte Esterase: NEGATIVE
Nitrite: NEGATIVE
PH: 5 (ref 4.6–8.0)
PROTEIN: NEGATIVE mg/dL
Specific Gravity, Urine: 1.02 (ref 1.003–1.035)
Urobilinogen, UR: 0.2 mg/dL (ref 0.2–1)

## 2015-09-28 NOTE — Telephone Encounter (Signed)
sch pt appt per pof. pt here per pof

## 2015-09-28 NOTE — Telephone Encounter (Signed)
TC to patient to inform her of negative urinalysis report and negative knee xray. Spoke with patient and she voiced understanding. Pt to follow up with PCP re: knee pain as needed.

## 2015-09-29 ENCOUNTER — Encounter: Payer: Self-pay | Admitting: Nurse Practitioner

## 2015-09-29 DIAGNOSIS — R350 Frequency of micturition: Secondary | ICD-10-CM | POA: Insufficient documentation

## 2015-09-29 DIAGNOSIS — M25562 Pain in left knee: Secondary | ICD-10-CM | POA: Insufficient documentation

## 2015-09-29 LAB — URINE CULTURE

## 2015-09-29 NOTE — Assessment & Plan Note (Signed)
Patient has a history of chronic, generalized joint achiness and stiffness.  She has recently had her Effexor medication, increased in hopes of helping with both her moodiness and generalized achiness.  She states that she prefers to continue slowly increasing the Effexor; since she is very sensitive to all medications.  See further notes for details of today's visit.  Advised patient that she very well may need to follow back up with her primary care physician in regards to her generalized achiness and joint stiffness.  May need to consider either orthopedic referral and/or a endocrinologist for further evaluation.  Advised patient to call/return or go directly to the emergency department for any worsening symptoms whatsoever.

## 2015-09-29 NOTE — Assessment & Plan Note (Signed)
Patient is currently undergoing monthly Faslodex injections.  She is scheduled to return on 10/14/2015 for labs and her next injection.

## 2015-09-29 NOTE — Assessment & Plan Note (Signed)
Patient states that she suffers with chronic generalized joint discomfort; but has noticed increased pain to her left medial knee within the past few days.  She states that she searched all at the urgent care center emergency departments locally.  Last night; and then finally went to the [facility] emergency department in [place], [place] for further evaluation of her left knee pain since that was the only place that she could find a Doppler ultrasound at night.  She states that she was fully evaluated by the emergency department there; and a Doppler ultrasound of the left leg was negative for DVT.  She continues to experience pain; states she does occasionally limp as well.  Patient denies any recent injury or trauma to her left leg.  She also denies any calf pain or other new symptoms.  Exam today reveals some mild tenderness to the left medial knee on exam.  There is no obvious injury or trauma to the site.  Patient has some questionable edema to bilateral lower extremities.  Will order a left knee x-ray for thoroughness sake-which patient will obtain first thing in the morning per her request.  If x-ray is negative-patient was advised to follow-up with her primary care physician or with a orthopedic specialist for further evaluation and management.

## 2015-09-29 NOTE — Progress Notes (Signed)
SYMPTOM MANAGEMENT CLINIC    Chief Complaint: knee pain  HPI:  Elizabeth Miles 50 y.o. female diagnosed with breast cancer.  Currently undergoing Faslodex injections.  Presents to the [facility] today with complaint of left knee pain.    No history exists.    Review of Systems  Constitutional: Positive for malaise/fatigue.  Musculoskeletal: Positive for joint pain.  All other systems reviewed and are negative.   Past Medical History:  Diagnosis Date  . Allergy   . Anxiety   . 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 Complication of anesthesia    1986 ; problem waking up  . GERD (gastroesophageal reflux disease)   . GERD (gastroesophageal reflux disease) 08/22/2012  . Wears glasses     Past Surgical History:  Procedure Laterality Date  . BREAST BIOPSY Right 08/06/12  . BREAST LUMPECTOMY WITH NEEDLE LOCALIZATION AND AXILLARY SENTINEL LYMPH NODE BX Right 03/10/2013   Procedure: BREAST LUMPECTOMY WITH NEEDLE LOCALIZATION AND AXILLARY SENTINEL LYMPH NODE BIOPSY;  Surgeon: Rolm Bookbinder, MD;  Location: [facility];  Service: General;  Laterality: Right;  . [place]  . DILATION AND CURETTAGE OF UTERUS  1993  . POPLITEAL SYNOVIAL CYST EXCISION  1970  . PORT-A-CATH REMOVAL Left 03/10/2013   Procedure: REMOVAL PORT-A-CATH;  Surgeon: Rolm Bookbinder, MD;  Location: [facility];  Service: General;  Laterality: Left;  . PORTACATH PLACEMENT Left 09/12/2012   Procedure: INSERTION PORT-A-CATH;  Surgeon: Rolm Bookbinder, MD;  Location: [facility];  Service: General;  Laterality: Left;    has GERD (gastroesophageal reflux disease); Obesity (BMI 30-39.9); Severe obesity (BMI >= 40) ([place]); Breast cancer of upper-outer quadrant of right female breast ([place]); Left facial numbness; Joint stiffness; Superficial vein thrombosis; Anxiety; Osteopenia determined by x-ray; Knee pain, left; and Urinary frequency on her problem list.    is  allergic to anastrozole; darvocet [propoxyphene n-acetaminophen]; shellfish allergy; and other.    Medication List       Accurate as of 09/27/15 11:59 PM. Always use your most recent med list.          acetaminophen 500 MG tablet Commonly known as:  TYLENOL Take 1,000 mg by mouth every 6 (six) hours as needed. Reported on 06/14/2015   aspirin 325 MG tablet Take 325 mg by mouth daily.   cetirizine 10 MG tablet Commonly known as:  ZYRTEC Take 10 mg by mouth daily.   [employer] D3 1000 units capsule Generic drug:  Cholecalciferol TAKE ONE CAPSULE BY MOUTH EVERY DAY   Dexlansoprazole 30 MG capsule Commonly known as:  DEXILANT Take 1 capsule (30 mg total) by mouth daily.   ibuprofen 800 MG tablet Commonly known as:  ADVIL,MOTRIN Take 800 mg by mouth every 8 (eight) hours as needed for moderate pain (for back pain). Reported on 06/14/2015   metroNIDAZOLE 1 % gel Commonly known as:  METROGEL Apply topically daily.   venlafaxine XR 75 MG 24 hr capsule Commonly known as:  EFFEXOR-XR Take 1 capsule (75 mg total) by mouth daily with breakfast.   venlafaxine XR 37.5 MG 24 hr capsule Commonly known as:  EFFEXOR-XR Take 1 capsule (37.5 mg total) by mouth daily with breakfast.        PHYSICAL EXAMINATION  Oncology Vitals 09/27/2015 09/16/2015  Height 170 cm -  Weight 98.385 kg -  Weight (lbs) 216 lbs 14 oz -  BMI (kg/m2) 33.97 kg/m2 -  Temp 98.4 98.2  Pulse 105 92  Resp 18 20  SpO2 100 99  BSA (m2) 2.16 m2 -   BP Readings from Last 2 Encounters:  09/27/15 126/82  09/16/15 135/88    Physical Exam  Constitutional: She is oriented to person, place, and time and well-developed, well-nourished, and in no distress.  HENT:  Head: Normocephalic and atraumatic.  Mouth/Throat: Oropharynx is clear and moist.  Eyes: Conjunctivae and EOM are normal. Pupils are equal, round, and reactive to light. Right eye exhibits no discharge. Left eye exhibits no discharge. No scleral icterus.    Neck: Normal range of motion. Neck supple. No JVD present. No tracheal deviation present. No thyromegaly present.  Cardiovascular: Normal rate, regular rhythm, normal heart sounds and intact distal pulses.   Pulmonary/Chest: Effort normal and breath sounds normal. No respiratory distress. She has no wheezes. She has no rales. She exhibits no tenderness.  Abdominal: Soft. Bowel sounds are normal. She exhibits no distension and no mass. There is no tenderness. There is no rebound and no guarding.  Musculoskeletal: Normal range of motion. She exhibits edema and tenderness. She exhibits no deformity.  Mild tenderness to the left medial knee with palpation; but no evidence of injury or trauma to the site.  Also, some trace edema to bilateral lower extremities.  Lymphadenopathy:    She has no cervical adenopathy.  Neurological: She is alert and oriented to person, place, and time. Gait normal.  Skin: Skin is warm and dry. No rash noted. No erythema. No pallor.  Psychiatric:  Very anxious.  Nursing note and vitals reviewed.   LABORATORY DATA:. Appointment on 09/27/2015  Component Date Value Ref Range Status  . WBC 09/27/2015 6.1  3.9 - 10.3 10e3/uL Final  . NEUT# 09/27/2015 3.5  1.5 - 6.5 10e3/uL Final  . HGB 09/27/2015 14.1  11.6 - 15.9 g/dL Final  . HCT 09/27/2015 42.5  34.8 - 46.6 % Final  . Platelets 09/27/2015 322  145 - 400 10e3/uL Final  . MCV 09/27/2015 88.3  79.5 - 101.0 fL Final  . MCH 09/27/2015 29.3  25.1 - 34.0 pg Final  . MCHC 09/27/2015 33.1  31.5 - 36.0 g/dL Final  . RBC 09/27/2015 4.81  3.70 - 5.45 10e6/uL Final  . RDW 09/27/2015 13.5  11.2 - 14.5 % Final  . lymph# 09/27/2015 1.6  0.9 - 3.3 10e3/uL Final  . MONO# 09/27/2015 0.5  0.1 - 0.9 10e3/uL Final  . Eosinophils Absolute 09/27/2015 0.4  0.0 - 0.5 10e3/uL Final  . Basophils Absolute 09/27/2015 0.1  0.0 - 0.1 10e3/uL Final  . NEUT% 09/27/2015 58.5  38.4 - 76.8 % Final  . LYMPH% 09/27/2015 26.1  14.0 - 49.7 % Final  .  MONO% 09/27/2015 8.1  0.0 - 14.0 % Final  . EOS% 09/27/2015 6.0  0.0 - 7.0 % Final  . BASO% 09/27/2015 1.3  0.0 - 2.0 % Final  . Sodium 09/27/2015 142  136 - 145 mEq/L Final  . Potassium 09/27/2015 3.5  3.5 - 5.1 mEq/L Final  . Chloride 09/27/2015 106  98 - 109 mEq/L Final  . CO2 09/27/2015 25  22 - 29 mEq/L Final  . Glucose 09/27/2015 120  70 - 140 mg/dl Final  . BUN 09/27/2015 8.1  7.0 - 26.0 mg/dL Final  . Creatinine 09/27/2015 0.8  0.6 - 1.1 mg/dL Final  . Total Bilirubin 09/27/2015 0.61  0.20 - 1.20 mg/dL Final  . Alkaline Phosphatase 09/27/2015 95  40 - 150 U/L Final  . AST 09/27/2015 28  5 -  34 U/L Final  . ALT 09/27/2015 32  0 - 55 U/L Final  . Total Protein 09/27/2015 7.7  6.4 - 8.3 g/dL Final  . Albumin 09/27/2015 3.8  3.5 - 5.0 g/dL Final  . Calcium 09/27/2015 9.7  8.4 - 10.4 mg/dL Final  . Anion Gap 09/27/2015 11  3 - 11 mEq/L Final  . EGFR 09/27/2015 86* >90 ml/min/1.73 m2 Final    RADIOGRAPHIC STUDIES: Dg Knee Complete 4 Views Left  Result Date: 09/28/2015 CLINICAL DATA:  Anterior and medial left knee pain beginning last week. History of breast cancer. EXAM: LEFT KNEE - COMPLETE 4+ VIEW COMPARISON:  09/25/2014 radiographs of the left tibia and fibula FINDINGS: There is no evidence of acute fracture, dislocation, or knee joint effusion. No destructive osseous lesion is seen. Femorotibial joint space widths are preserved. No focal soft tissue abnormality is seen. IMPRESSION: Negative. Electronically Signed   By: Logan Bores M.D.   On: 09/28/2015 12:37    ASSESSMENT/PLAN:    Urinary frequency Patient states that she has noted some increased urinary frequency and some dribbling of urine when she urinates recently.  She denies any sure you, hematuria, or recent fever/chills.  Patient will be returning tomorrow morning to give a urine sample.  Urinalysis and urine culture results are pending.  Knee pain, left Patient states that she suffers with chronic generalized joint  discomfort; but has noticed increased pain to her left medial knee within the past few days.  She states that she searched all at the urgent care center emergency departments locally.  Last night; and then finally went to the [facility] emergency department in [place], [place] for further evaluation of her left knee pain since that was the only place that she could find a Doppler ultrasound at night.  She states that she was fully evaluated by the emergency department there; and a Doppler ultrasound of the left leg was negative for DVT.  She continues to experience pain; states she does occasionally limp as well.  Patient denies any recent injury or trauma to her left leg.  She also denies any calf pain or other new symptoms.  Exam today reveals some mild tenderness to the left medial knee on exam.  There is no obvious injury or trauma to the site.  Patient has some questionable edema to bilateral lower extremities.  Will order a left knee x-ray for thoroughness sake-which patient will obtain first thing in the morning per her request.  If x-ray is negative-patient was advised to follow-up with her primary care physician or with a orthopedic specialist for further evaluation and management.  Joint stiffness Patient has a history of chronic, generalized joint achiness and stiffness.  She has recently had her Effexor medication, increased in hopes of helping with both her moodiness and generalized achiness.  She states that she prefers to continue slowly increasing the Effexor; since she is very sensitive to all medications.  See further notes for details of today's visit.  Advised patient that she very well may need to follow back up with her primary care physician in regards to her generalized achiness and joint stiffness.  May need to consider either orthopedic referral and/or a endocrinologist for further evaluation.  Advised patient to call/return or go directly to the emergency  department for any worsening symptoms whatsoever.  Breast cancer of upper-outer quadrant of right female breast Patient is currently undergoing monthly Faslodex injections.  She is scheduled to return on 10/14/2015 for labs and her  next injection.   Patient stated understanding of all instructions; and was in agreement with this plan of care. The patient knows to call the clinic with any problems, questions or concerns.   Total time spent with patient was 40 minutes;  with greater than 75 percent of that time spent in face to face counseling regarding patient's symptoms,  and coordination of care and follow up.  Disclaimer:This dictation was prepared with Dragon/digital dictation along with [employer]. Any transcriptional errors that result from this process are unintentional.  Drue Second, NP 09/29/2015

## 2015-09-29 NOTE — Assessment & Plan Note (Signed)
Patient states that she has noted some increased urinary frequency and some dribbling of urine when she urinates recently.  She denies any sure you, hematuria, or recent fever/chills.  Patient will be returning tomorrow morning to give a urine sample.  Urinalysis and urine culture results are pending.

## 2015-10-14 ENCOUNTER — Other Ambulatory Visit (HOSPITAL_BASED_OUTPATIENT_CLINIC_OR_DEPARTMENT_OTHER): Payer: BLUE CROSS/BLUE SHIELD

## 2015-10-14 ENCOUNTER — Ambulatory Visit (HOSPITAL_BASED_OUTPATIENT_CLINIC_OR_DEPARTMENT_OTHER): Payer: BLUE CROSS/BLUE SHIELD

## 2015-10-14 VITALS — BP 131/90 | HR 87 | Temp 98.5°F | Resp 20

## 2015-10-14 DIAGNOSIS — C50411 Malignant neoplasm of upper-outer quadrant of right female breast: Secondary | ICD-10-CM

## 2015-10-14 DIAGNOSIS — Z5111 Encounter for antineoplastic chemotherapy: Secondary | ICD-10-CM | POA: Diagnosis not present

## 2015-10-14 DIAGNOSIS — M8580 Other specified disorders of bone density and structure, unspecified site: Secondary | ICD-10-CM

## 2015-10-14 LAB — CBC WITH DIFFERENTIAL/PLATELET
BASO%: 1 % (ref 0.0–2.0)
BASOS ABS: 0 10*3/uL (ref 0.0–0.1)
EOS ABS: 0.2 10*3/uL (ref 0.0–0.5)
EOS%: 4.4 % (ref 0.0–7.0)
HEMATOCRIT: 41.2 % (ref 34.8–46.6)
HGB: 13.5 g/dL (ref 11.6–15.9)
LYMPH#: 1.5 10*3/uL (ref 0.9–3.3)
LYMPH%: 30 % (ref 14.0–49.7)
MCH: 29.2 pg (ref 25.1–34.0)
MCHC: 32.7 g/dL (ref 31.5–36.0)
MCV: 89.1 fL (ref 79.5–101.0)
MONO#: 0.5 10*3/uL (ref 0.1–0.9)
MONO%: 9.4 % (ref 0.0–14.0)
NEUT#: 2.7 10*3/uL (ref 1.5–6.5)
NEUT%: 55.2 % (ref 38.4–76.8)
PLATELETS: 291 10*3/uL (ref 145–400)
RBC: 4.63 10*6/uL (ref 3.70–5.45)
RDW: 13.1 % (ref 11.2–14.5)
WBC: 4.9 10*3/uL (ref 3.9–10.3)

## 2015-10-14 MED ORDER — FULVESTRANT 250 MG/5ML IM SOLN
500.0000 mg | INTRAMUSCULAR | Status: DC
  Administered 2015-10-14: 500 mg via INTRAMUSCULAR
  Filled 2015-10-14: qty 10

## 2015-10-14 NOTE — Patient Instructions (Signed)

## 2015-11-11 ENCOUNTER — Ambulatory Visit (HOSPITAL_BASED_OUTPATIENT_CLINIC_OR_DEPARTMENT_OTHER): Payer: BLUE CROSS/BLUE SHIELD

## 2015-11-11 ENCOUNTER — Other Ambulatory Visit (HOSPITAL_BASED_OUTPATIENT_CLINIC_OR_DEPARTMENT_OTHER): Payer: BLUE CROSS/BLUE SHIELD

## 2015-11-11 ENCOUNTER — Ambulatory Visit (HOSPITAL_BASED_OUTPATIENT_CLINIC_OR_DEPARTMENT_OTHER): Payer: BLUE CROSS/BLUE SHIELD | Admitting: Oncology

## 2015-11-11 VITALS — BP 130/91 | HR 87 | Temp 98.6°F | Resp 18 | Ht 67.0 in | Wt 216.9 lb

## 2015-11-11 DIAGNOSIS — F329 Major depressive disorder, single episode, unspecified: Secondary | ICD-10-CM | POA: Diagnosis not present

## 2015-11-11 DIAGNOSIS — Z5111 Encounter for antineoplastic chemotherapy: Secondary | ICD-10-CM | POA: Diagnosis not present

## 2015-11-11 DIAGNOSIS — C50411 Malignant neoplasm of upper-outer quadrant of right female breast: Secondary | ICD-10-CM

## 2015-11-11 DIAGNOSIS — M8588 Other specified disorders of bone density and structure, other site: Secondary | ICD-10-CM | POA: Diagnosis not present

## 2015-11-11 DIAGNOSIS — F411 Generalized anxiety disorder: Secondary | ICD-10-CM | POA: Diagnosis not present

## 2015-11-11 DIAGNOSIS — M8580 Other specified disorders of bone density and structure, unspecified site: Secondary | ICD-10-CM

## 2015-11-11 LAB — CBC WITH DIFFERENTIAL/PLATELET
BASO%: 0.9 % (ref 0.0–2.0)
Basophils Absolute: 0 10*3/uL (ref 0.0–0.1)
EOS ABS: 0.2 10*3/uL (ref 0.0–0.5)
EOS%: 4.8 % (ref 0.0–7.0)
HCT: 40.1 % (ref 34.8–46.6)
HEMOGLOBIN: 13.4 g/dL (ref 11.6–15.9)
LYMPH%: 31.8 % (ref 14.0–49.7)
MCH: 29.8 pg (ref 25.1–34.0)
MCHC: 33.4 g/dL (ref 31.5–36.0)
MCV: 89.3 fL (ref 79.5–101.0)
MONO#: 0.4 10*3/uL (ref 0.1–0.9)
MONO%: 9.8 % (ref 0.0–14.0)
NEUT%: 52.7 % (ref 38.4–76.8)
NEUTROS ABS: 2.3 10*3/uL (ref 1.5–6.5)
Platelets: 281 10*3/uL (ref 145–400)
RBC: 4.49 10*6/uL (ref 3.70–5.45)
RDW: 13.2 % (ref 11.2–14.5)
WBC: 4.4 10*3/uL (ref 3.9–10.3)
lymph#: 1.4 10*3/uL (ref 0.9–3.3)

## 2015-11-11 LAB — COMPREHENSIVE METABOLIC PANEL
ALBUMIN: 3.9 g/dL (ref 3.5–5.0)
ALK PHOS: 83 U/L (ref 40–150)
ALT: 35 U/L (ref 0–55)
AST: 24 U/L (ref 5–34)
Anion Gap: 8 mEq/L (ref 3–11)
BILIRUBIN TOTAL: 0.48 mg/dL (ref 0.20–1.20)
BUN: 12 mg/dL (ref 7.0–26.0)
CO2: 27 meq/L (ref 22–29)
CREATININE: 0.8 mg/dL (ref 0.6–1.1)
Calcium: 9.8 mg/dL (ref 8.4–10.4)
Chloride: 105 mEq/L (ref 98–109)
EGFR: 89 mL/min/{1.73_m2} — AB (ref >90)
GLUCOSE: 92 mg/dL (ref 70–140)
Potassium: 4.5 mEq/L (ref 3.5–5.1)
SODIUM: 140 meq/L (ref 136–145)
TOTAL PROTEIN: 7.5 g/dL (ref 6.4–8.3)

## 2015-11-11 MED ORDER — FULVESTRANT 250 MG/5ML IM SOLN
500.0000 mg | INTRAMUSCULAR | Status: DC
  Administered 2015-11-11: 500 mg via INTRAMUSCULAR
  Filled 2015-11-11: qty 10

## 2015-11-11 NOTE — Progress Notes (Signed)
[place]  [phone] Fax:([phone]) [phone]     ID: Elizabeth Miles DOB: October 26, 1965  MR#: [id number]  QQP#:[id number]  Patient Care Team: Clinton Quant, MD as PCP - General (Internal Medicine) Clinton Quant, MD as Referring Physician (Internal Medicine) Rolm Bookbinder, MD as Consulting Physician (General Surgery) Chauncey Cruel, MD as Consulting Physician (Oncology) Thea Silversmith, MD as Consulting Physician (Radiation Oncology) GYN: Servando Salina MD  CHIEF COMPLAINT: right breat cancer  CURRENT TREATMENT: letrozole; denosumab pending  BREAST CANCER HISTORY: From Dr. Dana Allan earlier notes:  "Elizabeth Miles is a 50 y.o. female. Without significant past medical history who on June 2013 had a mammogram that was normal. But there was on physical exam possibility of a cyst noted in the right breast. The mammogram was negative. In 2014 June patient noted on exam another lump in the right breast. She underwent a diagnostic mammogram on June 10 that showed a right breast nodule in the outer quadrant. She had an ultrasound performed that showed at the 9:30 o'clock position a 3.6 cm area and then added 10:00 position 1.3 cm area with a total area being anywhere between 5-6 cm. The patient went on to have a right breast biopsy performed in [place]. The pathology revealed an invasive ductal carcinoma. This is been confirmed by our pathology as well. The carcinoma and papillary features and was felt to be between a grade 1 and 2. The tumor was estrogen receptor positive strongly (100%) progesterone receptor negative HER-2/neu negative with a Ki-67 that showed a high proliferation rate. Patient is now seen in medical oncology for discussion of treatment options."  Bilateral breast MRI on 08/27/2012 revealed in the right upper quadrant irregular lobulated mass with a satellite nodule or lobulation within 2 mm at its superior aspect, measured together as  4.5 x 4.0 x 3.8 cm. There was extension of enhancement to the nipple, suggesting nipple involvement may be present. No lymphadenopathy was noted there was no any other area of abnormal enhancement in either breast (clinical stage IIA, T2 N0).  PET scan performed on 08/29/2012 revealed the primary breast cancer measuring 3.2 x 3.3 cm with SUV of 16. It was adjacent nodule along the superiomedial border of the primary mass measuring 1.4 cm which was also hypermetabolic. There were no additional areas of abnormal hypermetabolism. No abnormal hypermetabolic activity was seen in the chest, abdomen/pelvis,within the liver, pancreas, adrenal glands or spleen. No hypermetabolic lymph nodes. In the skeleton, no focal hypermetabolic activity to suggest skeletal metastasis was seen.  Completed neoadjuvant chemotherapy consisting of Q14 day Adriamycin/Cytoxan x 4 cycles on 10/25/2012, followed by one dose of neoadjuvant Taxol on 11/08/2012. She developed significant grade 2 neuropathy and Taxol was discontinued. Then received neoadjuvant chemotherapy consisting of single agent Abraxane given on day 1, 8, 15 of each 28 day cycle. She completed therapy on 11/15/12 - 02/14/13.   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 Repeat HER-2 testing was now positive. The patient was started on tamoxifen March 2015 and on Herceptin the same month.  Her subsequent history is as detailed below.  INTERVAL HISTORY: Elizabeth Miles returns today for follow-up of her estrogen receptor positive breast cancer. She is receiving Faslodex every 28 days and tolerating that well. She has dental clearance now to start the Prolia.  REVIEW OF SYSTEMS: Elizabeth Miles hurts all over,  but particularly in her feet and her left knee this is not improved or worsened with activity. It does come and go. She is  having some jaw pain as well. She was evaluated here by our acute intake nurse practitioner, and she had some films in [place] which did not show any significant pathology. She is tired all the time, has vivid dreams in which he runs all night sweats as she wakes up exhausted. She is taking Tylenol and Motrin for her bony aches and pains. She is concerned that her mother and father both have been diagnosed with cancer within the past year. A detailed review of systems today was otherwise stable   PAST MEDICAL HISTORY: Past Medical History:  Diagnosis Date  . Allergy   . Anxiety   . Breast cancer ([place]) 08/06/12   invasive ductal carcioma  . Complication of anesthesia    1986 ; problem waking up  . GERD (gastroesophageal reflux disease)   . GERD (gastroesophageal reflux disease) 08/22/2012  . Wears glasses     PAST SURGICAL HISTORY: Past Surgical History:  Procedure Laterality Date  . BREAST BIOPSY Right 08/06/12  . BREAST LUMPECTOMY WITH NEEDLE LOCALIZATION AND AXILLARY SENTINEL LYMPH NODE BX Right 03/10/2013   Procedure: BREAST LUMPECTOMY WITH NEEDLE LOCALIZATION AND AXILLARY SENTINEL LYMPH NODE BIOPSY;  Surgeon: Rolm Bookbinder, MD;  Location: [facility];  Service: General;  Laterality: Right;  . [place]  . DILATION AND CURETTAGE OF UTERUS  1993  . POPLITEAL SYNOVIAL CYST EXCISION  1970  . PORT-A-CATH REMOVAL Left 03/10/2013   Procedure: REMOVAL PORT-A-CATH;  Surgeon: Rolm Bookbinder, MD;  Location: [facility];  Service: General;  Laterality: Left;  . PORTACATH PLACEMENT Left 09/12/2012   Procedure: INSERTION PORT-A-CATH;  Surgeon: Rolm Bookbinder, MD;  Location: [facility] OR;  Service: General;  Laterality: Left;    FAMILY HISTORY Family History  Problem Relation Age of Onset  . Hypertension Mother   . Bladder Cancer Maternal Uncle 68  . Cancer Paternal Grandmother 9    lung cancer  . Lung cancer Paternal Grandfather     dx in his  98s  . COPD Paternal Uncle    the patient's parents are living and in good health. The patient has one brother, no sisters. There is no history of breast or ovarian cancer in the family to her knowledge  GYNECOLOGIC HISTORY:  Patient's last menstrual period was 03/12/2012. Menarche age 59, first live birth age 82. The patient is GX P3. 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 Her husband, Elizabeth Miles, is a Tour manager. A daughter, Elizabeth Miles, 65, lives in [place] and daughter Elizabeth Miles just had a baby girl, the patient's first grandchild. Son Elizabeth Miles "Carlynn Spry" is 48 and at home. The patient is a Psychologist, forensic.    ADVANCED DIRECTIVES: Not in place   HEALTH MAINTENANCE: Social History  Substance Use Topics  . Smoking status: Never Smoker  . Smokeless tobacco: Never Used  . Alcohol use No     Colonoscopy:  PAP:  Bone density:  Lipid panel:  Allergies  Allergen Reactions  . Anastrozole Anaphylaxis  . Darvocet [Propoxyphene N-Acetaminophen] Shortness Of Breath and Swelling  . Shellfish Allergy Shortness Of Breath and Swelling  . Other     CT dye, "chest hurt" "makes me feel cold"    Current Outpatient Prescriptions  Medication  Sig Dispense Refill  . acetaminophen (TYLENOL) 500 MG tablet Take 1,000 mg by mouth every 6 (six) hours as needed. Reported on 06/14/2015    . aspirin 325 MG tablet Take 325 mg by mouth daily.    . cetirizine (ZYRTEC) 10 MG tablet Take 10 mg by mouth daily.    . [employer] D3 1000 units capsule TAKE ONE CAPSULE BY MOUTH EVERY DAY 90 capsule 3  . Dexlansoprazole (DEXILANT) 30 MG capsule Take 1 capsule (30 mg total) by mouth daily. 90 capsule 4  . ibuprofen (ADVIL,MOTRIN) 800 MG tablet Take 800 mg by mouth every 8 (eight) hours as needed for moderate pain (for back pain). Reported on 06/14/2015    . metroNIDAZOLE (METROGEL) 1  % gel Apply topically daily. 45 g 0  . venlafaxine XR (EFFEXOR-XR) 37.5 MG 24 hr capsule Take 1 capsule (37.5 mg total) by mouth daily with breakfast. 90 capsule 4  . venlafaxine XR (EFFEXOR-XR) 75 MG 24 hr capsule Take 1 capsule (75 mg total) by mouth daily with breakfast. 30 capsule 12   No current facility-administered medications for this visit.     OBJECTIVE: Middle-aged white woman Who appears stated age 2:   11/11/15 1002  BP: (!) 130/91  Pulse: 87  Resp: 18  Temp: 98.6 F (37 C)     Body mass index is 33.97 kg/m.    ECOG FS:1 - Symptomatic but completely ambulatory  Sclerae unicteric, EOMs intact Oropharynx clear and moist No cervical or supraclavicular adenopathy Lungs no rales or rhonchi Heart regular rate and rhythm Abd soft, nontender, positive bowel sounds MSK no focal spinal tenderness, no upper extremity lymphedema Neuro: nonfocal, well oriented, appropriate affect Breasts: The right breast is status post lumpectomy and radiation. There is no evidence of local recurrence. The right axilla is benign. The left breast is unremarkable   LAB RESULTS:  CMP     Component Value Date/Time   NA 142 09/27/2015 1501   K 3.5 09/27/2015 1501   CL 105 08/17/2013 0346   CO2 25 09/27/2015 1501   GLUCOSE 120 09/27/2015 1501   BUN 8.1 09/27/2015 1501   CREATININE 0.8 09/27/2015 1501   CALCIUM 9.7 09/27/2015 1501   PROT 7.7 09/27/2015 1501   ALBUMIN 3.8 09/27/2015 1501   AST 28 09/27/2015 1501   ALT 32 09/27/2015 1501   ALKPHOS 95 09/27/2015 1501   BILITOT 0.61 09/27/2015 1501    I No results found for: SPEP  Lab Results  Component Value Date   WBC 4.4 11/11/2015   NEUTROABS 2.3 11/11/2015   HGB 13.4 11/11/2015   HCT 40.1 11/11/2015   MCV 89.3 11/11/2015   PLT 281 11/11/2015      Chemistry      Component Value Date/Time   NA 142 09/27/2015 1501   K 3.5 09/27/2015 1501   CL 105 08/17/2013 0346   CO2 25 09/27/2015 1501   BUN 8.1 09/27/2015 1501    CREATININE 0.8 09/27/2015 1501      Component Value Date/Time   CALCIUM 9.7 09/27/2015 1501   ALKPHOS 95 09/27/2015 1501   AST 28 09/27/2015 1501   ALT 32 09/27/2015 1501   BILITOT 0.61 09/27/2015 1501       No results found for: LABCA2  No components found for: LABCA125  No results for input(s): INR in the last 168 hours.  Urinalysis    Component Value Date/Time   LABSPEC 1.020 09/28/2015 1224   PHURINE 5.0 09/28/2015 1224   GLUCOSEU Negative  09/28/2015 1224   HGBUR Negative 09/28/2015 1224   BILIRUBINUR Negative 09/28/2015 1224   KETONESUR Negative 09/28/2015 1224   PROTEINUR Negative 09/28/2015 1224   UROBILINOGEN 0.2 09/28/2015 1224   NITRITE Negative 09/28/2015 1224   LEUKOCYTESUR Negative 09/28/2015 1224    STUDIES:  Study Result   CLINICAL DATA:  Anterior and medial left knee pain beginning last week. History of breast cancer.  EXAM: LEFT KNEE - COMPLETE 4+ VIEW  COMPARISON:  09/25/2014 radiographs of the left tibia and fibula  FINDINGS: There is no evidence of acute fracture, dislocation, or knee joint effusion. No destructive osseous lesion is seen. Femorotibial joint space widths are preserved. No focal soft tissue abnormality is seen.  IMPRESSION: Negative.   Electronically Signed   By: Logan Bores M.D.   On: 09/28/2015 12:37     ASSESSMENT: 50 y.o. [place] woman  (1) status post right breast upper outer quadrant biopsy 08/06/2012 for a clinical mT2 N0, stage IIA invasive ductal carcinoma, estrogen receptor 3+ positive, progesterone receptor and HER-2 negative, Ki67 3+ [[id number]]  (2) treated neoadjuvantly with dose dense cyclophosphamide and doxorubicin x4, completed 10/25/2012, followed by a single dose of weekly paclitaxel, poorly tolerated; followed by 11 doses of Abraxane completed 02/14/2013  (3) status post right lumpectomy and sentinel lymph node sampling 03/10/2013 for a ypT2 pN0, stage IIA invasive ductal  carcinoma, grade 2, estrogen receptor 100% positive, progesterone receptor 21% positive, with an MIB-1 of 14%, and HER-2 amplified, the signals ratio being 2.52, the number per cell 2.90  (4) adjuvant radiation completed March 2015 (?)  (5) started tamoxifen March 2015, stopped 05/25/14 because of superficial clot to left cephalic vein; began anastrozole on 05/29/14, discontinued after 2 weeks with rash; started letrozole 05/24/2015, discontinued after 5 days use because of angioedema-like symptoms  (6) started trastuzumab 05/23/2013, completed 05/15/14  (7) osteopenia: With T score of -1.7 on bone density scan at [facility] 06/10/2014.   (a) has dental clearance to start denosumab/Prolia  (8) fulvestrant started 06/23/2015  PLAN: I spent approximately 30 minutes with teary today going over her complex situation. She wonders if she should have her uterus removed since her mother had cancer of the uterus. She wonders if she is at risk for ovarian cancer and should have her ovaries removed. She wonders if the pain in her left knee is cancer of the bone. In short she is exceedingly anxious. She is depressed, fatigue, and not able to do very much.  All these problems do impinge on her treatment. I think this is why it has been so difficult for [place] to find something she can tolerate. Thankfully she is tolerating the fulvestrant well and the plan is to continue that for a total of 5 years.  We have dental clearance to start the Prolia. On the other hand I am concerned that she already has bony pains and this is going to make the bony pains worse. I offered her an orthopedic evaluation but she does not want that at this point.  We ended up deciding that she will try the Prolia with the October fulvestrant dose. We are holding off on the orthopedic evaluation and till she gives me the go ahead.  I think he would be helpful if she could meet with our navigators or social workers but is very hard for her to come  more than 4 just her treatments.  She will see me again in December. She knows to call for any problems that may  develop before her next visit here.  Chauncey Cruel, MD   11/11/2015 10:24 AM

## 2015-11-11 NOTE — Patient Instructions (Signed)

## 2015-12-09 ENCOUNTER — Other Ambulatory Visit (HOSPITAL_BASED_OUTPATIENT_CLINIC_OR_DEPARTMENT_OTHER): Payer: BLUE CROSS/BLUE SHIELD

## 2015-12-09 ENCOUNTER — Ambulatory Visit (HOSPITAL_BASED_OUTPATIENT_CLINIC_OR_DEPARTMENT_OTHER): Payer: BLUE CROSS/BLUE SHIELD

## 2015-12-09 VITALS — BP 136/86 | HR 95 | Temp 98.3°F | Resp 18

## 2015-12-09 DIAGNOSIS — Z5111 Encounter for antineoplastic chemotherapy: Secondary | ICD-10-CM | POA: Diagnosis not present

## 2015-12-09 DIAGNOSIS — M8588 Other specified disorders of bone density and structure, other site: Secondary | ICD-10-CM

## 2015-12-09 DIAGNOSIS — C50411 Malignant neoplasm of upper-outer quadrant of right female breast: Secondary | ICD-10-CM | POA: Diagnosis not present

## 2015-12-09 DIAGNOSIS — M8580 Other specified disorders of bone density and structure, unspecified site: Secondary | ICD-10-CM

## 2015-12-09 LAB — CBC WITH DIFFERENTIAL/PLATELET
BASO%: 1 % (ref 0.0–2.0)
Basophils Absolute: 0.1 10*3/uL (ref 0.0–0.1)
EOS ABS: 0.2 10*3/uL (ref 0.0–0.5)
EOS%: 4.2 % (ref 0.0–7.0)
HEMATOCRIT: 39.6 % (ref 34.8–46.6)
HGB: 13.2 g/dL (ref 11.6–15.9)
LYMPH%: 25.3 % (ref 14.0–49.7)
MCH: 29.7 pg (ref 25.1–34.0)
MCHC: 33.3 g/dL (ref 31.5–36.0)
MCV: 89 fL (ref 79.5–101.0)
MONO#: 0.5 10*3/uL (ref 0.1–0.9)
MONO%: 8.6 % (ref 0.0–14.0)
NEUT%: 60.9 % (ref 38.4–76.8)
NEUTROS ABS: 3.2 10*3/uL (ref 1.5–6.5)
PLATELETS: 278 10*3/uL (ref 145–400)
RBC: 4.45 10*6/uL (ref 3.70–5.45)
RDW: 13.2 % (ref 11.2–14.5)
WBC: 5.3 10*3/uL (ref 3.9–10.3)
lymph#: 1.3 10*3/uL (ref 0.9–3.3)
nRBC: 0 % (ref 0–0)

## 2015-12-09 MED ORDER — FULVESTRANT 250 MG/5ML IM SOLN
500.0000 mg | INTRAMUSCULAR | Status: DC
  Administered 2015-12-09: 500 mg via INTRAMUSCULAR
  Filled 2015-12-09: qty 10

## 2015-12-09 MED ORDER — DENOSUMAB 60 MG/ML ~~LOC~~ SOLN
60.0000 mg | Freq: Once | SUBCUTANEOUS | Status: AC
  Administered 2015-12-09: 60 mg via SUBCUTANEOUS
  Filled 2015-12-09: qty 1

## 2015-12-09 NOTE — Patient Instructions (Signed)
Denosumab injection What is this medicine? DENOSUMAB (den oh sue mab) slows bone breakdown. Prolia is used to treat osteoporosis in women after menopause and in men. Xgeva is used to prevent bone fractures and other bone problems caused by cancer bone metastases. Xgeva is also used to treat giant cell tumor of the bone. This medicine may be used for other purposes; ask your health care provider or pharmacist if you have questions. What should I tell my health care provider before I take this medicine? They need to know if you have any of these conditions: -dental disease -eczema -infection or history of infections -kidney disease or on dialysis -low blood calcium or vitamin D -malabsorption syndrome -scheduled to have surgery or tooth extraction -taking medicine that contains denosumab -thyroid or parathyroid disease -an unusual reaction to denosumab, other medicines, foods, dyes, or preservatives -pregnant or trying to get pregnant -breast-feeding How should I use this medicine? This medicine is for injection under the skin. It is given by a health care professional in a hospital or clinic setting. If you are getting Prolia, a special MedGuide will be given to you by the pharmacist with each prescription and refill. Be sure to read this information carefully each time. For Prolia, talk to your pediatrician regarding the use of this medicine in children. Special care may be needed. For Xgeva, talk to your pediatrician regarding the use of this medicine in children. While this drug may be prescribed for children as young as 13 years for selected conditions, precautions do apply. Overdosage: If you think you have taken too much of this medicine contact a poison control center or emergency room at once. NOTE: This medicine is only for you. Do not share this medicine with others. What if I miss a dose? It is important not to miss your dose. Call your doctor or health care professional if you are  unable to keep an appointment. What may interact with this medicine? Do not take this medicine with any of the following medications: -other medicines containing denosumab This medicine may also interact with the following medications: -medicines that suppress the immune system -medicines that treat cancer -steroid medicines like prednisone or cortisone This list may not describe all possible interactions. Give your health care provider a list of all the medicines, herbs, non-prescription drugs, or dietary supplements you use. Also tell them if you smoke, drink alcohol, or use illegal drugs. Some items may interact with your medicine. What should I watch for while using this medicine? Visit your doctor or health care professional for regular checks on your progress. Your doctor or health care professional may order blood tests and other tests to see how you are doing. Call your doctor or health care professional if you get a cold or other infection while receiving this medicine. Do not treat yourself. This medicine may decrease your body's ability to fight infection. You should make sure you get enough calcium and vitamin D while you are taking this medicine, unless your doctor tells you not to. Discuss the foods you eat and the vitamins you take with your health care professional. See your dentist regularly. Brush and floss your teeth as directed. Before you have any dental work done, tell your dentist you are receiving this medicine. Do not become pregnant while taking this medicine or for 5 months after stopping it. Women should inform their doctor if they wish to become pregnant or think they might be pregnant. There is a potential for serious side effects   to an unborn child. Talk to your health care professional or pharmacist for more information. What side effects may I notice from receiving this medicine? Side effects that you should report to your doctor or health care professional as soon as  possible: -allergic reactions like skin rash, itching or hives, swelling of the face, lips, or tongue -breathing problems -chest pain -fast, irregular heartbeat -feeling faint or lightheaded, falls -fever, chills, or any other sign of infection -muscle spasms, tightening, or twitches -numbness or tingling -skin blisters or bumps, or is dry, peels, or red -slow healing or unexplained pain in the mouth or jaw -unusual bleeding or bruising Side effects that usually do not require medical attention (Report these to your doctor or health care professional if they continue or are bothersome.): -muscle pain -stomach upset, gas This list may not describe all possible side effects. Call your doctor for medical advice about side effects. You may report side effects to FDA at 1-800-FDA-1088. Where should I keep my medicine? This medicine is only given in a clinic, doctor's office, or other health care setting and will not be stored at home. NOTE: This sheet is a summary. It may not cover all possible information. If you have questions about this medicine, talk to your doctor, pharmacist, or health care provider.    2016, Elsevier/Gold Standard. (2011-08-14 12:37:47) Fulvestrant injection What is this medicine? FULVESTRANT (ful VES trant) blocks the effects of estrogen. It is used to treat breast cancer. This medicine may be used for other purposes; ask your health care provider or pharmacist if you have questions. What should I tell my health care provider before I take this medicine? They need to know if you have any of these conditions: -bleeding problems -liver disease -low levels of platelets in the blood -an unusual or allergic reaction to fulvestrant, other medicines, foods, dyes, or preservatives -pregnant or trying to get pregnant -breast-feeding How should I use this medicine? This medicine is for injection into a muscle. It is usually given by a health care professional in a  hospital or clinic setting. Talk to your pediatrician regarding the use of this medicine in children. Special care may be needed. Overdosage: If you think you have taken too much of this medicine contact a poison control center or emergency room at once. NOTE: This medicine is only for you. Do not share this medicine with others. What if I miss a dose? It is important not to miss your dose. Call your doctor or health care professional if you are unable to keep an appointment. What may interact with this medicine? -medicines that treat or prevent blood clots like warfarin, enoxaparin, and dalteparin This list may not describe all possible interactions. Give your health care provider a list of all the medicines, herbs, non-prescription drugs, or dietary supplements you use. Also tell them if you smoke, drink alcohol, or use illegal drugs. Some items may interact with your medicine. What should I watch for while using this medicine? Your condition will be monitored carefully while you are receiving this medicine. You will need important blood work done while you are taking this medicine. Do not become pregnant while taking this medicine or for at least 1 year after stopping it. Women of child-bearing potential will need to have a negative pregnancy test before starting this medicine. Women should inform their doctor if they wish to become pregnant or think they might be pregnant. There is a potential for serious side effects to an unborn child. Men   should inform their doctors if they wish to father a child. This medicine may lower sperm counts. Talk to your health care professional or pharmacist for more information. Do not breast-feed an infant while taking this medicine or for 1 year after the last dose. What side effects may I notice from receiving this medicine? Side effects that you should report to your doctor or health care professional as soon as possible: -allergic reactions like skin rash,  itching or hives, swelling of the face, lips, or tongue -feeling faint or lightheaded, falls -pain, tingling, numbness, or weakness in the legs -signs and symptoms of infection like fever or chills; cough; flu-like symptoms; sore throat -vaginal bleeding Side effects that usually do not require medical attention (report to your doctor or health care professional if they continue or are bothersome): -aches, pains -constipation -diarrhea -headache -hot flashes -nausea, vomiting -pain at site where injected -stomach pain This list may not describe all possible side effects. Call your doctor for medical advice about side effects. You may report side effects to FDA at 1-800-FDA-1088. Where should I keep my medicine? This drug is given in a hospital or clinic and will not be stored at home. NOTE: This sheet is a summary. It may not cover all possible information. If you have questions about this medicine, talk to your doctor, pharmacist, or health care provider.    2016, Elsevier/Gold Standard. (2014-09-11 11:03:55)  

## 2015-12-12 ENCOUNTER — Other Ambulatory Visit: Payer: Self-pay | Admitting: Oncology

## 2015-12-28 ENCOUNTER — Other Ambulatory Visit: Payer: Self-pay | Admitting: Oncology

## 2016-01-06 ENCOUNTER — Other Ambulatory Visit (HOSPITAL_BASED_OUTPATIENT_CLINIC_OR_DEPARTMENT_OTHER): Payer: BLUE CROSS/BLUE SHIELD

## 2016-01-06 ENCOUNTER — Ambulatory Visit (HOSPITAL_BASED_OUTPATIENT_CLINIC_OR_DEPARTMENT_OTHER): Payer: BLUE CROSS/BLUE SHIELD | Admitting: Nurse Practitioner

## 2016-01-06 VITALS — BP 134/86 | HR 92 | Temp 98.6°F | Resp 16

## 2016-01-06 DIAGNOSIS — M8580 Other specified disorders of bone density and structure, unspecified site: Secondary | ICD-10-CM

## 2016-01-06 DIAGNOSIS — C50411 Malignant neoplasm of upper-outer quadrant of right female breast: Secondary | ICD-10-CM

## 2016-01-06 DIAGNOSIS — Z5111 Encounter for antineoplastic chemotherapy: Secondary | ICD-10-CM | POA: Diagnosis not present

## 2016-01-06 LAB — COMPREHENSIVE METABOLIC PANEL
ALT: 27 U/L (ref 0–55)
AST: 21 U/L (ref 5–34)
Albumin: 3.7 g/dL (ref 3.5–5.0)
Alkaline Phosphatase: 105 U/L (ref 40–150)
Anion Gap: 8 mEq/L (ref 3–11)
BUN: 9.3 mg/dL (ref 7.0–26.0)
CALCIUM: 9.7 mg/dL (ref 8.4–10.4)
CHLORIDE: 105 meq/L (ref 98–109)
CO2: 26 mEq/L (ref 22–29)
Creatinine: 0.8 mg/dL (ref 0.6–1.1)
EGFR: >90 mL/min/{1.73_m2} (ref >90)
Glucose: 102 mg/dl (ref 70–140)
POTASSIUM: 4.6 meq/L (ref 3.5–5.1)
SODIUM: 139 meq/L (ref 136–145)
Total Bilirubin: 0.35 mg/dL (ref 0.20–1.20)
Total Protein: 7.6 g/dL (ref 6.4–8.3)

## 2016-01-06 LAB — CBC WITH DIFFERENTIAL/PLATELET
BASO%: 0.7 % (ref 0.0–2.0)
BASOS ABS: 0 10*3/uL (ref 0.0–0.1)
EOS%: 4 % (ref 0.0–7.0)
Eosinophils Absolute: 0.2 10*3/uL (ref 0.0–0.5)
HEMATOCRIT: 40 % (ref 34.8–46.6)
HGB: 13.3 g/dL (ref 11.6–15.9)
LYMPH%: 33.9 % (ref 14.0–49.7)
MCH: 30.1 pg (ref 25.1–34.0)
MCHC: 33.3 g/dL (ref 31.5–36.0)
MCV: 90.5 fL (ref 79.5–101.0)
MONO#: 0.4 10*3/uL (ref 0.1–0.9)
MONO%: 8.4 % (ref 0.0–14.0)
NEUT#: 2.4 10*3/uL (ref 1.5–6.5)
NEUT%: 53 % (ref 38.4–76.8)
Platelets: 309 10*3/uL (ref 145–400)
RBC: 4.42 10*6/uL (ref 3.70–5.45)
RDW: 13.2 % (ref 11.2–14.5)
WBC: 4.5 10*3/uL (ref 3.9–10.3)
lymph#: 1.5 10*3/uL (ref 0.9–3.3)

## 2016-01-06 MED ORDER — FULVESTRANT 250 MG/5ML IM SOLN
500.0000 mg | INTRAMUSCULAR | Status: DC
  Administered 2016-01-06 (×2): 250 mg via INTRAMUSCULAR
  Filled 2016-01-06: qty 10

## 2016-01-06 NOTE — Patient Instructions (Signed)

## 2016-01-07 ENCOUNTER — Other Ambulatory Visit: Payer: Self-pay | Admitting: *Deleted

## 2016-01-07 MED ORDER — VENLAFAXINE HCL ER 75 MG PO CP24: 75.0000 mg | ORAL_CAPSULE | Freq: Every day | ORAL | 4 refills | Status: DC

## 2016-02-03 ENCOUNTER — Ambulatory Visit (HOSPITAL_BASED_OUTPATIENT_CLINIC_OR_DEPARTMENT_OTHER): Payer: BLUE CROSS/BLUE SHIELD | Admitting: Oncology

## 2016-02-03 ENCOUNTER — Ambulatory Visit (HOSPITAL_BASED_OUTPATIENT_CLINIC_OR_DEPARTMENT_OTHER): Payer: BLUE CROSS/BLUE SHIELD

## 2016-02-03 ENCOUNTER — Other Ambulatory Visit (HOSPITAL_BASED_OUTPATIENT_CLINIC_OR_DEPARTMENT_OTHER): Payer: BLUE CROSS/BLUE SHIELD

## 2016-02-03 VITALS — BP 126/63 | HR 90 | Temp 98.3°F | Resp 18 | Ht 67.0 in | Wt 219.7 lb

## 2016-02-03 DIAGNOSIS — C50411 Malignant neoplasm of upper-outer quadrant of right female breast: Secondary | ICD-10-CM

## 2016-02-03 DIAGNOSIS — Z5111 Encounter for antineoplastic chemotherapy: Secondary | ICD-10-CM

## 2016-02-03 DIAGNOSIS — M8588 Other specified disorders of bone density and structure, other site: Secondary | ICD-10-CM | POA: Diagnosis not present

## 2016-02-03 DIAGNOSIS — Z170 Estrogen receptor positive status [ER+]: Secondary | ICD-10-CM

## 2016-02-03 DIAGNOSIS — M8580 Other specified disorders of bone density and structure, unspecified site: Secondary | ICD-10-CM

## 2016-02-03 LAB — CBC WITH DIFFERENTIAL/PLATELET
BASO%: 1.1 % (ref 0.0–2.0)
Basophils Absolute: 0.1 10*3/uL (ref 0.0–0.1)
EOS ABS: 0.2 10*3/uL (ref 0.0–0.5)
EOS%: 4.9 % (ref 0.0–7.0)
HEMATOCRIT: 42.1 % (ref 34.8–46.6)
HGB: 14 g/dL (ref 11.6–15.9)
LYMPH#: 1.4 10*3/uL (ref 0.9–3.3)
LYMPH%: 31.6 % (ref 14.0–49.7)
MCH: 30 pg (ref 25.1–34.0)
MCHC: 33.3 g/dL (ref 31.5–36.0)
MCV: 90.1 fL (ref 79.5–101.0)
MONO#: 0.4 10*3/uL (ref 0.1–0.9)
MONO%: 8.4 % (ref 0.0–14.0)
NEUT%: 54 % (ref 38.4–76.8)
NEUTROS ABS: 2.5 10*3/uL (ref 1.5–6.5)
NRBC: 0 % (ref 0–0)
PLATELETS: 303 10*3/uL (ref 145–400)
RBC: 4.67 10*6/uL (ref 3.70–5.45)
RDW: 13.3 % (ref 11.2–14.5)
WBC: 4.5 10*3/uL (ref 3.9–10.3)

## 2016-02-03 MED ORDER — FULVESTRANT 250 MG/5ML IM SOLN
500.0000 mg | INTRAMUSCULAR | Status: DC
  Administered 2016-02-03: 500 mg via INTRAMUSCULAR
  Filled 2016-02-03: qty 10

## 2016-02-03 NOTE — Patient Instructions (Signed)

## 2016-02-03 NOTE — Progress Notes (Signed)
[facility]  Telephone:([phone]) [phone] [phone]) [phone]     ID: Elizabeth Miles DOB: [birth year]  MR#: [id number]  JXN#:[id number]  Patient Care Team: Eldridge Abrahams, MD as PCP - General (Internal Medicine) Eldridge Abrahams, MD as Referring Physician (Internal Medicine) Emelia Loron, MD as Consulting Physician (General Surgery) Lowella Dell, MD as Consulting Physician (Oncology) Lurline Hare, MD as Consulting Physician (Radiation Oncology) GYN: Maxie Better MD  CHIEF COMPLAINT: right breat cancer  CURRENT TREATMENT: Fulvestrant, denosumab/Prolia  BREAST CANCER HISTORY: From Dr. Feliz Beam earlier notes:  "Elizabeth Miles is a 50 y.o. female. Without significant past medical history who on June 2013 had a mammogram that was normal. But there was on physical exam possibility of a cyst noted in the right breast. The mammogram was negative. In 2014 June patient noted on exam another lump in the right breast. She underwent a diagnostic mammogram on June 10 that showed a right breast nodule in the outer quadrant. She had an ultrasound performed that showed at the 9:30 o'clock position a 3.6 cm area and then added 10:00 position 1.3 cm area with a total area being anywhere between 5-6 cm. The patient went on to have a right breast biopsy performed in [place]. The pathology revealed an invasive ductal carcinoma. This is been confirmed by our pathology as well. The carcinoma and papillary features and was felt to be between a grade 1 and 2. The tumor was estrogen receptor positive strongly (100%) progesterone receptor negative HER-2/neu negative with a Ki-67 that showed a high proliferation rate. Patient is now seen in medical oncology for discussion of treatment options."  Bilateral breast MRI on 08/27/2012 revealed in the right upper quadrant irregular lobulated mass with a satellite nodule or lobulation within 2 mm at its superior aspect, measured together as  4.5 x 4.0 x 3.8 cm. There was extension of enhancement to the nipple, suggesting nipple involvement may be present. No lymphadenopathy was noted there was no any other area of abnormal enhancement in either breast (clinical stage IIA, T2 N0).  PET scan performed on 08/29/2012 revealed the primary breast cancer measuring 3.2 x 3.3 cm with SUV of 16. It was adjacent nodule along the superiomedial border of the primary mass measuring 1.4 cm which was also hypermetabolic. There were no additional areas of abnormal hypermetabolism. No abnormal hypermetabolic activity was seen in the chest, abdomen/pelvis,within the liver, pancreas, adrenal glands or spleen. No hypermetabolic lymph nodes. In the skeleton, no focal hypermetabolic activity to suggest skeletal metastasis was seen.  Completed neoadjuvant chemotherapy consisting of Q14 day Adriamycin/Cytoxan x 4 cycles on 10/25/2012, followed by one dose of neoadjuvant Taxol on 11/08/2012. She developed significant grade 2 neuropathy and Taxol was discontinued. Then received neoadjuvant chemotherapy consisting of single agent Abraxane given on day 1, 8, 15 of each 28 day cycle. She completed therapy on 11/15/12 - 02/14/13.   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 Repeat HER-2 testing was now positive. The patient was started on tamoxifen March 2015 and on Herceptin the same month.  Her subsequent history is as detailed below.  INTERVAL HISTORY: Elizabeth Miles returns today for follow-up of her estrogen receptor positive breast cancer. She continues on Faslodex, which she will again receive today. She tolerates it well, aside from the actual injection discomfort.  She also received Prolia beginning 12/09/2015. She has  had some strange symptoms including pain in her teeth and gums, not constant and not localized to  one particular area, as well as more aches and pains in her bones and joints, which she feels may be related to the Prolia.  REVIEW OF SYSTEMS: Elizabeth Miles does not feel "good". She is tired all the time. She feels unbalanced. She can't get anything down. She has nightmares. She is physically unsteady in her walk. She forgets things. Her legs ache all the time. She is concerned about gaining weight. She has occasional headaches, but no visual changes, nausea, or vomiting. A detailed review of systems was otherwise stable   PAST MEDICAL HISTORY: Past Medical History:  Diagnosis Date  . Allergy   . Anxiety   . Breast cancer ([place]) 08/06/12   invasive ductal carcioma  . Complication of anesthesia    1986 ; problem waking up  . GERD (gastroesophageal reflux disease)   . GERD (gastroesophageal reflux disease) 08/22/2012  . Wears glasses     PAST SURGICAL HISTORY: Past Surgical History:  Procedure Laterality Date  . BREAST BIOPSY Right 08/06/12  . BREAST LUMPECTOMY WITH NEEDLE LOCALIZATION AND AXILLARY SENTINEL LYMPH NODE BX Right 03/10/2013   Procedure: BREAST LUMPECTOMY WITH NEEDLE LOCALIZATION AND AXILLARY SENTINEL LYMPH NODE BIOPSY;  Surgeon: Rolm Bookbinder, MD;  Location: [facility];  Service: General;  Laterality: Right;  . [place]  . DILATION AND CURETTAGE OF UTERUS  1993  . POPLITEAL SYNOVIAL CYST EXCISION  1970  . PORT-A-CATH REMOVAL Left 03/10/2013   Procedure: REMOVAL PORT-A-CATH;  Surgeon: Rolm Bookbinder, MD;  Location: [place];  Service: General;  Laterality: Left;  . PORTACATH PLACEMENT Left 09/12/2012   Procedure: INSERTION PORT-A-CATH;  Surgeon: Rolm Bookbinder, MD;  Location: [facility];  Service: General;  Laterality: Left;    FAMILY HISTORY Family History  Problem Relation Age of Onset  . Hypertension Mother   . Bladder Cancer Maternal Uncle 68  . Cancer Paternal Grandmother 63    lung cancer  . Lung cancer Paternal  Grandfather     dx in his 64s  . COPD Paternal Uncle    the patient's parents are living and in good health. The patient has one brother, no sisters. There is no history of breast or ovarian cancer in the family to her knowledge  GYNECOLOGIC HISTORY:  Patient's last menstrual period was 03/12/2012. Menarche age 58, first live birth age 89. The patient is GX P3. 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 Her husband, Elizabeth Miles, is a Tour manager. A daughter, Elizabeth Miles,  lives in [place] and daughter Elizabeth Miles has a baby girl, the patient's first grandchild, 35 years old as of December 2017; she stays with Elizabeth Miles frequently.. Son Elizabeth Miles "Brianne" is 9 as of December 2017 and at home. The patient is a Psychologist, forensic.    ADVANCED DIRECTIVES: Not in place   HEALTH MAINTENANCE: Social History  Substance Use Topics  . Smoking status: Never Smoker  . Smokeless tobacco: Never Used  . Alcohol use No     Colonoscopy:  PAP:  Bone density:  Lipid panel:  Allergies  Allergen Reactions  . Anastrozole Anaphylaxis  . Darvocet [Propoxyphene N-Acetaminophen] Shortness Of Breath and Swelling  . Shellfish Allergy Shortness Of Breath and Swelling  . Other     CT dye, "chest hurt" "makes me feel cold"  Current Outpatient Prescriptions  Medication Sig Dispense Refill  . acetaminophen (TYLENOL) 500 MG tablet Take 1,000 mg by mouth every 6 (six) hours as needed. Reported on 06/14/2015    . aspirin 325 MG tablet Take 81 mg by mouth daily.    . cetirizine (ZYRTEC) 10 MG tablet Take 10 mg by mouth daily.    . [employer] D3 1000 units capsule TAKE ONE CAPSULE BY MOUTH EVERY DAY 90 capsule 3  . Dexlansoprazole (DEXILANT) 30 MG capsule Take 1 capsule (30 mg total) by mouth daily. 90 capsule 4  . ibuprofen (ADVIL,MOTRIN) 800 MG tablet Take 800 mg by mouth every 8 (eight)  hours as needed for moderate pain (for back pain). Reported on 06/14/2015    . metroNIDAZOLE (METROGEL) 1 % gel Apply topically daily. 45 g 0  . venlafaxine XR (EFFEXOR-XR) 75 MG 24 hr capsule TAKE 1 CAPSULE (75 MG TOTAL) BY MOUTH DAILY WITH BREAKFAST. 30 capsule 4  . venlafaxine XR (EFFEXOR-XR) 75 MG 24 hr capsule Take 1 capsule (75 mg total) by mouth daily with breakfast. 90 capsule 4   No current facility-administered medications for this visit.     OBJECTIVE: Middle-aged white woman Who appears stated age 26:   02/03/16 1029  BP: 126/63  Pulse: 90  Resp: 18  Temp: 98.3 F (36.8 C)     Body mass index is 34.41 kg/m.    ECOG FS:1 - Symptomatic but completely ambulatory  Sclerae unicteric, pupils round and equal Oropharynx clear and moist-- no thrush or other lesions No cervical or supraclavicular adenopathy Lungs no rales or rhonchi Heart regular rate and rhythm Abd soft, nontender, positive bowel sounds MSK no focal spinal tenderness, no upper extremity lymphedema Neuro: nonfocal, well oriented, appropriate affect Breasts: The right breast is status post lumpectomy and radiation. There is no evidence of local recurrence. The right axilla is benign. The left breast is unremarkable.     LAB RESULTS:  CMP     Component Value Date/Time   NA 139 01/06/2016 1112   K 4.6 01/06/2016 1112   CL 105 08/17/2013 0346   CO2 26 01/06/2016 1112   GLUCOSE 102 01/06/2016 1112   BUN 9.3 01/06/2016 1112   CREATININE 0.8 01/06/2016 1112   CALCIUM 9.7 01/06/2016 1112   PROT 7.6 01/06/2016 1112   ALBUMIN 3.7 01/06/2016 1112   AST 21 01/06/2016 1112   ALT 27 01/06/2016 1112   ALKPHOS 105 01/06/2016 1112   BILITOT 0.35 01/06/2016 1112    I No results found for: SPEP  Lab Results  Component Value Date   WBC 4.5 02/03/2016   NEUTROABS 2.5 02/03/2016   HGB 14.0 02/03/2016   HCT 42.1 02/03/2016   MCV 90.1 02/03/2016   PLT 303 02/03/2016      Chemistry      Component Value  Date/Time   NA 139 01/06/2016 1112   K 4.6 01/06/2016 1112   CL 105 08/17/2013 0346   CO2 26 01/06/2016 1112   BUN 9.3 01/06/2016 1112   CREATININE 0.8 01/06/2016 1112      Component Value Date/Time   CALCIUM 9.7 01/06/2016 1112   ALKPHOS 105 01/06/2016 1112   AST 21 01/06/2016 1112   ALT 27 01/06/2016 1112   BILITOT 0.35 01/06/2016 1112       No results found for: LABCA2  No components found for: LABCA125  No results for input(s): INR in the last 168 hours.  Urinalysis    Component Value Date/Time   LABSPEC  1.020 09/28/2015 1224   PHURINE 5.0 09/28/2015 1224   GLUCOSEU Negative 09/28/2015 1224   HGBUR Negative 09/28/2015 1224   BILIRUBINUR Negative 09/28/2015 1224   KETONESUR Negative 09/28/2015 1224   PROTEINUR Negative 09/28/2015 1224   UROBILINOGEN 0.2 09/28/2015 1224   NITRITE Negative 09/28/2015 1224   LEUKOCYTESUR Negative 09/28/2015 1224    STUDIES:  No results found.    ASSESSMENT: 50 y.o. [place] woman  (1) status post right breast upper outer quadrant biopsy 08/06/2012 for a clinical mT2 N0, stage IIA invasive ductal carcinoma, estrogen receptor 3+ positive, progesterone receptor and HER-2 negative, Ki67 3+ [[id number]]  (2) treated neoadjuvantly with dose dense cyclophosphamide and doxorubicin x4, completed 10/25/2012, followed by a single dose of weekly paclitaxel, poorly tolerated; followed by 11 doses of Abraxane completed 02/14/2013  (3) status post right lumpectomy and sentinel lymph node sampling 03/10/2013 for a ypT2 pN0, stage IIA invasive ductal carcinoma, grade 2, estrogen receptor 100% positive, progesterone receptor 21% positive, with an MIB-1 of 14%, and HER-2 amplified, the signals ratio being 2.52, the number per cell 2.90  (4) adjuvant radiation completed March 2015 (?)  (5) started tamoxifen March 2015, stopped 05/25/14 because of superficial clot to left cephalic vein; began anastrozole on 05/29/14, discontinued after 2  weeks with rash; started letrozole 05/24/2015, discontinued after 5 days use because of angioedema-like symptoms  (6) started trastuzumab 05/23/2013, completed 05/15/14  (7) osteopenia: With T score of -1.7 on bone density scan at [facility] 06/10/2014.   (a) with dental clearance started denosumab/Prolia   (8) fulvestrant started 06/23/2015  PLAN: [place] is tolerating the fulvestrant well, to the best of my ability to tell, and the plan is to continue that for a total of 5 years.  It is difficult to know whether the Prolia caused all the symptoms she is complaining about in her gums, teeth, bones, and joints. Usually when patients have side effects from Prolia they resolve after a few days.  She is not scheduled to receive Prolia until April. I am going to see her again in March just to make sure things are stable and we will discuss at that time whether she wishes to continue on that medication or not. Of course she is receiving at primarily for her osteopenia not for her breast cancer.  The good news is that Andreka is now just about 3 years out from definitive surgery for her breast cancer with no evidence of recurrence. That is very favorable.  She knows to call for any problems that may develop before her next visit here.    Chauncey Cruel, MD   02/05/2016 9:19 AM

## 2016-03-02 ENCOUNTER — Other Ambulatory Visit (HOSPITAL_BASED_OUTPATIENT_CLINIC_OR_DEPARTMENT_OTHER): Payer: BLUE CROSS/BLUE SHIELD

## 2016-03-02 ENCOUNTER — Ambulatory Visit (HOSPITAL_BASED_OUTPATIENT_CLINIC_OR_DEPARTMENT_OTHER): Payer: BLUE CROSS/BLUE SHIELD

## 2016-03-02 VITALS — BP 125/87 | HR 110 | Temp 98.7°F | Resp 20

## 2016-03-02 DIAGNOSIS — C50411 Malignant neoplasm of upper-outer quadrant of right female breast: Secondary | ICD-10-CM

## 2016-03-02 DIAGNOSIS — M8580 Other specified disorders of bone density and structure, unspecified site: Secondary | ICD-10-CM

## 2016-03-02 DIAGNOSIS — Z5111 Encounter for antineoplastic chemotherapy: Secondary | ICD-10-CM

## 2016-03-02 LAB — CBC WITH DIFFERENTIAL/PLATELET
BASO%: 0.5 % (ref 0.0–2.0)
BASOS ABS: 0 10*3/uL (ref 0.0–0.1)
EOS ABS: 0.1 10*3/uL (ref 0.0–0.5)
EOS%: 1.4 % (ref 0.0–7.0)
HEMATOCRIT: 41.6 % (ref 34.8–46.6)
HGB: 13.7 g/dL (ref 11.6–15.9)
LYMPH#: 0.7 10*3/uL — AB (ref 0.9–3.3)
LYMPH%: 12.4 % — ABNORMAL LOW (ref 14.0–49.7)
MCH: 29.6 pg (ref 25.1–34.0)
MCHC: 33 g/dL (ref 31.5–36.0)
MCV: 89.6 fL (ref 79.5–101.0)
MONO#: 0.4 10*3/uL (ref 0.1–0.9)
MONO%: 6.6 % (ref 0.0–14.0)
NEUT#: 4.7 10*3/uL (ref 1.5–6.5)
NEUT%: 79.1 % — AB (ref 38.4–76.8)
PLATELETS: 277 10*3/uL (ref 145–400)
RBC: 4.64 10*6/uL (ref 3.70–5.45)
RDW: 13.2 % (ref 11.2–14.5)
WBC: 5.9 10*3/uL (ref 3.9–10.3)

## 2016-03-02 LAB — COMPREHENSIVE METABOLIC PANEL
ALT: 51 U/L (ref 0–55)
ANION GAP: 11 meq/L (ref 3–11)
AST: 40 U/L — ABNORMAL HIGH (ref 5–34)
Albumin: 4.2 g/dL (ref 3.5–5.0)
Alkaline Phosphatase: 70 U/L (ref 40–150)
BUN: 10.5 mg/dL (ref 7.0–26.0)
CALCIUM: 9.2 mg/dL (ref 8.4–10.4)
CO2: 24 mEq/L (ref 22–29)
CREATININE: 0.8 mg/dL (ref 0.6–1.1)
Chloride: 105 mEq/L (ref 98–109)
EGFR: 89 mL/min/{1.73_m2} — AB (ref >90)
Glucose: 106 mg/dl (ref 70–140)
Potassium: 4.2 mEq/L (ref 3.5–5.1)
Sodium: 139 mEq/L (ref 136–145)
Total Bilirubin: 0.59 mg/dL (ref 0.20–1.20)
Total Protein: 7.7 g/dL (ref 6.4–8.3)

## 2016-03-02 MED ORDER — FULVESTRANT 250 MG/5ML IM SOLN
500.0000 mg | INTRAMUSCULAR | Status: DC
  Administered 2016-03-02: 500 mg via INTRAMUSCULAR
  Filled 2016-03-02: qty 10

## 2016-03-02 NOTE — Patient Instructions (Signed)

## 2016-03-30 ENCOUNTER — Ambulatory Visit (HOSPITAL_BASED_OUTPATIENT_CLINIC_OR_DEPARTMENT_OTHER): Payer: BLUE CROSS/BLUE SHIELD

## 2016-03-30 ENCOUNTER — Other Ambulatory Visit (HOSPITAL_BASED_OUTPATIENT_CLINIC_OR_DEPARTMENT_OTHER): Payer: BLUE CROSS/BLUE SHIELD

## 2016-03-30 VITALS — BP 126/91 | HR 91 | Temp 98.1°F

## 2016-03-30 DIAGNOSIS — M8580 Other specified disorders of bone density and structure, unspecified site: Secondary | ICD-10-CM

## 2016-03-30 DIAGNOSIS — Z5111 Encounter for antineoplastic chemotherapy: Secondary | ICD-10-CM | POA: Diagnosis not present

## 2016-03-30 DIAGNOSIS — C50411 Malignant neoplasm of upper-outer quadrant of right female breast: Secondary | ICD-10-CM

## 2016-03-30 LAB — CBC WITH DIFFERENTIAL/PLATELET
BASO%: 0.9 % (ref 0.0–2.0)
Basophils Absolute: 0 10*3/uL (ref 0.0–0.1)
EOS%: 4.2 % (ref 0.0–7.0)
Eosinophils Absolute: 0.2 10*3/uL (ref 0.0–0.5)
HCT: 42.4 % (ref 34.8–46.6)
HGB: 14.1 g/dL (ref 11.6–15.9)
LYMPH%: 28.7 % (ref 14.0–49.7)
MCH: 29.8 pg (ref 25.1–34.0)
MCHC: 33.2 g/dL (ref 31.5–36.0)
MCV: 89.7 fL (ref 79.5–101.0)
MONO#: 0.4 10*3/uL (ref 0.1–0.9)
MONO%: 8.7 % (ref 0.0–14.0)
NEUT%: 57.5 % (ref 38.4–76.8)
NEUTROS ABS: 2.9 10*3/uL (ref 1.5–6.5)
PLATELETS: 285 10*3/uL (ref 145–400)
RBC: 4.72 10*6/uL (ref 3.70–5.45)
RDW: 13.4 % (ref 11.2–14.5)
WBC: 5 10*3/uL (ref 3.9–10.3)
lymph#: 1.4 10*3/uL (ref 0.9–3.3)

## 2016-03-30 MED ORDER — FULVESTRANT 250 MG/5ML IM SOLN
500.0000 mg | INTRAMUSCULAR | Status: DC
  Administered 2016-03-30: 500 mg via INTRAMUSCULAR
  Filled 2016-03-30: qty 10

## 2016-04-27 ENCOUNTER — Other Ambulatory Visit (HOSPITAL_BASED_OUTPATIENT_CLINIC_OR_DEPARTMENT_OTHER): Payer: BLUE CROSS/BLUE SHIELD

## 2016-04-27 ENCOUNTER — Ambulatory Visit (HOSPITAL_BASED_OUTPATIENT_CLINIC_OR_DEPARTMENT_OTHER): Payer: BLUE CROSS/BLUE SHIELD | Admitting: Adult Health

## 2016-04-27 ENCOUNTER — Ambulatory Visit (HOSPITAL_BASED_OUTPATIENT_CLINIC_OR_DEPARTMENT_OTHER): Payer: BLUE CROSS/BLUE SHIELD

## 2016-04-27 ENCOUNTER — Ambulatory Visit (HOSPITAL_COMMUNITY)
Admission: RE | Admit: 2016-04-27 | Discharge: 2016-04-27 | Disposition: A | Discharge status: Home / Self Care | Payer: BLUE CROSS/BLUE SHIELD | Source: Ambulatory Visit | Attending: Adult Health | Admitting: Adult Health

## 2016-04-27 ENCOUNTER — Encounter: Payer: Self-pay | Admitting: Adult Health

## 2016-04-27 VITALS — BP 142/83 | HR 93 | Temp 98.4°F | Resp 18

## 2016-04-27 DIAGNOSIS — M25561 Pain in right knee: Secondary | ICD-10-CM

## 2016-04-27 DIAGNOSIS — Z170 Estrogen receptor positive status [ER+]: Secondary | ICD-10-CM

## 2016-04-27 DIAGNOSIS — M8580 Other specified disorders of bone density and structure, unspecified site: Secondary | ICD-10-CM

## 2016-04-27 DIAGNOSIS — Z5111 Encounter for antineoplastic chemotherapy: Secondary | ICD-10-CM

## 2016-04-27 DIAGNOSIS — C50411 Malignant neoplasm of upper-outer quadrant of right female breast: Secondary | ICD-10-CM | POA: Diagnosis not present

## 2016-04-27 LAB — CBC WITH DIFFERENTIAL/PLATELET
BASO%: 0.9 % (ref 0.0–2.0)
Basophils Absolute: 0.1 10*3/uL (ref 0.0–0.1)
EOS%: 4.6 % (ref 0.0–7.0)
Eosinophils Absolute: 0.3 10*3/uL (ref 0.0–0.5)
HCT: 40.4 % (ref 34.8–46.6)
HGB: 13.4 g/dL (ref 11.6–15.9)
LYMPH%: 27.9 % (ref 14.0–49.7)
MCH: 29.9 pg (ref 25.1–34.0)
MCHC: 33.2 g/dL (ref 31.5–36.0)
MCV: 90.2 fL (ref 79.5–101.0)
MONO#: 0.5 10*3/uL (ref 0.1–0.9)
MONO%: 9.4 % (ref 0.0–14.0)
NEUT#: 3.1 10*3/uL (ref 1.5–6.5)
NEUT%: 57.2 % (ref 38.4–76.8)
Platelets: 286 10*3/uL (ref 145–400)
RBC: 4.48 10*6/uL (ref 3.70–5.45)
RDW: 12.9 % (ref 11.2–14.5)
WBC: 5.4 10*3/uL (ref 3.9–10.3)
lymph#: 1.5 10*3/uL (ref 0.9–3.3)

## 2016-04-27 LAB — COMPREHENSIVE METABOLIC PANEL
ALT: 34 U/L (ref 0–55)
AST: 20 U/L (ref 5–34)
Albumin: 4 g/dL (ref 3.5–5.0)
Alkaline Phosphatase: 67 U/L (ref 40–150)
Anion Gap: 8 mEq/L (ref 3–11)
BUN: 10.5 mg/dL (ref 7.0–26.0)
CHLORIDE: 107 meq/L (ref 98–109)
CO2: 28 meq/L (ref 22–29)
CREATININE: 0.8 mg/dL (ref 0.6–1.1)
Calcium: 10 mg/dL (ref 8.4–10.4)
EGFR: >90 mL/min/{1.73_m2} (ref >90)
Glucose: 94 mg/dl (ref 70–140)
Potassium: 4.4 mEq/L (ref 3.5–5.1)
Sodium: 142 mEq/L (ref 136–145)
Total Bilirubin: 0.36 mg/dL (ref 0.20–1.20)
Total Protein: 7.4 g/dL (ref 6.4–8.3)

## 2016-04-27 MED ORDER — DICLOFENAC SODIUM 75 MG PO TBEC: 75.0000 mg | DELAYED_RELEASE_TABLET | Freq: Two times a day (BID) | ORAL | 0 refills | Status: DC

## 2016-04-27 MED ORDER — FULVESTRANT 250 MG/5ML IM SOLN
500.0000 mg | INTRAMUSCULAR | Status: DC
  Administered 2016-04-27: 500 mg via INTRAMUSCULAR
  Filled 2016-04-27: qty 10

## 2016-04-27 NOTE — Patient Instructions (Signed)

## 2016-04-27 NOTE — Progress Notes (Signed)
[facility]  [phone] [phone]     ID: Elizabeth Miles DOB: 03/11/1965  MR#: [id number]  CSN#:[id number]  Patient Care Team: Daniel L Pomposini, MD as PCP - General (Internal Medicine) Daniel L Pomposini, MD as Referring Physician (Internal Medicine) Matthew Wakefield, MD as Consulting Physician (General Surgery) Gustav C Magrinat, MD as Consulting Physician (Oncology) Lindsey N Cornetto, NP as Nurse Practitioner (Hematology and Oncology) GYN: Sheronette Cousins MD  CHIEF COMPLAINT: right breat cancer  CURRENT TREATMENT: Fulvestrant, denosumab/Prolia  BREAST CANCER HISTORY: From Dr. Kalsoom Khan's earlier notes:  "Elizabeth Miles is a 51 y.o. female. Without significant past medical history who on June 2013 had a mammogram that was normal. But there was on physical exam possibility of a cyst noted in the right breast. The mammogram was negative. In 2014 June patient noted on exam another lump in the right breast. She underwent a diagnostic mammogram on June 10 that showed a right breast nodule in the outer quadrant. She had an ultrasound performed that showed at the 9:30 o'clock position a 3.6 cm area and then added 10:00 position 1.3 cm area with a total area being anywhere between 5-6 cm. The patient went on to have a right breast biopsy performed in [place]. The pathology revealed an invasive ductal carcinoma. This is been confirmed by our pathology as well. The carcinoma and papillary features and was felt to be between a grade 1 and 2. The tumor was estrogen receptor positive strongly (100%) progesterone receptor negative HER-2/neu negative with a Ki-67 that showed a high proliferation rate. Patient is now seen in medical oncology for discussion of treatment options."  Bilateral breast MRI on 08/27/2012 revealed in the right upper quadrant irregular lobulated mass with a satellite nodule or lobulation within 2 mm at its superior aspect, measured  together as 4.5 x 4.0 x 3.8 cm. There was extension of enhancement to the nipple, suggesting nipple involvement may be present. No lymphadenopathy was noted there was no any other area of abnormal enhancement in either breast (clinical stage IIA, T2 N0).  PET scan performed on 08/29/2012 revealed the primary breast cancer measuring 3.2 x 3.3 cm with SUV of 16. It was adjacent nodule along the superiomedial border of the primary mass measuring 1.4 cm which was also hypermetabolic. There were no additional areas of abnormal hypermetabolism. No abnormal hypermetabolic activity was seen in the chest, abdomen/pelvis,within the liver, pancreas, adrenal glands or spleen. No hypermetabolic lymph nodes. In the skeleton, no focal hypermetabolic activity to suggest skeletal metastasis was seen.  Completed neoadjuvant chemotherapy consisting of Q14 day Adriamycin/Cytoxan x 4 cycles on 10/25/2012, followed by one dose of neoadjuvant Taxol on 11/08/2012. She developed significant grade 2 neuropathy and Taxol was discontinued. Then received neoadjuvant chemotherapy consisting of single agent Abraxane given on day 1, 8, 15 of each 28 day cycle. She completed therapy on 11/15/12 - 02/14/13.   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 Repeat HER-2 testing was now positive. The patient was started on tamoxifen March 2015 and on Herceptin the same month.  Her subsequent history is as detailed below.  INTERVAL HISTORY: [name] is here today for an urgent visit regarding right knee pain that has been worsening over the past 5 months.  She notes it started after she received Prolia.  She says the pain is so severe at   night that it has kept her up.  This is not similar to the pain she experienced while taking Anastrozole.  She is taking 800mg of Ibuprofen every four hours.   She does have pain with movement.    REVIEW OF SYSTEMS: Janaysia denies fevers, chills, fatigue, weight loss, vision changes, cough, shortness of breath, chest pain, palpitations, abdominal pain, or any other concerns.  A detailed ROS was non contributory.     PAST MEDICAL HISTORY: Past Medical History:  Diagnosis Date  . Allergy   . Anxiety   . Breast cancer (HCC) 08/06/12   invasive ductal carcioma  . Complication of anesthesia    1986 ; problem waking up  . GERD (gastroesophageal reflux disease)   . GERD (gastroesophageal reflux disease) 08/22/2012  . Wears glasses     PAST SURGICAL HISTORY: Past Surgical History:  Procedure Laterality Date  . BREAST BIOPSY Right 08/06/12  . BREAST LUMPECTOMY WITH NEEDLE LOCALIZATION AND AXILLARY SENTINEL LYMPH NODE BX Right 03/10/2013   Procedure: BREAST LUMPECTOMY WITH NEEDLE LOCALIZATION AND AXILLARY SENTINEL LYMPH NODE BIOPSY;  Surgeon: Matthew Wakefield, MD;  Location: [facility];  Service: General;  Laterality: Right;  . CERVICAL ABLATION  1983  . DILATION AND CURETTAGE OF UTERUS  1993  . POPLITEAL SYNOVIAL CYST EXCISION  1970  . PORT-A-CATH REMOVAL Left 03/10/2013   Procedure: REMOVAL PORT-A-CATH;  Surgeon: Matthew Wakefield, MD;  Location: [facility];  Service: General;  Laterality: Left;  . PORTACATH PLACEMENT Left 09/12/2012   Procedure: INSERTION PORT-A-CATH;  Surgeon: Matthew Wakefield, MD;  Location: [facility];  Service: General;  Laterality: Left;    FAMILY HISTORY Family History  Problem Relation Age of Onset  . Hypertension Mother   . Bladder Cancer Maternal Uncle 68  . Cancer Paternal Grandmother 63    lung cancer  . Lung cancer Paternal Grandfather     dx in his 60s  . COPD Paternal Uncle    the patient's parents are living and in good health. The patient has one brother, no sisters. There is no history of breast or ovarian cancer in the family to her knowledge  GYNECOLOGIC HISTORY:  Patient's last  menstrual period was 03/12/2012. Menarche age 12, first live birth age 26. The patient is GX P3. 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 Her husband, Ronald, is a construction superintendent. A daughter, Taylor,  lives in [place] and daughter Ashley has a baby girl, the patient's first grandchild, 2 years old as of December 2017; she stays with Marieta frequently.. Son Ronald "Brianne" is 13 as of December 2017 and at home. The patient is a [employer].    ADVANCED DIRECTIVES: Not in place   HEALTH MAINTENANCE: Social History  Substance Use Topics  . Smoking status: Never Smoker  . Smokeless tobacco: Never Used  . Alcohol use No     Colonoscopy:  PAP:  Bone density:  Lipid panel:  Allergies  Allergen Reactions  . Anastrozole Anaphylaxis  . Darvocet [Propoxyphene N-Acetaminophen] Shortness Of Breath and Swelling  . Shellfish Allergy Shortness Of Breath and Swelling  . Other     CT dye, "chest hurt" "makes me feel cold"    Current Outpatient Prescriptions  Medication Sig Dispense Refill  . acetaminophen (TYLENOL) 500 MG tablet Take 1,000 mg by mouth every 6 (six) hours as needed. Reported on 06/14/2015    .   aspirin 325 MG tablet Take 81 mg by mouth daily.    . cetirizine (ZYRTEC) 10 MG tablet Take 10 mg by mouth daily.    . [employer] D3 1000 units capsule TAKE ONE CAPSULE BY MOUTH EVERY DAY 90 capsule 3  . Dexlansoprazole (DEXILANT) 30 MG capsule Take 1 capsule (30 mg total) by mouth daily. 90 capsule 4  . ibuprofen (ADVIL,MOTRIN) 800 MG tablet Take 800 mg by mouth every 8 (eight) hours as needed for moderate pain (for back pain). Reported on 06/14/2015    . metroNIDAZOLE (METROGEL) 1 % gel Apply topically daily. 45 g 0  . venlafaxine XR (EFFEXOR-XR) 75 MG 24 hr capsule TAKE 1 CAPSULE (75 MG TOTAL) BY MOUTH DAILY WITH BREAKFAST. 30 capsule 4  .  venlafaxine XR (EFFEXOR-XR) 75 MG 24 hr capsule Take 1 capsule (75 mg total) by mouth daily with breakfast. 90 capsule 4   No current facility-administered medications for this visit.     OBJECTIVE:  Vitals:   04/27/16 1123  BP: (!) 142/83  Pulse: 93  Resp: 18  Temp: 98.4 F (36.9 C)     There is no height or weight on file to calculate BMI.    ECOG FS:1 - Symptomatic but completely ambulatory GENERAL: Patient is a well appearing female in no acute distress HEENT:  Sclerae anicteric.  Oropharynx clear and moist. No ulcerations or evidence of oropharyngeal candidiasis. Neck is supple.  NODES:  No cervical, supraclavicular, or axillary lymphadenopathy palpated.  BREAST EXAM:  Deferred. LUNGS:  Clear to auscultation bilaterally.  No wheezes or rhonchi. HEART:  Regular rate and rhythm. No murmur appreciated. ABDOMEN:  Soft, nontender.  Positive, normoactive bowel sounds. No organomegaly palpated. MSK:  No focal spinal tenderness to palpation. Medial anterior knee TTP, no crepitus, FROM without difficutly EXTREMITIES:  No peripheral edema.   SKIN:  Clear with no obvious rashes or skin changes. No nail dyscrasia. NEURO:  Nonfocal. Well oriented.  Appropriate affect.   LAB RESULTS:  CMP     Component Value Date/Time   NA 142 04/27/2016 1033   K 4.4 04/27/2016 1033   CL 105 08/17/2013 0346   CO2 28 04/27/2016 1033   GLUCOSE 94 04/27/2016 1033   BUN 10.5 04/27/2016 1033   CREATININE 0.8 04/27/2016 1033   CALCIUM 10.0 04/27/2016 1033   PROT 7.4 04/27/2016 1033   ALBUMIN 4.0 04/27/2016 1033   AST 20 04/27/2016 1033   ALT 34 04/27/2016 1033   ALKPHOS 67 04/27/2016 1033   BILITOT 0.36 04/27/2016 1033    I No results found for: SPEP  Lab Results  Component Value Date   WBC 5.4 04/27/2016   NEUTROABS 3.1 04/27/2016   HGB 13.4 04/27/2016   HCT 40.4 04/27/2016   MCV 90.2 04/27/2016   PLT 286 04/27/2016      Chemistry      Component Value Date/Time   NA 142 04/27/2016  1033   K 4.4 04/27/2016 1033   CL 105 08/17/2013 0346   CO2 28 04/27/2016 1033   BUN 10.5 04/27/2016 1033   CREATININE 0.8 04/27/2016 1033      Component Value Date/Time   CALCIUM 10.0 04/27/2016 1033   ALKPHOS 67 04/27/2016 1033   AST 20 04/27/2016 1033   ALT 34 04/27/2016 1033   BILITOT 0.36 04/27/2016 1033       No results found for: LABCA2  No components found for: LABCA125  No results for input(s): INR in the last 168 hours.  Urinalysis      Component Value Date/Time   LABSPEC 1.020 09/28/2015 1224   PHURINE 5.0 09/28/2015 1224   GLUCOSEU Negative 09/28/2015 1224   HGBUR Negative 09/28/2015 1224   BILIRUBINUR Negative 09/28/2015 1224   KETONESUR Negative 09/28/2015 1224   PROTEINUR Negative 09/28/2015 1224   UROBILINOGEN 0.2 09/28/2015 1224   NITRITE Negative 09/28/2015 1224   LEUKOCYTESUR Negative 09/28/2015 1224    STUDIES:  No results found.    ASSESSMENT: 51 y.o. [place] woman  (1) status post right breast upper outer quadrant biopsy 08/06/2012 for a clinical mT2 N0, stage IIA invasive ductal carcinoma, estrogen receptor 3+ positive, progesterone receptor and HER-2 negative, Ki67 3+ [[id number]]  (2) treated neoadjuvantly with dose dense cyclophosphamide and doxorubicin x4, completed 10/25/2012, followed by a single dose of weekly paclitaxel, poorly tolerated; followed by 11 doses of Abraxane completed 02/14/2013  (3) status post right lumpectomy and sentinel lymph node sampling 03/10/2013 for a ypT2 pN0, stage IIA invasive ductal carcinoma, grade 2, estrogen receptor 100% positive, progesterone receptor 21% positive, with an MIB-1 of 14%, and HER-2 amplified, the signals ratio being 2.52, the number per cell 2.90  (4) adjuvant radiation completed March 2015 (?)  (5) started tamoxifen March 2015, stopped 05/25/14 because of superficial clot to left cephalic vein; began anastrozole on 05/29/14, discontinued after 2 weeks with rash; started letrozole  05/24/2015, discontinued after 5 days use because of angioedema-like symptoms  (6) started trastuzumab 05/23/2013, completed 05/15/14  (7) osteopenia: With T score of -1.7 on bone density scan at [facility] 06/10/2014.   (a) with dental clearance started denosumab/Prolia   (8) fulvestrant started 06/23/2015  PLAN:  Yamili's knee pain and ibuprofen use is concerning.  Will get xray of her right knee and I prescribed Diclofenac EC BID with meals for her to take.  I instructed her to stop taking the Ibuprofen while on the Diclofenac.  She may need ortho referral and MRI if it doesn't improve with NSAIDS.  She knows to call for any problems that may develop before her next visit here.  A total of (30) minutes of face-to-face time was spent with this patient with greater than 50% of that time in counseling and care-coordination.    Charlestine Massed, NP   04/27/2016 11:36 AM

## 2016-04-28 ENCOUNTER — Telehealth: Payer: Self-pay | Admitting: *Deleted

## 2016-04-28 NOTE — Telephone Encounter (Signed)
Called patient and informed her with the results. Patient verbalized understanding.

## 2016-04-28 NOTE — Telephone Encounter (Signed)
-----   Message from Minette Headland, NP sent at 04/27/2016  2:09 PM EST ----- Please call patient with results

## 2016-05-12 DIAGNOSIS — E785 Hyperlipidemia, unspecified: Secondary | ICD-10-CM | POA: Insufficient documentation

## 2016-05-25 ENCOUNTER — Other Ambulatory Visit (HOSPITAL_BASED_OUTPATIENT_CLINIC_OR_DEPARTMENT_OTHER): Payer: BLUE CROSS/BLUE SHIELD

## 2016-05-25 ENCOUNTER — Ambulatory Visit (HOSPITAL_BASED_OUTPATIENT_CLINIC_OR_DEPARTMENT_OTHER): Payer: BLUE CROSS/BLUE SHIELD

## 2016-05-25 ENCOUNTER — Ambulatory Visit (HOSPITAL_BASED_OUTPATIENT_CLINIC_OR_DEPARTMENT_OTHER): Payer: BLUE CROSS/BLUE SHIELD | Admitting: Oncology

## 2016-05-25 VITALS — BP 135/84 | HR 93 | Temp 98.0°F | Resp 20 | Ht 67.0 in | Wt 223.1 lb

## 2016-05-25 DIAGNOSIS — Z170 Estrogen receptor positive status [ER+]: Secondary | ICD-10-CM

## 2016-05-25 DIAGNOSIS — M8580 Other specified disorders of bone density and structure, unspecified site: Secondary | ICD-10-CM

## 2016-05-25 DIAGNOSIS — Z5111 Encounter for antineoplastic chemotherapy: Secondary | ICD-10-CM | POA: Diagnosis not present

## 2016-05-25 DIAGNOSIS — C50411 Malignant neoplasm of upper-outer quadrant of right female breast: Secondary | ICD-10-CM

## 2016-05-25 DIAGNOSIS — Z79811 Long term (current) use of aromatase inhibitors: Secondary | ICD-10-CM | POA: Diagnosis not present

## 2016-05-25 LAB — CBC WITH DIFFERENTIAL/PLATELET
BASO%: 0.8 % (ref 0.0–2.0)
Basophils Absolute: 0.1 10*3/uL (ref 0.0–0.1)
EOS%: 3.9 % (ref 0.0–7.0)
Eosinophils Absolute: 0.2 10*3/uL (ref 0.0–0.5)
HEMATOCRIT: 40.8 % (ref 34.8–46.6)
HGB: 13.3 g/dL (ref 11.6–15.9)
LYMPH#: 1.4 10*3/uL (ref 0.9–3.3)
LYMPH%: 23.1 % (ref 14.0–49.7)
MCH: 29.6 pg (ref 25.1–34.0)
MCHC: 32.6 g/dL (ref 31.5–36.0)
MCV: 90.7 fL (ref 79.5–101.0)
MONO#: 0.5 10*3/uL (ref 0.1–0.9)
MONO%: 7.9 % (ref 0.0–14.0)
NEUT%: 64.3 % (ref 38.4–76.8)
NEUTROS ABS: 4 10*3/uL (ref 1.5–6.5)
Platelets: 279 10*3/uL (ref 145–400)
RBC: 4.5 10*6/uL (ref 3.70–5.45)
RDW: 13 % (ref 11.2–14.5)
WBC: 6.2 10*3/uL (ref 3.9–10.3)

## 2016-05-25 MED ORDER — FULVESTRANT 250 MG/5ML IM SOLN
500.0000 mg | INTRAMUSCULAR | Status: DC
  Administered 2016-05-25: 500 mg via INTRAMUSCULAR
  Filled 2016-05-25: qty 10

## 2016-05-25 NOTE — Patient Instructions (Signed)

## 2016-05-25 NOTE — Progress Notes (Signed)
[facility]  [phone] [phone]     ID: Elizabeth Miles DOB: 09/11/1965  MR#: [id number]  CSN#:[id number]  Patient Care Team: Daniel L Pomposini, MD as PCP - General (Internal Medicine) Daniel L Pomposini, MD as Referring Physician (Internal Medicine) Matthew Wakefield, MD as Consulting Physician (General Surgery)  C , MD as Consulting Physician (Oncology) Lindsey Cornetto Causey, NP as Nurse Practitioner (Hematology and Oncology) GYN: Sheronette Cousins MD  CHIEF COMPLAINT: right breat cancer  CURRENT TREATMENT: Fulvestrant, [denosumab/Prolia]  BREAST CANCER HISTORY: From Dr. Kalsoom Khan's earlier notes:  "Elizabeth Miles is a 51 y.o. female. Without significant past medical history who on June 2013 had a mammogram that was normal. But there was on physical exam possibility of a cyst noted in the right breast. The mammogram was negative. In 2014 June patient noted on exam another lump in the right breast. She underwent a diagnostic mammogram on June 10 that showed a right breast nodule in the outer quadrant. She had an ultrasound performed that showed at the 9:30 o'clock position a 3.6 cm area and then added 10:00 position 1.3 cm area with a total area being anywhere between 5-6 cm. The patient went on to have a right breast biopsy performed in [place]. The pathology revealed an invasive ductal carcinoma. This is been confirmed by our pathology as well. The carcinoma and papillary features and was felt to be between a grade 1 and 2. The tumor was estrogen receptor positive strongly (100%) progesterone receptor negative HER-2/neu negative with a Ki-67 that showed a high proliferation rate. Patient is now seen in medical oncology for discussion of treatment options."  Bilateral breast MRI on 08/27/2012 revealed in the right upper quadrant irregular lobulated mass with a satellite nodule or lobulation within 2 mm at its superior aspect, measured  together as 4.5 x 4.0 x 3.8 cm. There was extension of enhancement to the nipple, suggesting nipple involvement may be present. No lymphadenopathy was noted there was no any other area of abnormal enhancement in either breast (clinical stage IIA, T2 N0).  PET scan performed on 08/29/2012 revealed the primary breast cancer measuring 3.2 x 3.3 cm with SUV of 16. It was adjacent nodule along the superiomedial border of the primary mass measuring 1.4 cm which was also hypermetabolic. There were no additional areas of abnormal hypermetabolism. No abnormal hypermetabolic activity was seen in the chest, abdomen/pelvis,within the liver, pancreas, adrenal glands or spleen. No hypermetabolic lymph nodes. In the skeleton, no focal hypermetabolic activity to suggest skeletal metastasis was seen.  Completed neoadjuvant chemotherapy consisting of Q14 day Adriamycin/Cytoxan x 4 cycles on 10/25/2012, followed by one dose of neoadjuvant Taxol on 11/08/2012. She developed significant grade 2 neuropathy and Taxol was discontinued. Then received neoadjuvant chemotherapy consisting of single agent Abraxane given on day 1, 8, 15 of each 28 day cycle. She completed therapy on 11/15/12 - 02/14/13.   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 Repeat HER-2 testing was now positive. The patient was started on tamoxifen March 2015 and on Herceptin the same month.  Her subsequent history is as detailed below.  INTERVAL HISTORY: Elizabeth Miles returns today for follow-up of her estrogen receptor positive breast cancer. She continues on fulvestrant every 4 weeks, with good tolerance. She does say that about a week after each dose she loses her sense of taste and   that lasts about a week  She also received 1 dose of denosumab/Prolia. However she has had some pain in her teeth since that time  and she refuses any further denosumab treatments. She does say she was evaluated by her dentist and her teeth look fine but they do not feel fine this is an issue with the teeth not with the jaw. Accordingly the Prolia is being discontinued.  REVIEW OF SYSTEMS: Elizabeth Miles never feels well. She doesn't stay and bed though she keeps her one and a half-year-old grandson most days and does housework, they did drip painting covered with resident all over her kitchen counter, and is doing paintings of [place]'s for another grandchild. She is into pink and is wearing a pink T-shirt and shoes today she continues to have problems with bowel movements and somehow can have both diarrhea and constipation on the same day. Aside from these issues a detailed review of systems today was stable   PAST MEDICAL HISTORY: Past Medical History:  Diagnosis Date  . Allergy   . Anxiety   . Breast cancer (HCC) 08/06/12   invasive ductal carcioma  . Complication of anesthesia    1986 ; problem waking up  . GERD (gastroesophageal reflux disease)   . GERD (gastroesophageal reflux disease) 08/22/2012  . Wears glasses     PAST SURGICAL HISTORY: Past Surgical History:  Procedure Laterality Date  . BREAST BIOPSY Right 08/06/12  . BREAST LUMPECTOMY WITH NEEDLE LOCALIZATION AND AXILLARY SENTINEL LYMPH NODE BX Right 03/10/2013   Procedure: BREAST LUMPECTOMY WITH NEEDLE LOCALIZATION AND AXILLARY SENTINEL LYMPH NODE BIOPSY;  Surgeon: Matthew Wakefield, MD;  Location: [facility];  Service: General;  Laterality: Right;  . CERVICAL ABLATION  1983  . DILATION AND CURETTAGE OF UTERUS  1993  . POPLITEAL SYNOVIAL CYST EXCISION  1970  . PORT-A-CATH REMOVAL Left 03/10/2013   Procedure: REMOVAL PORT-A-CATH;  Surgeon: Matthew Wakefield, MD;  Location: [facility];  Service: General;  Laterality: Left;  . PORTACATH PLACEMENT Left 09/12/2012   Procedure: INSERTION PORT-A-CATH;  Surgeon: Matthew Wakefield, MD;   Location: [facility];  Service: General;  Laterality: Left;    FAMILY HISTORY Family History  Problem Relation Age of Onset  . Hypertension Mother   . Bladder Cancer Maternal Uncle 68  . Cancer Paternal Grandmother 63    lung cancer  . Lung cancer Paternal Grandfather     dx in his 60s  . COPD Paternal Uncle    the patient's parents are living and in good health. The patient has one brother, no sisters. There is no history of breast or ovarian cancer in the family to her knowledge  GYNECOLOGIC HISTORY:  Patient's last menstrual period was 03/12/2012. Menarche age 12, first live birth age 26. The patient is GX P3. 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 Her husband, Ronald, is a construction superintendent. A daughter, Taylor,  lives in [place] and daughter Ashley has a baby girl, the patient's first grandchild, 2 years old as of December 2017; she stays with Elizabeth Miles frequently.. Son Ronald "Brianne" is 13 as of December 2017 and at home. The patient is a [employer].    ADVANCED DIRECTIVES: Not in place   HEALTH MAINTENANCE: Social History  Substance Use Topics  . Smoking status: Never Smoker  . Smokeless tobacco: Never Used  . Alcohol use No       Colonoscopy:  PAP:  Bone density:  Lipid panel:  Allergies  Allergen Reactions  . Anastrozole Anaphylaxis  . Darvocet [Propoxyphene N-Acetaminophen] Shortness Of Breath and Swelling  . Shellfish Allergy Shortness Of Breath and Swelling  . Other     CT dye, "chest hurt" "makes me feel cold"    Current Outpatient Prescriptions  Medication Sig Dispense Refill  . acetaminophen (TYLENOL) 500 MG tablet Take 1,000 mg by mouth every 6 (six) hours as needed. Reported on 06/14/2015    . aspirin 325 MG tablet Take 81 mg by mouth daily.    . cetirizine (ZYRTEC) 10 MG tablet Take 10 mg by mouth daily.    .  [employer] D3 1000 units capsule TAKE ONE CAPSULE BY MOUTH EVERY DAY 90 capsule 3  . Dexlansoprazole (DEXILANT) 30 MG capsule Take 1 capsule (30 mg total) by mouth daily. 90 capsule 4  . diclofenac (VOLTAREN) 75 MG EC tablet Take 1 tablet (75 mg total) by mouth 2 (two) times daily. 60 tablet 0  . ibuprofen (ADVIL,MOTRIN) 800 MG tablet Take 800 mg by mouth every 8 (eight) hours as needed for moderate pain (for back pain). Reported on 06/14/2015    . metroNIDAZOLE (METROGEL) 1 % gel Apply topically daily. 45 g 0  . venlafaxine XR (EFFEXOR-XR) 75 MG 24 hr capsule TAKE 1 CAPSULE (75 MG TOTAL) BY MOUTH DAILY WITH BREAKFAST. 30 capsule 4  . venlafaxine XR (EFFEXOR-XR) 75 MG 24 hr capsule Take 1 capsule (75 mg total) by mouth daily with breakfast. 90 capsule 4   No current facility-administered medications for this visit.     OBJECTIVE: Middle-aged white woman in no acute distress  Vitals:   05/25/16 1014  BP: 135/84  Pulse: 93  Resp: 20  Temp: 98 F (36.7 C)     Body mass index is 34.94 kg/m.    ECOG FS:1 - Symptomatic but completely ambulatory  Sclerae unicteric, pupils round and equal Oropharynx clear and moist No cervical or supraclavicular adenopathy Lungs no rales or rhonchi Heart regular rate and rhythm Abd soft, nontender, positive bowel sounds MSK no focal spinal tenderness, no upper extremity lymphedema Neuro: nonfocal, well oriented, appropriate affect Breasts: The right breast is status post lumpectomy and radiation. There is no evidence of local recurrence. The left breast is unremarkable. Both axillae are benign.   LAB RESULTS:  CMP     Component Value Date/Time   NA 142 04/27/2016 1033   K 4.4 04/27/2016 1033   CL 105 08/17/2013 0346   CO2 28 04/27/2016 1033   GLUCOSE 94 04/27/2016 1033   BUN 10.5 04/27/2016 1033   CREATININE 0.8 04/27/2016 1033   CALCIUM 10.0 04/27/2016 1033   PROT 7.4 04/27/2016 1033   ALBUMIN 4.0 04/27/2016 1033   AST 20 04/27/2016 1033   ALT 34  04/27/2016 1033   ALKPHOS 67 04/27/2016 1033   BILITOT 0.36 04/27/2016 1033    I No results found for: SPEP  Lab Results  Component Value Date   WBC 6.2 05/25/2016   NEUTROABS 4.0 05/25/2016   HGB 13.3 05/25/2016   HCT 40.8 05/25/2016   MCV 90.7 05/25/2016   PLT 279 05/25/2016      Chemistry      Component Value Date/Time   NA 142 04/27/2016 1033   K 4.4 04/27/2016 1033   CL 105 08/17/2013 0346   CO2 28 04/27/2016 1033   BUN 10.5 04/27/2016 1033   CREATININE 0.8 04/27/2016 1033      Component   Value Date/Time   CALCIUM 10.0 04/27/2016 1033   ALKPHOS 67 04/27/2016 1033   AST 20 04/27/2016 1033   ALT 34 04/27/2016 1033   BILITOT 0.36 04/27/2016 1033       No results found for: LABCA2  No components found for: LABCA125  No results for input(s): INR in the last 168 hours.  Urinalysis    Component Value Date/Time   LABSPEC 1.020 09/28/2015 1224   PHURINE 5.0 09/28/2015 1224   GLUCOSEU Negative 09/28/2015 1224   HGBUR Negative 09/28/2015 1224   BILIRUBINUR Negative 09/28/2015 1224   KETONESUR Negative 09/28/2015 1224   PROTEINUR Negative 09/28/2015 1224   UROBILINOGEN 0.2 09/28/2015 1224   NITRITE Negative 09/28/2015 1224   LEUKOCYTESUR Negative 09/28/2015 1224    STUDIES:  Dg Knee 3 Views Right  Result Date: 04/27/2016 CLINICAL DATA:  Right knee pain for several months, no known injury, initial encounter EXAM: RIGHT KNEE - 3 VIEW COMPARISON:  None. FINDINGS: No evidence of fracture, dislocation, or joint effusion. No evidence of arthropathy or other focal bone abnormality. Soft tissues are unremarkable. IMPRESSION: No acute abnormality noted. Electronically Signed   By: Inez Catalina M.D.   On: 04/27/2016 13:54   Mammography February 2018 at the patient's gynecologist reportedly benign. I do not have a copy for review   ASSESSMENT: 51 y.o. [place] woman  (1) status post right breast upper outer quadrant biopsy 08/06/2012 for a clinical mT2 N0,  stage IIA invasive ductal carcinoma, estrogen receptor 3+ positive, progesterone receptor and HER-2 negative, Ki67 3+ [[id number]]  (2) treated neoadjuvantly with dose dense cyclophosphamide and doxorubicin x4, completed 10/25/2012, followed by a single dose of weekly paclitaxel, poorly tolerated; followed by 11 doses of Abraxane completed 02/14/2013  (3) status post right lumpectomy and sentinel lymph node sampling 03/10/2013 for a ypT2 pN0, stage IIA invasive ductal carcinoma, grade 2, estrogen receptor 100% positive, progesterone receptor 21% positive, with an MIB-1 of 14%, and HER-2 amplified, the signals ratio being 2.52, the number per cell 2.90  (a) reclassified as stage IB in the 2018 prognostic classification  (4) adjuvant radiation completed March 2015 (?)  (5) started tamoxifen March 2015, stopped 05/25/14 because of superficial clot to left cephalic vein; began anastrozole on 05/29/14, discontinued after 2 weeks with rash; started letrozole 05/24/2015, discontinued after 5 days use because of angioedema-like symptoms  (6) started trastuzumab 05/23/2013, completed 05/15/14  (7) osteopenia: With T score of -1.7 on [facility] density [facility] 06/10/2014.   (a) with dental clearance started denosumab/Prolia 12/09/2015  (8) fulvestrant started 06/23/2015  PLAN: I spent approximately 30 minutes with Elizabeth Miles with most of that time spent discussing her concerns. Even though he generally feels she is making progress, there continued to be a large number of small issues which keep her from functioning the way she would like, and this is frustrating and depressing to her.  The good news is she is now a little more than 3 years out from definitive surgery for her breast cancer with no evidence of disease recurrence. This is very favorable.  She is tolerating the Faslodex well and ideally we will continue that for a total of 4 years, giving her credit for one year of tamoxifen that she took  originally.  We are discontinuing the Prolia at her request. I do not have a simple explanation why months after a single dose her teeth would hurt in the absence of any pathology noted by her dentist.  She will return to  see me in 6 months. She knows to call for any problems that may develop before that visit.     Chauncey Cruel, MD   05/25/2016 10:38 AM

## 2016-06-08 ENCOUNTER — Telehealth: Payer: Self-pay | Admitting: Oncology

## 2016-06-08 NOTE — Telephone Encounter (Signed)
Faxed pt last office note to [facility] [phone]

## 2016-06-22 ENCOUNTER — Ambulatory Visit (HOSPITAL_BASED_OUTPATIENT_CLINIC_OR_DEPARTMENT_OTHER): Payer: BLUE CROSS/BLUE SHIELD

## 2016-06-22 ENCOUNTER — Other Ambulatory Visit (HOSPITAL_BASED_OUTPATIENT_CLINIC_OR_DEPARTMENT_OTHER): Payer: BLUE CROSS/BLUE SHIELD

## 2016-06-22 VITALS — BP 136/80 | HR 95 | Temp 98.5°F | Resp 20

## 2016-06-22 DIAGNOSIS — C50411 Malignant neoplasm of upper-outer quadrant of right female breast: Secondary | ICD-10-CM

## 2016-06-22 DIAGNOSIS — Z170 Estrogen receptor positive status [ER+]: Secondary | ICD-10-CM

## 2016-06-22 DIAGNOSIS — M8580 Other specified disorders of bone density and structure, unspecified site: Secondary | ICD-10-CM

## 2016-06-22 DIAGNOSIS — Z5111 Encounter for antineoplastic chemotherapy: Secondary | ICD-10-CM

## 2016-06-22 LAB — CBC WITH DIFFERENTIAL/PLATELET
BASO%: 1.1 % (ref 0.0–2.0)
Basophils Absolute: 0.1 10*3/uL (ref 0.0–0.1)
EOS ABS: 0.3 10*3/uL (ref 0.0–0.5)
EOS%: 6 % (ref 0.0–7.0)
HCT: 42.1 % (ref 34.8–46.6)
HGB: 14 g/dL (ref 11.6–15.9)
LYMPH%: 29.4 % (ref 14.0–49.7)
MCH: 29.9 pg (ref 25.1–34.0)
MCHC: 33.3 g/dL (ref 31.5–36.0)
MCV: 89.8 fL (ref 79.5–101.0)
MONO#: 0.5 10*3/uL (ref 0.1–0.9)
MONO%: 8.8 % (ref 0.0–14.0)
NEUT#: 3.1 10*3/uL (ref 1.5–6.5)
NEUT%: 54.7 % (ref 38.4–76.8)
PLATELETS: 304 10*3/uL (ref 145–400)
RBC: 4.69 10*6/uL (ref 3.70–5.45)
RDW: 13.1 % (ref 11.2–14.5)
WBC: 5.6 10*3/uL (ref 3.9–10.3)
lymph#: 1.7 10*3/uL (ref 0.9–3.3)

## 2016-06-22 LAB — COMPREHENSIVE METABOLIC PANEL
ALT: 40 U/L (ref 0–55)
ANION GAP: 12 meq/L — AB (ref 3–11)
AST: 22 U/L (ref 5–34)
Albumin: 4.1 g/dL (ref 3.5–5.0)
Alkaline Phosphatase: 88 U/L (ref 40–150)
BUN: 8.2 mg/dL (ref 7.0–26.0)
CHLORIDE: 105 meq/L (ref 98–109)
CO2: 25 meq/L (ref 22–29)
Calcium: 9.9 mg/dL (ref 8.4–10.4)
Creatinine: 0.8 mg/dL (ref 0.6–1.1)
EGFR: >90 mL/min/{1.73_m2} (ref >90)
GLUCOSE: 87 mg/dL (ref 70–140)
Potassium: 4.4 mEq/L (ref 3.5–5.1)
Sodium: 143 mEq/L (ref 136–145)
Total Bilirubin: 0.3 mg/dL (ref 0.20–1.20)
Total Protein: 7.6 g/dL (ref 6.4–8.3)

## 2016-06-22 MED ORDER — FULVESTRANT 250 MG/5ML IM SOLN
500.0000 mg | INTRAMUSCULAR | Status: DC
  Administered 2016-06-22: 500 mg via INTRAMUSCULAR
  Filled 2016-06-22: qty 10

## 2016-06-22 NOTE — Patient Instructions (Signed)

## 2016-07-19 ENCOUNTER — Other Ambulatory Visit: Payer: Self-pay

## 2016-07-19 DIAGNOSIS — C50411 Malignant neoplasm of upper-outer quadrant of right female breast: Secondary | ICD-10-CM

## 2016-07-19 DIAGNOSIS — Z170 Estrogen receptor positive status [ER+]: Principal | ICD-10-CM

## 2016-07-20 ENCOUNTER — Other Ambulatory Visit (HOSPITAL_BASED_OUTPATIENT_CLINIC_OR_DEPARTMENT_OTHER): Payer: BLUE CROSS/BLUE SHIELD

## 2016-07-20 ENCOUNTER — Ambulatory Visit (HOSPITAL_BASED_OUTPATIENT_CLINIC_OR_DEPARTMENT_OTHER): Payer: BLUE CROSS/BLUE SHIELD

## 2016-07-20 VITALS — BP 137/90 | HR 92 | Temp 97.5°F | Resp 18

## 2016-07-20 DIAGNOSIS — C50411 Malignant neoplasm of upper-outer quadrant of right female breast: Secondary | ICD-10-CM

## 2016-07-20 DIAGNOSIS — Z5111 Encounter for antineoplastic chemotherapy: Secondary | ICD-10-CM | POA: Diagnosis not present

## 2016-07-20 DIAGNOSIS — Z170 Estrogen receptor positive status [ER+]: Secondary | ICD-10-CM

## 2016-07-20 DIAGNOSIS — M8580 Other specified disorders of bone density and structure, unspecified site: Secondary | ICD-10-CM

## 2016-07-20 LAB — COMPREHENSIVE METABOLIC PANEL
ALT: 27 U/L (ref 0–55)
ANION GAP: 9 meq/L (ref 3–11)
AST: 20 U/L (ref 5–34)
Albumin: 4.2 g/dL (ref 3.5–5.0)
Alkaline Phosphatase: 76 U/L (ref 40–150)
BILIRUBIN TOTAL: 0.31 mg/dL (ref 0.20–1.20)
BUN: 13.7 mg/dL (ref 7.0–26.0)
CHLORIDE: 106 meq/L (ref 98–109)
CO2: 27 meq/L (ref 22–29)
CREATININE: 0.7 mg/dL (ref 0.6–1.1)
Calcium: 9.8 mg/dL (ref 8.4–10.4)
EGFR: >90 mL/min/{1.73_m2} (ref >90)
GLUCOSE: 94 mg/dL (ref 70–140)
Potassium: 3.9 mEq/L (ref 3.5–5.1)
SODIUM: 142 meq/L (ref 136–145)
TOTAL PROTEIN: 7.3 g/dL (ref 6.4–8.3)

## 2016-07-20 LAB — TECHNOLOGIST REVIEW

## 2016-07-20 LAB — CBC WITH DIFFERENTIAL/PLATELET
BASO%: 1.6 % (ref 0.0–2.0)
Basophils Absolute: 0.1 10*3/uL (ref 0.0–0.1)
EOS%: 3.9 % (ref 0.0–7.0)
Eosinophils Absolute: 0.2 10*3/uL (ref 0.0–0.5)
HEMATOCRIT: 39.9 % (ref 34.8–46.6)
HGB: 13.3 g/dL (ref 11.6–15.9)
LYMPH#: 1.5 10*3/uL (ref 0.9–3.3)
LYMPH%: 38.4 % (ref 14.0–49.7)
MCH: 30 pg (ref 25.1–34.0)
MCHC: 33.3 g/dL (ref 31.5–36.0)
MCV: 89.9 fL (ref 79.5–101.0)
MONO#: 0.3 10*3/uL (ref 0.1–0.9)
MONO%: 7.5 % (ref 0.0–14.0)
NEUT%: 48.6 % (ref 38.4–76.8)
NEUTROS ABS: 1.9 10*3/uL (ref 1.5–6.5)
PLATELETS: 290 10*3/uL (ref 145–400)
RBC: 4.44 10*6/uL (ref 3.70–5.45)
RDW: 13 % (ref 11.2–14.5)
WBC: 3.9 10*3/uL (ref 3.9–10.3)

## 2016-07-20 MED ORDER — FULVESTRANT 250 MG/5ML IM SOLN
500.0000 mg | INTRAMUSCULAR | Status: DC
  Administered 2016-07-20: 500 mg via INTRAMUSCULAR
  Filled 2016-07-20: qty 10

## 2016-07-20 NOTE — Patient Instructions (Signed)

## 2016-08-16 ENCOUNTER — Other Ambulatory Visit: Payer: Self-pay | Admitting: Adult Health

## 2016-08-16 DIAGNOSIS — C50411 Malignant neoplasm of upper-outer quadrant of right female breast: Secondary | ICD-10-CM

## 2016-08-16 DIAGNOSIS — Z170 Estrogen receptor positive status [ER+]: Principal | ICD-10-CM

## 2016-08-17 ENCOUNTER — Other Ambulatory Visit (HOSPITAL_BASED_OUTPATIENT_CLINIC_OR_DEPARTMENT_OTHER): Payer: BLUE CROSS/BLUE SHIELD

## 2016-08-17 ENCOUNTER — Ambulatory Visit (HOSPITAL_BASED_OUTPATIENT_CLINIC_OR_DEPARTMENT_OTHER): Payer: BLUE CROSS/BLUE SHIELD

## 2016-08-17 VITALS — BP 110/71 | HR 91 | Temp 97.4°F | Resp 18

## 2016-08-17 DIAGNOSIS — Z5111 Encounter for antineoplastic chemotherapy: Secondary | ICD-10-CM | POA: Diagnosis not present

## 2016-08-17 DIAGNOSIS — C50411 Malignant neoplasm of upper-outer quadrant of right female breast: Secondary | ICD-10-CM

## 2016-08-17 DIAGNOSIS — Z170 Estrogen receptor positive status [ER+]: Principal | ICD-10-CM

## 2016-08-17 DIAGNOSIS — M8580 Other specified disorders of bone density and structure, unspecified site: Secondary | ICD-10-CM

## 2016-08-17 LAB — COMPREHENSIVE METABOLIC PANEL
ALBUMIN: 4.1 g/dL (ref 3.5–5.0)
ALK PHOS: 83 U/L (ref 40–150)
ALT: 49 U/L (ref 0–55)
ANION GAP: 9 meq/L (ref 3–11)
AST: 39 U/L — ABNORMAL HIGH (ref 5–34)
BUN: 12.4 mg/dL (ref 7.0–26.0)
CALCIUM: 10.5 mg/dL — AB (ref 8.4–10.4)
CHLORIDE: 105 meq/L (ref 98–109)
CO2: 27 mEq/L (ref 22–29)
Creatinine: 0.9 mg/dL (ref 0.6–1.1)
EGFR: 70 mL/min/{1.73_m2} — AB (ref >90)
Glucose: 92 mg/dl (ref 70–140)
POTASSIUM: 4.7 meq/L (ref 3.5–5.1)
Sodium: 140 mEq/L (ref 136–145)
Total Bilirubin: 0.59 mg/dL (ref 0.20–1.20)
Total Protein: 7.4 g/dL (ref 6.4–8.3)

## 2016-08-17 LAB — CBC WITH DIFFERENTIAL/PLATELET
BASO%: 0.2 % (ref 0.0–2.0)
Basophils Absolute: 0 10*3/uL (ref 0.0–0.1)
EOS ABS: 0.2 10*3/uL (ref 0.0–0.5)
EOS%: 4.1 % (ref 0.0–7.0)
HEMATOCRIT: 42.4 % (ref 34.8–46.6)
HEMOGLOBIN: 14.1 g/dL (ref 11.6–15.9)
LYMPH#: 1.2 10*3/uL (ref 0.9–3.3)
LYMPH%: 32.1 % (ref 14.0–49.7)
MCH: 30.2 pg (ref 25.1–34.0)
MCHC: 33.2 g/dL (ref 31.5–36.0)
MCV: 90.8 fL (ref 79.5–101.0)
MONO#: 0.3 10*3/uL (ref 0.1–0.9)
MONO%: 8 % (ref 0.0–14.0)
NEUT#: 2 10*3/uL (ref 1.5–6.5)
NEUT%: 55.6 % (ref 38.4–76.8)
PLATELETS: 271 10*3/uL (ref 145–400)
RBC: 4.66 10*6/uL (ref 3.70–5.45)
RDW: 13.8 % (ref 11.2–14.5)
WBC: 3.7 10*3/uL — ABNORMAL LOW (ref 3.9–10.3)

## 2016-08-17 MED ORDER — FULVESTRANT 250 MG/5ML IM SOLN
500.0000 mg | INTRAMUSCULAR | Status: DC
  Administered 2016-08-17: 500 mg via INTRAMUSCULAR
  Filled 2016-08-17: qty 10

## 2016-09-13 ENCOUNTER — Other Ambulatory Visit: Payer: Self-pay | Admitting: Emergency Medicine

## 2016-09-13 DIAGNOSIS — Z170 Estrogen receptor positive status [ER+]: Principal | ICD-10-CM

## 2016-09-13 DIAGNOSIS — C50411 Malignant neoplasm of upper-outer quadrant of right female breast: Secondary | ICD-10-CM

## 2016-09-13 MED ORDER — CHOLECALCIFEROL 25 MCG (1000 UT) PO CAPS: 1000.0000 [IU] | ORAL_CAPSULE | Freq: Every day | ORAL | 3 refills | Status: DC

## 2016-09-14 ENCOUNTER — Ambulatory Visit (HOSPITAL_BASED_OUTPATIENT_CLINIC_OR_DEPARTMENT_OTHER): Payer: BLUE CROSS/BLUE SHIELD

## 2016-09-14 ENCOUNTER — Other Ambulatory Visit (HOSPITAL_BASED_OUTPATIENT_CLINIC_OR_DEPARTMENT_OTHER): Payer: BLUE CROSS/BLUE SHIELD

## 2016-09-14 VITALS — BP 124/77 | HR 86 | Temp 97.4°F | Resp 20

## 2016-09-14 DIAGNOSIS — M8580 Other specified disorders of bone density and structure, unspecified site: Secondary | ICD-10-CM

## 2016-09-14 DIAGNOSIS — Z5111 Encounter for antineoplastic chemotherapy: Secondary | ICD-10-CM | POA: Diagnosis not present

## 2016-09-14 DIAGNOSIS — Z170 Estrogen receptor positive status [ER+]: Principal | ICD-10-CM

## 2016-09-14 DIAGNOSIS — C50411 Malignant neoplasm of upper-outer quadrant of right female breast: Secondary | ICD-10-CM

## 2016-09-14 LAB — CBC WITH DIFFERENTIAL/PLATELET
BASO%: 1.4 % (ref 0.0–2.0)
Basophils Absolute: 0.1 10*3/uL (ref 0.0–0.1)
EOS%: 3.9 % (ref 0.0–7.0)
Eosinophils Absolute: 0.2 10*3/uL (ref 0.0–0.5)
HEMATOCRIT: 43.9 % (ref 34.8–46.6)
HGB: 14.7 g/dL (ref 11.6–15.9)
LYMPH#: 1.4 10*3/uL (ref 0.9–3.3)
LYMPH%: 33.8 % (ref 14.0–49.7)
MCH: 30 pg (ref 25.1–34.0)
MCHC: 33.4 g/dL (ref 31.5–36.0)
MCV: 89.6 fL (ref 79.5–101.0)
MONO#: 0.4 10*3/uL (ref 0.1–0.9)
MONO%: 10.2 % (ref 0.0–14.0)
NEUT%: 50.7 % (ref 38.4–76.8)
NEUTROS ABS: 2.1 10*3/uL (ref 1.5–6.5)
PLATELETS: 262 10*3/uL (ref 145–400)
RBC: 4.89 10*6/uL (ref 3.70–5.45)
RDW: 13.5 % (ref 11.2–14.5)
WBC: 4.1 10*3/uL (ref 3.9–10.3)

## 2016-09-14 LAB — COMPREHENSIVE METABOLIC PANEL
ALT: 18 U/L (ref 0–55)
ANION GAP: 12 meq/L — AB (ref 3–11)
AST: 15 U/L (ref 5–34)
Albumin: 4.3 g/dL (ref 3.5–5.0)
Alkaline Phosphatase: 88 U/L (ref 40–150)
BILIRUBIN TOTAL: 0.52 mg/dL (ref 0.20–1.20)
BUN: 12.8 mg/dL (ref 7.0–26.0)
CALCIUM: 10.3 mg/dL (ref 8.4–10.4)
CO2: 26 meq/L (ref 22–29)
CREATININE: 0.8 mg/dL (ref 0.6–1.1)
Chloride: 101 mEq/L (ref 98–109)
EGFR: 89 mL/min/{1.73_m2} — ABNORMAL LOW (ref >90)
Glucose: 88 mg/dl (ref 70–140)
Potassium: 4.2 mEq/L (ref 3.5–5.1)
Sodium: 139 mEq/L (ref 136–145)
TOTAL PROTEIN: 7.4 g/dL (ref 6.4–8.3)

## 2016-09-14 MED ORDER — FULVESTRANT 250 MG/5ML IM SOLN
500.0000 mg | INTRAMUSCULAR | Status: DC
  Administered 2016-09-14: 500 mg via INTRAMUSCULAR
  Filled 2016-09-14: qty 10

## 2016-09-14 NOTE — Patient Instructions (Signed)

## 2016-10-11 ENCOUNTER — Other Ambulatory Visit: Payer: Self-pay

## 2016-10-11 DIAGNOSIS — Z170 Estrogen receptor positive status [ER+]: Principal | ICD-10-CM

## 2016-10-11 DIAGNOSIS — C50411 Malignant neoplasm of upper-outer quadrant of right female breast: Secondary | ICD-10-CM

## 2016-10-12 ENCOUNTER — Ambulatory Visit (HOSPITAL_BASED_OUTPATIENT_CLINIC_OR_DEPARTMENT_OTHER): Payer: BLUE CROSS/BLUE SHIELD

## 2016-10-12 ENCOUNTER — Other Ambulatory Visit (HOSPITAL_BASED_OUTPATIENT_CLINIC_OR_DEPARTMENT_OTHER): Payer: BLUE CROSS/BLUE SHIELD

## 2016-10-12 VITALS — BP 104/76 | HR 86 | Temp 97.9°F | Resp 18

## 2016-10-12 DIAGNOSIS — M8580 Other specified disorders of bone density and structure, unspecified site: Secondary | ICD-10-CM

## 2016-10-12 DIAGNOSIS — Z5111 Encounter for antineoplastic chemotherapy: Secondary | ICD-10-CM | POA: Diagnosis not present

## 2016-10-12 DIAGNOSIS — C50411 Malignant neoplasm of upper-outer quadrant of right female breast: Secondary | ICD-10-CM

## 2016-10-12 DIAGNOSIS — Z170 Estrogen receptor positive status [ER+]: Secondary | ICD-10-CM

## 2016-10-12 LAB — COMPREHENSIVE METABOLIC PANEL
ALT: 14 U/L (ref 0–55)
ANION GAP: 10 meq/L (ref 3–11)
AST: 16 U/L (ref 5–34)
Albumin: 4 g/dL (ref 3.5–5.0)
Alkaline Phosphatase: 76 U/L (ref 40–150)
BILIRUBIN TOTAL: 0.48 mg/dL (ref 0.20–1.20)
BUN: 14.9 mg/dL (ref 7.0–26.0)
CHLORIDE: 103 meq/L (ref 98–109)
CO2: 28 meq/L (ref 22–29)
CREATININE: 0.7 mg/dL (ref 0.6–1.1)
Calcium: 10.4 mg/dL (ref 8.4–10.4)
EGFR: >90 mL/min/{1.73_m2} (ref >90)
GLUCOSE: 90 mg/dL (ref 70–140)
Potassium: 4.1 mEq/L (ref 3.5–5.1)
SODIUM: 140 meq/L (ref 136–145)
TOTAL PROTEIN: 7.2 g/dL (ref 6.4–8.3)

## 2016-10-12 LAB — CBC WITH DIFFERENTIAL/PLATELET
BASO%: 0.7 % (ref 0.0–2.0)
Basophils Absolute: 0 10*3/uL (ref 0.0–0.1)
EOS%: 6.2 % (ref 0.0–7.0)
Eosinophils Absolute: 0.2 10*3/uL (ref 0.0–0.5)
HCT: 41.8 % (ref 34.8–46.6)
HGB: 13.8 g/dL (ref 11.6–15.9)
LYMPH%: 41.7 % (ref 14.0–49.7)
MCH: 30.1 pg (ref 25.1–34.0)
MCHC: 33 g/dL (ref 31.5–36.0)
MCV: 91.3 fL (ref 79.5–101.0)
MONO#: 0.4 10*3/uL (ref 0.1–0.9)
MONO%: 12.1 % (ref 0.0–14.0)
NEUT%: 39.3 % (ref 38.4–76.8)
NEUTROS ABS: 1.1 10*3/uL — AB (ref 1.5–6.5)
PLATELETS: 233 10*3/uL (ref 145–400)
RBC: 4.58 10*6/uL (ref 3.70–5.45)
RDW: 13.5 % (ref 11.2–14.5)
WBC: 2.9 10*3/uL — AB (ref 3.9–10.3)
lymph#: 1.2 10*3/uL (ref 0.9–3.3)

## 2016-10-12 MED ORDER — FULVESTRANT 250 MG/5ML IM SOLN
500.0000 mg | INTRAMUSCULAR | Status: DC
  Administered 2016-10-12: 500 mg via INTRAMUSCULAR
  Filled 2016-10-12: qty 10

## 2016-10-12 NOTE — Patient Instructions (Signed)

## 2016-11-07 ENCOUNTER — Other Ambulatory Visit: Payer: Self-pay

## 2016-11-07 MED ORDER — VENLAFAXINE HCL ER 75 MG PO CP24: 75.0000 mg | ORAL_CAPSULE | Freq: Every day | ORAL | 4 refills | Status: DC

## 2016-11-08 ENCOUNTER — Other Ambulatory Visit: Payer: Self-pay

## 2016-11-08 DIAGNOSIS — Z170 Estrogen receptor positive status [ER+]: Principal | ICD-10-CM

## 2016-11-08 DIAGNOSIS — C50411 Malignant neoplasm of upper-outer quadrant of right female breast: Secondary | ICD-10-CM

## 2016-11-09 ENCOUNTER — Other Ambulatory Visit (HOSPITAL_BASED_OUTPATIENT_CLINIC_OR_DEPARTMENT_OTHER): Payer: BLUE CROSS/BLUE SHIELD

## 2016-11-09 ENCOUNTER — Ambulatory Visit (HOSPITAL_BASED_OUTPATIENT_CLINIC_OR_DEPARTMENT_OTHER): Payer: BLUE CROSS/BLUE SHIELD | Admitting: Oncology

## 2016-11-09 ENCOUNTER — Ambulatory Visit (HOSPITAL_BASED_OUTPATIENT_CLINIC_OR_DEPARTMENT_OTHER): Payer: BLUE CROSS/BLUE SHIELD

## 2016-11-09 VITALS — BP 115/80 | HR 94 | Temp 98.1°F | Resp 18 | Ht 67.0 in | Wt 179.3 lb

## 2016-11-09 DIAGNOSIS — M8580 Other specified disorders of bone density and structure, unspecified site: Secondary | ICD-10-CM

## 2016-11-09 DIAGNOSIS — Z79811 Long term (current) use of aromatase inhibitors: Secondary | ICD-10-CM

## 2016-11-09 DIAGNOSIS — C50411 Malignant neoplasm of upper-outer quadrant of right female breast: Secondary | ICD-10-CM

## 2016-11-09 DIAGNOSIS — Z170 Estrogen receptor positive status [ER+]: Secondary | ICD-10-CM

## 2016-11-09 DIAGNOSIS — Z5111 Encounter for antineoplastic chemotherapy: Secondary | ICD-10-CM | POA: Diagnosis not present

## 2016-11-09 LAB — COMPREHENSIVE METABOLIC PANEL
ALK PHOS: 86 U/L (ref 40–150)
ALT: 17 U/L (ref 0–55)
ANION GAP: 10 meq/L (ref 3–11)
AST: 17 U/L (ref 5–34)
Albumin: 4.2 g/dL (ref 3.5–5.0)
BILIRUBIN TOTAL: 0.65 mg/dL (ref 0.20–1.20)
BUN: 9.4 mg/dL (ref 7.0–26.0)
CALCIUM: 10.1 mg/dL (ref 8.4–10.4)
CO2: 26 mEq/L (ref 22–29)
CREATININE: 0.8 mg/dL (ref 0.6–1.1)
Chloride: 104 mEq/L (ref 98–109)
EGFR: 86 mL/min/{1.73_m2} — AB (ref >90)
Glucose: 93 mg/dl (ref 70–140)
POTASSIUM: 4 meq/L (ref 3.5–5.1)
Sodium: 139 mEq/L (ref 136–145)
Total Protein: 7.5 g/dL (ref 6.4–8.3)

## 2016-11-09 LAB — CBC WITH DIFFERENTIAL/PLATELET
BASO%: 1.2 % (ref 0.0–2.0)
BASOS ABS: 0.1 10*3/uL (ref 0.0–0.1)
EOS%: 4.1 % (ref 0.0–7.0)
Eosinophils Absolute: 0.2 10*3/uL (ref 0.0–0.5)
HEMATOCRIT: 42.7 % (ref 34.8–46.6)
HGB: 14.2 g/dL (ref 11.6–15.9)
LYMPH%: 29.2 % (ref 14.0–49.7)
MCH: 29.8 pg (ref 25.1–34.0)
MCHC: 33.2 g/dL (ref 31.5–36.0)
MCV: 89.8 fL (ref 79.5–101.0)
MONO#: 0.4 10*3/uL (ref 0.1–0.9)
MONO%: 8.2 % (ref 0.0–14.0)
NEUT#: 2.5 10*3/uL (ref 1.5–6.5)
NEUT%: 57.3 % (ref 38.4–76.8)
PLATELETS: 278 10*3/uL (ref 145–400)
RBC: 4.76 10*6/uL (ref 3.70–5.45)
RDW: 13.4 % (ref 11.2–14.5)
WBC: 4.3 10*3/uL (ref 3.9–10.3)
lymph#: 1.3 10*3/uL (ref 0.9–3.3)

## 2016-11-09 MED ORDER — VENLAFAXINE HCL ER 37.5 MG PO CP24: 75.0000 mg | ORAL_CAPSULE | Freq: Every day | ORAL | 3 refills | Status: DC

## 2016-11-09 MED ORDER — FULVESTRANT 250 MG/5ML IM SOLN
500.0000 mg | INTRAMUSCULAR | Status: DC
  Administered 2016-11-09: 500 mg via INTRAMUSCULAR
  Filled 2016-11-09: qty 10

## 2016-11-09 NOTE — Progress Notes (Signed)
[place]  [phone] [phone]     ID: Elizabeth Miles DOB: November 05, 1965  MR#: [id number]  IYM#:[id number]  Patient Care Team: Clinton Quant, MD as PCP - General (Internal Medicine) Pomposini, Cherly Anderson, MD as Referring Physician (Internal Medicine) Rolm Bookbinder, MD as Consulting Physician (General Surgery) Magrinat, Virgie Dad, MD as Consulting Physician (Oncology) Delice Bison, Charlestine Massed, NP as Nurse Practitioner (Hematology and Oncology) GYN: Servando Salina MD  CHIEF COMPLAINT: right breat cancer  CURRENT TREATMENT: Fulvestrant, [denosumab/Prolia]  BREAST CANCER HISTORY: From Dr. Dana Allan earlier notes:  "Elizabeth Miles is a 51 y.o. female. Without significant past medical history who on June 2013 had a mammogram that was normal. But there was on physical exam possibility of a cyst noted in the right breast. The mammogram was negative. In 2014 June patient noted on exam another lump in the right breast. She underwent a diagnostic mammogram on June 10 that showed a right breast nodule in the outer quadrant. She had an ultrasound performed that showed at the 9:30 o'clock position a 3.6 cm area and then added 10:00 position 1.3 cm area with a total area being anywhere between 5-6 cm. The patient went on to have a right breast biopsy performed in [place]. The pathology revealed an invasive ductal carcinoma. This is been confirmed by our pathology as well. The carcinoma and papillary features and was felt to be between a grade 1 and 2. The tumor was estrogen receptor positive strongly (100%) progesterone receptor negative HER-2/neu negative with a Ki-67 that showed a high proliferation rate. Patient is now seen in medical oncology for discussion of treatment options."  Bilateral breast MRI on 08/27/2012 revealed in the right upper quadrant irregular lobulated mass with a satellite nodule or lobulation within 2 mm at its superior aspect,  measured together as 4.5 x 4.0 x 3.8 cm. There was extension of enhancement to the nipple, suggesting nipple involvement may be present. No lymphadenopathy was noted there was no any other area of abnormal enhancement in either breast (clinical stage IIA, T2 N0).  PET scan performed on 08/29/2012 revealed the primary breast cancer measuring 3.2 x 3.3 cm with SUV of 16. It was adjacent nodule along the superiomedial border of the primary mass measuring 1.4 cm which was also hypermetabolic. There were no additional areas of abnormal hypermetabolism. No abnormal hypermetabolic activity was seen in the chest, abdomen/pelvis,within the liver, pancreas, adrenal glands or spleen. No hypermetabolic lymph nodes. In the skeleton, no focal hypermetabolic activity to suggest skeletal metastasis was seen.  Completed neoadjuvant chemotherapy consisting of Q14 day Adriamycin/Cytoxan x 4 cycles on 10/25/2012, followed by one dose of neoadjuvant Taxol on 11/08/2012. She developed significant grade 2 neuropathy and Taxol was discontinued. Then received neoadjuvant chemotherapy consisting of single agent Abraxane given on day 1, 8, 15 of each 28 day cycle. She completed therapy on 11/15/12 - 02/14/13.   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 Repeat HER-2 testing was now positive. The patient was started on tamoxifen March 2015 and on Herceptin the same month.  Her subsequent history is as detailed below.  INTERVAL HISTORY: Elizabeth Miles returns today for follow-up and treatment of her stage IIestrogen receptor positive breast canceraccompanied by her mother. She continues on fulvestrant. Pt  is doing okay with her treatment and denies any pain. She notes the only side  affect is a change in taste and smell. She is unable to taste food, however, she is able to taste the  medication in her mouth and notes it intermittently tastes like urine. She has tried using biotin without relief. Atloids temporarily alleviate the bad taste. In order to tolerate water because of this side affect, she has been mixing sugar free drink mix powders in with her water. The change to her smell will last about a week.    REVIEW OF SYSTEMS:  Pt reports that her right knee and hip pain has improved with a homemade cream that was originally made to help spider veins. She has been walking on the treadmill for ~ 30 minutes, 3 times a week. Pt has lost about 44 pounds from exercising and not longer eating bread, pasta, rice, or potatoes. She nisonly drinking water. She is very careful counting calories and avoids carbs of all types. She denies unusual headaches, visual changes, nausea, vomiting, or dizziness. There has been no unusual cough, phlegm production, or pleurisy. This been no change in bowel or bladder habits. She denies unexplained fatigue  bleeding, rash, or fever. A detailed review of systems was otherwise entirely negative.    PAST MEDICAL HISTORY: Past Medical History:  Diagnosis Date  . Allergy   . Anxiety   . Breast cancer ([place]) 08/06/12   invasive ductal carcioma  . Complication of anesthesia    1986 ; problem waking up  . GERD (gastroesophageal reflux disease)   . GERD (gastroesophageal reflux disease) 08/22/2012  . Wears glasses     PAST SURGICAL HISTORY: Past Surgical History:  Procedure Laterality Date  . BREAST BIOPSY Right 08/06/12  . BREAST LUMPECTOMY WITH NEEDLE LOCALIZATION AND AXILLARY SENTINEL LYMPH NODE BX Right 03/10/2013   Procedure: BREAST LUMPECTOMY WITH NEEDLE LOCALIZATION AND AXILLARY SENTINEL LYMPH NODE BIOPSY;  Surgeon: Rolm Bookbinder, MD;  Location: [facility];  Service: General;  Laterality: Right;  . [place]  . DILATION AND CURETTAGE OF UTERUS  1993  . POPLITEAL SYNOVIAL CYST EXCISION  1970  . PORT-A-CATH  REMOVAL Left 03/10/2013   Procedure: REMOVAL PORT-A-CATH;  Surgeon: Rolm Bookbinder, MD;  Location: [facility];  Service: General;  Laterality: Left;  . PORTACATH PLACEMENT Left 09/12/2012   Procedure: INSERTION PORT-A-CATH;  Surgeon: Rolm Bookbinder, MD;  Location[facility] OR;  Service: General;  Laterality: Left;    FAMILY HISTORY Family History  Problem Relation Age of Onset  . Hypertension Mother   . Bladder Cancer Maternal Uncle 68  . Cancer Paternal Grandmother 36       lung cancer  . Lung cancer Paternal Grandfather        dx in his 54s  . COPD Paternal Uncle    the patient's parents are living and in good health. The patient has one brother, no sisters. There is no history of breast or ovarian cancer in the family to her knowledge  GYNECOLOGIC HISTORY:  Patient's last menstrual period was 03/12/2012. Menarche age 83, first live birth age 48. The patient is GX P3. 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 Her husband, Jori Moll, is a Tour manager. A daughter, Lovena Le,  lives in [place] and daughter Caryl Pina has a baby girl, the patient's first grandchild, 2 years old as of December 2017; she stays with Coralyn Mark frequently.. Son Jori Moll "Brianne" is 13 as  of December 2017 and at home. The patient is a Psychologist, forensic.    ADVANCED DIRECTIVES: Not in place   HEALTH MAINTENANCE: Social History  Substance Use Topics  . Smoking status: Never Smoker  . Smokeless tobacco: Never Used  . Alcohol use No     Colonoscopy:  PAP:  Bone density:  Lipid panel:  Allergies  Allergen Reactions  . Anastrozole Anaphylaxis  . Darvocet [Propoxyphene N-Acetaminophen] Shortness Of Breath and Swelling  . Shellfish Allergy Shortness Of Breath and Swelling  . Other     CT dye, "chest hurt" "makes me feel cold"    Current Outpatient  Prescriptions  Medication Sig Dispense Refill  . acetaminophen (TYLENOL) 500 MG tablet Take 1,000 mg by mouth every 6 (six) hours as needed. Reported on 06/14/2015    . aspirin 325 MG tablet Take 81 mg by mouth daily.    . cetirizine (ZYRTEC) 10 MG tablet Take 10 mg by mouth daily.    . Cholecalciferol ([facility] D3) 1000 units capsule Take 1 capsule (1,000 Units total) by mouth daily. 90 capsule 3  . Dexlansoprazole (DEXILANT) 30 MG capsule Take 1 capsule (30 mg total) by mouth daily. 90 capsule 4  . ibuprofen (ADVIL,MOTRIN) 800 MG tablet Take 800 mg by mouth every 8 (eight) hours as needed for moderate pain (for back pain). Reported on 06/14/2015    . polyethylene glycol (MIRALAX / GLYCOLAX) packet Take 17 g by mouth daily.    Marland Kitchen venlafaxine XR (EFFEXOR-XR) 37.5 MG 24 hr capsule Take 2 capsules (75 mg total) by mouth daily with breakfast. 90 capsule 3  . venlafaxine XR (EFFEXOR-XR) 75 MG 24 hr capsule Take 1 capsule (75 mg total) by mouth daily with breakfast. 90 capsule 4   No current facility-administered medications for this visit.     OBJECTIVE: Middle-aged white woman appears well  Vitals:   11/09/16 1015  BP: 115/80  Pulse: 94  Resp: 18  Temp: 98.1 F (36.7 C)  SpO2: 99%     Body mass index is 28.08 kg/m.    ECOG FS:1 - Symptomatic but completely ambulatory Filed Weights   11/09/16 1015  Weight: 179 lb 4.8 oz (81.3 kg)    Sclerae unicteric, EOMs intact Oropharynx clear and moist No cervical or supraclavicular adenopathy Lungs no rales or rhonchi Heart regular rate and rhythm Abd soft, nontender, positive bowel sounds MSK no focal spinal tenderness, no upper extremity lymphedema Neuro: nonfocal, well oriented, appropriate affect Breasts: The right breast is status post lumpectomy followed by radiation, with no evidence of local recurrence. The left breast is benign. Both axillae are benign.  LAB RESULTS:  CMP     Component Value Date/Time   NA 140 10/12/2016 0956   K 4.1  10/12/2016 0956   CL 105 08/17/2013 0346   CO2 28 10/12/2016 0956   GLUCOSE 90 10/12/2016 0956   BUN 14.9 10/12/2016 0956   CREATININE 0.7 10/12/2016 0956   CALCIUM 10.4 10/12/2016 0956   PROT 7.2 10/12/2016 0956   ALBUMIN 4.0 10/12/2016 0956   AST 16 10/12/2016 0956   ALT 14 10/12/2016 0956   ALKPHOS 76 10/12/2016 0956   BILITOT 0.48 10/12/2016 0956    I No results found for: SPEP  Lab Results  Component Value Date   WBC 4.3 11/09/2016   NEUTROABS 2.5 11/09/2016   HGB 14.2 11/09/2016   HCT 42.7 11/09/2016   MCV 89.8 11/09/2016   PLT 278 11/09/2016      Chemistry  Component Value Date/Time   NA 140 10/12/2016 0956   K 4.1 10/12/2016 0956   CL 105 08/17/2013 0346   CO2 28 10/12/2016 0956   BUN 14.9 10/12/2016 0956   CREATININE 0.7 10/12/2016 0956      Component Value Date/Time   CALCIUM 10.4 10/12/2016 0956   ALKPHOS 76 10/12/2016 0956   AST 16 10/12/2016 0956   ALT 14 10/12/2016 0956   BILITOT 0.48 10/12/2016 0956       No results found for: LABCA2  No components found for: LABCA125  No results for input(s): INR in the last 168 hours.  Urinalysis    Component Value Date/Time   LABSPEC 1.020 09/28/2015 1224   PHURINE 5.0 09/28/2015 1224   GLUCOSEU Negative 09/28/2015 1224   HGBUR Negative 09/28/2015 1224   BILIRUBINUR Negative 09/28/2015 1224   KETONESUR Negative 09/28/2015 1224   PROTEINUR Negative 09/28/2015 1224   UROBILINOGEN 0.2 09/28/2015 1224   NITRITE Negative 09/28/2015 1224   LEUKOCYTESUR Negative 09/28/2015 1224    STUDIES:  I believe she will be due for repeat mammography at [facility] favored 2019  ASSESSMENT: 51 y.o. [place] woman  (1) status post right breast upper outer quadrant biopsy 08/06/2012 for a clinical mT2 N0, stage IIA invasive ductal carcinoma, estrogen receptor 3+ positive, progesterone receptor and HER-2 negative, Ki67 3+ [[id number]]  (2) treated neoadjuvantly with dose dense cyclophosphamide and  doxorubicin x4, completed 10/25/2012, followed by a single dose of weekly paclitaxel, poorly tolerated; followed by 11 doses of Abraxane completed 02/14/2013  (3) status post right lumpectomy and sentinel lymph node sampling 03/10/2013 for a ypT2 pN0, stage IIA invasive ductal carcinoma, grade 2, estrogen receptor 100% positive, progesterone receptor 21% positive, with an MIB-1 of 14%, and HER-2 amplified, the signals ratio being 2.52, the number per cell 2.90  (a) reclassified as stage IB in the 2018 prognostic classification  (4) adjuvant radiation completed March 2015 (?)  (5) started tamoxifen March 2015, stopped 05/25/14 because of superficial clot to left cephalic vein; began anastrozole on 05/29/14, discontinued after 2 weeks with rash; started letrozole 05/24/2015, discontinued after 5 days use because of angioedema-like symptoms  (6) started trastuzumab 05/23/2013, completed 05/15/14  (7) osteopenia: With T score of -1.7 on bone density scan at [facility] 06/10/2014.   (a) with dental clearance started denosumab/Prolia 12/09/2015  (b) denosumab/Proliadiscontinued after October 2017 dose at patient's request  (8) fulvestrant started 06/23/2015  PLAN: Elizabeth Miles is now3-1/2 years out from definitive surgery for her breast cancer with no evidence of disease recurrence. This is very favorable.  She is tolerating the fulvestrant moderately well. I wish I could resolve or at least alleviate the taste problem that she is experiencing. Part of it of course is just the residual change in taste from chemotherapy, but she does seem to have some excretion of the medication into her saliva, perhaps, causing her very bad sense of taste and smell for about a week after each fulvestrant dose.  I recommended she try out to weights or similar pills (she is very keen on avoiding any carbohydrates and output apparently do have some carbohydrates) and also to talk to her dentist and see if her dentist as any additional  ideas.  Otherwise the plan is to continue fulvestrant until she completes a total of 5 years of anti-estrogens. Recall that she had one year of tamoxifen before starting fulvestrant April 2017 so she will continue fulvestrant through April 2021  She will see me again in 6 months.  She will have her next mammogram in February 2019 at [facility]  She knows to call for any other problems that may develop before that visit.  Magrinat, Virgie Dad, MD  11/09/16 10:36 AM Medical Oncology and Hematology [facility] [address] [place], [place] [address] Tel. [phone]    Fax. [phone]  This document serves as a record of services personally performed by Chauncey Cruel, MD. It was created on her behalf by Margit Banda, a trained medical scribe. The creation of this record is based on the scribe's personal observations and the provider's statements to them. This document has been checked and approved by the attending provider.

## 2016-11-28 ENCOUNTER — Other Ambulatory Visit: Payer: Self-pay | Admitting: *Deleted

## 2016-11-30 ENCOUNTER — Other Ambulatory Visit: Payer: Self-pay | Admitting: Adult Health

## 2016-11-30 ENCOUNTER — Telehealth: Payer: Self-pay

## 2016-11-30 DIAGNOSIS — C50411 Malignant neoplasm of upper-outer quadrant of right female breast: Secondary | ICD-10-CM

## 2016-11-30 DIAGNOSIS — Z170 Estrogen receptor positive status [ER+]: Principal | ICD-10-CM

## 2016-11-30 MED ORDER — VENLAFAXINE HCL ER 150 MG PO CP24: 150.0000 mg | ORAL_CAPSULE | Freq: Every day | ORAL | 3 refills | Status: DC

## 2016-11-30 NOTE — Telephone Encounter (Signed)
Called patient to inform her that [facility]R 150 mg.

## 2016-12-04 ENCOUNTER — Telehealth: Payer: Self-pay | Admitting: Oncology

## 2016-12-04 NOTE — Telephone Encounter (Signed)
12/01/16 prescription refill. Prescription scanned in media °

## 2016-12-06 ENCOUNTER — Other Ambulatory Visit: Payer: Self-pay | Admitting: *Deleted

## 2016-12-06 DIAGNOSIS — Z170 Estrogen receptor positive status [ER+]: Principal | ICD-10-CM

## 2016-12-06 DIAGNOSIS — C50411 Malignant neoplasm of upper-outer quadrant of right female breast: Secondary | ICD-10-CM

## 2016-12-07 ENCOUNTER — Other Ambulatory Visit (HOSPITAL_BASED_OUTPATIENT_CLINIC_OR_DEPARTMENT_OTHER): Payer: BLUE CROSS/BLUE SHIELD

## 2016-12-07 ENCOUNTER — Ambulatory Visit (HOSPITAL_BASED_OUTPATIENT_CLINIC_OR_DEPARTMENT_OTHER): Payer: BLUE CROSS/BLUE SHIELD

## 2016-12-07 VITALS — BP 121/81 | HR 92 | Temp 98.0°F | Resp 16

## 2016-12-07 DIAGNOSIS — Z170 Estrogen receptor positive status [ER+]: Principal | ICD-10-CM

## 2016-12-07 DIAGNOSIS — C50411 Malignant neoplasm of upper-outer quadrant of right female breast: Secondary | ICD-10-CM

## 2016-12-07 DIAGNOSIS — Z5111 Encounter for antineoplastic chemotherapy: Secondary | ICD-10-CM

## 2016-12-07 DIAGNOSIS — M8580 Other specified disorders of bone density and structure, unspecified site: Secondary | ICD-10-CM

## 2016-12-07 LAB — CBC WITH DIFFERENTIAL/PLATELET
BASO%: 1.1 % (ref 0.0–2.0)
BASOS ABS: 0.1 10*3/uL (ref 0.0–0.1)
EOS%: 11.1 % — AB (ref 0.0–7.0)
Eosinophils Absolute: 0.5 10*3/uL (ref 0.0–0.5)
HCT: 44.2 % (ref 34.8–46.6)
HGB: 14.6 g/dL (ref 11.6–15.9)
LYMPH#: 1.5 10*3/uL (ref 0.9–3.3)
LYMPH%: 30.4 % (ref 14.0–49.7)
MCH: 30.1 pg (ref 25.1–34.0)
MCHC: 32.9 g/dL (ref 31.5–36.0)
MCV: 91.2 fL (ref 79.5–101.0)
MONO#: 0.3 10*3/uL (ref 0.1–0.9)
MONO%: 6.1 % (ref 0.0–14.0)
NEUT#: 2.5 10*3/uL (ref 1.5–6.5)
NEUT%: 51.3 % (ref 38.4–76.8)
Platelets: 324 10*3/uL (ref 145–400)
RBC: 4.85 10*6/uL (ref 3.70–5.45)
RDW: 13.5 % (ref 11.2–14.5)
WBC: 4.8 10*3/uL (ref 3.9–10.3)

## 2016-12-07 LAB — COMPREHENSIVE METABOLIC PANEL
ALT: 17 U/L (ref 0–55)
AST: 17 U/L (ref 5–34)
Albumin: 4.5 g/dL (ref 3.5–5.0)
Alkaline Phosphatase: 84 U/L (ref 40–150)
Anion Gap: 10 mEq/L (ref 3–11)
BUN: 15.1 mg/dL (ref 7.0–26.0)
CHLORIDE: 103 meq/L (ref 98–109)
CO2: 27 meq/L (ref 22–29)
CREATININE: 0.8 mg/dL (ref 0.6–1.1)
Calcium: 10.3 mg/dL (ref 8.4–10.4)
EGFR: >60 mL/min/{1.73_m2} (ref >60)
GLUCOSE: 97 mg/dL (ref 70–140)
POTASSIUM: 4.2 meq/L (ref 3.5–5.1)
SODIUM: 140 meq/L (ref 136–145)
Total Bilirubin: 0.61 mg/dL (ref 0.20–1.20)
Total Protein: 8 g/dL (ref 6.4–8.3)

## 2016-12-07 MED ORDER — FULVESTRANT 250 MG/5ML IM SOLN
500.0000 mg | INTRAMUSCULAR | Status: DC
  Administered 2016-12-07: 500 mg via INTRAMUSCULAR
  Filled 2016-12-07: qty 10

## 2016-12-21 ENCOUNTER — Other Ambulatory Visit: Payer: Self-pay | Admitting: Oncology

## 2016-12-29 ENCOUNTER — Other Ambulatory Visit: Payer: Self-pay | Admitting: *Deleted

## 2017-01-03 ENCOUNTER — Other Ambulatory Visit: Payer: Self-pay

## 2017-01-03 DIAGNOSIS — C50411 Malignant neoplasm of upper-outer quadrant of right female breast: Secondary | ICD-10-CM

## 2017-01-03 DIAGNOSIS — Z170 Estrogen receptor positive status [ER+]: Principal | ICD-10-CM

## 2017-01-04 ENCOUNTER — Ambulatory Visit (HOSPITAL_BASED_OUTPATIENT_CLINIC_OR_DEPARTMENT_OTHER): Payer: BLUE CROSS/BLUE SHIELD

## 2017-01-04 ENCOUNTER — Other Ambulatory Visit (HOSPITAL_BASED_OUTPATIENT_CLINIC_OR_DEPARTMENT_OTHER): Payer: BLUE CROSS/BLUE SHIELD

## 2017-01-04 VITALS — BP 124/86 | HR 84 | Temp 97.8°F | Resp 18

## 2017-01-04 DIAGNOSIS — C50411 Malignant neoplasm of upper-outer quadrant of right female breast: Secondary | ICD-10-CM

## 2017-01-04 DIAGNOSIS — M8580 Other specified disorders of bone density and structure, unspecified site: Secondary | ICD-10-CM

## 2017-01-04 DIAGNOSIS — Z170 Estrogen receptor positive status [ER+]: Secondary | ICD-10-CM

## 2017-01-04 DIAGNOSIS — Z5111 Encounter for antineoplastic chemotherapy: Secondary | ICD-10-CM

## 2017-01-04 LAB — CBC WITH DIFFERENTIAL/PLATELET
BASO%: 1.3 % (ref 0.0–2.0)
BASOS ABS: 0.1 10*3/uL (ref 0.0–0.1)
EOS%: 4.3 % (ref 0.0–7.0)
Eosinophils Absolute: 0.2 10*3/uL (ref 0.0–0.5)
HCT: 44 % (ref 34.8–46.6)
HGB: 14.5 g/dL (ref 11.6–15.9)
LYMPH#: 1.4 10*3/uL (ref 0.9–3.3)
LYMPH%: 28.4 % (ref 14.0–49.7)
MCH: 30.1 pg (ref 25.1–34.0)
MCHC: 33 g/dL (ref 31.5–36.0)
MCV: 91.3 fL (ref 79.5–101.0)
MONO#: 0.4 10*3/uL (ref 0.1–0.9)
MONO%: 8.3 % (ref 0.0–14.0)
NEUT#: 2.8 10*3/uL (ref 1.5–6.5)
NEUT%: 57.7 % (ref 38.4–76.8)
Platelets: 373 10*3/uL (ref 145–400)
RBC: 4.82 10*6/uL (ref 3.70–5.45)
RDW: 13.4 % (ref 11.2–14.5)
WBC: 4.9 10*3/uL (ref 3.9–10.3)

## 2017-01-04 LAB — COMPREHENSIVE METABOLIC PANEL
ALBUMIN: 4.4 g/dL (ref 3.5–5.0)
ALK PHOS: 89 U/L (ref 40–150)
ALT: 17 U/L (ref 0–55)
ANION GAP: 10 meq/L (ref 3–11)
AST: 13 U/L (ref 5–34)
BUN: 15.5 mg/dL (ref 7.0–26.0)
CO2: 27 meq/L (ref 22–29)
CREATININE: 0.8 mg/dL (ref 0.6–1.1)
Calcium: 10.1 mg/dL (ref 8.4–10.4)
Chloride: 103 mEq/L (ref 98–109)
GLUCOSE: 87 mg/dL (ref 70–140)
Potassium: 4.5 mEq/L (ref 3.5–5.1)
SODIUM: 140 meq/L (ref 136–145)
Total Bilirubin: 0.48 mg/dL (ref 0.20–1.20)
Total Protein: 8.4 g/dL — ABNORMAL HIGH (ref 6.4–8.3)

## 2017-01-04 LAB — DRAW EXTRA CLOT TUBE

## 2017-01-04 MED ORDER — FULVESTRANT 250 MG/5ML IM SOLN
500.0000 mg | INTRAMUSCULAR | Status: DC
  Administered 2017-01-04: 500 mg via INTRAMUSCULAR
  Filled 2017-01-04: qty 10

## 2017-01-04 NOTE — Patient Instructions (Signed)

## 2017-01-15 DIAGNOSIS — K353 Acute appendicitis with localized peritonitis, without perforation or gangrene: Secondary | ICD-10-CM | POA: Insufficient documentation

## 2017-01-31 ENCOUNTER — Other Ambulatory Visit: Payer: Self-pay | Admitting: *Deleted

## 2017-01-31 DIAGNOSIS — Z170 Estrogen receptor positive status [ER+]: Principal | ICD-10-CM

## 2017-01-31 DIAGNOSIS — C50411 Malignant neoplasm of upper-outer quadrant of right female breast: Secondary | ICD-10-CM

## 2017-02-01 ENCOUNTER — Other Ambulatory Visit: Payer: Self-pay | Admitting: *Deleted

## 2017-02-01 ENCOUNTER — Ambulatory Visit (HOSPITAL_COMMUNITY)
Admission: RE | Admit: 2017-02-01 | Discharge: 2017-02-01 | Disposition: A | Discharge status: Home / Self Care | Payer: BLUE CROSS/BLUE SHIELD | Source: Ambulatory Visit | Attending: Oncology | Admitting: Oncology

## 2017-02-01 ENCOUNTER — Ambulatory Visit (HOSPITAL_BASED_OUTPATIENT_CLINIC_OR_DEPARTMENT_OTHER): Payer: BLUE CROSS/BLUE SHIELD

## 2017-02-01 ENCOUNTER — Other Ambulatory Visit (HOSPITAL_BASED_OUTPATIENT_CLINIC_OR_DEPARTMENT_OTHER): Payer: BLUE CROSS/BLUE SHIELD

## 2017-02-01 ENCOUNTER — Telehealth: Payer: Self-pay | Admitting: *Deleted

## 2017-02-01 DIAGNOSIS — C8591 Non-Hodgkin lymphoma, unspecified, lymph nodes of head, face, and neck: Secondary | ICD-10-CM

## 2017-02-01 DIAGNOSIS — C50411 Malignant neoplasm of upper-outer quadrant of right female breast: Secondary | ICD-10-CM

## 2017-02-01 DIAGNOSIS — M8580 Other specified disorders of bone density and structure, unspecified site: Secondary | ICD-10-CM

## 2017-02-01 DIAGNOSIS — Z170 Estrogen receptor positive status [ER+]: Principal | ICD-10-CM

## 2017-02-01 DIAGNOSIS — R221 Localized swelling, mass and lump, neck: Secondary | ICD-10-CM

## 2017-02-01 DIAGNOSIS — Z5111 Encounter for antineoplastic chemotherapy: Secondary | ICD-10-CM

## 2017-02-01 LAB — CBC WITH DIFFERENTIAL/PLATELET
BASO%: 1 % (ref 0.0–2.0)
BASOS ABS: 0 10*3/uL (ref 0.0–0.1)
EOS ABS: 0.1 10*3/uL (ref 0.0–0.5)
EOS%: 3.6 % (ref 0.0–7.0)
HCT: 40.8 % (ref 34.8–46.6)
HEMOGLOBIN: 13.2 g/dL (ref 11.6–15.9)
LYMPH%: 36.7 % (ref 14.0–49.7)
MCH: 29.7 pg (ref 25.1–34.0)
MCHC: 32.4 g/dL (ref 31.5–36.0)
MCV: 91.7 fL (ref 79.5–101.0)
MONO#: 0.3 10*3/uL (ref 0.1–0.9)
MONO%: 6.4 % (ref 0.0–14.0)
NEUT%: 52.3 % (ref 38.4–76.8)
NEUTROS ABS: 2.1 10*3/uL (ref 1.5–6.5)
PLATELETS: 314 10*3/uL (ref 145–400)
RBC: 4.45 10*6/uL (ref 3.70–5.45)
RDW: 13 % (ref 11.2–14.5)
WBC: 3.9 10*3/uL (ref 3.9–10.3)
lymph#: 1.4 10*3/uL (ref 0.9–3.3)

## 2017-02-01 LAB — COMPREHENSIVE METABOLIC PANEL
ALT: 14 U/L (ref 0–55)
AST: 12 U/L (ref 5–34)
Albumin: 3.8 g/dL (ref 3.5–5.0)
Alkaline Phosphatase: 93 U/L (ref 40–150)
Anion Gap: 8 mEq/L (ref 3–11)
BUN: 11.3 mg/dL (ref 7.0–26.0)
CALCIUM: 9.4 mg/dL (ref 8.4–10.4)
CHLORIDE: 106 meq/L (ref 98–109)
CO2: 26 meq/L (ref 22–29)
CREATININE: 0.7 mg/dL (ref 0.6–1.1)
EGFR: >60 mL/min/{1.73_m2} (ref >60)
Glucose: 90 mg/dl (ref 70–140)
POTASSIUM: 4.3 meq/L (ref 3.5–5.1)
Sodium: 139 mEq/L (ref 136–145)
Total Bilirubin: 0.46 mg/dL (ref 0.20–1.20)
Total Protein: 7.2 g/dL (ref 6.4–8.3)

## 2017-02-01 MED ORDER — FULVESTRANT 250 MG/5ML IM SOLN
500.0000 mg | INTRAMUSCULAR | Status: DC
  Administered 2017-02-01: 500 mg via INTRAMUSCULAR
  Filled 2017-02-01: qty 10

## 2017-02-01 NOTE — Patient Instructions (Signed)

## 2017-02-01 NOTE — Telephone Encounter (Signed)
This RN spoke with pt per concern noted by pt of new lump at base of neck.  Noted at base of neck - inner right clavicle fixed thumbnail size smooth nodule.   Pt states noted onset over the past week.  She is having pain ( mild ) across clavicle.  No other areas of abnormalities in neck.  Pt will proceed to an U/S of the neck.

## 2017-02-02 ENCOUNTER — Other Ambulatory Visit: Payer: Self-pay | Admitting: *Deleted

## 2017-02-02 ENCOUNTER — Telehealth: Payer: Self-pay | Admitting: *Deleted

## 2017-02-02 NOTE — Telephone Encounter (Signed)
This RN spoke with pt per her results of [place] - Elizabeth Miles denies any known injury.  She will monitor area and if it enlarges or worsens will call.

## 2017-03-01 ENCOUNTER — Ambulatory Visit (HOSPITAL_BASED_OUTPATIENT_CLINIC_OR_DEPARTMENT_OTHER): Payer: BLUE CROSS/BLUE SHIELD

## 2017-03-01 ENCOUNTER — Other Ambulatory Visit (HOSPITAL_BASED_OUTPATIENT_CLINIC_OR_DEPARTMENT_OTHER): Payer: BLUE CROSS/BLUE SHIELD

## 2017-03-01 VITALS — BP 126/85 | HR 90 | Temp 98.6°F | Resp 18

## 2017-03-01 DIAGNOSIS — C50411 Malignant neoplasm of upper-outer quadrant of right female breast: Secondary | ICD-10-CM | POA: Diagnosis not present

## 2017-03-01 DIAGNOSIS — Z170 Estrogen receptor positive status [ER+]: Secondary | ICD-10-CM

## 2017-03-01 DIAGNOSIS — M8580 Other specified disorders of bone density and structure, unspecified site: Secondary | ICD-10-CM

## 2017-03-01 DIAGNOSIS — Z5111 Encounter for antineoplastic chemotherapy: Secondary | ICD-10-CM | POA: Diagnosis not present

## 2017-03-01 LAB — CBC WITH DIFFERENTIAL/PLATELET
BASO%: 0.6 % (ref 0.0–2.0)
BASOS ABS: 0 10*3/uL (ref 0.0–0.1)
EOS%: 4.1 % (ref 0.0–7.0)
Eosinophils Absolute: 0.2 10*3/uL (ref 0.0–0.5)
HEMATOCRIT: 42.6 % (ref 34.8–46.6)
HGB: 14 g/dL (ref 11.6–15.9)
LYMPH#: 1.3 10*3/uL (ref 0.9–3.3)
LYMPH%: 27 % (ref 14.0–49.7)
MCH: 30.1 pg (ref 25.1–34.0)
MCHC: 32.9 g/dL (ref 31.5–36.0)
MCV: 91.6 fL (ref 79.5–101.0)
MONO#: 0.4 10*3/uL (ref 0.1–0.9)
MONO%: 8.6 % (ref 0.0–14.0)
NEUT%: 59.7 % (ref 38.4–76.8)
NEUTROS ABS: 2.8 10*3/uL (ref 1.5–6.5)
Platelets: 281 10*3/uL (ref 145–400)
RBC: 4.65 10*6/uL (ref 3.70–5.45)
RDW: 13.5 % (ref 11.2–14.5)
WBC: 4.7 10*3/uL (ref 3.9–10.3)

## 2017-03-01 LAB — COMPREHENSIVE METABOLIC PANEL
ALBUMIN: 4.1 g/dL (ref 3.5–5.0)
ALK PHOS: 94 U/L (ref 40–150)
ALT: 17 U/L (ref 0–55)
AST: 13 U/L (ref 5–34)
Anion Gap: 9 mEq/L (ref 3–11)
BILIRUBIN TOTAL: 0.45 mg/dL (ref 0.20–1.20)
BUN: 24 mg/dL (ref 7.0–26.0)
CALCIUM: 9.8 mg/dL (ref 8.4–10.4)
CO2: 27 mEq/L (ref 22–29)
CREATININE: 0.8 mg/dL (ref 0.6–1.1)
Chloride: 103 mEq/L (ref 98–109)
EGFR: >60 mL/min/{1.73_m2} (ref >60)
GLUCOSE: 92 mg/dL (ref 70–140)
Potassium: 4.5 mEq/L (ref 3.5–5.1)
SODIUM: 139 meq/L (ref 136–145)
TOTAL PROTEIN: 7.6 g/dL (ref 6.4–8.3)

## 2017-03-01 MED ORDER — FULVESTRANT 250 MG/5ML IM SOLN
500.0000 mg | INTRAMUSCULAR | Status: DC
  Administered 2017-03-01: 500 mg via INTRAMUSCULAR

## 2017-03-29 ENCOUNTER — Inpatient Hospital Stay: Payer: BLUE CROSS/BLUE SHIELD

## 2017-03-29 ENCOUNTER — Inpatient Hospital Stay: Payer: BLUE CROSS/BLUE SHIELD | Attending: Oncology

## 2017-03-29 DIAGNOSIS — Z79818 Long term (current) use of other agents affecting estrogen receptors and estrogen levels: Secondary | ICD-10-CM | POA: Diagnosis not present

## 2017-03-29 DIAGNOSIS — Z170 Estrogen receptor positive status [ER+]: Secondary | ICD-10-CM | POA: Insufficient documentation

## 2017-03-29 DIAGNOSIS — C50411 Malignant neoplasm of upper-outer quadrant of right female breast: Secondary | ICD-10-CM

## 2017-03-29 DIAGNOSIS — M8580 Other specified disorders of bone density and structure, unspecified site: Secondary | ICD-10-CM

## 2017-03-29 LAB — COMPREHENSIVE METABOLIC PANEL
ALBUMIN: 4 g/dL (ref 3.5–5.0)
ALT: 18 U/L (ref 0–55)
AST: 17 U/L (ref 5–34)
Alkaline Phosphatase: 108 U/L (ref 40–150)
Anion gap: 9 (ref 3–11)
BILIRUBIN TOTAL: 0.4 mg/dL (ref 0.2–1.2)
BUN: 13 mg/dL (ref 7–26)
CO2: 25 mmol/L (ref 22–29)
Calcium: 9.6 mg/dL (ref 8.4–10.4)
Chloride: 105 mmol/L (ref 98–109)
Creatinine, Ser: 0.77 mg/dL (ref 0.60–1.10)
GFR calc Af Amer: >60 mL/min (ref >60)
GFR calc non Af Amer: >60 mL/min (ref >60)
GLUCOSE: 83 mg/dL (ref 70–140)
POTASSIUM: 3.8 mmol/L (ref 3.5–5.1)
Sodium: 139 mmol/L (ref 136–145)
TOTAL PROTEIN: 7.8 g/dL (ref 6.4–8.3)

## 2017-03-29 LAB — CBC WITH DIFFERENTIAL/PLATELET
BASOS PCT: 1 %
Basophils Absolute: 0.1 10*3/uL (ref 0.0–0.1)
Eosinophils Absolute: 0.2 10*3/uL (ref 0.0–0.5)
Eosinophils Relative: 3 %
HEMATOCRIT: 44.9 % (ref 34.8–46.6)
HEMOGLOBIN: 14.6 g/dL (ref 11.6–15.9)
Lymphocytes Relative: 28 %
Lymphs Abs: 1.5 10*3/uL (ref 0.9–3.3)
MCH: 30.1 pg (ref 25.1–34.0)
MCHC: 32.5 g/dL (ref 31.5–36.0)
MCV: 92.6 fL (ref 79.5–101.0)
Monocytes Absolute: 0.5 10*3/uL (ref 0.1–0.9)
Monocytes Relative: 8 %
NEUTROS ABS: 3.3 10*3/uL (ref 1.5–6.5)
NEUTROS PCT: 60 %
Platelets: 299 10*3/uL (ref 145–400)
RBC: 4.85 MIL/uL (ref 3.70–5.45)
RDW: 13.6 % (ref 11.2–14.5)
WBC: 5.5 10*3/uL (ref 3.9–10.3)

## 2017-03-29 MED ORDER — FULVESTRANT 250 MG/5ML IM SOLN
500.0000 mg | INTRAMUSCULAR | Status: DC
  Administered 2017-03-29: 500 mg via INTRAMUSCULAR

## 2017-04-26 ENCOUNTER — Inpatient Hospital Stay: Payer: BLUE CROSS/BLUE SHIELD

## 2017-04-26 ENCOUNTER — Inpatient Hospital Stay: Payer: BLUE CROSS/BLUE SHIELD | Attending: Oncology

## 2017-04-26 DIAGNOSIS — M8580 Other specified disorders of bone density and structure, unspecified site: Secondary | ICD-10-CM

## 2017-04-26 DIAGNOSIS — C50411 Malignant neoplasm of upper-outer quadrant of right female breast: Secondary | ICD-10-CM

## 2017-04-26 DIAGNOSIS — Z170 Estrogen receptor positive status [ER+]: Principal | ICD-10-CM

## 2017-04-26 DIAGNOSIS — Z5111 Encounter for antineoplastic chemotherapy: Secondary | ICD-10-CM | POA: Insufficient documentation

## 2017-04-26 LAB — CBC WITH DIFFERENTIAL/PLATELET
BASOS ABS: 0 10*3/uL (ref 0.0–0.1)
BASOS PCT: 1 %
EOS PCT: 3 %
Eosinophils Absolute: 0.2 10*3/uL (ref 0.0–0.5)
HCT: 42.2 % (ref 34.8–46.6)
Hemoglobin: 13.9 g/dL (ref 11.6–15.9)
LYMPHS PCT: 23 %
Lymphs Abs: 1.3 10*3/uL (ref 0.9–3.3)
MCH: 30.2 pg (ref 25.1–34.0)
MCHC: 32.9 g/dL (ref 31.5–36.0)
MCV: 91.7 fL (ref 79.5–101.0)
Monocytes Absolute: 0.4 10*3/uL (ref 0.1–0.9)
Monocytes Relative: 7 %
Neutro Abs: 3.7 10*3/uL (ref 1.5–6.5)
Neutrophils Relative %: 66 %
PLATELETS: 323 10*3/uL (ref 145–400)
RBC: 4.6 MIL/uL (ref 3.70–5.45)
RDW: 13.5 % (ref 11.2–14.5)
WBC: 5.6 10*3/uL (ref 3.9–10.3)

## 2017-04-26 LAB — COMPREHENSIVE METABOLIC PANEL
ALT: 28 U/L (ref 0–55)
ANION GAP: 11 (ref 3–11)
AST: 21 U/L (ref 5–34)
Albumin: 3.8 g/dL (ref 3.5–5.0)
Alkaline Phosphatase: 98 U/L (ref 40–150)
BILIRUBIN TOTAL: 0.6 mg/dL (ref 0.2–1.2)
BUN: 11 mg/dL (ref 7–26)
CO2: 25 mmol/L (ref 22–29)
Calcium: 10 mg/dL (ref 8.4–10.4)
Chloride: 105 mmol/L (ref 98–109)
Creatinine, Ser: 0.81 mg/dL (ref 0.60–1.10)
GFR calc Af Amer: >60 mL/min (ref >60)
Glucose, Bld: 91 mg/dL (ref 70–140)
POTASSIUM: 3.9 mmol/L (ref 3.5–5.1)
Sodium: 141 mmol/L (ref 136–145)
TOTAL PROTEIN: 7.6 g/dL (ref 6.4–8.3)

## 2017-04-26 MED ORDER — FULVESTRANT 250 MG/5ML IM SOLN
500.0000 mg | INTRAMUSCULAR | Status: DC
  Administered 2017-04-26: 500 mg via INTRAMUSCULAR

## 2017-04-26 NOTE — Patient Instructions (Signed)

## 2017-05-17 NOTE — Progress Notes (Signed)
[place]  [phone] [phone]     ID: Elizabeth Miles DOB: [birth year]  MR#: [id number]  EXN#:[id number]  Patient Care Team: Clinton Quant, MD as PCP - General (Internal Medicine) Pomposini, Cherly Anderson, MD as Referring Physician (Internal Medicine) Rolm Bookbinder, MD as Consulting Physician (General Surgery) Magrinat, Virgie Dad, MD as Consulting Physician (Oncology) Delice Bison, Charlestine Massed, NP as Nurse Practitioner (Hematology and Oncology) GYN: Servando Salina MD  CHIEF COMPLAINT: right breat cancer  CURRENT TREATMENT: Fulvestrant, [denosumab/Prolia]  BREAST CANCER HISTORY: From Dr. Dana Allan earlier notes:  "Trinette Vera is a 52 y.o. female. Without significant past medical history who on June 2013 had a mammogram that was normal. But there was on physical exam possibility of a cyst noted in the right breast. The mammogram was negative. In 2014 June patient noted on exam another lump in the right breast. She underwent a diagnostic mammogram on June 10 that showed a right breast nodule in the outer quadrant. She had an ultrasound performed that showed at the 9:30 o'clock position a 3.6 cm area and then added 10:00 position 1.3 cm area with a total area being anywhere between 5-6 cm. The patient went on to have a right breast biopsy performed in [place]. The pathology revealed an invasive ductal carcinoma. This is been confirmed by our pathology as well. The carcinoma and papillary features and was felt to be between a grade 1 and 2. The tumor was estrogen receptor positive strongly (100%) progesterone receptor negative HER-2/neu negative with a Ki-67 that showed a high proliferation rate. Patient is now seen in medical oncology for discussion of treatment options."  Bilateral breast MRI on 08/27/2012 revealed in the right upper quadrant irregular lobulated mass with a satellite nodule or lobulation within 2 mm at its superior aspect,  measured together as 4.5 x 4.0 x 3.8 cm. There was extension of enhancement to the nipple, suggesting nipple involvement may be present. No lymphadenopathy was noted there was no any other area of abnormal enhancement in either breast (clinical stage IIA, T2 N0).  PET scan performed on 08/29/2012 revealed the primary breast cancer measuring 3.2 x 3.3 cm with SUV of 16. It was adjacent nodule along the superiomedial border of the primary mass measuring 1.4 cm which was also hypermetabolic. There were no additional areas of abnormal hypermetabolism. No abnormal hypermetabolic activity was seen in the chest, abdomen/pelvis,within the liver, pancreas, adrenal glands or spleen. No hypermetabolic lymph nodes. In the skeleton, no focal hypermetabolic activity to suggest skeletal metastasis was seen.  Completed neoadjuvant chemotherapy consisting of Q14 day Adriamycin/Cytoxan x 4 cycles on 10/25/2012, followed by one dose of neoadjuvant Taxol on 11/08/2012. She developed significant grade 2 neuropathy and Taxol was discontinued. Then received neoadjuvant chemotherapy consisting of single agent Abraxane given on day 1, 8, 15 of each 28 day cycle. She completed therapy on 11/15/12 - 02/14/13.   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 Repeat HER-2 testing was now positive. The patient was started on tamoxifen March 2015 and on Herceptin the same month.  Her subsequent history is as detailed below.  INTERVAL HISTORY: Ahmari returns today for follow-up and treatment of her stage II estrogen receptor positive breast cancer, accompanied by her mother. She receives fulvestrant with a dose due today. She tolerates this well. She continues to have intense hot flashes.  She rarely had to wear a coat through the winter.,  She felt so warm  Since her last visit she  completed an ultrasound of the soft tissues head and neck on 02/01/2017 showing: Palpable abnormality at the right base of the neck corresponds to mild inflammatory changes within the right sternoclavicular joint. This is nonspecific in etiology, but can be due to degenerative, inflammatory, or infectious arthropathy. Findings could also be traumatic in etiology. A neoplastic etiology is thought to be unlikely.  She also had her appendix removed emergently at [facility] late last year.  She did well with the surgery   REVIEW OF SYSTEMS:  Shinita reports that her children are sick. Her son has abdominal issues, but the doctors are unsure of what the exactly cause is. She notes that he is home bound from school because he frequently gets physically sick. She reports that her daughter, who is 67, also has medical issues. Aayliah has been helping keep her granddaughter. She notes that her right knee hurts at night. She also has pain in her neck. She sleeps on a certain side. She denies unusual headaches, visual changes, nausea, vomiting, or dizziness. There has been no unusual cough, phlegm production, or pleurisy. This been no change in bowel or bladder habits. She denies unexplained fatigue or unexplained weight loss, bleeding, rash, or fever. A detailed review of systems was otherwise stable.    PAST MEDICAL HISTORY: Past Medical History:  Diagnosis Date  . Allergy   . Anxiety   . Breast cancer ([place]) 08/06/12   invasive ductal carcioma  . Complication of anesthesia    1986 ; problem waking up  . GERD (gastroesophageal reflux disease)   . GERD (gastroesophageal reflux disease) 08/22/2012  . Wears glasses     PAST SURGICAL HISTORY: Past Surgical History:  Procedure Laterality Date  . BREAST BIOPSY Right 08/06/12  . BREAST LUMPECTOMY WITH NEEDLE LOCALIZATION AND AXILLARY SENTINEL LYMPH NODE BX Right 03/10/2013   Procedure: BREAST LUMPECTOMY WITH NEEDLE LOCALIZATION AND AXILLARY SENTINEL LYMPH  NODE BIOPSY;  Surgeon: Rolm Bookbinder, MD;  Location: [facility];  Service: General;  Laterality: Right;  . [place]  . DILATION AND CURETTAGE OF UTERUS  1993  . POPLITEAL SYNOVIAL CYST EXCISION  1970  . PORT-A-CATH REMOVAL Left 03/10/2013   Procedure: REMOVAL PORT-A-CATH;  Surgeon: Rolm Bookbinder, MD;  Location: [place];  Service: General;  Laterality: Left;  . PORTACATH PLACEMENT Left 09/12/2012   Procedure: INSERTION PORT-A-CATH;  Surgeon: Rolm Bookbinder, MD;  Location: [facility] OR;  Service: General;  Laterality: Left;    FAMILY HISTORY Family History  Problem Relation Age of Onset  . Hypertension Mother   . Bladder Cancer Maternal Uncle 68  . Cancer Paternal Grandmother 12       lung cancer  . Lung cancer Paternal Grandfather        dx in his 76s  . COPD Paternal Uncle    the patient's parents are living and in good health. The patient has one brother, no sisters. There is no history of breast or ovarian cancer in the family to her knowledge  GYNECOLOGIC HISTORY:  Patient's last menstrual period was 03/12/2012. Menarche age 44, first live birth age 58. The patient is GX P3. 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 Her husband, Jori Moll, is a Tour manager. A  daughter, Lovena Le,  lives in [place] and daughter Caryl Pina has a baby girl, the patient's first grandchild, 64 years old as of December 2017; she stays with Coralyn Mark frequently.. Son Jori Moll "Brianne" is 34 as of December 2017 and at home. The patient is a Psychologist, forensic.    ADVANCED DIRECTIVES: Not in place   HEALTH MAINTENANCE: Social History   Tobacco Use  . Smoking status: Never Smoker  . Smokeless tobacco: Never Used  Substance Use Topics  . Alcohol use: No  . Drug use: No     Colonoscopy:  PAP:  Bone density:  Lipid  panel:  Allergies  Allergen Reactions  . Anastrozole Anaphylaxis  . Darvocet [Propoxyphene N-Acetaminophen] Shortness Of Breath and Swelling  . Shellfish Allergy Shortness Of Breath and Swelling  . Other     CT dye, "chest hurt" "makes me feel cold"    Current Outpatient Medications  Medication Sig Dispense Refill  . acetaminophen (TYLENOL) 500 MG tablet Take 1,000 mg by mouth every 6 (six) hours as needed. Reported on 06/14/2015    . aspirin 325 MG tablet Take 81 mg by mouth daily.    . cetirizine (ZYRTEC) 10 MG tablet Take 10 mg by mouth daily.    . Cholecalciferol ([employer] D3) 1000 units capsule Take 1 capsule (1,000 Units total) by mouth daily. 90 capsule 3  . Dexlansoprazole (DEXILANT) 30 MG capsule Take 1 capsule (30 mg total) by mouth daily. 90 capsule 4  . ibuprofen (ADVIL,MOTRIN) 800 MG tablet Take 800 mg by mouth every 8 (eight) hours as needed for moderate pain (for back pain). Reported on 06/14/2015    . polyethylene glycol (MIRALAX / GLYCOLAX) packet Take 17 g by mouth daily.    Marland Kitchen venlafaxine XR (EFFEXOR-XR) 150 MG 24 hr capsule Take 1 capsule (150 mg total) by mouth daily with breakfast. 90 capsule 3   No current facility-administered medications for this visit.     OBJECTIVE: Middle-aged white woman in no acute distress  Vitals:   05/22/17 1338  BP: 129/82  Pulse: 85  Resp: 18  Temp: 98.5 F (36.9 C)  SpO2: 100%     Body mass index is 33.81 kg/m.    ECOG FS:1 - Symptomatic but completely ambulatory Filed Weights   05/22/17 1338  Weight: 215 lb 14.4 oz (97.9 kg)    Sclerae unicteric, pupils round and equal Oropharynx clear and moist No cervical or supraclavicular adenopathy Lungs no rales or rhonchi Heart regular rate and rhythm Abd soft, nontender, positive bowel sounds MSK no focal spinal tenderness, no upper extremity lymphedema Neuro: nonfocal, well oriented, mildly depressed affect Breasts: The right breast has undergone lumpectomy followed by radiation.   There is no evidence of local recurrence or residual disease.  The left breast is unremarkable.  Both axillae are benign.  LAB RESULTS:  CMP     Component Value Date/Time   NA 141 05/22/2017 1305   NA 139 03/01/2017 1021   K 3.9 05/22/2017 1305   K 4.5 03/01/2017 1021   CL 105 05/22/2017 1305   CO2 28 05/22/2017 1305   CO2 27 03/01/2017 1021   GLUCOSE 97 05/22/2017 1305   GLUCOSE 92 03/01/2017 1021   BUN 7 05/22/2017 1305   BUN 24.0 03/01/2017 1021   CREATININE 0.74 05/22/2017 1305   CREATININE 0.8 03/01/2017 1021   CALCIUM 9.5 05/22/2017 1305   CALCIUM 9.8 03/01/2017 1021   PROT 7.3 05/22/2017 1305   PROT 7.6 03/01/2017 1021   ALBUMIN 3.8 05/22/2017 1305  ALBUMIN 4.1 03/01/2017 1021   AST 26 05/22/2017 1305   AST 13 03/01/2017 1021   ALT 32 05/22/2017 1305   ALT 17 03/01/2017 1021   ALKPHOS 97 05/22/2017 1305   ALKPHOS 94 03/01/2017 1021   BILITOT 0.3 05/22/2017 1305   BILITOT 0.45 03/01/2017 1021   GFRNONAA >60 05/22/2017 1305   GFRAA >60 05/22/2017 1305    I No results found for: SPEP  Lab Results  Component Value Date   WBC 4.7 05/22/2017   NEUTROABS 2.7 05/22/2017   HGB 13.2 05/22/2017   HCT 39.6 05/22/2017   MCV 89.1 05/22/2017   PLT 319 05/22/2017      Chemistry      Component Value Date/Time   NA 141 05/22/2017 1305   NA 139 03/01/2017 1021   K 3.9 05/22/2017 1305   K 4.5 03/01/2017 1021   CL 105 05/22/2017 1305   CO2 28 05/22/2017 1305   CO2 27 03/01/2017 1021   BUN 7 05/22/2017 1305   BUN 24.0 03/01/2017 1021   CREATININE 0.74 05/22/2017 1305   CREATININE 0.8 03/01/2017 1021      Component Value Date/Time   CALCIUM 9.5 05/22/2017 1305   CALCIUM 9.8 03/01/2017 1021   ALKPHOS 97 05/22/2017 1305   ALKPHOS 94 03/01/2017 1021   AST 26 05/22/2017 1305   AST 13 03/01/2017 1021   ALT 32 05/22/2017 1305   ALT 17 03/01/2017 1021   BILITOT 0.3 05/22/2017 1305   BILITOT 0.45 03/01/2017 1021       No results found for: LABCA2  No  components found for: LABCA125  No results for input(s): INR in the last 168 hours.  Urinalysis    Component Value Date/Time   LABSPEC 1.020 09/28/2015 1224   PHURINE 5.0 09/28/2015 1224   GLUCOSEU Negative 09/28/2015 1224   HGBUR Negative 09/28/2015 1224   BILIRUBINUR Negative 09/28/2015 1224   KETONESUR Negative 09/28/2015 1224   PROTEINUR Negative 09/28/2015 1224   UROBILINOGEN 0.2 09/28/2015 1224   NITRITE Negative 09/28/2015 1224   LEUKOCYTESUR Negative 09/28/2015 1224    STUDIES:  Since her last visit she completed an ultrasound of the soft tissues head and neck on 02/01/2017 showing: Palpable abnormality at the right base of the neck corresponds to mild inflammatory changes within the right sternoclavicular joint. This is nonspecific in etiology, but can be due to degenerative, inflammatory, or infectious arthropathy. Findings could also be traumatic in etiology. A neoplastic etiology is thought to be unlike  Bilateral diagnostic mamm[facility] at [facility], with tomography, on 04/05/2017 showed the breast density to be category B.  There was no evidence of malignancy.  ASSESSMENT: 52 y.o. [place] woman  (1) status post right breast upper outer quadrant biopsy 08/06/2012 for a clinical mT2 N0, stage IIA invasive ductal carcinoma, estrogen receptor 3+ positive, progesterone receptor and HER-2 negative, Ki67 3+ [[id number]]  (2) treated neoadjuvantly with dose dense cyclophosphamide and doxorubicin x4, completed 10/25/2012, followed by a single dose of weekly paclitaxel, poorly tolerated; followed by 11 doses of Abraxane completed 02/14/2013  (3) status post right lumpectomy and sentinel lymph node sampling 03/10/2013 for a ypT2 pN0, stage IIA invasive ductal carcinoma, grade 2, estrogen receptor 100% positive, progesterone receptor 21% positive, with an MIB-1 of 14%, and HER-2 amplified, the signals ratio being 2.52, the number per cell 2.90  (a) reclassified as stage IB in  the 2018 prognostic classification  (4) adjuvant radiation completed March 2015 (?)  (5) started tamoxifen March 2015, stopped 05/25/14  because of superficial clot to left cephalic vein; began anastrozole on 05/29/14, discontinued after 2 weeks with rash; started letrozole 05/24/2015, discontinued after 5 days use because of angioedema-like symptoms  (6) started trastuzumab 05/23/2013, completed 05/15/14  (7) osteopenia: With T score of -1.7 on bone density scan at [facility] 06/10/2014.   (a) with dental clearance started denosumab/Prolia 12/09/2015  (b) denosumab/Proliadiscontinued after October 2017 dose at patient's request  (8) fulvestrant started 06/23/2015  PLAN: Kensi is now a little over 4 years out from definitive surgery for her breast cancer, with no evidence of disease recurrence.  This is very favorable.  She is tolerating the fulvestrant well.  The plan is to continue that on additional 2 years.  She will see [place] again in 6 months and then again in 12 months, after her next February mammogram at [facility].  She does have significant family issues and I think she can use the extra support here.  I have encouraged her to exercise more.  In terms of dieting I have encouraged her to avoid carbohydrates.  She knows to call for any other issues that may develop before her next visit here.    Magrinat, Virgie Dad, MD  05/22/17 2:06 PM Medical Oncology and Hematology [facility] [address] [place], [place] [address] Tel. [phone]    Fax. [phone]  This document serves as a record of services personally performed by Lurline Del, MD. It was created on his behalf by Sheron Nightingale, a trained medical scribe. The creation of this record is based on the scribe's personal observations and the provider's statements to them.   I have reviewed the above documentation for accuracy and completeness, and I agree with the above.

## 2017-05-21 DIAGNOSIS — J302 Other seasonal allergic rhinitis: Secondary | ICD-10-CM | POA: Insufficient documentation

## 2017-05-21 DIAGNOSIS — F330 Major depressive disorder, recurrent, mild: Secondary | ICD-10-CM | POA: Insufficient documentation

## 2017-05-21 DIAGNOSIS — Z9889 Other specified postprocedural states: Secondary | ICD-10-CM | POA: Insufficient documentation

## 2017-05-21 DIAGNOSIS — M810 Age-related osteoporosis without current pathological fracture: Secondary | ICD-10-CM | POA: Insufficient documentation

## 2017-05-21 DIAGNOSIS — Z0000 Encounter for general adult medical examination without abnormal findings: Secondary | ICD-10-CM | POA: Insufficient documentation

## 2017-05-22 ENCOUNTER — Inpatient Hospital Stay: Payer: BLUE CROSS/BLUE SHIELD | Attending: Oncology

## 2017-05-22 ENCOUNTER — Inpatient Hospital Stay (HOSPITAL_BASED_OUTPATIENT_CLINIC_OR_DEPARTMENT_OTHER): Payer: BLUE CROSS/BLUE SHIELD | Admitting: Oncology

## 2017-05-22 ENCOUNTER — Inpatient Hospital Stay: Payer: BLUE CROSS/BLUE SHIELD

## 2017-05-22 VITALS — BP 129/82 | HR 85 | Temp 98.5°F | Resp 18 | Ht 67.0 in | Wt 215.9 lb

## 2017-05-22 DIAGNOSIS — F419 Anxiety disorder, unspecified: Secondary | ICD-10-CM

## 2017-05-22 DIAGNOSIS — M8580 Other specified disorders of bone density and structure, unspecified site: Secondary | ICD-10-CM

## 2017-05-22 DIAGNOSIS — Z8052 Family history of malignant neoplasm of bladder: Secondary | ICD-10-CM | POA: Insufficient documentation

## 2017-05-22 DIAGNOSIS — K219 Gastro-esophageal reflux disease without esophagitis: Secondary | ICD-10-CM | POA: Diagnosis not present

## 2017-05-22 DIAGNOSIS — Z79818 Long term (current) use of other agents affecting estrogen receptors and estrogen levels: Secondary | ICD-10-CM | POA: Insufficient documentation

## 2017-05-22 DIAGNOSIS — Z801 Family history of malignant neoplasm of trachea, bronchus and lung: Secondary | ICD-10-CM | POA: Insufficient documentation

## 2017-05-22 DIAGNOSIS — Z170 Estrogen receptor positive status [ER+]: Secondary | ICD-10-CM

## 2017-05-22 DIAGNOSIS — Z9221 Personal history of antineoplastic chemotherapy: Secondary | ICD-10-CM | POA: Insufficient documentation

## 2017-05-22 DIAGNOSIS — C50411 Malignant neoplasm of upper-outer quadrant of right female breast: Secondary | ICD-10-CM

## 2017-05-22 DIAGNOSIS — Z923 Personal history of irradiation: Secondary | ICD-10-CM | POA: Diagnosis not present

## 2017-05-22 DIAGNOSIS — R232 Flushing: Secondary | ICD-10-CM

## 2017-05-22 DIAGNOSIS — Z79899 Other long term (current) drug therapy: Secondary | ICD-10-CM | POA: Diagnosis not present

## 2017-05-22 DIAGNOSIS — Z7982 Long term (current) use of aspirin: Secondary | ICD-10-CM | POA: Diagnosis not present

## 2017-05-22 LAB — CBC WITH DIFFERENTIAL/PLATELET
Basophils Absolute: 0 10*3/uL (ref 0.0–0.1)
Basophils Relative: 1 %
EOS ABS: 0.2 10*3/uL (ref 0.0–0.5)
EOS PCT: 4 %
HCT: 39.6 % (ref 34.8–46.6)
Hemoglobin: 13.2 g/dL (ref 11.6–15.9)
LYMPHS ABS: 1.4 10*3/uL (ref 0.9–3.3)
LYMPHS PCT: 30 %
MCH: 29.6 pg (ref 25.1–34.0)
MCHC: 33.3 g/dL (ref 31.5–36.0)
MCV: 89.1 fL (ref 79.5–101.0)
MONOS PCT: 7 %
Monocytes Absolute: 0.3 10*3/uL (ref 0.1–0.9)
Neutro Abs: 2.7 10*3/uL (ref 1.5–6.5)
Neutrophils Relative %: 58 %
PLATELETS: 319 10*3/uL (ref 145–400)
RBC: 4.45 MIL/uL (ref 3.70–5.45)
RDW: 13.2 % (ref 11.2–14.5)
WBC: 4.7 10*3/uL (ref 3.9–10.3)

## 2017-05-22 LAB — COMPREHENSIVE METABOLIC PANEL
ALBUMIN: 3.8 g/dL (ref 3.5–5.0)
ALT: 32 U/L (ref 0–55)
AST: 26 U/L (ref 5–34)
Alkaline Phosphatase: 97 U/L (ref 40–150)
Anion gap: 8 (ref 3–11)
BUN: 7 mg/dL (ref 7–26)
CHLORIDE: 105 mmol/L (ref 98–109)
CO2: 28 mmol/L (ref 22–29)
CREATININE: 0.74 mg/dL (ref 0.60–1.10)
Calcium: 9.5 mg/dL (ref 8.4–10.4)
GFR calc Af Amer: >60 mL/min (ref >60)
GLUCOSE: 97 mg/dL (ref 70–140)
POTASSIUM: 3.9 mmol/L (ref 3.5–5.1)
SODIUM: 141 mmol/L (ref 136–145)
Total Bilirubin: 0.3 mg/dL (ref 0.2–1.2)
Total Protein: 7.3 g/dL (ref 6.4–8.3)

## 2017-05-22 MED ORDER — GABAPENTIN 100 MG PO CAPS: 300.0000 mg | ORAL_CAPSULE | Freq: Three times a day (TID) | ORAL | 3 refills | Status: DC | PRN

## 2017-05-22 MED ORDER — FULVESTRANT 250 MG/5ML IM SOLN
500.0000 mg | INTRAMUSCULAR | Status: DC
  Administered 2017-05-22: 500 mg via INTRAMUSCULAR

## 2017-05-22 MED ORDER — FULVESTRANT 250 MG/5ML IM SOLN
INTRAMUSCULAR | Status: AC
  Filled 2017-05-22: qty 5

## 2017-05-22 NOTE — Patient Instructions (Signed)

## 2017-05-24 ENCOUNTER — Ambulatory Visit: Payer: BLUE CROSS/BLUE SHIELD

## 2017-05-24 ENCOUNTER — Ambulatory Visit: Payer: BLUE CROSS/BLUE SHIELD | Admitting: Oncology

## 2017-05-24 ENCOUNTER — Other Ambulatory Visit: Payer: BLUE CROSS/BLUE SHIELD

## 2017-06-15 ENCOUNTER — Other Ambulatory Visit: Payer: Self-pay

## 2017-06-18 ENCOUNTER — Other Ambulatory Visit: Payer: Self-pay | Admitting: *Deleted

## 2017-06-18 MED ORDER — GABAPENTIN 100 MG PO CAPS: 300.0000 mg | ORAL_CAPSULE | Freq: Three times a day (TID) | ORAL | 0 refills | Status: DC | PRN

## 2017-06-19 ENCOUNTER — Inpatient Hospital Stay: Payer: BLUE CROSS/BLUE SHIELD | Attending: Oncology

## 2017-06-19 ENCOUNTER — Inpatient Hospital Stay: Payer: BLUE CROSS/BLUE SHIELD

## 2017-06-19 VITALS — BP 138/91 | HR 88 | Temp 98.2°F | Resp 20

## 2017-06-19 DIAGNOSIS — C50411 Malignant neoplasm of upper-outer quadrant of right female breast: Secondary | ICD-10-CM

## 2017-06-19 DIAGNOSIS — Z79818 Long term (current) use of other agents affecting estrogen receptors and estrogen levels: Secondary | ICD-10-CM | POA: Insufficient documentation

## 2017-06-19 DIAGNOSIS — Z9221 Personal history of antineoplastic chemotherapy: Secondary | ICD-10-CM | POA: Diagnosis not present

## 2017-06-19 DIAGNOSIS — Z923 Personal history of irradiation: Secondary | ICD-10-CM | POA: Diagnosis not present

## 2017-06-19 DIAGNOSIS — Z170 Estrogen receptor positive status [ER+]: Secondary | ICD-10-CM | POA: Diagnosis not present

## 2017-06-19 DIAGNOSIS — M8580 Other specified disorders of bone density and structure, unspecified site: Secondary | ICD-10-CM

## 2017-06-19 LAB — COMPREHENSIVE METABOLIC PANEL
ALT: 27 U/L (ref 0–55)
AST: 17 U/L (ref 5–34)
Albumin: 3.9 g/dL (ref 3.5–5.0)
Alkaline Phosphatase: 99 U/L (ref 40–150)
Anion gap: 10 (ref 3–11)
BUN: 11 mg/dL (ref 7–26)
CHLORIDE: 106 mmol/L (ref 98–109)
CO2: 25 mmol/L (ref 22–29)
CREATININE: 0.75 mg/dL (ref 0.60–1.10)
Calcium: 9.7 mg/dL (ref 8.4–10.4)
GFR calc Af Amer: >60 mL/min (ref >60)
GFR calc non Af Amer: >60 mL/min (ref >60)
Glucose, Bld: 87 mg/dL (ref 70–140)
Potassium: 4.3 mmol/L (ref 3.5–5.1)
SODIUM: 141 mmol/L (ref 136–145)
Total Bilirubin: 0.3 mg/dL (ref 0.2–1.2)
Total Protein: 7.5 g/dL (ref 6.4–8.3)

## 2017-06-19 LAB — CBC WITH DIFFERENTIAL/PLATELET
BASOS ABS: 0.1 10*3/uL (ref 0.0–0.1)
Basophils Relative: 1 %
EOS ABS: 0.2 10*3/uL (ref 0.0–0.5)
EOS PCT: 3 %
HCT: 41.3 % (ref 34.8–46.6)
HEMOGLOBIN: 13.4 g/dL (ref 11.6–15.9)
LYMPHS PCT: 31 %
Lymphs Abs: 1.7 10*3/uL (ref 0.9–3.3)
MCH: 29.8 pg (ref 25.1–34.0)
MCHC: 32.4 g/dL (ref 31.5–36.0)
MCV: 91.8 fL (ref 79.5–101.0)
Monocytes Absolute: 0.4 10*3/uL (ref 0.1–0.9)
Monocytes Relative: 7 %
Neutro Abs: 3.4 10*3/uL (ref 1.5–6.5)
Neutrophils Relative %: 58 %
PLATELETS: 315 10*3/uL (ref 145–400)
RBC: 4.5 MIL/uL (ref 3.70–5.45)
RDW: 13.1 % (ref 11.2–14.5)
WBC: 5.7 10*3/uL (ref 3.9–10.3)

## 2017-06-19 MED ORDER — FULVESTRANT 250 MG/5ML IM SOLN
INTRAMUSCULAR | Status: AC
Start: 2017-06-19 — End: ?
  Filled 2017-06-19: qty 5

## 2017-06-19 MED ORDER — FULVESTRANT 250 MG/5ML IM SOLN
500.0000 mg | INTRAMUSCULAR | Status: DC
  Administered 2017-06-19: 500 mg via INTRAMUSCULAR

## 2017-06-19 NOTE — Patient Instructions (Signed)

## 2017-07-17 ENCOUNTER — Inpatient Hospital Stay: Payer: BLUE CROSS/BLUE SHIELD | Attending: Oncology

## 2017-07-17 ENCOUNTER — Inpatient Hospital Stay: Payer: BLUE CROSS/BLUE SHIELD

## 2017-07-17 VITALS — BP 135/90 | HR 87 | Temp 97.8°F | Resp 18

## 2017-07-17 DIAGNOSIS — C50411 Malignant neoplasm of upper-outer quadrant of right female breast: Secondary | ICD-10-CM | POA: Diagnosis not present

## 2017-07-17 DIAGNOSIS — Z170 Estrogen receptor positive status [ER+]: Secondary | ICD-10-CM | POA: Diagnosis not present

## 2017-07-17 DIAGNOSIS — Z79818 Long term (current) use of other agents affecting estrogen receptors and estrogen levels: Secondary | ICD-10-CM | POA: Diagnosis not present

## 2017-07-17 DIAGNOSIS — Z923 Personal history of irradiation: Secondary | ICD-10-CM | POA: Diagnosis not present

## 2017-07-17 DIAGNOSIS — Z9221 Personal history of antineoplastic chemotherapy: Secondary | ICD-10-CM | POA: Insufficient documentation

## 2017-07-17 DIAGNOSIS — M8580 Other specified disorders of bone density and structure, unspecified site: Secondary | ICD-10-CM

## 2017-07-17 LAB — COMPREHENSIVE METABOLIC PANEL
ALT: 21 U/L (ref 0–55)
ANION GAP: 7 (ref 3–11)
AST: 18 U/L (ref 5–34)
Albumin: 3.8 g/dL (ref 3.5–5.0)
Alkaline Phosphatase: 87 U/L (ref 40–150)
BILIRUBIN TOTAL: 0.3 mg/dL (ref 0.2–1.2)
BUN: 11 mg/dL (ref 7–26)
CO2: 27 mmol/L (ref 22–29)
Calcium: 9.3 mg/dL (ref 8.4–10.4)
Chloride: 107 mmol/L (ref 98–109)
Creatinine, Ser: 0.73 mg/dL (ref 0.60–1.10)
GFR calc non Af Amer: >60 mL/min (ref >60)
Glucose, Bld: 76 mg/dL (ref 70–140)
Potassium: 4.3 mmol/L (ref 3.5–5.1)
Sodium: 141 mmol/L (ref 136–145)
TOTAL PROTEIN: 7.3 g/dL (ref 6.4–8.3)

## 2017-07-17 LAB — CBC WITH DIFFERENTIAL/PLATELET
BASOS PCT: 1 %
Basophils Absolute: 0.1 10*3/uL (ref 0.0–0.1)
Eosinophils Absolute: 0.3 10*3/uL (ref 0.0–0.5)
Eosinophils Relative: 5 %
HEMATOCRIT: 39.8 % (ref 34.8–46.6)
HEMOGLOBIN: 13.2 g/dL (ref 11.6–15.9)
LYMPHS PCT: 27 %
Lymphs Abs: 1.4 10*3/uL (ref 0.9–3.3)
MCH: 29.4 pg (ref 25.1–34.0)
MCHC: 33.2 g/dL (ref 31.5–36.0)
MCV: 88.5 fL (ref 79.5–101.0)
Monocytes Absolute: 0.4 10*3/uL (ref 0.1–0.9)
Monocytes Relative: 8 %
NEUTROS ABS: 3.1 10*3/uL (ref 1.5–6.5)
Neutrophils Relative %: 59 %
Platelets: 287 10*3/uL (ref 145–400)
RBC: 4.5 MIL/uL (ref 3.70–5.45)
RDW: 13.1 % (ref 11.2–14.5)
WBC: 5.2 10*3/uL (ref 3.9–10.3)

## 2017-07-17 MED ORDER — FULVESTRANT 250 MG/5ML IM SOLN
500.0000 mg | INTRAMUSCULAR | Status: DC
  Administered 2017-07-17: 500 mg via INTRAMUSCULAR

## 2017-07-17 MED ORDER — FULVESTRANT 250 MG/5ML IM SOLN
INTRAMUSCULAR | Status: AC
Start: 2017-07-17 — End: ?
  Filled 2017-07-17: qty 5

## 2017-07-17 NOTE — Patient Instructions (Signed)

## 2017-08-14 ENCOUNTER — Inpatient Hospital Stay: Payer: BLUE CROSS/BLUE SHIELD

## 2017-08-14 ENCOUNTER — Inpatient Hospital Stay: Payer: BLUE CROSS/BLUE SHIELD | Attending: Oncology

## 2017-08-14 VITALS — BP 126/75 | HR 88 | Temp 98.2°F | Resp 18

## 2017-08-14 DIAGNOSIS — Z79818 Long term (current) use of other agents affecting estrogen receptors and estrogen levels: Secondary | ICD-10-CM | POA: Insufficient documentation

## 2017-08-14 DIAGNOSIS — Z923 Personal history of irradiation: Secondary | ICD-10-CM | POA: Insufficient documentation

## 2017-08-14 DIAGNOSIS — C50411 Malignant neoplasm of upper-outer quadrant of right female breast: Secondary | ICD-10-CM | POA: Diagnosis not present

## 2017-08-14 DIAGNOSIS — Z170 Estrogen receptor positive status [ER+]: Principal | ICD-10-CM

## 2017-08-14 DIAGNOSIS — Z9221 Personal history of antineoplastic chemotherapy: Secondary | ICD-10-CM | POA: Insufficient documentation

## 2017-08-14 DIAGNOSIS — M8580 Other specified disorders of bone density and structure, unspecified site: Secondary | ICD-10-CM

## 2017-08-14 LAB — CBC WITH DIFFERENTIAL/PLATELET
BASOS ABS: 0.1 10*3/uL (ref 0.0–0.1)
BASOS PCT: 1 %
Eosinophils Absolute: 0.3 10*3/uL (ref 0.0–0.5)
Eosinophils Relative: 6 %
HCT: 40.8 % (ref 34.8–46.6)
HEMOGLOBIN: 13.2 g/dL (ref 11.6–15.9)
Lymphocytes Relative: 30 %
Lymphs Abs: 1.5 10*3/uL (ref 0.9–3.3)
MCH: 29.5 pg (ref 25.1–34.0)
MCHC: 32.4 g/dL (ref 31.5–36.0)
MCV: 91.1 fL (ref 79.5–101.0)
Monocytes Absolute: 0.5 10*3/uL (ref 0.1–0.9)
Monocytes Relative: 9 %
NEUTROS ABS: 2.8 10*3/uL (ref 1.5–6.5)
NEUTROS PCT: 54 %
Platelets: 317 10*3/uL (ref 145–400)
RBC: 4.48 MIL/uL (ref 3.70–5.45)
RDW: 13.3 % (ref 11.2–14.5)
WBC: 5.1 10*3/uL (ref 3.9–10.3)

## 2017-08-14 LAB — COMPREHENSIVE METABOLIC PANEL
ALBUMIN: 3.9 g/dL (ref 3.5–5.0)
ALK PHOS: 117 U/L (ref 40–150)
ALT: 18 U/L (ref 0–55)
AST: 16 U/L (ref 5–34)
Anion gap: 8 (ref 3–11)
BUN: 14 mg/dL (ref 7–26)
CALCIUM: 9.8 mg/dL (ref 8.4–10.4)
CO2: 27 mmol/L (ref 22–29)
Chloride: 105 mmol/L (ref 98–109)
Creatinine, Ser: 0.76 mg/dL (ref 0.60–1.10)
GFR calc Af Amer: >60 mL/min (ref >60)
GFR calc non Af Amer: >60 mL/min (ref >60)
GLUCOSE: 96 mg/dL (ref 70–140)
POTASSIUM: 4.9 mmol/L (ref 3.5–5.1)
Sodium: 140 mmol/L (ref 136–145)
Total Bilirubin: 0.3 mg/dL (ref 0.2–1.2)
Total Protein: 7.6 g/dL (ref 6.4–8.3)

## 2017-08-14 MED ORDER — FULVESTRANT 250 MG/5ML IM SOLN
INTRAMUSCULAR | Status: AC
  Filled 2017-08-14: qty 10

## 2017-08-14 MED ORDER — FULVESTRANT 250 MG/5ML IM SOLN
500.0000 mg | INTRAMUSCULAR | Status: DC
  Administered 2017-08-14: 500 mg via INTRAMUSCULAR

## 2017-09-04 ENCOUNTER — Telehealth: Payer: Self-pay | Admitting: *Deleted

## 2017-09-04 MED ORDER — CHOLECALCIFEROL 25 MCG (1000 UT) PO CAPS: 1000.0000 [IU] | ORAL_CAPSULE | Freq: Every day | ORAL | 3 refills | Status: DC

## 2017-09-04 MED ORDER — GABAPENTIN 300 MG PO CAPS: 300.0000 mg | ORAL_CAPSULE | Freq: Two times a day (BID) | ORAL | 2 refills | Status: DC

## 2017-09-04 NOTE — Telephone Encounter (Signed)
VM left from pt requesting a return call - no additional information given.  Per return call - Elizabeth Miles states she is using the gabapentin differently and needs a refill for appropriate fill.  [name] states she is using 300 mg twice a day for general arthralgias with good benefit.  This RN refilled above with noted change in tablet dose to 300 mg tablet vs the 100 mg tablet.  Detailed message left on pt's identified VM as well as noted for pharmacist to review new dosage with pt on prescription.

## 2017-09-07 ENCOUNTER — Other Ambulatory Visit: Payer: Self-pay | Admitting: Oncology

## 2017-09-11 ENCOUNTER — Inpatient Hospital Stay: Payer: BLUE CROSS/BLUE SHIELD

## 2017-09-11 ENCOUNTER — Inpatient Hospital Stay: Payer: BLUE CROSS/BLUE SHIELD | Attending: Oncology

## 2017-09-11 VITALS — BP 134/88 | HR 90 | Temp 98.5°F | Resp 20

## 2017-09-11 DIAGNOSIS — Z79818 Long term (current) use of other agents affecting estrogen receptors and estrogen levels: Secondary | ICD-10-CM | POA: Insufficient documentation

## 2017-09-11 DIAGNOSIS — Z923 Personal history of irradiation: Secondary | ICD-10-CM | POA: Insufficient documentation

## 2017-09-11 DIAGNOSIS — M8580 Other specified disorders of bone density and structure, unspecified site: Secondary | ICD-10-CM

## 2017-09-11 DIAGNOSIS — Z9221 Personal history of antineoplastic chemotherapy: Secondary | ICD-10-CM | POA: Insufficient documentation

## 2017-09-11 DIAGNOSIS — Z170 Estrogen receptor positive status [ER+]: Principal | ICD-10-CM

## 2017-09-11 DIAGNOSIS — C50411 Malignant neoplasm of upper-outer quadrant of right female breast: Secondary | ICD-10-CM

## 2017-09-11 LAB — CBC WITH DIFFERENTIAL/PLATELET
Basophils Absolute: 0.1 10*3/uL (ref 0.0–0.1)
Basophils Relative: 1 %
EOS PCT: 5 %
Eosinophils Absolute: 0.2 10*3/uL (ref 0.0–0.5)
HCT: 41.4 % (ref 34.8–46.6)
Hemoglobin: 13.7 g/dL (ref 11.6–15.9)
LYMPHS PCT: 27 %
Lymphs Abs: 1.3 10*3/uL (ref 0.9–3.3)
MCH: 29.3 pg (ref 25.1–34.0)
MCHC: 33.1 g/dL (ref 31.5–36.0)
MCV: 88.4 fL (ref 79.5–101.0)
MONO ABS: 0.4 10*3/uL (ref 0.1–0.9)
Monocytes Relative: 9 %
NEUTROS ABS: 2.8 10*3/uL (ref 1.5–6.5)
NEUTROS PCT: 58 %
Platelets: 300 10*3/uL (ref 145–400)
RBC: 4.68 MIL/uL (ref 3.70–5.45)
RDW: 13.3 % (ref 11.2–14.5)
WBC: 4.9 10*3/uL (ref 3.9–10.3)

## 2017-09-11 LAB — COMPREHENSIVE METABOLIC PANEL
ALT: 27 U/L (ref 0–44)
ANION GAP: 8 (ref 5–15)
AST: 18 U/L (ref 15–41)
Albumin: 4.1 g/dL (ref 3.5–5.0)
Alkaline Phosphatase: 100 U/L (ref 38–126)
BUN: 10 mg/dL (ref 6–20)
CO2: 29 mmol/L (ref 22–32)
CREATININE: 0.83 mg/dL (ref 0.44–1.00)
Calcium: 10.1 mg/dL (ref 8.9–10.3)
Chloride: 103 mmol/L (ref 98–111)
Glucose, Bld: 98 mg/dL (ref 70–99)
POTASSIUM: 4.6 mmol/L (ref 3.5–5.1)
Sodium: 140 mmol/L (ref 135–145)
Total Bilirubin: 0.5 mg/dL (ref 0.3–1.2)
Total Protein: 7.6 g/dL (ref 6.5–8.1)

## 2017-09-11 MED ORDER — FULVESTRANT 250 MG/5ML IM SOLN
500.0000 mg | INTRAMUSCULAR | Status: DC
  Administered 2017-09-11: 500 mg via INTRAMUSCULAR

## 2017-09-11 NOTE — Patient Instructions (Signed)

## 2017-10-09 ENCOUNTER — Inpatient Hospital Stay: Payer: BLUE CROSS/BLUE SHIELD

## 2017-10-09 ENCOUNTER — Inpatient Hospital Stay: Payer: BLUE CROSS/BLUE SHIELD | Attending: Oncology

## 2017-10-09 DIAGNOSIS — Z5111 Encounter for antineoplastic chemotherapy: Secondary | ICD-10-CM | POA: Insufficient documentation

## 2017-10-09 DIAGNOSIS — M8580 Other specified disorders of bone density and structure, unspecified site: Secondary | ICD-10-CM

## 2017-10-09 DIAGNOSIS — Z170 Estrogen receptor positive status [ER+]: Secondary | ICD-10-CM | POA: Diagnosis not present

## 2017-10-09 DIAGNOSIS — C50411 Malignant neoplasm of upper-outer quadrant of right female breast: Secondary | ICD-10-CM | POA: Diagnosis present

## 2017-10-09 LAB — COMPREHENSIVE METABOLIC PANEL
ALT: 34 U/L (ref 0–44)
AST: 18 U/L (ref 15–41)
Albumin: 3.9 g/dL (ref 3.5–5.0)
Alkaline Phosphatase: 108 U/L (ref 38–126)
Anion gap: 13 (ref 5–15)
BILIRUBIN TOTAL: 0.3 mg/dL (ref 0.3–1.2)
BUN: 7 mg/dL (ref 6–20)
CALCIUM: 9.8 mg/dL (ref 8.9–10.3)
CO2: 26 mmol/L (ref 22–32)
CREATININE: 0.75 mg/dL (ref 0.44–1.00)
Chloride: 104 mmol/L (ref 98–111)
Glucose, Bld: 85 mg/dL (ref 70–99)
Potassium: 3.9 mmol/L (ref 3.5–5.1)
Sodium: 143 mmol/L (ref 135–145)
TOTAL PROTEIN: 8 g/dL (ref 6.5–8.1)

## 2017-10-09 LAB — CBC WITH DIFFERENTIAL/PLATELET
BASOS ABS: 0 10*3/uL (ref 0.0–0.1)
BASOS PCT: 1 %
EOS ABS: 0.2 10*3/uL (ref 0.0–0.5)
Eosinophils Relative: 6 %
HCT: 41.1 % (ref 34.8–46.6)
Hemoglobin: 14 g/dL (ref 11.6–15.9)
Lymphocytes Relative: 29 %
Lymphs Abs: 1.2 10*3/uL (ref 0.9–3.3)
MCH: 29.9 pg (ref 25.1–34.0)
MCHC: 34.1 g/dL (ref 31.5–36.0)
MCV: 87.7 fL (ref 79.5–101.0)
Monocytes Absolute: 0.4 10*3/uL (ref 0.1–0.9)
Monocytes Relative: 10 %
NEUTROS PCT: 54 %
Neutro Abs: 2.2 10*3/uL (ref 1.5–6.5)
Platelets: 329 10*3/uL (ref 145–400)
RBC: 4.68 MIL/uL (ref 3.70–5.45)
RDW: 13.6 % (ref 11.2–14.5)
WBC: 4 10*3/uL (ref 3.9–10.3)

## 2017-10-09 MED ORDER — FULVESTRANT 250 MG/5ML IM SOLN
INTRAMUSCULAR | Status: AC
  Filled 2017-10-09: qty 10

## 2017-10-09 MED ORDER — FULVESTRANT 250 MG/5ML IM SOLN
500.0000 mg | INTRAMUSCULAR | Status: DC
  Administered 2017-10-09: 500 mg via INTRAMUSCULAR

## 2017-10-09 NOTE — Patient Instructions (Signed)

## 2017-11-06 ENCOUNTER — Telehealth: Payer: Self-pay | Admitting: Adult Health

## 2017-11-06 ENCOUNTER — Inpatient Hospital Stay: Payer: BLUE CROSS/BLUE SHIELD | Attending: Oncology

## 2017-11-06 ENCOUNTER — Encounter: Payer: Self-pay | Admitting: Adult Health

## 2017-11-06 ENCOUNTER — Inpatient Hospital Stay (HOSPITAL_BASED_OUTPATIENT_CLINIC_OR_DEPARTMENT_OTHER): Payer: BLUE CROSS/BLUE SHIELD | Admitting: Adult Health

## 2017-11-06 ENCOUNTER — Inpatient Hospital Stay: Payer: BLUE CROSS/BLUE SHIELD

## 2017-11-06 VITALS — BP 133/82 | HR 84 | Temp 98.6°F | Resp 18 | Ht 67.0 in | Wt 225.0 lb

## 2017-11-06 DIAGNOSIS — Z170 Estrogen receptor positive status [ER+]: Secondary | ICD-10-CM

## 2017-11-06 DIAGNOSIS — M25561 Pain in right knee: Secondary | ICD-10-CM

## 2017-11-06 DIAGNOSIS — Z79899 Other long term (current) drug therapy: Secondary | ICD-10-CM | POA: Insufficient documentation

## 2017-11-06 DIAGNOSIS — R232 Flushing: Secondary | ICD-10-CM

## 2017-11-06 DIAGNOSIS — Z923 Personal history of irradiation: Secondary | ICD-10-CM | POA: Diagnosis not present

## 2017-11-06 DIAGNOSIS — M8580 Other specified disorders of bone density and structure, unspecified site: Secondary | ICD-10-CM | POA: Insufficient documentation

## 2017-11-06 DIAGNOSIS — Z9221 Personal history of antineoplastic chemotherapy: Secondary | ICD-10-CM

## 2017-11-06 DIAGNOSIS — K219 Gastro-esophageal reflux disease without esophagitis: Secondary | ICD-10-CM

## 2017-11-06 DIAGNOSIS — C50411 Malignant neoplasm of upper-outer quadrant of right female breast: Secondary | ICD-10-CM | POA: Insufficient documentation

## 2017-11-06 DIAGNOSIS — G8929 Other chronic pain: Secondary | ICD-10-CM

## 2017-11-06 DIAGNOSIS — Z79818 Long term (current) use of other agents affecting estrogen receptors and estrogen levels: Secondary | ICD-10-CM

## 2017-11-06 DIAGNOSIS — M67471 Ganglion, right ankle and foot: Secondary | ICD-10-CM

## 2017-11-06 DIAGNOSIS — Z7982 Long term (current) use of aspirin: Secondary | ICD-10-CM | POA: Insufficient documentation

## 2017-11-06 LAB — CBC WITH DIFFERENTIAL/PLATELET
Basophils Absolute: 0 10*3/uL (ref 0.0–0.1)
Basophils Relative: 1 %
EOS ABS: 0.4 10*3/uL (ref 0.0–0.5)
EOS PCT: 8 %
HCT: 39.2 % (ref 34.8–46.6)
HEMOGLOBIN: 13 g/dL (ref 11.6–15.9)
LYMPHS ABS: 1.5 10*3/uL (ref 0.9–3.3)
Lymphocytes Relative: 32 %
MCH: 30 pg (ref 25.1–34.0)
MCHC: 33.2 g/dL (ref 31.5–36.0)
MCV: 90.3 fL (ref 79.5–101.0)
MONOS PCT: 10 %
Monocytes Absolute: 0.5 10*3/uL (ref 0.1–0.9)
NEUTROS PCT: 49 %
Neutro Abs: 2.4 10*3/uL (ref 1.5–6.5)
Platelets: 311 10*3/uL (ref 145–400)
RBC: 4.34 MIL/uL (ref 3.70–5.45)
RDW: 13.5 % (ref 11.2–14.5)
WBC: 4.8 10*3/uL (ref 3.9–10.3)

## 2017-11-06 LAB — COMPREHENSIVE METABOLIC PANEL
ALK PHOS: 114 U/L (ref 38–126)
ALT: 22 U/L (ref 0–44)
AST: 15 U/L (ref 15–41)
Albumin: 3.7 g/dL (ref 3.5–5.0)
Anion gap: 11 (ref 5–15)
BUN: 6 mg/dL (ref 6–20)
CALCIUM: 9.7 mg/dL (ref 8.9–10.3)
CO2: 26 mmol/L (ref 22–32)
Chloride: 106 mmol/L (ref 98–111)
Creatinine, Ser: 0.77 mg/dL (ref 0.44–1.00)
GFR calc non Af Amer: >60 mL/min (ref >60)
Glucose, Bld: 94 mg/dL (ref 70–99)
Potassium: 3.8 mmol/L (ref 3.5–5.1)
SODIUM: 143 mmol/L (ref 135–145)
TOTAL PROTEIN: 7.2 g/dL (ref 6.5–8.1)
Total Bilirubin: 0.4 mg/dL (ref 0.3–1.2)

## 2017-11-06 MED ORDER — FULVESTRANT 250 MG/5ML IM SOLN
INTRAMUSCULAR | Status: AC
  Filled 2017-11-06: qty 5

## 2017-11-06 MED ORDER — GABAPENTIN 300 MG PO CAPS: 300.0000 mg | ORAL_CAPSULE | Freq: Three times a day (TID) | ORAL | 2 refills | Status: DC

## 2017-11-06 MED ORDER — FULVESTRANT 250 MG/5ML IM SOLN
500.0000 mg | INTRAMUSCULAR | Status: DC
  Administered 2017-11-06: 500 mg via INTRAMUSCULAR

## 2017-11-06 NOTE — Patient Instructions (Signed)

## 2017-11-06 NOTE — Progress Notes (Signed)
[place]  [phone] [phone]     ID: Elizabeth Miles DOB: [birth year]  MR#: [id number]  EXB#:[id number]  Patient Care Team: Clinton Quant, MD as PCP - General (Internal Medicine) Pomposini, Cherly Anderson, MD as Referring Physician (Internal Medicine) Rolm Bookbinder, MD as Consulting Physician (General Surgery) Magrinat, Virgie Dad, MD as Consulting Physician (Oncology) Delice Bison, Charlestine Massed, NP as Nurse Practitioner (Hematology and Oncology) GYN: Servando Salina MD  CHIEF COMPLAINT: right breat cancer  CURRENT TREATMENT: Fulvestrant, [denosumab/Prolia]  BREAST CANCER HISTORY: From Dr. Dana Allan earlier notes:  "Elizabeth Miles is a 52 y.o. female. Without significant past medical history who on June 2013 had a mammogram that was normal. But there was on physical exam possibility of a cyst noted in the right breast. The mammogram was negative. In 2014 June patient noted on exam another lump in the right breast. She underwent a diagnostic mammogram on June 10 that showed a right breast nodule in the outer quadrant. She had an ultrasound performed that showed at the 9:30 o'clock position a 3.6 cm area and then added 10:00 position 1.3 cm area with a total area being anywhere between 5-6 cm. The patient went on to have a right breast biopsy performed in [place]. The pathology revealed an invasive ductal carcinoma. This is been confirmed by our pathology as well. The carcinoma and papillary features and was felt to be between a grade 1 and 2. The tumor was estrogen receptor positive strongly (100%) progesterone receptor negative HER-2/neu negative with a Ki-67 that showed a high proliferation rate. Patient is now seen in medical oncology for discussion of treatment options."  Bilateral breast MRI on 08/27/2012 revealed in the right upper quadrant irregular lobulated mass with a satellite nodule or lobulation within 2 mm at its superior aspect,  measured together as 4.5 x 4.0 x 3.8 cm. There was extension of enhancement to the nipple, suggesting nipple involvement may be present. No lymphadenopathy was noted there was no any other area of abnormal enhancement in either breast (clinical stage IIA, T2 N0).  PET scan performed on 08/29/2012 revealed the primary breast cancer measuring 3.2 x 3.3 cm with SUV of 16. It was adjacent nodule along the superiomedial border of the primary mass measuring 1.4 cm which was also hypermetabolic. There were no additional areas of abnormal hypermetabolism. No abnormal hypermetabolic activity was seen in the chest, abdomen/pelvis,within the liver, pancreas, adrenal glands or spleen. No hypermetabolic lymph nodes. In the skeleton, no focal hypermetabolic activity to suggest skeletal metastasis was seen.  Completed neoadjuvant chemotherapy consisting of Q14 day Adriamycin/Cytoxan x 4 cycles on 10/25/2012, followed by one dose of neoadjuvant Taxol on 11/08/2012. She developed significant grade 2 neuropathy and Taxol was discontinued. Then received neoadjuvant chemotherapy consisting of single agent Abraxane given on day 1, 8, 15 of each 28 day cycle. She completed therapy on 11/15/12 - 02/14/13.   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 Repeat HER-2 testing was now positive. The patient was started on tamoxifen March 2015 and on Herceptin the same month.  Her subsequent history is as detailed below.  INTERVAL HISTORY: Elizabeth Miles returns today for follow-up and treatment of her stage II estrogen receptor positive breast cancer, accompanied by her mother. She receives fulvestrant with a dose due today. She tolerates this well.  Elizabeth Miles continues to have hot flashes  and wants to know if we can go up on her Gabapentin.    REVIEW OF SYSTEMS:  Elizabeth Miles is having right  groin and knee pain.  The pain started after she took Prolia back in 2017.  It has progressively worsened and nothing is improved.  She has tried advil, and tylenol without relief.  She also takes some joint medication called move free and that makes it tolerable.  She has undergone imaging of the knee with xray back in 04/2016 that was normal.    Elizabeth Miles notes a "bump" that she developed on her right foot. She notes it is on the top of her foot.  She says that she notes that her foot is bothering her when she wears shoes.  She has a h/o ganglion cysts in her wrists.     PAST MEDICAL HISTORY: Past Medical History:  Diagnosis Date  . Allergy   . Anxiety   . Breast cancer ([place]) 08/06/12   invasive ductal carcioma  . Complication of anesthesia    1986 ; problem waking up  . GERD (gastroesophageal reflux disease)   . GERD (gastroesophageal reflux disease) 08/22/2012  . Wears glasses     PAST SURGICAL HISTORY: Past Surgical History:  Procedure Laterality Date  . BREAST BIOPSY Right 08/06/12  . BREAST LUMPECTOMY WITH NEEDLE LOCALIZATION AND AXILLARY SENTINEL LYMPH NODE BX Right 03/10/2013   Procedure: BREAST LUMPECTOMY WITH NEEDLE LOCALIZATION AND AXILLARY SENTINEL LYMPH NODE BIOPSY;  Surgeon: Rolm Bookbinder, MD;  Location: [facility];  Service: General;  Laterality: Right;  . [place]  . DILATION AND CURETTAGE OF UTERUS  1993  . POPLITEAL SYNOVIAL CYST EXCISION  1970  . PORT-A-CATH REMOVAL Left 03/10/2013   Procedure: REMOVAL PORT-A-CATH;  Surgeon: Rolm Bookbinder, MD;  Location: [facility];  Service: General;  Laterality: Left;  . PORTACATH PLACEMENT Left 09/12/2012   Procedure: INSERTION PORT-A-CATH;  Surgeon: Rolm Bookbinder, MD;  Location: [facility] OR;  Service: General;  Laterality: Left;    FAMILY HISTORY Family History  Problem Relation Age of Onset  . Hypertension Mother   . Bladder Cancer Maternal Uncle 68  . Cancer Paternal Grandmother  34       lung cancer  . Lung cancer Paternal Grandfather        dx in his 83s  . COPD Paternal Uncle    the patient's parents are living and in good health. The patient has one brother, no sisters. There is no history of breast or ovarian cancer in the family to her knowledge  GYNECOLOGIC HISTORY:  Patient's last menstrual period was 03/12/2012. Menarche age 58, first live birth age 69. The patient is GX P3. 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 Her husband, Jori Moll, is a Tour manager. A daughter, Lovena Le,  lives in [place] and daughter Caryl Pina has a baby girl, the patient's first grandchild, 78 years old as of December 2017; she stays with Elizabeth Miles frequently.. Son Jori Moll "Brianne" is 33 as of December 2017 and at home. The patient is a Psychologist, forensic.    ADVANCED DIRECTIVES: Not in place   HEALTH MAINTENANCE: Social History   Tobacco Use  . Smoking status: Never Smoker  . Smokeless tobacco: Never Used  Substance Use Topics  . Alcohol use: No  . Drug use: No     Colonoscopy:  PAP:  Bone density:  Lipid panel:  Allergies  Allergen Reactions  . Anastrozole Anaphylaxis  . Darvocet [Propoxyphene N-Acetaminophen] Shortness Of Breath and Swelling  . Shellfish Allergy Shortness Of Breath and Swelling  . Other     CT dye, "chest hurt" "makes me feel cold"    Current Outpatient Medications  Medication Sig Dispense Refill  . acetaminophen (TYLENOL) 500 MG tablet Take 1,000 mg by mouth every 6 (six) hours as needed. Reported on 06/14/2015    . aspirin EC 81 MG tablet Take by mouth.    . Cholecalciferol ([employer] D3) 1000 units capsule Take 1 capsule (1,000 Units total) by mouth daily. 90 capsule 3  . Dexlansoprazole (DEXILANT) 30 MG capsule Take 1 capsule (30 mg total) by mouth daily. 90 capsule 4  . gabapentin (NEURONTIN) 100 MG capsule  TAKE 3 CAPSULES BY MOUTH 3 TIMES DAILY AS NEEDED 270 capsule 0  . gabapentin (NEURONTIN) 300 MG capsule Take 1 capsule (300 mg total) by mouth 2 (two) times daily. 180 capsule 2  . ibuprofen (ADVIL,MOTRIN) 800 MG tablet Take 800 mg by mouth every 8 (eight) hours as needed for moderate pain (for back pain). Reported on 06/14/2015    . polyethylene glycol (MIRALAX / GLYCOLAX) packet Take 17 g by mouth daily.    Marland Kitchen venlafaxine XR (EFFEXOR-XR) 150 MG 24 hr capsule Take 1 capsule (150 mg total) by mouth daily with breakfast. 90 capsule 3   No current facility-administered medications for this visit.     OBJECTIVE: Middle-aged white woman in no acute distress  Vitals:   11/06/17 1036  BP: 133/82  Pulse: 84  Resp: 18  Temp: 98.6 F (37 C)  SpO2: 98%     Body mass index is 35.24 kg/m.    ECOG FS:1 - Symptomatic but completely ambulatory Filed Weights   11/06/17 1036  Weight: 225 lb (102.1 kg)  GENERAL: Patient is a well appearing female in no acute distress HEENT:  Sclerae anicteric.  Oropharynx clear and moist. No ulcerations or evidence of oropharyngeal candidiasis. Neck is supple.  NODES:  No cervical, supraclavicular, or axillary lymphadenopathy palpated.  BREAST EXAM:  Right breast s/p lumpectomy, well healed, moderate distortion present, no nodules or masses, left breast without nodules, masses skin or nipple changes.  LUNGS:  Clear to auscultation bilaterally.  No wheezes or rhonchi. HEART:  Regular rate and rhythm. No murmur appreciated. ABDOMEN:  Soft, nontender.  Positive, normoactive bowel sounds. No organomegaly palpated. MSK:  No focal spinal tenderness to palpation. Full range of motion bilaterally in the upper extremities. EXTREMITIES:  No peripheral edema.   SKIN:  Clear with no obvious rashes or skin changes. No nail dyscrasia. NEURO:  Nonfocal. Well oriented.  Appropriate affect.    LAB RESULTS:  CMP     Component Value Date/Time   NA 143 10/09/2017 1038   NA 139  03/01/2017 1021   K 3.9 10/09/2017 1038   K 4.5 03/01/2017 1021   CL 104 10/09/2017 1038   CO2 26 10/09/2017 1038   CO2 27 03/01/2017 1021   GLUCOSE 85 10/09/2017 1038   GLUCOSE 92 03/01/2017 1021   BUN 7 10/09/2017 1038   BUN 24.0 03/01/2017 1021   CREATININE 0.75 10/09/2017 1038   CREATININE 0.8 03/01/2017 1021   CALCIUM 9.8 10/09/2017 1038   CALCIUM 9.8 03/01/2017 1021   PROT 8.0 10/09/2017 1038   PROT 7.6 03/01/2017 1021   ALBUMIN 3.9 10/09/2017 1038   ALBUMIN 4.1 03/01/2017 1021   AST 18 10/09/2017 1038  AST 13 03/01/2017 1021   ALT 34 10/09/2017 1038   ALT 17 03/01/2017 1021   ALKPHOS 108 10/09/2017 1038   ALKPHOS 94 03/01/2017 1021   BILITOT 0.3 10/09/2017 1038   BILITOT 0.45 03/01/2017 1021   GFRNONAA >60 10/09/2017 1038   GFRAA >60 10/09/2017 1038    I No results found for: SPEP  Lab Results  Component Value Date   WBC 4.8 11/06/2017   NEUTROABS 2.4 11/06/2017   HGB 13.0 11/06/2017   HCT 39.2 11/06/2017   MCV 90.3 11/06/2017   PLT 311 11/06/2017      Chemistry      Component Value Date/Time   NA 143 10/09/2017 1038   NA 139 03/01/2017 1021   K 3.9 10/09/2017 1038   K 4.5 03/01/2017 1021   CL 104 10/09/2017 1038   CO2 26 10/09/2017 1038   CO2 27 03/01/2017 1021   BUN 7 10/09/2017 1038   BUN 24.0 03/01/2017 1021   CREATININE 0.75 10/09/2017 1038   CREATININE 0.8 03/01/2017 1021      Component Value Date/Time   CALCIUM 9.8 10/09/2017 1038   CALCIUM 9.8 03/01/2017 1021   ALKPHOS 108 10/09/2017 1038   ALKPHOS 94 03/01/2017 1021   AST 18 10/09/2017 1038   AST 13 03/01/2017 1021   ALT 34 10/09/2017 1038   ALT 17 03/01/2017 1021   BILITOT 0.3 10/09/2017 1038   BILITOT 0.45 03/01/2017 1021       No results found for: LABCA2  No components found for: LABCA125  No results for input(s): INR in the last 168 hours.  Urinalysis    Component Value Date/Time   LABSPEC 1.020 09/28/2015 1224   PHURINE 5.0 09/28/2015 1224   GLUCOSEU Negative  09/28/2015 1224   HGBUR Negative 09/28/2015 1224   BILIRUBINUR Negative 09/28/2015 1224   KETONESUR Negative 09/28/2015 1224   PROTEINUR Negative 09/28/2015 1224   UROBILINOGEN 0.2 09/28/2015 1224   NITRITE Negative 09/28/2015 1224   LEUKOCYTESUR Negative 09/28/2015 1224    STUDIES:   Bilateral diagnostic mammography at [facility], with tomography, on 04/05/2017 showed the breast density to be category B.  There was no evidence of malignancy.--awaiting fax from [facility] with this so we can scan it in [facility] 11/06/17  ASSESSMENT: 52 y.o. [place] woman  (1) status post right breast upper outer quadrant biopsy 08/06/2012 for a clinical mT2 N0, stage IIA invasive ductal carcinoma, estrogen receptor 3+ positive, progesterone receptor and HER-2 negative, Ki67 3+ [[id number]]  (2) treated neoadjuvantly with dose dense cyclophosphamide and doxorubicin x4, completed 10/25/2012, followed by a single dose of weekly paclitaxel, poorly tolerated; followed by 11 doses of Abraxane completed 02/14/2013  (3) status post right lumpectomy and sentinel lymph node sampling 03/10/2013 for a ypT2 pN0, stage IIA invasive ductal carcinoma, grade 2, estrogen receptor 100% positive, progesterone receptor 21% positive, with an MIB-1 of 14%, and HER-2 amplified, the signals ratio being 2.52, the number per cell 2.90  (a) reclassified as stage IB in the 2018 prognostic classification  (4) adjuvant radiation completed March 2015 (?)  (5) started tamoxifen March 2015, stopped 05/25/14 because of superficial clot to left cephalic vein; began anastrozole on 05/29/14, discontinued after 2 weeks with rash; started letrozole 05/24/2015, discontinued after 5 days use because of angioedema-like symptoms  (6) started trastuzumab 05/23/2013, completed 05/15/14  (7) osteopenia: With T score of -1.7 on bone density scan at [facility] 06/10/2014.   (a) with dental clearance started denosumab/Prolia 12/09/2015  (b)  denosumab/Proliadiscontinued after October  2017 dose at patient's request  (8) fulvestrant started 06/23/2015  PLAN: Elizabeth Miles is doing well today.  She has no clinical or radiographic sign of recurrence.  We are awaiting her mammogram results from [facility] to be faxed over. She will continue to receive fulvestrant every 4 weeks.  I increased her gabapentin to '300mg'$  po TID.  I counseled her on health maintenance including keeping up with her other cancer screenings and healthy diet and exercise.    Elizabeth Miles has a couple of other issues she needs assistance with.  I referred her to podiatry for the cyst noted on the top of her foot.  I also referred her to ortho for her knee issue.  She may very well benefit from an injection or further evaluation.    She will return monthly for labs and fulvestrant, and will return in 6 months for evaluation by Dr. Jana Hakim.  She knows to call for any other issues that may develop before her next visit here.  A total of (30) minutes of face-to-face time was spent with this patient with greater than 50% of that time in counseling and care-coordination.   Wilber Bihari, NP 11/06/17 11:09 AM Medical Oncology and Hematology [facility] [address] [place], [place] [address] Tel. [phone]    Fax. [phone]

## 2017-11-06 NOTE — Telephone Encounter (Signed)
Per 9/10 no los 

## 2017-11-06 NOTE — Progress Notes (Signed)
Pt. Tolerated injection well, No further problem or concerns noted.

## 2017-11-26 ENCOUNTER — Other Ambulatory Visit: Payer: Self-pay | Admitting: *Deleted

## 2017-11-26 DIAGNOSIS — C50411 Malignant neoplasm of upper-outer quadrant of right female breast: Secondary | ICD-10-CM

## 2017-11-26 DIAGNOSIS — Z170 Estrogen receptor positive status [ER+]: Principal | ICD-10-CM

## 2017-11-26 MED ORDER — VENLAFAXINE HCL ER 150 MG PO CP24: 150.0000 mg | ORAL_CAPSULE | Freq: Every day | ORAL | 3 refills | Status: DC

## 2017-12-04 ENCOUNTER — Inpatient Hospital Stay: Payer: BLUE CROSS/BLUE SHIELD | Attending: Oncology

## 2017-12-04 ENCOUNTER — Inpatient Hospital Stay: Payer: BLUE CROSS/BLUE SHIELD

## 2017-12-04 VITALS — BP 132/78 | HR 83 | Temp 98.1°F | Resp 18

## 2017-12-04 DIAGNOSIS — M8580 Other specified disorders of bone density and structure, unspecified site: Secondary | ICD-10-CM | POA: Insufficient documentation

## 2017-12-04 DIAGNOSIS — C50411 Malignant neoplasm of upper-outer quadrant of right female breast: Secondary | ICD-10-CM | POA: Diagnosis not present

## 2017-12-04 DIAGNOSIS — Z170 Estrogen receptor positive status [ER+]: Secondary | ICD-10-CM | POA: Insufficient documentation

## 2017-12-04 DIAGNOSIS — Z79818 Long term (current) use of other agents affecting estrogen receptors and estrogen levels: Secondary | ICD-10-CM | POA: Diagnosis not present

## 2017-12-04 DIAGNOSIS — Z923 Personal history of irradiation: Secondary | ICD-10-CM | POA: Insufficient documentation

## 2017-12-04 DIAGNOSIS — Z9221 Personal history of antineoplastic chemotherapy: Secondary | ICD-10-CM | POA: Insufficient documentation

## 2017-12-04 LAB — CBC WITH DIFFERENTIAL/PLATELET
Abs Immature Granulocytes: 0.03 10*3/uL (ref 0.00–0.07)
BASOS ABS: 0 10*3/uL (ref 0.0–0.1)
BASOS PCT: 1 %
EOS ABS: 0.4 10*3/uL (ref 0.0–0.5)
Eosinophils Relative: 8 %
HCT: 41.2 % (ref 36.0–46.0)
HEMOGLOBIN: 13.3 g/dL (ref 12.0–15.0)
Immature Granulocytes: 1 %
LYMPHS ABS: 1.4 10*3/uL (ref 0.7–4.0)
Lymphocytes Relative: 26 %
MCH: 29.2 pg (ref 26.0–34.0)
MCHC: 32.3 g/dL (ref 30.0–36.0)
MCV: 90.5 fL (ref 80.0–100.0)
MONOS PCT: 7 %
Monocytes Absolute: 0.4 10*3/uL (ref 0.1–1.0)
NEUTROS PCT: 57 %
NRBC: 0 % (ref 0.0–0.2)
Neutro Abs: 3.2 10*3/uL (ref 1.7–7.7)
PLATELETS: 308 10*3/uL (ref 150–400)
RBC: 4.55 MIL/uL (ref 3.87–5.11)
RDW: 13.2 % (ref 11.5–15.5)
WBC: 5.5 10*3/uL (ref 4.0–10.5)

## 2017-12-04 LAB — COMPREHENSIVE METABOLIC PANEL
ALBUMIN: 3.7 g/dL (ref 3.5–5.0)
ALK PHOS: 100 U/L (ref 38–126)
ALT: 19 U/L (ref 0–44)
ANION GAP: 8 (ref 5–15)
AST: 17 U/L (ref 15–41)
BUN: 8 mg/dL (ref 6–20)
CHLORIDE: 106 mmol/L (ref 98–111)
CO2: 28 mmol/L (ref 22–32)
Calcium: 9.7 mg/dL (ref 8.9–10.3)
Creatinine, Ser: 0.79 mg/dL (ref 0.44–1.00)
GFR calc Af Amer: >60 mL/min (ref >60)
GFR calc non Af Amer: >60 mL/min (ref >60)
GLUCOSE: 91 mg/dL (ref 70–99)
POTASSIUM: 4.3 mmol/L (ref 3.5–5.1)
SODIUM: 142 mmol/L (ref 135–145)
Total Bilirubin: 0.4 mg/dL (ref 0.3–1.2)
Total Protein: 7.3 g/dL (ref 6.5–8.1)

## 2017-12-04 MED ORDER — FULVESTRANT 250 MG/5ML IM SOLN
500.0000 mg | INTRAMUSCULAR | Status: DC
  Administered 2017-12-04: 500 mg via INTRAMUSCULAR

## 2017-12-04 MED ORDER — FULVESTRANT 250 MG/5ML IM SOLN
INTRAMUSCULAR | Status: AC
  Filled 2017-12-04: qty 10

## 2017-12-06 ENCOUNTER — Encounter: Payer: Self-pay | Admitting: Podiatry

## 2017-12-06 ENCOUNTER — Ambulatory Visit (INDEPENDENT_AMBULATORY_CARE_PROVIDER_SITE_OTHER): Payer: BLUE CROSS/BLUE SHIELD

## 2017-12-06 ENCOUNTER — Other Ambulatory Visit: Payer: Self-pay | Admitting: Podiatry

## 2017-12-06 ENCOUNTER — Ambulatory Visit (INDEPENDENT_AMBULATORY_CARE_PROVIDER_SITE_OTHER): Payer: BLUE CROSS/BLUE SHIELD | Admitting: Podiatry

## 2017-12-06 VITALS — BP 127/83 | HR 104 | Resp 16

## 2017-12-06 DIAGNOSIS — M79671 Pain in right foot: Secondary | ICD-10-CM

## 2017-12-06 DIAGNOSIS — M67471 Ganglion, right ankle and foot: Secondary | ICD-10-CM

## 2017-12-06 DIAGNOSIS — B351 Tinea unguium: Secondary | ICD-10-CM | POA: Diagnosis not present

## 2017-12-06 NOTE — Progress Notes (Signed)
Subjective:   Patient ID: Elizabeth Miles, female   DOB: 52 y.o.   MRN: [id number]   HPI Patient presents with a mass on the dorsal foot that was referred to [place] with concerns because she is had a history of cancer.  Patient has been treated successfully at this point for breast cancer with chemotherapy and other treatments that she states she is noticed this over the last few months.  Patient does not smoke likes to be active   Review of Systems  All other systems reviewed and are negative.       Objective:  Physical Exam  Constitutional: She appears well-developed and well-nourished.  Cardiovascular: Intact distal pulses.  Pulmonary/Chest: Effort normal.  Musculoskeletal: Normal range of motion.  Neurological: She is alert.  Skin: Skin is warm.  Nursing note and vitals reviewed.   Neurovascular status intact muscle strength is adequate range of motion within normal limits with patient noted to have mass dorsal aspect of the right foot measuring 1.2 x 1 cm that appears to be freely movable within subcutaneous tissue.  Patient has good digital perfusion is well oriented x3     Assessment:  Probability for ganglionic cyst dorsal aspect right foot     Plan:  H&P x-ray reviewed and recommended drainage of this area.  I went ahead and I did a proximal nerve block of the area sterile prep applied to the area and using a 20-gauge needle 10 cc syringe I aspirated this getting out approximately a cc of gelatinous fluid consistent with ganglionic cyst.  I then expressed the remainder of the fluid I applied sterile dressing with compression and advised her to compress and hopefully this will not recur and if it does she will be seen back  X-ray indicates that there is no signs of spur or bone encroachment within this area

## 2017-12-06 NOTE — Progress Notes (Signed)
   Subjective:    Patient ID: Elizabeth Miles, female    DOB: March 12, 1965, 52 y.o.   MRN: [id number]  HPI    Review of Systems  All other systems reviewed and are negative.      Objective:   Physical Exam        Assessment & Plan:

## 2017-12-31 ENCOUNTER — Other Ambulatory Visit: Payer: Self-pay

## 2017-12-31 DIAGNOSIS — C50411 Malignant neoplasm of upper-outer quadrant of right female breast: Secondary | ICD-10-CM

## 2017-12-31 DIAGNOSIS — Z170 Estrogen receptor positive status [ER+]: Principal | ICD-10-CM

## 2018-01-01 ENCOUNTER — Encounter: Payer: Self-pay | Admitting: Adult Health

## 2018-01-01 ENCOUNTER — Inpatient Hospital Stay: Payer: BLUE CROSS/BLUE SHIELD

## 2018-01-01 ENCOUNTER — Inpatient Hospital Stay (HOSPITAL_BASED_OUTPATIENT_CLINIC_OR_DEPARTMENT_OTHER): Payer: BLUE CROSS/BLUE SHIELD | Admitting: Adult Health

## 2018-01-01 ENCOUNTER — Telehealth: Payer: Self-pay | Admitting: Adult Health

## 2018-01-01 ENCOUNTER — Inpatient Hospital Stay: Payer: BLUE CROSS/BLUE SHIELD | Attending: Oncology

## 2018-01-01 VITALS — BP 113/78 | HR 88

## 2018-01-01 VITALS — BP 133/89 | HR 95 | Temp 98.3°F | Resp 18

## 2018-01-01 DIAGNOSIS — Z170 Estrogen receptor positive status [ER+]: Secondary | ICD-10-CM

## 2018-01-01 DIAGNOSIS — R51 Headache: Secondary | ICD-10-CM | POA: Diagnosis not present

## 2018-01-01 DIAGNOSIS — Z9221 Personal history of antineoplastic chemotherapy: Secondary | ICD-10-CM

## 2018-01-01 DIAGNOSIS — R0602 Shortness of breath: Secondary | ICD-10-CM | POA: Insufficient documentation

## 2018-01-01 DIAGNOSIS — C50411 Malignant neoplasm of upper-outer quadrant of right female breast: Secondary | ICD-10-CM

## 2018-01-01 DIAGNOSIS — R519 Headache, unspecified: Secondary | ICD-10-CM

## 2018-01-01 DIAGNOSIS — Z923 Personal history of irradiation: Secondary | ICD-10-CM

## 2018-01-01 DIAGNOSIS — Z79810 Long term (current) use of selective estrogen receptor modulators (SERMs): Secondary | ICD-10-CM | POA: Insufficient documentation

## 2018-01-01 DIAGNOSIS — M8580 Other specified disorders of bone density and structure, unspecified site: Secondary | ICD-10-CM | POA: Diagnosis not present

## 2018-01-01 DIAGNOSIS — N644 Mastodynia: Secondary | ICD-10-CM

## 2018-01-01 DIAGNOSIS — R0609 Other forms of dyspnea: Secondary | ICD-10-CM

## 2018-01-01 LAB — CBC WITH DIFFERENTIAL (CANCER CENTER ONLY)
Abs Immature Granulocytes: 0.03 10*3/uL (ref 0.00–0.07)
BASOS PCT: 1 %
Basophils Absolute: 0.1 10*3/uL (ref 0.0–0.1)
EOS ABS: 0.4 10*3/uL (ref 0.0–0.5)
Eosinophils Relative: 7 %
HCT: 43.9 % (ref 36.0–46.0)
Hemoglobin: 13.7 g/dL (ref 12.0–15.0)
IMMATURE GRANULOCYTES: 1 %
Lymphocytes Relative: 29 %
Lymphs Abs: 1.5 10*3/uL (ref 0.7–4.0)
MCH: 28.5 pg (ref 26.0–34.0)
MCHC: 31.2 g/dL (ref 30.0–36.0)
MCV: 91.5 fL (ref 80.0–100.0)
MONO ABS: 0.4 10*3/uL (ref 0.1–1.0)
MONOS PCT: 8 %
NEUTROS PCT: 54 %
Neutro Abs: 2.8 10*3/uL (ref 1.7–7.7)
Platelet Count: 340 10*3/uL (ref 150–400)
RBC: 4.8 MIL/uL (ref 3.87–5.11)
RDW: 12.9 % (ref 11.5–15.5)
WBC Count: 5.1 10*3/uL (ref 4.0–10.5)
nRBC: 0 % (ref 0.0–0.2)

## 2018-01-01 LAB — CMP (CANCER CENTER ONLY)
ALT: 28 U/L (ref 0–44)
AST: 23 U/L (ref 15–41)
Albumin: 3.8 g/dL (ref 3.5–5.0)
Alkaline Phosphatase: 107 U/L (ref 38–126)
Anion gap: 8 (ref 5–15)
BILIRUBIN TOTAL: 0.4 mg/dL (ref 0.3–1.2)
BUN: 8 mg/dL (ref 6–20)
CO2: 29 mmol/L (ref 22–32)
Calcium: 9.8 mg/dL (ref 8.9–10.3)
Chloride: 104 mmol/L (ref 98–111)
Creatinine: 0.81 mg/dL (ref 0.44–1.00)
GFR, Est AFR Am: >60 mL/min (ref >60)
Glucose, Bld: 96 mg/dL (ref 70–99)
POTASSIUM: 4.4 mmol/L (ref 3.5–5.1)
Sodium: 141 mmol/L (ref 135–145)
TOTAL PROTEIN: 7.6 g/dL (ref 6.5–8.1)

## 2018-01-01 MED ORDER — PREDNISONE 50 MG PO TABS: ORAL_TABLET | ORAL | 0 refills | Status: DC

## 2018-01-01 MED ORDER — FULVESTRANT 250 MG/5ML IM SOLN
INTRAMUSCULAR | Status: AC
  Filled 2018-01-01: qty 5

## 2018-01-01 MED ORDER — FULVESTRANT 250 MG/5ML IM SOLN
500.0000 mg | INTRAMUSCULAR | Status: DC
  Administered 2018-01-01: 500 mg via INTRAMUSCULAR

## 2018-01-01 NOTE — Progress Notes (Signed)
[place]  Telephone:([phone]) [phone] Fax:(336) [phone]     ID: Elizabeth Miles DOB: [birth year]  MR#: [id number]  HWY#:[id number]  Patient Care Team: Clinton Quant, MD as PCP - General (Internal Medicine) Pomposini, Cherly Anderson, MD as Referring Physician (Internal Medicine) Rolm Bookbinder, MD as Consulting Physician (General Surgery) Magrinat, Virgie Dad, MD as Consulting Physician (Oncology) Delice Bison, Charlestine Massed, NP as Nurse Practitioner (Hematology and Oncology) GYN: Servando Salina MD  CHIEF COMPLAINT: right breat cancer  CURRENT TREATMENT: Fulvestrant, [denosumab/Prolia]  BREAST CANCER HISTORY: From Dr. Dana Allan earlier notes:  "Elizabeth Miles is a 52 y.o. female. Without significant past medical history who on June 2013 had a mammogram that was normal. But there was on physical exam possibility of a cyst noted in the right breast. The mammogram was negative. In 2014 June patient noted on exam another lump in the right breast. She underwent a diagnostic mammogram on June 10 that showed a right breast nodule in the outer quadrant. She had an ultrasound performed that showed at the 9:30 o'clock position a 3.6 cm area and then added 10:00 position 1.3 cm area with a total area being anywhere between 5-6 cm. The patient went on to have a right breast biopsy performed in [place]. The pathology revealed an invasive ductal carcinoma. This is been confirmed by our pathology as well. The carcinoma and papillary features and was felt to be between a grade 1 and 2. The tumor was estrogen receptor positive strongly (100%) progesterone receptor negative HER-2/neu negative with a Ki-67 that showed a high proliferation rate. Patient is now seen in medical oncology for discussion of treatment options."  Bilateral breast MRI on 08/27/2012 revealed in the right upper quadrant irregular lobulated mass with a satellite nodule or lobulation within 2 mm at its superior aspect,  measured together as 4.5 x 4.0 x 3.8 cm. There was extension of enhancement to the nipple, suggesting nipple involvement may be present. No lymphadenopathy was noted there was no any other area of abnormal enhancement in either breast (clinical stage IIA, T2 N0).  PET scan performed on 08/29/2012 revealed the primary breast cancer measuring 3.2 x 3.3 cm with SUV of 16. It was adjacent nodule along the superiomedial border of the primary mass measuring 1.4 cm which was also hypermetabolic. There were no additional areas of abnormal hypermetabolism. No abnormal hypermetabolic activity was seen in the chest, abdomen/pelvis,within the liver, pancreas, adrenal glands or spleen. No hypermetabolic lymph nodes. In the skeleton, no focal hypermetabolic activity to suggest skeletal metastasis was seen.  Completed neoadjuvant chemotherapy consisting of Q14 day Adriamycin/Cytoxan x 4 cycles on 10/25/2012, followed by one dose of neoadjuvant Taxol on 11/08/2012. She developed significant grade 2 neuropathy and Taxol was discontinued. Then received neoadjuvant chemotherapy consisting of single agent Abraxane given on day 1, 8, 15 of each 28 day cycle. She completed therapy on 11/15/12 - 02/14/13.   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 Repeat HER-2 testing was now positive. The patient was started on tamoxifen March 2015 and on Herceptin the same month.  Her subsequent history is as detailed below.  INTERVAL HISTORY: Elizabeth Miles is here today after being asked to be added on for new symptoms.  She has ER positive breast cancer and receives Fulvestrant monthly.  She is here today for fulvestrant.  Elizabeth Miles has issues with new onset  of headaches x 2 weeks.  These are terrible and wake her up in the middle of the night.  These happen about 3-4 times per week and occur in  the frontal region and are described as a non radiating aching, that are about 6/10.  She notes increased fatigue, and dizziness, but denies vertigo.    REVIEW OF SYSTEMS:  Elizabeth Miles also notes a new onset of dyspnea on exertion.  This is slightly worse than it was previously.  She notes a new onset right breast pain near her axilla, that is much more severe than it was previously.  She denies any headaches, palpitations, cough, recent URI, or abdominal concerns.  A detailed ROS was otherwise non contributory.     PAST MEDICAL HISTORY: Past Medical History:  Diagnosis Date  . Allergy   . Anxiety   . Breast cancer ([place]) 08/06/12   invasive ductal carcioma  . Complication of anesthesia    1986 ; problem waking up  . GERD (gastroesophageal reflux disease)   . GERD (gastroesophageal reflux disease) 08/22/2012  . Wears glasses     PAST SURGICAL HISTORY: Past Surgical History:  Procedure Laterality Date  . BREAST BIOPSY Right 08/06/12  . BREAST LUMPECTOMY WITH NEEDLE LOCALIZATION AND AXILLARY SENTINEL LYMPH NODE BX Right 03/10/2013   Procedure: BREAST LUMPECTOMY WITH NEEDLE LOCALIZATION AND AXILLARY SENTINEL LYMPH NODE BIOPSY;  Surgeon: Rolm Bookbinder, MD;  Location: [facility];  Service: General;  Laterality: Right;  . [place]  . DILATION AND CURETTAGE OF UTERUS  1993  . POPLITEAL SYNOVIAL CYST EXCISION  1970  . PORT-A-CATH REMOVAL Left 03/10/2013   Procedure: REMOVAL PORT-A-CATH;  Surgeon: Rolm Bookbinder, MD;  Location: [facility];  Service: General;  Laterality: Left;  . PORTACATH PLACEMENT Left 09/12/2012   Procedure: INSERTION PORT-A-CATH;  Surgeon: Rolm Bookbinder, MD;  Location: [facility] OR;  Service: General;  Laterality: Left;    FAMILY HISTORY Family History  Problem Relation Age of Onset  . Hypertension Mother   . Bladder Cancer Maternal Uncle 68  . Cancer Paternal Grandmother 66       lung cancer  . Lung cancer Paternal Grandfather         dx in his 19s  . COPD Paternal Uncle    the patient's parents are living and in good health. The patient has one brother, no sisters. There is no history of breast or ovarian cancer in the family to her knowledge  GYNECOLOGIC HISTORY:  Patient's last menstrual period was 03/12/2012. Menarche age 48, first live birth age 32. The patient is GX P3. 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 Her husband, Jori Moll, is a Tour manager. A daughter, Lovena Le,  lives in [place] and daughter Caryl Pina has a baby girl, the patient's first grandchild, 33 years old as of December 2017; she stays with Coralyn Mark frequently.. Son Jori Moll "Brianne" is 46 as of December 2017 and at home. The patient is a Psychologist, forensic.    ADVANCED DIRECTIVES: Not in place   HEALTH MAINTENANCE: Social History   Tobacco Use  . Smoking status: Never Smoker  . Smokeless tobacco: Never Used  Substance Use Topics  . Alcohol use: No  . Drug use: No     Colonoscopy:  PAP:  Bone density:  Lipid panel:  Allergies  Allergen Reactions  . Anastrozole Anaphylaxis  . Darvocet [  Propoxyphene N-Acetaminophen] Shortness Of Breath and Swelling  . Propoxyphene Anaphylaxis, Shortness Of Breath and Swelling  . Shellfish Allergy Shortness Of Breath and Swelling  . Other     CT dye, "chest hurt" "makes me feel cold"    Current Outpatient Medications  Medication Sig Dispense Refill  . acetaminophen (TYLENOL) 500 MG tablet Take 1,000 mg by mouth every 6 (six) hours as needed. Reported on 06/14/2015    . aspirin EC 81 MG tablet Take by mouth.    . Cholecalciferol ([employer] D3) 1000 units capsule Take 1 capsule (1,000 Units total) by mouth daily. 90 capsule 3  . Dexlansoprazole (DEXILANT) 30 MG capsule Take 1 capsule (30 mg total) by mouth daily. 90 capsule 4  . gabapentin (NEURONTIN) 300 MG capsule  Take 1 capsule (300 mg total) by mouth 3 (three) times daily. 180 capsule 2  . ibuprofen (ADVIL,MOTRIN) 800 MG tablet Take 800 mg by mouth every 8 (eight) hours as needed for moderate pain (for back pain). Reported on 06/14/2015    . polyethylene glycol (MIRALAX / GLYCOLAX) packet Take 17 g by mouth daily.    . predniSONE (DELTASONE) 50 MG tablet Take 50 mg 13, 7, and 1 hours before CT contrast injection 3 tablet 0  . venlafaxine XR (EFFEXOR-XR) 150 MG 24 hr capsule Take 1 capsule (150 mg total) by mouth daily with breakfast. 90 capsule 3   No current facility-administered medications for this visit.    Facility-Administered Medications Ordered in Other Visits  Medication Dose Route Frequency Provider Last Rate Last Dose  . fulvestrant (FASLODEX) injection 500 mg  500 mg Intramuscular Q30 days Magrinat, Virgie Dad, MD   500 mg at 01/01/18 1157    OBJECTIVE: Middle-aged white woman in no acute distress  There were no vitals filed for this visit.   There is no height or weight on file to calculate BMI.    ECOG FS:1 - Symptomatic but completely ambulatory There were no vitals filed for this visit.GENERAL: Patient is a well appearing female in no acute distress HEENT:  Sclerae anicteric.  Oropharynx clear and moist. No ulcerations or evidence of oropharyngeal candidiasis. Neck is supple.  NODES:  No cervical, supraclavicular, or axillary lymphadenopathy palpated.  BREAST EXAM:  Right breast s/p lumpectomy, significant tenderness in right breast between 10 and 11 o'clock near axilla about 5-7cmfn, left breast benign LUNGS:  Clear to auscultation bilaterally.  No wheezes or rhonchi. HEART:  Regular rate and rhythm. No murmur appreciated. ABDOMEN:  Soft, nontender.  Positive, normoactive bowel sounds. No organomegaly palpated. MSK:  No focal spinal tenderness to palpation. Full range of motion bilaterally in the upper extremities. EXTREMITIES:  No peripheral edema.   SKIN:  Clear with no obvious  rashes or skin changes. No nail dyscrasia. NEURO:  Nonfocal. Well oriented.  Appropriate affect.    LAB RESULTS:  CMP     Component Value Date/Time   NA 141 01/01/2018 1018   NA 139 03/01/2017 1021   K 4.4 01/01/2018 1018   K 4.5 03/01/2017 1021   CL 104 01/01/2018 1018   CO2 29 01/01/2018 1018   CO2 27 03/01/2017 1021   GLUCOSE 96 01/01/2018 1018   GLUCOSE 92 03/01/2017 1021   BUN 8 01/01/2018 1018   BUN 24.0 03/01/2017 1021   CREATININE 0.81 01/01/2018 1018   CREATININE 0.8 03/01/2017 1021   CALCIUM 9.8 01/01/2018 1018   CALCIUM 9.8 03/01/2017 1021   PROT 7.6 01/01/2018 1018   PROT 7.6 03/01/2017 1021  ALBUMIN 3.8 01/01/2018 1018   ALBUMIN 4.1 03/01/2017 1021   AST 23 01/01/2018 1018   AST 13 03/01/2017 1021   ALT 28 01/01/2018 1018   ALT 17 03/01/2017 1021   ALKPHOS 107 01/01/2018 1018   ALKPHOS 94 03/01/2017 1021   BILITOT 0.4 01/01/2018 1018   BILITOT 0.45 03/01/2017 1021   GFRNONAA >60 01/01/2018 1018   GFRAA >60 01/01/2018 1018    I No results found for: SPEP  Lab Results  Component Value Date   WBC 5.1 01/01/2018   NEUTROABS 2.8 01/01/2018   HGB 13.7 01/01/2018   HCT 43.9 01/01/2018   MCV 91.5 01/01/2018   PLT 340 01/01/2018      Chemistry      Component Value Date/Time   NA 141 01/01/2018 1018   NA 139 03/01/2017 1021   K 4.4 01/01/2018 1018   K 4.5 03/01/2017 1021   CL 104 01/01/2018 1018   CO2 29 01/01/2018 1018   CO2 27 03/01/2017 1021   BUN 8 01/01/2018 1018   BUN 24.0 03/01/2017 1021   CREATININE 0.81 01/01/2018 1018   CREATININE 0.8 03/01/2017 1021      Component Value Date/Time   CALCIUM 9.8 01/01/2018 1018   CALCIUM 9.8 03/01/2017 1021   ALKPHOS 107 01/01/2018 1018   ALKPHOS 94 03/01/2017 1021   AST 23 01/01/2018 1018   AST 13 03/01/2017 1021   ALT 28 01/01/2018 1018   ALT 17 03/01/2017 1021   BILITOT 0.4 01/01/2018 1018   BILITOT 0.45 03/01/2017 1021       No results found for: LABCA2  No components found for:  LABCA125  No results for input(s): INR in the last 168 hours.  Urinalysis    Component Value Date/Time   LABSPEC 1.020 09/28/2015 1224   PHURINE 5.0 09/28/2015 1224   GLUCOSEU Negative 09/28/2015 1224   HGBUR Negative 09/28/2015 1224   BILIRUBINUR Negative 09/28/2015 1224   KETONESUR Negative 09/28/2015 1224   PROTEINUR Negative 09/28/2015 1224   UROBILINOGEN 0.2 09/28/2015 1224   NITRITE Negative 09/28/2015 1224   LEUKOCYTESUR Negative 09/28/2015 1224    STUDIES:     ASSESSMENT: 52 y.o. [place] woman  (1) status post right breast upper outer quadrant biopsy 08/06/2012 for a clinical mT2 N0, stage IIA invasive ductal carcinoma, estrogen receptor 3+ positive, progesterone receptor and HER-2 negative, Ki67 3+ [[id number]]  (2) treated neoadjuvantly with dose dense cyclophosphamide and doxorubicin x4, completed 10/25/2012, followed by a single dose of weekly paclitaxel, poorly tolerated; followed by 11 doses of Abraxane completed 02/14/2013  (3) status post right lumpectomy and sentinel lymph node sampling 03/10/2013 for a ypT2 pN0, stage IIA invasive ductal carcinoma, grade 2, estrogen receptor 100% positive, progesterone receptor 21% positive, with an MIB-1 of 14%, and HER-2 amplified, the signals ratio being 2.52, the number per cell 2.90  (a) reclassified as stage IB in the 2018 prognostic classification  (4) adjuvant radiation completed March 2015 (?)  (5) started tamoxifen March 2015, stopped 05/25/14 because of superficial clot to left cephalic vein; began anastrozole on 05/29/14, discontinued after 2 weeks with rash; started letrozole 05/24/2015, discontinued after 5 days use because of angioedema-like symptoms  (6) started trastuzumab 05/23/2013, completed 05/15/14  (7) osteopenia: With T score of -1.7 on bone density scan at [facility] 06/10/2014.   (a) with dental clearance started denosumab/Prolia 12/09/2015  (b) denosumab/Prolia discontinued after October 2017  dose at patient's request  (8) fulvestrant started 06/23/2015  PLAN: Elizabeth Miles is having issues with  several new symptoms.  Due to her symptoms, we will order restaging scans to ensure there is no cancer.  I have ordered a mammogram and ultrasound of the right breast to fully evaluate the area.  I have ordered CT chest to evaluate the shortness of breath (also ordered CT prep with prednisone, as patient tells me she has reacted to contrast in past, but typically doesn't receive a prep).  I have also ordered MRI brain to evaluate the new headaches.  We will review once she has all of those done, hopefully before her injections next month.  She will return monthly for labs and fulvestrant, and will return in 04/2018 for evaluation by Dr. Jana Hakim.  She knows to call for any other issues that may develop before her next visit here.  A total of (30) minutes of face-to-face time was spent with this patient with greater than 50% of that time in counseling and care-coordination.   Wilber Bihari, NP 01/01/18 2:00 PM Medical Oncology and Hematology [facility] [address] [place], [place] [address] Tel. [phone]    Fax. [phone]

## 2018-01-01 NOTE — Patient Instructions (Signed)
Take Prednisone 50 mg 13, 7, and 1 hours before CT contrast injection Take Benadryl 50mg  within one hour of CT contrast injection

## 2018-01-01 NOTE — Telephone Encounter (Signed)
No 11/5 los orders.

## 2018-01-01 NOTE — Patient Instructions (Signed)

## 2018-01-08 ENCOUNTER — Encounter: Payer: Self-pay | Admitting: Adult Health

## 2018-01-09 ENCOUNTER — Other Ambulatory Visit: Payer: Self-pay | Admitting: Adult Health

## 2018-01-09 DIAGNOSIS — C8591 Non-Hodgkin lymphoma, unspecified, lymph nodes of head, face, and neck: Secondary | ICD-10-CM

## 2018-01-09 DIAGNOSIS — Z170 Estrogen receptor positive status [ER+]: Principal | ICD-10-CM

## 2018-01-09 DIAGNOSIS — C50411 Malignant neoplasm of upper-outer quadrant of right female breast: Secondary | ICD-10-CM

## 2018-01-10 ENCOUNTER — Other Ambulatory Visit: Payer: Self-pay | Admitting: *Deleted

## 2018-01-11 ENCOUNTER — Other Ambulatory Visit: Payer: Self-pay | Admitting: Adult Health

## 2018-01-11 MED ORDER — GABAPENTIN 300 MG PO CAPS: 300.0000 mg | ORAL_CAPSULE | Freq: Three times a day (TID) | ORAL | 1 refills | Status: DC

## 2018-01-18 ENCOUNTER — Ambulatory Visit (HOSPITAL_COMMUNITY): Payer: BLUE CROSS/BLUE SHIELD

## 2018-01-21 ENCOUNTER — Ambulatory Visit
Admission: RE | Admit: 2018-01-21 | Discharge: 2018-01-21 | Disposition: A | Discharge status: Home / Self Care | Payer: BLUE CROSS/BLUE SHIELD | Source: Ambulatory Visit | Attending: Adult Health | Admitting: Adult Health

## 2018-01-21 DIAGNOSIS — R519 Headache, unspecified: Secondary | ICD-10-CM

## 2018-01-21 DIAGNOSIS — C50411 Malignant neoplasm of upper-outer quadrant of right female breast: Secondary | ICD-10-CM

## 2018-01-21 DIAGNOSIS — R51 Headache: Secondary | ICD-10-CM

## 2018-01-21 DIAGNOSIS — Z170 Estrogen receptor positive status [ER+]: Principal | ICD-10-CM

## 2018-01-21 MED ORDER — GADOBENATE DIMEGLUMINE 529 MG/ML IV SOLN
20.0000 mL | Freq: Once | INTRAVENOUS | Status: AC | PRN
  Administered 2018-01-21: 20 mL via INTRAVENOUS

## 2018-01-22 ENCOUNTER — Telehealth: Payer: Self-pay | Admitting: Adult Health

## 2018-01-22 ENCOUNTER — Other Ambulatory Visit: Payer: Self-pay | Admitting: Adult Health

## 2018-01-22 DIAGNOSIS — R519 Headache, unspecified: Secondary | ICD-10-CM

## 2018-01-22 DIAGNOSIS — R51 Headache: Principal | ICD-10-CM

## 2018-01-22 DIAGNOSIS — C50411 Malignant neoplasm of upper-outer quadrant of right female breast: Secondary | ICD-10-CM

## 2018-01-22 DIAGNOSIS — Z170 Estrogen receptor positive status [ER+]: Principal | ICD-10-CM

## 2018-01-22 NOTE — Telephone Encounter (Signed)
Reviewed MRI results with patient.  No sign of cancer.  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 Reviewed that if she is continuing to have headaches she would benefit from eval from neurology.  Patient does not want to undergo CT chest with contrast.  She is concerned about the CT chest with contrast due to h/o chest pressure with receiving IV dye, even with the steroid prep.   Order changed to CT chest w/o contrast.  Noted to [place] that this is not ideal.  Coralyn Mark also requested I review her mammogram and ultrasound and we discussed her repeat breast ultrasound that will be due at [facility] in 03/2018.  I ordered the repeat ultrasound and my nurse Cecille Rubin was asked to fax orders to [facility].  Wilber Bihari, NP

## 2018-01-23 ENCOUNTER — Encounter: Payer: Self-pay | Admitting: Oncology

## 2018-01-29 ENCOUNTER — Inpatient Hospital Stay: Payer: BLUE CROSS/BLUE SHIELD | Attending: Oncology

## 2018-01-29 ENCOUNTER — Inpatient Hospital Stay: Payer: BLUE CROSS/BLUE SHIELD

## 2018-01-29 DIAGNOSIS — C50411 Malignant neoplasm of upper-outer quadrant of right female breast: Secondary | ICD-10-CM | POA: Insufficient documentation

## 2018-01-29 DIAGNOSIS — M8580 Other specified disorders of bone density and structure, unspecified site: Secondary | ICD-10-CM

## 2018-01-29 DIAGNOSIS — Z170 Estrogen receptor positive status [ER+]: Secondary | ICD-10-CM | POA: Insufficient documentation

## 2018-01-29 DIAGNOSIS — Z79810 Long term (current) use of selective estrogen receptor modulators (SERMs): Secondary | ICD-10-CM | POA: Diagnosis not present

## 2018-01-29 DIAGNOSIS — Z9221 Personal history of antineoplastic chemotherapy: Secondary | ICD-10-CM | POA: Insufficient documentation

## 2018-01-29 DIAGNOSIS — Z923 Personal history of irradiation: Secondary | ICD-10-CM | POA: Insufficient documentation

## 2018-01-29 LAB — CBC WITH DIFFERENTIAL (CANCER CENTER ONLY)
ABS IMMATURE GRANULOCYTES: 0.02 10*3/uL (ref 0.00–0.07)
BASOS ABS: 0 10*3/uL (ref 0.0–0.1)
Basophils Relative: 0 %
Eosinophils Absolute: 0.2 10*3/uL (ref 0.0–0.5)
Eosinophils Relative: 5 %
HEMATOCRIT: 40.5 % (ref 36.0–46.0)
Hemoglobin: 13.2 g/dL (ref 12.0–15.0)
IMMATURE GRANULOCYTES: 0 %
LYMPHS ABS: 1.2 10*3/uL (ref 0.7–4.0)
LYMPHS PCT: 27 %
MCH: 29.4 pg (ref 26.0–34.0)
MCHC: 32.6 g/dL (ref 30.0–36.0)
MCV: 90.2 fL (ref 80.0–100.0)
Monocytes Absolute: 0.4 10*3/uL (ref 0.1–1.0)
Monocytes Relative: 9 %
NEUTROS ABS: 2.6 10*3/uL (ref 1.7–7.7)
NRBC: 0 % (ref 0.0–0.2)
Neutrophils Relative %: 59 %
Platelet Count: 287 10*3/uL (ref 150–400)
RBC: 4.49 MIL/uL (ref 3.87–5.11)
RDW: 12.9 % (ref 11.5–15.5)
WBC Count: 4.5 10*3/uL (ref 4.0–10.5)

## 2018-01-29 LAB — CMP (CANCER CENTER ONLY)
ALT: 32 U/L (ref 0–44)
AST: 22 U/L (ref 15–41)
Albumin: 3.7 g/dL (ref 3.5–5.0)
Alkaline Phosphatase: 96 U/L (ref 38–126)
Anion gap: 8 (ref 5–15)
BUN: 8 mg/dL (ref 6–20)
CHLORIDE: 107 mmol/L (ref 98–111)
CO2: 28 mmol/L (ref 22–32)
CREATININE: 0.77 mg/dL (ref 0.44–1.00)
Calcium: 9.6 mg/dL (ref 8.9–10.3)
GFR, Estimated: >60 mL/min (ref >60)
Glucose, Bld: 91 mg/dL (ref 70–99)
POTASSIUM: 4.1 mmol/L (ref 3.5–5.1)
SODIUM: 143 mmol/L (ref 135–145)
Total Bilirubin: 0.4 mg/dL (ref 0.3–1.2)
Total Protein: 7.2 g/dL (ref 6.5–8.1)

## 2018-01-29 MED ORDER — FULVESTRANT 250 MG/5ML IM SOLN
500.0000 mg | INTRAMUSCULAR | Status: DC
  Administered 2018-01-29: 500 mg via INTRAMUSCULAR

## 2018-01-29 MED ORDER — FULVESTRANT 250 MG/5ML IM SOLN
INTRAMUSCULAR | Status: AC
  Filled 2018-01-29: qty 10

## 2018-01-29 NOTE — Progress Notes (Signed)
Pt received Fulvestrant injection and stated injection burned more than usual. Pt also c/o still feeling a burning feeling after injection was administered. Dr. Jana Hakim desk called and notified. Ethal Gotay LPN

## 2018-02-11 ENCOUNTER — Encounter (HOSPITAL_COMMUNITY): Payer: Self-pay

## 2018-02-11 ENCOUNTER — Ambulatory Visit (HOSPITAL_COMMUNITY)
Admission: RE | Admit: 2018-02-11 | Discharge: 2018-02-11 | Disposition: A | Discharge status: Home / Self Care | Payer: BLUE CROSS/BLUE SHIELD | Source: Ambulatory Visit | Attending: Adult Health | Admitting: Adult Health

## 2018-02-11 ENCOUNTER — Telehealth: Payer: Self-pay

## 2018-02-11 ENCOUNTER — Encounter: Payer: Self-pay | Admitting: Neurology

## 2018-02-11 DIAGNOSIS — C50411 Malignant neoplasm of upper-outer quadrant of right female breast: Secondary | ICD-10-CM | POA: Diagnosis not present

## 2018-02-11 DIAGNOSIS — Z170 Estrogen receptor positive status [ER+]: Secondary | ICD-10-CM | POA: Insufficient documentation

## 2018-02-11 NOTE — Telephone Encounter (Signed)
Referral faxed for Neurology appointment  today at [phone]

## 2018-02-11 NOTE — Telephone Encounter (Signed)
TC to Pt. About CT Scan results Per [name] CT Scan results are normal, Pt. Verbalized understanding.

## 2018-02-11 NOTE — Telephone Encounter (Signed)
TC from Pt. Inquiring about referral to Neurology appointment, she stated she has not heard anything about the appointment. TC to [facility] Inquiring about Pt's appointment. Spoke to [place] who stated referral was not assigned to anyone referral faxed over and appointment made for 04/17/17 Pt informed.

## 2018-02-12 ENCOUNTER — Other Ambulatory Visit (HOSPITAL_COMMUNITY): Payer: BLUE CROSS/BLUE SHIELD

## 2018-02-13 ENCOUNTER — Other Ambulatory Visit: Payer: Self-pay | Admitting: Oncology

## 2018-02-14 ENCOUNTER — Ambulatory Visit (HOSPITAL_COMMUNITY): Payer: BLUE CROSS/BLUE SHIELD

## 2018-02-26 ENCOUNTER — Inpatient Hospital Stay: Payer: BLUE CROSS/BLUE SHIELD

## 2018-02-26 VITALS — BP 123/88 | HR 88 | Temp 98.1°F | Resp 18

## 2018-02-26 DIAGNOSIS — C50411 Malignant neoplasm of upper-outer quadrant of right female breast: Secondary | ICD-10-CM

## 2018-02-26 DIAGNOSIS — M8580 Other specified disorders of bone density and structure, unspecified site: Secondary | ICD-10-CM

## 2018-02-26 DIAGNOSIS — Z170 Estrogen receptor positive status [ER+]: Secondary | ICD-10-CM

## 2018-02-26 LAB — CMP (CANCER CENTER ONLY)
ALBUMIN: 3.7 g/dL (ref 3.5–5.0)
ALK PHOS: 107 U/L (ref 38–126)
ALT: 26 U/L (ref 0–44)
AST: 20 U/L (ref 15–41)
Anion gap: 9 (ref 5–15)
BILIRUBIN TOTAL: 0.3 mg/dL (ref 0.3–1.2)
BUN: 7 mg/dL (ref 6–20)
CALCIUM: 9.4 mg/dL (ref 8.9–10.3)
CO2: 26 mmol/L (ref 22–32)
CREATININE: 0.84 mg/dL (ref 0.44–1.00)
Chloride: 106 mmol/L (ref 98–111)
GFR, Est AFR Am: >60 mL/min (ref >60)
GFR, Estimated: >60 mL/min (ref >60)
GLUCOSE: 101 mg/dL — AB (ref 70–99)
Potassium: 4.5 mmol/L (ref 3.5–5.1)
Sodium: 141 mmol/L (ref 135–145)
TOTAL PROTEIN: 7.3 g/dL (ref 6.5–8.1)

## 2018-02-26 LAB — CBC WITH DIFFERENTIAL (CANCER CENTER ONLY)
Abs Immature Granulocytes: 0.02 10*3/uL (ref 0.00–0.07)
BASOS ABS: 0 10*3/uL (ref 0.0–0.1)
Basophils Relative: 1 %
EOS PCT: 6 %
Eosinophils Absolute: 0.3 10*3/uL (ref 0.0–0.5)
HEMATOCRIT: 42.9 % (ref 36.0–46.0)
HEMOGLOBIN: 13.5 g/dL (ref 12.0–15.0)
IMMATURE GRANULOCYTES: 0 %
LYMPHS ABS: 1.5 10*3/uL (ref 0.7–4.0)
LYMPHS PCT: 30 %
MCH: 28.8 pg (ref 26.0–34.0)
MCHC: 31.5 g/dL (ref 30.0–36.0)
MCV: 91.5 fL (ref 80.0–100.0)
Monocytes Absolute: 0.5 10*3/uL (ref 0.1–1.0)
Monocytes Relative: 11 %
NRBC: 0 % (ref 0.0–0.2)
Neutro Abs: 2.5 10*3/uL (ref 1.7–7.7)
Neutrophils Relative %: 52 %
Platelet Count: 303 10*3/uL (ref 150–400)
RBC: 4.69 MIL/uL (ref 3.87–5.11)
RDW: 13 % (ref 11.5–15.5)
WBC Count: 4.9 10*3/uL (ref 4.0–10.5)

## 2018-02-26 MED ORDER — FULVESTRANT 250 MG/5ML IM SOLN
500.0000 mg | INTRAMUSCULAR | Status: DC
  Administered 2018-02-26: 500 mg via INTRAMUSCULAR

## 2018-02-26 MED ORDER — FULVESTRANT 250 MG/5ML IM SOLN
INTRAMUSCULAR | Status: AC
  Filled 2018-02-26: qty 10

## 2018-03-26 ENCOUNTER — Inpatient Hospital Stay: Payer: BLUE CROSS/BLUE SHIELD | Attending: Oncology

## 2018-03-26 ENCOUNTER — Inpatient Hospital Stay: Payer: BLUE CROSS/BLUE SHIELD

## 2018-03-26 DIAGNOSIS — M8580 Other specified disorders of bone density and structure, unspecified site: Secondary | ICD-10-CM

## 2018-03-26 DIAGNOSIS — Z9221 Personal history of antineoplastic chemotherapy: Secondary | ICD-10-CM | POA: Diagnosis not present

## 2018-03-26 DIAGNOSIS — C50411 Malignant neoplasm of upper-outer quadrant of right female breast: Secondary | ICD-10-CM | POA: Insufficient documentation

## 2018-03-26 DIAGNOSIS — Z170 Estrogen receptor positive status [ER+]: Secondary | ICD-10-CM | POA: Diagnosis not present

## 2018-03-26 DIAGNOSIS — Z79818 Long term (current) use of other agents affecting estrogen receptors and estrogen levels: Secondary | ICD-10-CM | POA: Diagnosis not present

## 2018-03-26 DIAGNOSIS — Z923 Personal history of irradiation: Secondary | ICD-10-CM | POA: Diagnosis not present

## 2018-03-26 LAB — CMP (CANCER CENTER ONLY)
ALBUMIN: 3.8 g/dL (ref 3.5–5.0)
ALK PHOS: 106 U/L (ref 38–126)
ALT: 29 U/L (ref 0–44)
ANION GAP: 9 (ref 5–15)
AST: 20 U/L (ref 15–41)
BUN: 10 mg/dL (ref 6–20)
CHLORIDE: 105 mmol/L (ref 98–111)
CO2: 28 mmol/L (ref 22–32)
Calcium: 9.7 mg/dL (ref 8.9–10.3)
Creatinine: 0.78 mg/dL (ref 0.44–1.00)
GFR, Est AFR Am: >60 mL/min (ref >60)
GFR, Estimated: >60 mL/min (ref >60)
GLUCOSE: 85 mg/dL (ref 70–99)
Potassium: 4.3 mmol/L (ref 3.5–5.1)
SODIUM: 142 mmol/L (ref 135–145)
TOTAL PROTEIN: 7.3 g/dL (ref 6.5–8.1)
Total Bilirubin: 0.3 mg/dL (ref 0.3–1.2)

## 2018-03-26 LAB — CBC WITH DIFFERENTIAL (CANCER CENTER ONLY)
Abs Immature Granulocytes: 0.02 10*3/uL (ref 0.00–0.07)
BASOS PCT: 1 %
Basophils Absolute: 0.1 10*3/uL (ref 0.0–0.1)
EOS ABS: 0.4 10*3/uL (ref 0.0–0.5)
Eosinophils Relative: 7 %
HCT: 41 % (ref 36.0–46.0)
Hemoglobin: 13.4 g/dL (ref 12.0–15.0)
IMMATURE GRANULOCYTES: 0 %
Lymphocytes Relative: 29 %
Lymphs Abs: 1.6 10*3/uL (ref 0.7–4.0)
MCH: 30 pg (ref 26.0–34.0)
MCHC: 32.7 g/dL (ref 30.0–36.0)
MCV: 91.7 fL (ref 80.0–100.0)
MONO ABS: 0.5 10*3/uL (ref 0.1–1.0)
MONOS PCT: 8 %
NEUTROS ABS: 3.2 10*3/uL (ref 1.7–7.7)
NEUTROS PCT: 55 %
PLATELETS: 322 10*3/uL (ref 150–400)
RBC: 4.47 MIL/uL (ref 3.87–5.11)
RDW: 13.2 % (ref 11.5–15.5)
WBC Count: 5.7 10*3/uL (ref 4.0–10.5)
nRBC: 0 % (ref 0.0–0.2)

## 2018-03-26 MED ORDER — FULVESTRANT 250 MG/5ML IM SOLN
INTRAMUSCULAR | Status: AC
  Filled 2018-03-26: qty 5

## 2018-03-26 MED ORDER — FULVESTRANT 250 MG/5ML IM SOLN
500.0000 mg | INTRAMUSCULAR | Status: DC
  Administered 2018-03-26: 500 mg via INTRAMUSCULAR

## 2018-04-09 ENCOUNTER — Encounter: Payer: Self-pay | Admitting: Oncology

## 2018-04-16 NOTE — Progress Notes (Signed)
NEUROLOGY CONSULTATION NOTE  TERIANNA PEGGS MRN: [id number] DOB: 1965/10/05  Referring provider: Gardenia Phlegm, NP Primary care provider: Margaretha Sheffield, MD  Reason for consult:  headache  HISTORY OF PRESENT ILLNESS: Elizabeth Miles is a 53 year old right-handed Caucasian woman with breast cancer who presents for headaches.  History supplemented by referring provider notes.  She began having chronic headaches since childhood.  They subsequently stopped in her 40s.  They returned in late-2015 to early 2016 after finishing chemotherapy for breast cancer.  They are moderate (rarely severe) dull throbbing left parietal and occipital region on top of head.  They are associated with nausea, photophobia, phonophobia, foggy vision.  They are not associated with unilateral numbness or weakness.  They typically last all day, often waking up with them.  They typically occur 3 to 4 days week.  They are triggered by nothing in particular.  They are relieved by Motrin (first line) and Tylenol (2nd line about 4 hours later).  There is no preceding premonitory stage or aura.  There is no postdrome.   Around the same time, she developed balance problems.  While walking, she may suddenly lose her balance or veer to either side.  It is associated with lightheadedness and feeling hot.  She often has hot flashes.  It may happen with or without the headache.  It lasts a couple minutes and occurs maybe 1 to 2 times a week.  After the chemotherapy, she would get episodic left facial numbness that spread to the back of her tongue.  It was determined to be a side effect of Herceptin.  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  She also has some memory issues.  MRI of brain with and without contrast from 01/21/18 was personally reviewed and demonstrated mild chronic small vessel ischemic changes but no metastases or other acute intracranial abnormality.  Current NSAIDS:  ASA  81mg , ibuprofen 800mg  Current analgesics:  acetaminophen Current triptans:  none Current ergotamine:  none Current anti-emetic:  none Current muscle relaxants:  none Current anti-anxiolytic:  none Current sleep aide:  none Current Antihypertensive medications:  none Current Antidepressant medications:  Venlafaxine XR 150mg  daily Current Anticonvulsant medications:  Gabapentin 300mg  three times daily (for neuropathy) Current anti-CGRP:  none Current Vitamins/Herbal/Supplements:  none Current Antihistamines/Decongestants:  none Other therapy:  none  Past NSAIDS:  none Past analgesics:  none Past abortive triptans:  none Past abortive ergotamine:  none Past muscle relaxants:  none Past anti-emetic:  none Past antihypertensive medications:  none Past antidepressant medications:  none Past anticonvulsant medications:  none Past anti-CGRP:  none Past vitamins/Herbal/Supplements:  none Past antihistamines/decongestants:  none Other past therapies:  none  CBC and CMP from 03/26/18 were normal.  PAST MEDICAL HISTORY: Past Medical History:  Diagnosis Date  . Allergy   . Anxiety   . 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 Complication of anesthesia    1986 ; problem waking up  . GERD (gastroesophageal reflux disease)   . GERD (gastroesophageal reflux disease) 08/22/2012  . Wears glasses     PAST SURGICAL HISTORY: Past Surgical History:  Procedure Laterality Date  . BREAST BIOPSY Right 08/06/12  . BREAST LUMPECTOMY WITH NEEDLE LOCALIZATION AND AXILLARY SENTINEL LYMPH NODE BX Right 03/10/2013   Procedure: BREAST LUMPECTOMY WITH NEEDLE LOCALIZATION AND AXILLARY SENTINEL LYMPH NODE BIOPSY;  Surgeon: Rolm Bookbinder, MD;  Location: [facility];  Service: General;  Laterality: Right;  .  CERVICAL ABLATION  1983  . DILATION AND CURETTAGE OF UTERUS  1993  . POPLITEAL SYNOVIAL CYST EXCISION  1970  . PORT-A-CATH REMOVAL Left 03/10/2013   Procedure:  REMOVAL PORT-A-CATH;  Surgeon: Rolm Bookbinder, MD;  Location: [facility];  Service: General;  Laterality: Left;  . PORTACATH PLACEMENT Left 09/12/2012   Procedure: INSERTION PORT-A-CATH;  Surgeon: Rolm Bookbinder, MD;  Location: [facility] OR;  Service: General;  Laterality: Left;    MEDICATIONS: Current Outpatient Medications on File Prior to Visit  Medication Sig Dispense Refill  . acetaminophen (TYLENOL) 500 MG tablet Take 1,000 mg by mouth every 6 (six) hours as needed. Reported on 06/14/2015    . aspirin EC 81 MG tablet Take by mouth.    . Cholecalciferol ([employer] D3) 1000 units capsule Take 1 capsule (1,000 Units total) by mouth daily. 90 capsule 3  . Dexlansoprazole (DEXILANT) 30 MG capsule Take 1 capsule (30 mg total) by mouth daily. 90 capsule 4  . gabapentin (NEURONTIN) 300 MG capsule Take 1 capsule (300 mg total) by mouth 3 (three) times daily. 180 capsule 1  . ibuprofen (ADVIL,MOTRIN) 800 MG tablet Take 800 mg by mouth every 8 (eight) hours as needed for moderate pain (for back pain). Reported on 06/14/2015    . polyethylene glycol (MIRALAX / GLYCOLAX) packet Take 17 g by mouth daily.    . predniSONE (DELTASONE) 50 MG tablet Take 50 mg 13, 7, and 1 hours before CT contrast injection 3 tablet 0  . venlafaxine XR (EFFEXOR-XR) 150 MG 24 hr capsule Take 1 capsule (150 mg total) by mouth daily with breakfast. 90 capsule 3   No current facility-administered medications on file prior to visit.     ALLERGIES: Allergies  Allergen Reactions  . Anastrozole Anaphylaxis  . Contrast Media [Iodinated Diagnostic Agents] Shortness Of Breath    Difficulty breathing with chills on 08/29/2012  . Darvocet [Propoxyphene N-Acetaminophen] Shortness Of Breath and Swelling  . Propoxyphene Anaphylaxis, Shortness Of Breath and Swelling  . Shellfish Allergy Shortness Of Breath and Swelling  . Other     CT dye, "chest hurt" "makes me feel cold"    FAMILY HISTORY: Family History  Problem Relation  Age of Onset  . Hypertension Mother   . Bladder Cancer Maternal Uncle 68  . Cancer Paternal Grandmother 72       lung cancer  . Lung cancer Paternal Grandfather        dx in his 60s  . COPD Paternal Uncle     SOCIAL HISTORY: Social History   Socioeconomic History  . Marital status: Married    Spouse name: Not on file  . Number of children: 3  . Years of education: Not on file  . Highest education level: Not on file  Occupational History  . Not on file  Social Needs  . Financial resource strain: Not on file  . Food insecurity:    Worry: Not on file    Inability: Not on file  . Transportation needs:    Medical: Not on file    Non-medical: Not on file  Tobacco Use  . Smoking status: Never Smoker  . Smokeless tobacco: Never Used  Substance and Sexual Activity  . Alcohol use: No  . Drug use: No  . Sexual activity: Yes    Comment: peri menopausal  Lifestyle  . Physical activity:    Days per week: Not on file    Minutes per session: Not on file  . Stress: Not on  file  Relationships  . Social connections:    Talks on phone: Not on file    Gets together: Not on file    Attends religious service: Not on file    Active member of club or organization: Not on file    Attends meetings of clubs or organizations: Not on file    Relationship status: Not on file  . Intimate partner violence:    Fear of current or ex partner: Not on file    Emotionally abused: Not on file    Physically abused: Not on file    Forced sexual activity: Not on file  Other Topics Concern  . Not on file  Social History Narrative  . Not on file    REVIEW OF SYSTEMS: Constitutional: No fevers, chills, or sweats, no generalized fatigue, change in appetite Eyes: No visual changes, double vision, eye pain Ear, nose and throat: No hearing loss, ear pain, nasal congestion, sore throat Cardiovascular: No chest pain, palpitations Respiratory:  No shortness of breath at rest or with exertion,  wheezes GastrointestinaI: No nausea, vomiting, diarrhea, abdominal pain, fecal incontinence Genitourinary:  No dysuria, urinary retention or frequency Musculoskeletal:  No neck pain, back pain Integumentary: No rash, pruritus, skin lesions Neurological: as above Psychiatric: No depression, insomnia, anxiety Endocrine: No palpitations, fatigue, diaphoresis, mood swings, change in appetite, change in weight, increased thirst Hematologic/Lymphatic:  No purpura, petechiae. Allergic/Immunologic: no itchy/runny eyes, nasal congestion, recent allergic reactions, rashes  PHYSICAL EXAM: Blood pressure 118/88, pulse (!) 117, height 5\' 6"  (1.676 m), weight 231 lb (104.8 kg), last menstrual period 03/12/2012, SpO2 97 %. General: No acute distress.  Patient appears well-groomed.  Head:  Normocephalic/atraumatic Eyes:  fundi examined but not visualized Neck: supple, no paraspinal tenderness, full range of motion Back: No paraspinal tenderness Heart: regular rate and rhythm Lungs: Clear to auscultation bilaterally. Vascular: No carotid bruits. Neurological Exam: Mental status: alert and oriented to person, place, and time, recent and remote memory intact, fund of knowledge intact, attention and concentration intact, speech fluent and not dysarthric, language intact. Cranial nerves: CN I: not tested CN II: pupils equal, round and reactive to light, visual fields intact CN III, IV, VI:  full range of motion, no nystagmus, no ptosis CN V: facial sensation intact CN VII: upper and lower face symmetric CN VIII: hearing intact CN IX, X: gag intact, uvula midline CN XI: sternocleidomastoid and trapezius muscles intact CN XII: tongue midline Bulk & Tone: normal, no fasciculations. Motor:  5/5 throughout  Sensation:  Pinprick sensation reduced in fingers; otherwise pinprick and vibration sensation intact. . Deep Tendon Reflexes:  Absent, toes downgoing.  Finger to nose testing:  Without dysmetria.   Heel to shin:  Without dysmetria.  Gait:  Normal station and stride.  Able to turn and tandem walk. Romberg negative.  IMPRESSION: 1.  Migraine without aura, without status migrainosus, not intractable 2.  Dizziness.  Not neurologic.  May be related to hot flashes 3.  Neuropathy  PLAN: 1.  She would like to try supplements first:  She will try magnesium citrate 400mg  to 600mg  daily, B2 400mg  daily and CoQ10 100mg  three times daily.  If ineffective, she may contact us to start an anti-CGRP 2.  Limit use of pain relievers to no more than 2 days out of week to prevent risk of rebound or medication-overuse headache. 3.  Keep headache diary 4.  Follow up in 4 months.  Thank you for allowing me to take part in the care  of this patient.  Metta Clines, DO  CC:  Gardenia Phlegm, NP  Margaretha Sheffield, MD

## 2018-04-17 ENCOUNTER — Encounter

## 2018-04-17 ENCOUNTER — Ambulatory Visit (INDEPENDENT_AMBULATORY_CARE_PROVIDER_SITE_OTHER): Payer: BLUE CROSS/BLUE SHIELD | Admitting: Neurology

## 2018-04-17 ENCOUNTER — Encounter: Payer: Self-pay | Admitting: Neurology

## 2018-04-17 VITALS — BP 118/88 | HR 117 | Ht 66.0 in | Wt 231.0 lb

## 2018-04-17 DIAGNOSIS — R42 Dizziness and giddiness: Secondary | ICD-10-CM

## 2018-04-17 DIAGNOSIS — T451X5A Adverse effect of antineoplastic and immunosuppressive drugs, initial encounter: Secondary | ICD-10-CM | POA: Diagnosis not present

## 2018-04-17 DIAGNOSIS — G620 Drug-induced polyneuropathy: Secondary | ICD-10-CM

## 2018-04-17 DIAGNOSIS — G43009 Migraine without aura, not intractable, without status migrainosus: Secondary | ICD-10-CM | POA: Diagnosis not present

## 2018-04-17 NOTE — Patient Instructions (Signed)
1.  Take magnesium citrate 400mg  to 600mg  daily, riboflavin (B2) 400mg  daily and coenzyme Q10 100mg  three times daily. 3.  Limit use of pain relievers to no more than 2 days out of the week.  These medications include acetaminophen, ibuprofen, triptans and narcotics.  This will help reduce risk of rebound headaches. 4.  Be aware of common food triggers such as processed sweets, processed foods with nitrites (such as deli meat, hot dogs, sausages), foods with MSG, alcohol (such as wine), chocolate, certain cheeses, certain fruits (dried fruits, bananas, pineapple), vinegar, diet soda. 5.  If you would like to start one of the new migraine shots, contact e 6.  Otherwise, follow up in 4 months.

## 2018-04-23 ENCOUNTER — Inpatient Hospital Stay: Payer: BLUE CROSS/BLUE SHIELD

## 2018-04-23 ENCOUNTER — Inpatient Hospital Stay: Payer: BLUE CROSS/BLUE SHIELD | Attending: Oncology

## 2018-04-23 DIAGNOSIS — C50411 Malignant neoplasm of upper-outer quadrant of right female breast: Secondary | ICD-10-CM

## 2018-04-23 DIAGNOSIS — Z5111 Encounter for antineoplastic chemotherapy: Secondary | ICD-10-CM | POA: Diagnosis present

## 2018-04-23 DIAGNOSIS — Z170 Estrogen receptor positive status [ER+]: Secondary | ICD-10-CM

## 2018-04-23 DIAGNOSIS — M8580 Other specified disorders of bone density and structure, unspecified site: Secondary | ICD-10-CM

## 2018-04-23 LAB — CMP (CANCER CENTER ONLY)
ALBUMIN: 4 g/dL (ref 3.5–5.0)
ALK PHOS: 101 U/L (ref 38–126)
ALT: 34 U/L (ref 0–44)
AST: 23 U/L (ref 15–41)
Anion gap: 9 (ref 5–15)
BUN: 6 mg/dL (ref 6–20)
CALCIUM: 9.6 mg/dL (ref 8.9–10.3)
CHLORIDE: 103 mmol/L (ref 98–111)
CO2: 28 mmol/L (ref 22–32)
CREATININE: 0.84 mg/dL (ref 0.44–1.00)
GFR, Estimated: >60 mL/min (ref >60)
GLUCOSE: 113 mg/dL — AB (ref 70–99)
Potassium: 4.5 mmol/L (ref 3.5–5.1)
SODIUM: 140 mmol/L (ref 135–145)
Total Bilirubin: 0.4 mg/dL (ref 0.3–1.2)
Total Protein: 7.6 g/dL (ref 6.5–8.1)

## 2018-04-23 LAB — CBC WITH DIFFERENTIAL (CANCER CENTER ONLY)
ABS IMMATURE GRANULOCYTES: 0.02 10*3/uL (ref 0.00–0.07)
Basophils Absolute: 0.1 10*3/uL (ref 0.0–0.1)
Basophils Relative: 1 %
Eosinophils Absolute: 0.4 10*3/uL (ref 0.0–0.5)
Eosinophils Relative: 6 %
HCT: 43.7 % (ref 36.0–46.0)
Hemoglobin: 14 g/dL (ref 12.0–15.0)
IMMATURE GRANULOCYTES: 0 %
LYMPHS ABS: 1.5 10*3/uL (ref 0.7–4.0)
LYMPHS PCT: 24 %
MCH: 29.4 pg (ref 26.0–34.0)
MCHC: 32 g/dL (ref 30.0–36.0)
MCV: 91.8 fL (ref 80.0–100.0)
MONO ABS: 0.4 10*3/uL (ref 0.1–1.0)
MONOS PCT: 7 %
NEUTROS ABS: 3.9 10*3/uL (ref 1.7–7.7)
NEUTROS PCT: 62 %
PLATELETS: 340 10*3/uL (ref 150–400)
RBC: 4.76 MIL/uL (ref 3.87–5.11)
RDW: 12.9 % (ref 11.5–15.5)
WBC Count: 6.3 10*3/uL (ref 4.0–10.5)
nRBC: 0 % (ref 0.0–0.2)

## 2018-04-23 MED ORDER — FULVESTRANT 250 MG/5ML IM SOLN
500.0000 mg | INTRAMUSCULAR | Status: DC
  Administered 2018-04-23: 500 mg via INTRAMUSCULAR

## 2018-04-23 MED ORDER — FULVESTRANT 250 MG/5ML IM SOLN
INTRAMUSCULAR | Status: AC
  Filled 2018-04-23: qty 5

## 2018-05-20 NOTE — Progress Notes (Signed)
[place]  Telephone:([phone]) [phone] Fax:(336) [phone]    ID: Elizabeth Miles DOB: 1965/12/05  MR#: [id number]  JYN#:[id number]  Patient Care Team: Clinton Quant, MD as PCP - General (Internal Medicine) Pomposini, Cherly Anderson, MD as Referring Physician (Internal Medicine) Rolm Bookbinder, MD as Consulting Physician (General Surgery) , Virgie Dad, MD as Consulting Physician (Oncology) Delice Bison, Charlestine Massed, NP as Nurse Practitioner (Hematology and Oncology) Pieter Partridge, DO as Consulting Physician (Neurology) GYN: Servando Salina MD   CHIEF COMPLAINT: estrogen receptor positive breast cancer  CURRENT TREATMENT: Fulvestrant   BREAST CANCER HISTORY: From Dr. Dana Allan earlier notes:  "Elizabeth Miles is a 53 y.o. female. Without significant past medical history who on June 2013 had a mammogram that was normal. But there was on physical exam possibility of a cyst noted in the right breast. The mammogram was negative. In 2014 June patient noted on exam another lump in the right breast. She underwent a diagnostic mammogram on June 10 that showed a right breast nodule in the outer quadrant. She had an ultrasound performed that showed at the 9:30 o'clock position a 3.6 cm area and then added 10:00 position 1.3 cm area with a total area being anywhere between 5-6 cm. The patient went on to have a right breast biopsy performed in [place]. The pathology revealed an invasive ductal carcinoma. This is been confirmed by our pathology as well. The carcinoma and papillary features and was felt to be between a grade 1 and 2. The tumor was estrogen receptor positive strongly (100%) progesterone receptor negative HER-2/neu negative with a Ki-67 that showed a high proliferation rate. Patient is now seen in medical oncology for discussion of treatment options."  Bilateral breast MRI on 08/27/2012 revealed in the right upper quadrant irregular lobulated mass with a satellite  nodule or lobulation within 2 mm at its superior aspect, measured together as 4.5 x 4.0 x 3.8 cm. There was extension of enhancement to the nipple, suggesting nipple involvement may be present. No lymphadenopathy was noted there was no any other area of abnormal enhancement in either breast (clinical stage IIA, T2 N0).  PET scan performed on 08/29/2012 revealed the primary breast cancer measuring 3.2 x 3.3 cm with SUV of 16. It was adjacent nodule along the superiomedial border of the primary mass measuring 1.4 cm which was also hypermetabolic. There were no additional areas of abnormal hypermetabolism. No abnormal hypermetabolic activity was seen in the chest, abdomen/pelvis,within the liver, pancreas, adrenal glands or spleen. No hypermetabolic lymph nodes. In the skeleton, no focal hypermetabolic activity to suggest skeletal metastasis was seen.  Completed neoadjuvant chemotherapy consisting of Q14 day Adriamycin/Cytoxan x 4 cycles on 10/25/2012, followed by one dose of neoadjuvant Taxol on 11/08/2012. She developed significant grade 2 neuropathy and Taxol was discontinued. Then received neoadjuvant chemotherapy consisting of single agent Abraxane given on day 1, 8, 15 of each 28 day cycle. She completed therapy on 11/15/12 - 02/14/13.   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 Repeat HER-2 testing was now positive. The patient was started on tamoxifen March 2015 and on Herceptin the same month.  Her subsequent history is as detailed below.   INTERVAL HISTORY: Elizabeth Miles returns today for follow-up and treatment of her estrogen receptor positive ductal carcinoma.   She continues on fulvestrant. She notes that for two days following treatment,  she has insomnia. She also has decreased taste for about a week following. Recently, Elizabeth Miles has been getting  headaches following treatment, usually only a few days; she says this is exacerbated by stress.   Since her last visit here, she underwent a brain MRI with and without contrast on 01/21/2018 showing: No evidence of intracranial metastases or acute abnormality. Mildly progressive chronic small vessel ischemic disease.  She also underwent a chest CT without contrast on 02/11/2018 showing: Status post right breast lumpectomy. No evidence of recurrent or metastatic disease. No CT abnormality of the right sternoclavicular joint to correspond to the potential sonographic finding. No evidence of acute cardiopulmonary disease.   REVIEW OF SYSTEMS:  Elizabeth Miles has been stressed due to her ongoing separation with her husband. She is worried about her insurance.  Her antidepressant was changed to generic and she found that she was crying all the time and being very irritable.  They switched it back to brand and she is now again doing fine.  For exercise, she has been walking on the treadmill. She continues to have some pain in her right ribs, that she describes as a sharp pain that she has to twist and massage to get out. The patient denies unusual visual changes, nausea, vomiting, or dizziness. There has been no unusual cough, phlegm production, or pleurisy. This been no change in bowel or bladder habits. The patient denies unexplained weight loss, bleeding, rash, or fever. A detailed review of systems was otherwise noncontributory.    PAST MEDICAL HISTORY: Past Medical History:  Diagnosis Date  . Allergy   . Anxiety   . Breast cancer ([place]) 08/06/12   invasive ductal carcioma  . Complication of anesthesia    1986 ; problem waking up  . GERD (gastroesophageal reflux disease)   . GERD (gastroesophageal reflux disease) 08/22/2012  . Wears glasses     PAST SURGICAL HISTORY: Past Surgical History:  Procedure Laterality Date  . BREAST BIOPSY Right 08/06/12  . BREAST LUMPECTOMY WITH NEEDLE LOCALIZATION AND  AXILLARY SENTINEL LYMPH NODE BX Right 03/10/2013   Procedure: BREAST LUMPECTOMY WITH NEEDLE LOCALIZATION AND AXILLARY SENTINEL LYMPH NODE BIOPSY;  Surgeon: Rolm Bookbinder, MD;  Location: [facility];  Service: General;  Laterality: Right;  . [place]  . DILATION AND CURETTAGE OF UTERUS  1993  . POPLITEAL SYNOVIAL CYST EXCISION  1970  . PORT-A-CATH REMOVAL Left 03/10/2013   Procedure: REMOVAL PORT-A-CATH;  Surgeon: Rolm Bookbinder, MD;  Location: [facility];  Service: General;  Laterality: Left;  . PORTACATH PLACEMENT Left 09/12/2012   Procedure: INSERTION PORT-A-CATH;  Surgeon: Rolm Bookbinder, MD;  Location: [facility];  Service: General;  Laterality: Left;    FAMILY HISTORY Family History  Problem Relation Age of Onset  . Hypertension Mother   . Uterine cancer Mother   . Heart disease Mother   . Bladder Cancer Maternal Uncle 68  . Cancer Paternal Grandmother 76       lung cancer  . Lung cancer Paternal Grandfather        dx in his 57s  . Prostate cancer Father   . COPD Paternal Uncle    The patient's parents are living and in good health. The patient has one brother, no sisters. There is no history of breast or ovarian cancer in the family to her knowledge   GYNECOLOGIC HISTORY:  Patient's last menstrual period was 03/12/2012. Menarche age 69, first live birth age 11. The patient is GX P3. 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 She is separated from her husband, Elizabeth Miles, who is a Tour manager. Nikkita's daughter, Elizabeth Miles, lives in [place] and daughter Elizabeth Miles has a baby girl, the patient's first grandchild, 74 years old as of December 2020; she stays with Elizabeth Miles frequently. Kirstan's son, Elizabeth Miles "Carlynn Spry" is 35 as of December 2020 and lives at home. The patient is  a Psychologist, forensic.    ADVANCED DIRECTIVES: Elizabeth Miles was given the appropriate forms on 05/21/2018 to fill out and return at their own discretion.     HEALTH MAINTENANCE: Social History   Tobacco Use  . Smoking status: Never Smoker  . Smokeless tobacco: Never Used  Substance Use Topics  . Alcohol use: No  . Drug use: No     Colonoscopy:  PAP:  Bone density:  Lipid panel:  Allergies  Allergen Reactions  . Anastrozole Anaphylaxis  . Contrast Media [Iodinated Diagnostic Agents] Shortness Of Breath    Difficulty breathing with chills on 08/29/2012  . Darvocet [Propoxyphene N-Acetaminophen] Shortness Of Breath and Swelling  . Propoxyphene Anaphylaxis, Shortness Of Breath and Swelling  . Shellfish Allergy Shortness Of Breath and Swelling  . Other     CT dye, "chest hurt" "makes me feel cold"    Current Outpatient Medications  Medication Sig Dispense Refill  . acetaminophen (TYLENOL) 500 MG tablet Take 1,000 mg by mouth every 6 (six) hours as needed. Reported on 06/14/2015    . aspirin EC 81 MG tablet Take by mouth.    . cetirizine (ZYRTEC) 10 MG chewable tablet Chew 10 mg by mouth daily.    . Cholecalciferol ([employer] D3) 1000 units capsule Take 1 capsule (1,000 Units total) by mouth daily. 90 capsule 3  . Dexlansoprazole (DEXILANT) 30 MG capsule Take 1 capsule (30 mg total) by mouth daily. 90 capsule 4  . gabapentin (NEURONTIN) 300 MG capsule Take 1 capsule (300 mg total) by mouth 3 (three) times daily. 180 capsule 1  . ibuprofen (ADVIL,MOTRIN) 800 MG tablet Take 800 mg by mouth every 8 (eight) hours as needed for moderate pain (for back pain). Reported on 06/14/2015    . polyethylene glycol (MIRALAX / GLYCOLAX) packet Take 17 g by mouth daily.    . predniSONE (DELTASONE) 50 MG tablet Take 50 mg 13, 7, and 1 hours before CT contrast injection 3 tablet 0  . venlafaxine XR (EFFEXOR-XR) 150 MG 24 hr capsule Take 1 capsule (150 mg total) by mouth daily with breakfast. 90 capsule 3   No current  facility-administered medications for this visit.     OBJECTIVE: Middle-aged white woman who appears stated age  53:   05/21/18 1045  BP: 126/90  Pulse: 93  Resp: 17  Temp: 98.7 F (37.1 C)  SpO2: 97%     Body mass index is 38.38 kg/m.    ECOG FS:1 - Symptomatic but completely ambulatory Filed Weights   05/21/18 1045  Weight: 237 lb 12.8 oz (107.9 kg)   Sclerae unicteric, EOMs intact No cervical or supraclavicular adenopathy Lungs no rales or rhonchi Heart regular rate and rhythm Abd soft, nontender, positive bowel sounds MSK no focal spinal tenderness, no upper extremity lymphedema Neuro: nonfocal, well oriented, appropriate affect Breasts: The right breast is status post lumpectomy and radiation.  The areas where the patient has sensitivity and tenderness are unremarkable.  There is no evidence  of disease recurrence.  The left breast is benign.  Both axillae are benign.    LAB RESULTS:  CMP     Component Value Date/Time   NA 140 05/21/2018 1017   NA 139 03/01/2017 1021   K 4.4 05/21/2018 1017   K 4.5 03/01/2017 1021   CL 104 05/21/2018 1017   CO2 25 05/21/2018 1017   CO2 27 03/01/2017 1021   GLUCOSE 111 (H) 05/21/2018 1017   GLUCOSE 92 03/01/2017 1021   BUN 8 05/21/2018 1017   BUN 24.0 03/01/2017 1021   CREATININE 0.83 05/21/2018 1017   CREATININE 0.8 03/01/2017 1021   CALCIUM 9.3 05/21/2018 1017   CALCIUM 9.8 03/01/2017 1021   PROT 7.3 05/21/2018 1017   PROT 7.6 03/01/2017 1021   ALBUMIN 3.6 05/21/2018 1017   ALBUMIN 4.1 03/01/2017 1021   AST 27 05/21/2018 1017   AST 13 03/01/2017 1021   ALT 40 05/21/2018 1017   ALT 17 03/01/2017 1021   ALKPHOS 112 05/21/2018 1017   ALKPHOS 94 03/01/2017 1021   BILITOT 0.3 05/21/2018 1017   BILITOT 0.45 03/01/2017 1021   GFRNONAA >60 05/21/2018 1017   GFRAA >60 05/21/2018 1017    I No results found for: SPEP  Lab Results  Component Value Date   WBC 5.6 05/21/2018   NEUTROABS 3.3 05/21/2018   HGB 13.3  05/21/2018   HCT 41.9 05/21/2018   MCV 93.9 05/21/2018   PLT 314 05/21/2018      Chemistry      Component Value Date/Time   NA 140 05/21/2018 1017   NA 139 03/01/2017 1021   K 4.4 05/21/2018 1017   K 4.5 03/01/2017 1021   CL 104 05/21/2018 1017   CO2 25 05/21/2018 1017   CO2 27 03/01/2017 1021   BUN 8 05/21/2018 1017   BUN 24.0 03/01/2017 1021   CREATININE 0.83 05/21/2018 1017   CREATININE 0.8 03/01/2017 1021      Component Value Date/Time   CALCIUM 9.3 05/21/2018 1017   CALCIUM 9.8 03/01/2017 1021   ALKPHOS 112 05/21/2018 1017   ALKPHOS 94 03/01/2017 1021   AST 27 05/21/2018 1017   AST 13 03/01/2017 1021   ALT 40 05/21/2018 1017   ALT 17 03/01/2017 1021   BILITOT 0.3 05/21/2018 1017   BILITOT 0.45 03/01/2017 1021       No results found for: LABCA2  No components found for: LABCA125  No results for input(s): INR in the last 168 hours.  Urinalysis    Component Value Date/Time   LABSPEC 1.020 09/28/2015 1224   PHURINE 5.0 09/28/2015 1224   GLUCOSEU Negative 09/28/2015 1224   HGBUR Negative 09/28/2015 1224   BILIRUBINUR Negative 09/28/2015 1224   KETONESUR Negative 09/28/2015 1224   PROTEINUR Negative 09/28/2015 1224   UROBILINOGEN 0.2 09/28/2015 1224   NITRITE Negative 09/28/2015 1224   LEUKOCYTESUR Negative 09/28/2015 1224    STUDIES:  CT scan and MRI results discussed with the patient  ASSESSMENT: 53 y.o. [place] woman  (1) status post right breast upper outer quadrant biopsy 08/06/2012 for a clinical mT2 N0, stage IIA invasive ductal carcinoma, estrogen receptor 3+ positive, progesterone receptor and HER-2 negative, Ki67 3+ [[id number]]  (2) treated neoadjuvantly with dose dense cyclophosphamide and doxorubicin x4, completed 10/25/2012, followed by a single dose of weekly paclitaxel, poorly tolerated; followed by 11 doses of Abraxane completed 02/14/2013  (3) status post right lumpectomy and sentinel lymph node sampling 03/10/2013 for a  ypT2 pN0, stage IIA invasive ductal  carcinoma, grade 2, estrogen receptor 100% positive, progesterone receptor 21% positive, with an MIB-1 of 14%, and HER-2 amplified, the signals ratio being 2.52, the number per cell 2.90  (a) reclassified as stage IB in the 2018 prognostic classification  (4) adjuvant radiation completed March 2015 (?)  (5) started tamoxifen March 2015, stopped 05/25/14 because of superficial clot to left cephalic vein; began anastrozole on 05/29/14, discontinued after 2 weeks with rash; started letrozole 05/24/2015, discontinued after 5 days use because of angioedema-like symptoms  (6) started trastuzumab 05/23/2013, completed 05/15/14  (7) osteopenia: With T score of -1.7 on bone density [facility] 06/10/2014.   (a) with dental clearance started denosumab/Prolia 12/09/2015  (b) denosumab/Prolia discontinued after October 2017 dose at patient's request  (8) fulvestrant started 06/23/2015   PLAN: Elizabeth Miles is now 5 years out from definitive surgery for her breast cancer with no evidence of disease recurrence.  This is very favorable.  She is tolerating the fulvestrant generally well.  She does have some side effects related to it and she will be glad when we stop.  That will be a year from now.  We are going to check labs every other treatment and we are going to continue the fulvestrant every 4 weeks.  If she develops any symptoms suggestive of viral infection she will let us know  She will see me again in 6 months.  She will be due for mammography again February 2021 Date we discussed some of the issues relating to her divorce and the fact that she does need to change her healthcare power of attorney.  She was given the appropriate forms and it was explained to her how to complete and notarize them.  She knows to call for any other issues that may develop before the next visit.  , Virgie Dad, MD  05/21/18 11:19 AM Medical Oncology and Hematology [facility] [address] [place], [place] [address] Tel. [phone]    Fax. [phone]  I, Jacqualyn Posey am acting as a Education administrator for Chauncey Cruel, MD.   I, Lurline Del MD, have reviewed the above documentation for accuracy and completeness, and I agree with the above.

## 2018-05-21 ENCOUNTER — Other Ambulatory Visit: Payer: Self-pay

## 2018-05-21 ENCOUNTER — Telehealth: Payer: Self-pay | Admitting: Oncology

## 2018-05-21 ENCOUNTER — Inpatient Hospital Stay (HOSPITAL_BASED_OUTPATIENT_CLINIC_OR_DEPARTMENT_OTHER): Payer: BLUE CROSS/BLUE SHIELD | Admitting: Oncology

## 2018-05-21 ENCOUNTER — Inpatient Hospital Stay: Payer: BLUE CROSS/BLUE SHIELD | Attending: Oncology

## 2018-05-21 ENCOUNTER — Inpatient Hospital Stay: Payer: BLUE CROSS/BLUE SHIELD

## 2018-05-21 VITALS — BP 126/90 | HR 93 | Temp 98.7°F | Resp 17 | Ht 66.0 in | Wt 237.8 lb

## 2018-05-21 DIAGNOSIS — Z801 Family history of malignant neoplasm of trachea, bronchus and lung: Secondary | ICD-10-CM | POA: Insufficient documentation

## 2018-05-21 DIAGNOSIS — Z7982 Long term (current) use of aspirin: Secondary | ICD-10-CM

## 2018-05-21 DIAGNOSIS — K219 Gastro-esophageal reflux disease without esophagitis: Secondary | ICD-10-CM

## 2018-05-21 DIAGNOSIS — R51 Headache: Secondary | ICD-10-CM

## 2018-05-21 DIAGNOSIS — R0781 Pleurodynia: Secondary | ICD-10-CM

## 2018-05-21 DIAGNOSIS — Z79811 Long term (current) use of aromatase inhibitors: Secondary | ICD-10-CM | POA: Diagnosis not present

## 2018-05-21 DIAGNOSIS — Z923 Personal history of irradiation: Secondary | ICD-10-CM

## 2018-05-21 DIAGNOSIS — Z8052 Family history of malignant neoplasm of bladder: Secondary | ICD-10-CM | POA: Insufficient documentation

## 2018-05-21 DIAGNOSIS — C50411 Malignant neoplasm of upper-outer quadrant of right female breast: Secondary | ICD-10-CM

## 2018-05-21 DIAGNOSIS — Z9221 Personal history of antineoplastic chemotherapy: Secondary | ICD-10-CM | POA: Diagnosis not present

## 2018-05-21 DIAGNOSIS — Z7952 Long term (current) use of systemic steroids: Secondary | ICD-10-CM | POA: Insufficient documentation

## 2018-05-21 DIAGNOSIS — F430 Acute stress reaction: Secondary | ICD-10-CM

## 2018-05-21 DIAGNOSIS — F418 Other specified anxiety disorders: Secondary | ICD-10-CM | POA: Diagnosis not present

## 2018-05-21 DIAGNOSIS — Z808 Family history of malignant neoplasm of other organs or systems: Secondary | ICD-10-CM | POA: Insufficient documentation

## 2018-05-21 DIAGNOSIS — M8580 Other specified disorders of bone density and structure, unspecified site: Secondary | ICD-10-CM

## 2018-05-21 DIAGNOSIS — Z791 Long term (current) use of non-steroidal anti-inflammatories (NSAID): Secondary | ICD-10-CM

## 2018-05-21 DIAGNOSIS — Z170 Estrogen receptor positive status [ER+]: Secondary | ICD-10-CM | POA: Diagnosis not present

## 2018-05-21 DIAGNOSIS — Z79899 Other long term (current) drug therapy: Secondary | ICD-10-CM | POA: Diagnosis not present

## 2018-05-21 DIAGNOSIS — M818 Other osteoporosis without current pathological fracture: Secondary | ICD-10-CM

## 2018-05-21 LAB — CMP (CANCER CENTER ONLY)
ALBUMIN: 3.6 g/dL (ref 3.5–5.0)
ALK PHOS: 112 U/L (ref 38–126)
ALT: 40 U/L (ref 0–44)
AST: 27 U/L (ref 15–41)
Anion gap: 11 (ref 5–15)
BILIRUBIN TOTAL: 0.3 mg/dL (ref 0.3–1.2)
BUN: 8 mg/dL (ref 6–20)
CO2: 25 mmol/L (ref 22–32)
CREATININE: 0.83 mg/dL (ref 0.44–1.00)
Calcium: 9.3 mg/dL (ref 8.9–10.3)
Chloride: 104 mmol/L (ref 98–111)
GFR, Estimated: >60 mL/min (ref >60)
GLUCOSE: 111 mg/dL — AB (ref 70–99)
POTASSIUM: 4.4 mmol/L (ref 3.5–5.1)
SODIUM: 140 mmol/L (ref 135–145)
Total Protein: 7.3 g/dL (ref 6.5–8.1)

## 2018-05-21 LAB — CBC WITH DIFFERENTIAL (CANCER CENTER ONLY)
Abs Immature Granulocytes: 0.04 10*3/uL (ref 0.00–0.07)
Basophils Absolute: 0.1 10*3/uL (ref 0.0–0.1)
Basophils Relative: 1 %
EOS PCT: 5 %
Eosinophils Absolute: 0.3 10*3/uL (ref 0.0–0.5)
HEMATOCRIT: 41.9 % (ref 36.0–46.0)
Hemoglobin: 13.3 g/dL (ref 12.0–15.0)
Immature Granulocytes: 1 %
LYMPHS ABS: 1.5 10*3/uL (ref 0.7–4.0)
Lymphocytes Relative: 26 %
MCH: 29.8 pg (ref 26.0–34.0)
MCHC: 31.7 g/dL (ref 30.0–36.0)
MCV: 93.9 fL (ref 80.0–100.0)
MONO ABS: 0.5 10*3/uL (ref 0.1–1.0)
Monocytes Relative: 9 %
Neutro Abs: 3.3 10*3/uL (ref 1.7–7.7)
Neutrophils Relative %: 58 %
Platelet Count: 314 10*3/uL (ref 150–400)
RBC: 4.46 MIL/uL (ref 3.87–5.11)
RDW: 13.1 % (ref 11.5–15.5)
WBC: 5.6 10*3/uL (ref 4.0–10.5)
nRBC: 0 % (ref 0.0–0.2)

## 2018-05-21 MED ORDER — FULVESTRANT 250 MG/5ML IM SOLN
500.0000 mg | INTRAMUSCULAR | Status: DC
  Administered 2018-05-21: 500 mg via INTRAMUSCULAR

## 2018-05-21 MED ORDER — FULVESTRANT 250 MG/5ML IM SOLN
INTRAMUSCULAR | Status: AC
  Filled 2018-05-21: qty 10

## 2018-05-21 NOTE — Patient Instructions (Signed)

## 2018-05-21 NOTE — Telephone Encounter (Signed)
Gave avs and calendar ° °

## 2018-06-18 ENCOUNTER — Inpatient Hospital Stay: Payer: BLUE CROSS/BLUE SHIELD | Attending: Oncology

## 2018-06-18 ENCOUNTER — Other Ambulatory Visit: Payer: Self-pay

## 2018-06-18 VITALS — BP 130/88 | HR 82 | Temp 98.3°F | Resp 18

## 2018-06-18 DIAGNOSIS — Z9221 Personal history of antineoplastic chemotherapy: Secondary | ICD-10-CM | POA: Insufficient documentation

## 2018-06-18 DIAGNOSIS — Z923 Personal history of irradiation: Secondary | ICD-10-CM | POA: Diagnosis not present

## 2018-06-18 DIAGNOSIS — Z79811 Long term (current) use of aromatase inhibitors: Secondary | ICD-10-CM | POA: Diagnosis not present

## 2018-06-18 DIAGNOSIS — Z170 Estrogen receptor positive status [ER+]: Secondary | ICD-10-CM | POA: Diagnosis not present

## 2018-06-18 DIAGNOSIS — C50411 Malignant neoplasm of upper-outer quadrant of right female breast: Secondary | ICD-10-CM | POA: Diagnosis present

## 2018-06-18 DIAGNOSIS — M8580 Other specified disorders of bone density and structure, unspecified site: Secondary | ICD-10-CM | POA: Diagnosis not present

## 2018-06-18 MED ORDER — FULVESTRANT 250 MG/5ML IM SOLN
INTRAMUSCULAR | Status: AC
  Filled 2018-06-18: qty 10

## 2018-06-18 MED ORDER — FULVESTRANT 250 MG/5ML IM SOLN
INTRAMUSCULAR | Status: AC
  Filled 2018-06-18: qty 5

## 2018-06-18 MED ORDER — FULVESTRANT 250 MG/5ML IM SOLN
500.0000 mg | INTRAMUSCULAR | Status: DC
  Administered 2018-06-18: 500 mg via INTRAMUSCULAR

## 2018-07-16 ENCOUNTER — Inpatient Hospital Stay: Payer: BLUE CROSS/BLUE SHIELD | Attending: Oncology

## 2018-07-16 ENCOUNTER — Other Ambulatory Visit: Payer: Self-pay

## 2018-07-16 ENCOUNTER — Inpatient Hospital Stay: Payer: BLUE CROSS/BLUE SHIELD

## 2018-07-16 ENCOUNTER — Other Ambulatory Visit: Payer: Self-pay | Admitting: Adult Health

## 2018-07-16 VITALS — BP 117/76 | HR 88 | Temp 98.9°F | Resp 18

## 2018-07-16 DIAGNOSIS — Z170 Estrogen receptor positive status [ER+]: Secondary | ICD-10-CM | POA: Insufficient documentation

## 2018-07-16 DIAGNOSIS — Z5111 Encounter for antineoplastic chemotherapy: Secondary | ICD-10-CM | POA: Diagnosis present

## 2018-07-16 DIAGNOSIS — M8580 Other specified disorders of bone density and structure, unspecified site: Secondary | ICD-10-CM

## 2018-07-16 DIAGNOSIS — C50411 Malignant neoplasm of upper-outer quadrant of right female breast: Secondary | ICD-10-CM

## 2018-07-16 LAB — CMP (CANCER CENTER ONLY)
ALT: 32 U/L (ref 0–44)
AST: 22 U/L (ref 15–41)
Albumin: 3.8 g/dL (ref 3.5–5.0)
Alkaline Phosphatase: 109 U/L (ref 38–126)
Anion gap: 9 (ref 5–15)
BUN: 9 mg/dL (ref 6–20)
CO2: 27 mmol/L (ref 22–32)
Calcium: 9.5 mg/dL (ref 8.9–10.3)
Chloride: 105 mmol/L (ref 98–111)
Creatinine: 0.78 mg/dL (ref 0.44–1.00)
GFR, Est AFR Am: >60 mL/min (ref >60)
GFR, Estimated: >60 mL/min (ref >60)
Glucose, Bld: 94 mg/dL (ref 70–99)
Potassium: 4.2 mmol/L (ref 3.5–5.1)
Sodium: 141 mmol/L (ref 135–145)
Total Bilirubin: 0.5 mg/dL (ref 0.3–1.2)
Total Protein: 7.4 g/dL (ref 6.5–8.1)

## 2018-07-16 LAB — CBC WITH DIFFERENTIAL (CANCER CENTER ONLY)
Abs Immature Granulocytes: 0.04 10*3/uL (ref 0.00–0.07)
Basophils Absolute: 0.1 10*3/uL (ref 0.0–0.1)
Basophils Relative: 1 %
Eosinophils Absolute: 0.5 10*3/uL (ref 0.0–0.5)
Eosinophils Relative: 8 %
HCT: 43.1 % (ref 36.0–46.0)
Hemoglobin: 13.8 g/dL (ref 12.0–15.0)
Immature Granulocytes: 1 %
Lymphocytes Relative: 27 %
Lymphs Abs: 1.6 10*3/uL (ref 0.7–4.0)
MCH: 29.5 pg (ref 26.0–34.0)
MCHC: 32 g/dL (ref 30.0–36.0)
MCV: 92.1 fL (ref 80.0–100.0)
Monocytes Absolute: 0.5 10*3/uL (ref 0.1–1.0)
Monocytes Relative: 8 %
Neutro Abs: 3.4 10*3/uL (ref 1.7–7.7)
Neutrophils Relative %: 55 %
Platelet Count: 318 10*3/uL (ref 150–400)
RBC: 4.68 MIL/uL (ref 3.87–5.11)
RDW: 13 % (ref 11.5–15.5)
WBC Count: 6.1 10*3/uL (ref 4.0–10.5)
nRBC: 0 % (ref 0.0–0.2)

## 2018-07-16 MED ORDER — FULVESTRANT 250 MG/5ML IM SOLN
500.0000 mg | Freq: Once | INTRAMUSCULAR | Status: AC
  Administered 2018-07-16: 11:00:00 500 mg via INTRAMUSCULAR

## 2018-07-16 MED ORDER — FULVESTRANT 250 MG/5ML IM SOLN
INTRAMUSCULAR | Status: AC
  Filled 2018-07-16: qty 5

## 2018-07-16 NOTE — Patient Instructions (Signed)

## 2018-08-13 ENCOUNTER — Other Ambulatory Visit: Payer: Self-pay

## 2018-08-13 ENCOUNTER — Inpatient Hospital Stay: Payer: BC Managed Care – PPO | Attending: Oncology

## 2018-08-13 VITALS — BP 126/78 | HR 77 | Temp 98.9°F | Resp 17

## 2018-08-13 DIAGNOSIS — Z923 Personal history of irradiation: Secondary | ICD-10-CM | POA: Insufficient documentation

## 2018-08-13 DIAGNOSIS — Z170 Estrogen receptor positive status [ER+]: Secondary | ICD-10-CM | POA: Diagnosis not present

## 2018-08-13 DIAGNOSIS — C50411 Malignant neoplasm of upper-outer quadrant of right female breast: Secondary | ICD-10-CM | POA: Insufficient documentation

## 2018-08-13 DIAGNOSIS — Z9221 Personal history of antineoplastic chemotherapy: Secondary | ICD-10-CM | POA: Insufficient documentation

## 2018-08-13 DIAGNOSIS — M8580 Other specified disorders of bone density and structure, unspecified site: Secondary | ICD-10-CM | POA: Insufficient documentation

## 2018-08-13 DIAGNOSIS — Z79811 Long term (current) use of aromatase inhibitors: Secondary | ICD-10-CM | POA: Insufficient documentation

## 2018-08-13 MED ORDER — FULVESTRANT 250 MG/5ML IM SOLN
INTRAMUSCULAR | Status: AC
  Filled 2018-08-13: qty 10

## 2018-08-13 MED ORDER — FULVESTRANT 250 MG/5ML IM SOLN
500.0000 mg | INTRAMUSCULAR | Status: DC
  Administered 2018-08-13: 500 mg via INTRAMUSCULAR

## 2018-08-13 NOTE — Patient Instructions (Signed)

## 2018-08-15 ENCOUNTER — Other Ambulatory Visit: Payer: Self-pay

## 2018-08-15 ENCOUNTER — Ambulatory Visit (AMBULATORY_SURGERY_CENTER): Payer: Self-pay

## 2018-08-15 VITALS — Ht 66.0 in | Wt 235.0 lb

## 2018-08-15 DIAGNOSIS — Z1211 Encounter for screening for malignant neoplasm of colon: Secondary | ICD-10-CM

## 2018-08-15 MED ORDER — PEG 3350-KCL-NA BICARB-NACL 420 G PO SOLR: 4000.0000 mL | Freq: Once | ORAL | 0 refills | Status: AC

## 2018-08-15 NOTE — Progress Notes (Signed)
Denies allergies to eggs or soy products. Denies complication of anesthesia or sedation. Denies use of weight loss medication. Denies use of O2.   Emmi instructions given for colonoscopy.  Pre-Visit was conducted by phone due to Covid 19. Instructions were reviewed and mailed to patients confirmed home address. Patient was encouraged to call if she had any questions or concerns regarding instructions.  

## 2018-08-26 ENCOUNTER — Ambulatory Visit: Payer: BLUE CROSS/BLUE SHIELD | Admitting: Neurology

## 2018-08-28 ENCOUNTER — Telehealth: Payer: Self-pay | Admitting: Gastroenterology

## 2018-08-28 NOTE — Telephone Encounter (Signed)
Spoke with patient regarding Covid-19 screening questions Covid-19 Screening Questions:  Do you now or have you had a fever in the last 14 days? n  Do you have any respiratory symptoms of shortness of breath or cough now or in the last 14 days? no  Do you have any family members or close contacts with diagnosed or suspected Covid-19 in the past 14 days? no  Have you been tested for Covid-19 and found to be positive? no  Pt made aware of that care partner may wait in the car or come up to the lobby during the procedure but will need to provide their own mask.

## 2018-08-29 ENCOUNTER — Ambulatory Visit (AMBULATORY_SURGERY_CENTER): Payer: BC Managed Care – PPO | Admitting: Gastroenterology

## 2018-08-29 ENCOUNTER — Other Ambulatory Visit: Payer: Self-pay

## 2018-08-29 ENCOUNTER — Encounter: Payer: Self-pay | Admitting: Gastroenterology

## 2018-08-29 VITALS — BP 132/70 | HR 94 | Temp 97.7°F | Resp 12 | Ht 66.0 in | Wt 235.0 lb

## 2018-08-29 DIAGNOSIS — D127 Benign neoplasm of rectosigmoid junction: Secondary | ICD-10-CM

## 2018-08-29 DIAGNOSIS — Z1211 Encounter for screening for malignant neoplasm of colon: Secondary | ICD-10-CM | POA: Diagnosis present

## 2018-08-29 DIAGNOSIS — D120 Benign neoplasm of cecum: Secondary | ICD-10-CM | POA: Diagnosis not present

## 2018-08-29 DIAGNOSIS — D123 Benign neoplasm of transverse colon: Secondary | ICD-10-CM | POA: Diagnosis not present

## 2018-08-29 DIAGNOSIS — D124 Benign neoplasm of descending colon: Secondary | ICD-10-CM

## 2018-08-29 MED ORDER — SODIUM CHLORIDE 0.9 % IV SOLN: 500.0000 mL | Freq: Once | INTRAVENOUS | Status: DC

## 2018-08-29 NOTE — Op Note (Signed)
[facility] Patient Name: Elizabeth Miles Procedure Date: 08/29/2018 9:15 AM MRN: [id number] Endoscopist: Justice Britain , MD Age: 53 Referring MD:  Date of Birth: 1965-03-27 Gender: Female Account #: [id number] Procedure:                Colonoscopy Indications:              Screening for malignant neoplasm in the colon, This                            is the patient's first colonoscopy Medicines:                Monitored Anesthesia Care Procedure:                Pre-Anesthesia Assessment:                           - Prior to the procedure, a History and Physical                            was performed, and patient medications and                            allergies were reviewed. The patient's tolerance of                            previous anesthesia was also reviewed. The risks                            and benefits of the procedure and the sedation                            options and risks were discussed with the patient.                            All questions were answered, and informed consent                            was obtained. Prior Anticoagulants: The patient has                            taken no previous anticoagulant or antiplatelet                            agents. ASA Grade Assessment: II - A patient with                            mild systemic disease. After reviewing the risks                            and benefits, the patient was deemed in                            satisfactory condition to undergo the procedure.  After obtaining informed consent, the colonoscope                            was passed under direct vision. Throughout the                            procedure, the patient's blood pressure, pulse, and                            oxygen saturations were monitored continuously. The                            Colonoscope was introduced through the anus and                            advanced to the 5 cm  into the ileum. The                            colonoscopy was somewhat difficult due to                            restricted mobility of the colon. Successful                            completion of the procedure was aided by changing                            the patient's position, using manual pressure,                            straightening and shortening the scope to obtain                            bowel loop reduction and using scope torsion. The                            patient tolerated the procedure. The quality of the                            bowel preparation was good. The terminal ileum,                            ileocecal valve, appendiceal orifice, and rectum                            were photographed. Scope In: 9:28:16 AM Scope Out: 9:53:10 AM Scope Withdrawal Time: 0 hours 18 minutes 22 seconds  Total Procedure Duration: 0 hours 24 minutes 54 seconds  Findings:                 The digital rectal exam findings include                            hemorrhoids. Pertinent negatives include no  palpable rectal lesions.                           The terminal ileum and ileocecal valve appeared                            normal.                           Three sessile polyps were found in the                            recto-sigmoid colon (1), transverse colon (1) and                            cecum (1). The polyps were 3 to 5 mm in size. These                            polyps were removed with a cold snare. Resection                            and retrieval were complete.                           Normal mucosa was found in the entire colon                            otherwise.                           Non-bleeding non-thrombosed externa and internal                            hemorrhoids were found during retroflexion, during                            perianal exam and during digital exam. The                            hemorrhoids  were Grade II (internal hemorrhoids                            that prolapse but reduce spontaneously). Complications:            No immediate complications. Estimated Blood Loss:     Estimated blood loss was minimal. Impression:               - Hemorrhoids found on digital rectal exam.                           - The examined portion of the ileum was normal.                           - Three 3 to 5 mm polyps at the recto-sigmoid  colon, in the transverse colon and in the cecum,                            removed with a cold snare. Resected and retrieved.                           - Normal mucosa in the entire examined colon                            otherwise.                           - Non-bleeding non-thrombosed internal hemorrhoids. Recommendation:           - The patient will be observed post-procedure,                            until all discharge criteria are met.                           - Discharge patient to home.                           - Patient has a contact number available for                            emergencies. The signs and symptoms of potential                            delayed complications were discussed with the                            patient. Return to normal activities tomorrow.                            Written discharge instructions were provided to the                            patient.                           - High fiber diet.                           - Continue Miralax 1-2 times daily to try and have                            1 bowel movement daily.                           - Continue present medications.                           - Await pathology results.                           - Repeat  colonoscopy 07/03/08 for surveillance based                            on pathology results and adenomatous tissue.                           - The findings and recommendations were discussed                            with  the patient. Justice Britain, MD 08/29/2018 10:01:09 AM

## 2018-08-29 NOTE — Patient Instructions (Signed)
3 polyps removed today. Small ones. Wait for pathology letter from Dr Rush Landmark in about 2 weeks.  It will also explain when to have your next colonoscopy.  We will also send you a recall letter. Please note the symptoms to report immediately and the 24/7 phone number listed below. Continue Miralax at least 1-2 times daily. Internal hemorrhoids also noted.  Again constipation can make worse so avoid constipation. Dr Rush Landmark wants you to eat a high fiber diet.  Please see handout.   YOU HAD AN ENDOSCOPIC PROCEDURE TODAY AT [facility]:   Refer to the procedure report that was given to you for any specific questions about what was found during the examination.  If the procedure report does not answer your questions, please call your gastroenterologist to clarify.  If you requested that your care partner not be given the details of your procedure findings, then the procedure report has been included in a sealed envelope for you to review at your convenience later.  YOU SHOULD EXPECT: Some feelings of bloating in the abdomen. Passage of more gas than usual.  Walking can help get rid of the air that was put into your GI tract during the procedure and reduce the bloating. If you had a lower endoscopy (such as a colonoscopy or flexible sigmoidoscopy) you may notice spotting of blood in your stool or on the toilet paper. If you underwent a bowel prep for your procedure, you may not have a normal bowel movement for a few days.  Please Note:  You might notice some irritation and congestion in your nose or some drainage.  This is from the oxygen used during your procedure.  There is no need for concern and it should clear up in a day or so.  SYMPTOMS TO REPORT IMMEDIATELY:   Following lower endoscopy (colonoscopy or flexible sigmoidoscopy):  Excessive amounts of blood in the stool  Significant tenderness or worsening of abdominal pains  Swelling of the abdomen that is new,  acute  Fever of 100F or higher  For urgent or emergent issues, a gastroenterologist can be reached at any hour by calling [phone].   DIET:  We do recommend a small meal at first, but then you may proceed to your regular diet.  Drink plenty of fluids but you should avoid alcoholic beverages for 24 hours.  ACTIVITY:  You should plan to take it easy for the rest of today and you should NOT DRIVE or use heavy machinery until tomorrow (because of the sedation medicines used during the test).    FOLLOW UP: Our staff will call the number listed on your records 48-72 hours following your procedure to check on you and address any questions or concerns that you may have regarding the information given to you following your procedure. If we do not reach you, we will leave a message.  We will attempt to reach you two times.  During this call, we will ask if you have developed any symptoms of COVID 19. If you develop any symptoms (ie: fever, flu-like symptoms, shortness of breath, cough etc.) before then, please call [phone].  If you test positive for Covid 19 in the 2 weeks post procedure, please call and report this information to [place].    If any biopsies were taken you will be contacted by phone or by letter within the next 1-3 weeks.  Please call us at [phone] if you have not heard about the biopsies in 3 weeks.  SIGNATURES/CONFIDENTIALITY: You and/or your care partner have signed paperwork which will be entered into your electronic medical record.  These signatures attest to the fact that that the information above on your After Visit Summary has been reviewed and is understood.  Full responsibility of the confidentiality of this discharge information lies with you and/or your care-partner.

## 2018-08-29 NOTE — Progress Notes (Signed)
To PACU, VSS. Report to [email or web address] 

## 2018-08-29 NOTE — Progress Notes (Signed)
Pt's states no medical or surgical changes since previsit or office visit.  TEMP-MO  VITAL SIGNS-JB

## 2018-08-29 NOTE — Progress Notes (Signed)
Called to room to assist during endoscopic procedure.  Patient ID and intended procedure confirmed with present staff. Received instructions for my participation in the procedure from the performing physician.  

## 2018-09-02 ENCOUNTER — Telehealth: Payer: Self-pay | Admitting: *Deleted

## 2018-09-02 NOTE — Telephone Encounter (Signed)
  Follow up Call-  Call back number 08/29/2018  Post procedure Call Back phone  # [phone]  Permission to leave phone message Yes  Some recent data might be hidden     Patient questions:  Do you have a fever, pain , or abdominal swelling? No. Pain Score  0 *  Have you tolerated food without any problems? Yes.    Have you been able to return to your normal activities? Yes.    Do you have any questions about your discharge instructions: Diet   No. Medications  No. Follow up visit  No.  Do you have questions or concerns about your Care? No.  Actions: * If pain score is 4 or above: No action needed, pain <4.  1. Have you developed a fever since your procedure? no  2.   Have you had an respiratory symptoms (SOB or cough) since your procedure? no  3.   Have you tested positive for COVID 19 since your procedure no  4.   Have you had any family members/close contacts diagnosed with the COVID 19 since your procedure?  no   If yes to any of these questions please route to Joylene John, RN and Alphonsa Gin, Therapist, sports.

## 2018-09-08 ENCOUNTER — Encounter: Payer: Self-pay | Admitting: Gastroenterology

## 2018-09-08 ENCOUNTER — Other Ambulatory Visit: Payer: Self-pay | Admitting: Oncology

## 2018-09-10 ENCOUNTER — Other Ambulatory Visit: Payer: Self-pay

## 2018-09-10 ENCOUNTER — Inpatient Hospital Stay: Payer: BC Managed Care – PPO

## 2018-09-10 ENCOUNTER — Inpatient Hospital Stay: Payer: BC Managed Care – PPO | Attending: Oncology

## 2018-09-10 VITALS — BP 118/76 | HR 81 | Temp 98.7°F | Resp 16

## 2018-09-10 DIAGNOSIS — Z5111 Encounter for antineoplastic chemotherapy: Secondary | ICD-10-CM | POA: Insufficient documentation

## 2018-09-10 DIAGNOSIS — M8580 Other specified disorders of bone density and structure, unspecified site: Secondary | ICD-10-CM | POA: Insufficient documentation

## 2018-09-10 DIAGNOSIS — C50411 Malignant neoplasm of upper-outer quadrant of right female breast: Secondary | ICD-10-CM | POA: Diagnosis not present

## 2018-09-10 DIAGNOSIS — Z170 Estrogen receptor positive status [ER+]: Secondary | ICD-10-CM

## 2018-09-10 LAB — CMP (CANCER CENTER ONLY)
ALT: 32 U/L (ref 0–44)
AST: 26 U/L (ref 15–41)
Albumin: 4.2 g/dL (ref 3.5–5.0)
Alkaline Phosphatase: 96 U/L (ref 38–126)
Anion gap: 11 (ref 5–15)
BUN: 12 mg/dL (ref 6–20)
CO2: 26 mmol/L (ref 22–32)
Calcium: 9.5 mg/dL (ref 8.9–10.3)
Chloride: 103 mmol/L (ref 98–111)
Creatinine: 0.75 mg/dL (ref 0.44–1.00)
GFR, Est AFR Am: >60 mL/min (ref >60)
GFR, Estimated: >60 mL/min (ref >60)
Glucose, Bld: 92 mg/dL (ref 70–99)
Potassium: 4.3 mmol/L (ref 3.5–5.1)
Sodium: 140 mmol/L (ref 135–145)
Total Bilirubin: 0.4 mg/dL (ref 0.3–1.2)
Total Protein: 7.5 g/dL (ref 6.5–8.1)

## 2018-09-10 LAB — CBC WITH DIFFERENTIAL (CANCER CENTER ONLY)
Abs Immature Granulocytes: 0.01 10*3/uL (ref 0.00–0.07)
Basophils Absolute: 0 10*3/uL (ref 0.0–0.1)
Basophils Relative: 1 %
Eosinophils Absolute: 0.4 10*3/uL (ref 0.0–0.5)
Eosinophils Relative: 8 %
HCT: 43.8 % (ref 36.0–46.0)
Hemoglobin: 14.3 g/dL (ref 12.0–15.0)
Immature Granulocytes: 0 %
Lymphocytes Relative: 30 %
Lymphs Abs: 1.5 10*3/uL (ref 0.7–4.0)
MCH: 29.7 pg (ref 26.0–34.0)
MCHC: 32.6 g/dL (ref 30.0–36.0)
MCV: 90.9 fL (ref 80.0–100.0)
Monocytes Absolute: 0.5 10*3/uL (ref 0.1–1.0)
Monocytes Relative: 9 %
Neutro Abs: 2.6 10*3/uL (ref 1.7–7.7)
Neutrophils Relative %: 52 %
Platelet Count: 306 10*3/uL (ref 150–400)
RBC: 4.82 MIL/uL (ref 3.87–5.11)
RDW: 12.8 % (ref 11.5–15.5)
WBC Count: 5 10*3/uL (ref 4.0–10.5)
nRBC: 0 % (ref 0.0–0.2)

## 2018-09-10 MED ORDER — FULVESTRANT 250 MG/5ML IM SOLN
INTRAMUSCULAR | Status: AC
  Filled 2018-09-10: qty 5

## 2018-09-10 MED ORDER — FULVESTRANT 250 MG/5ML IM SOLN
500.0000 mg | Freq: Once | INTRAMUSCULAR | Status: AC
  Administered 2018-09-10: 500 mg via INTRAMUSCULAR

## 2018-09-10 NOTE — Patient Instructions (Signed)
Fulvestrant injection What is this medicine? FULVESTRANT (ful VES trant) blocks the effects of estrogen. It is used to treat breast cancer. This medicine may be used for other purposes; ask your health care provider or pharmacist if you have questions. COMMON BRAND NAME(S): FASLODEX What should I tell my health care provider before I take this medicine? They need to know if you have any of these conditions:  bleeding disorders  liver disease  low blood counts, like low white cell, platelet, or red cell counts  an unusual or allergic reaction to fulvestrant, other medicines, foods, dyes, or preservatives  pregnant or trying to get pregnant  breast-feeding How should I use this medicine? This medicine is for injection into a muscle. It is usually given by a health care professional in a hospital or clinic setting. Talk to your pediatrician regarding the use of this medicine in children. Special care may be needed. Overdosage: If you think you have taken too much of this medicine contact a poison control center or emergency room at once. NOTE: This medicine is only for you. Do not share this medicine with others. What if I miss a dose? It is important not to miss your dose. Call your doctor or health care professional if you are unable to keep an appointment. What may interact with this medicine?  medicines that treat or prevent blood clots like warfarin, enoxaparin, dalteparin, apixaban, dabigatran, and rivaroxaban This list may not describe all possible interactions. Give your health care provider a list of all the medicines, herbs, non-prescription drugs, or dietary supplements you use. Also tell them if you smoke, drink alcohol, or use illegal drugs. Some items may interact with your medicine. What should I watch for while using this medicine? Your condition will be monitored carefully while you are receiving this medicine. You will need important blood work done while you are taking  this medicine. Do not become pregnant while taking this medicine or for at least 1 year after stopping it. Women of child-bearing potential will need to have a negative pregnancy test before starting this medicine. Women should inform their doctor if they wish to become pregnant or think they might be pregnant. There is a potential for serious side effects to an unborn child. Men should inform their doctors if they wish to father a child. This medicine may lower sperm counts. Talk to your health care professional or pharmacist for more information. Do not breast-feed an infant while taking this medicine or for 1 year after the last dose. What side effects may I notice from receiving this medicine? Side effects that you should report to your doctor or health care professional as soon as possible:  allergic reactions like skin rash, itching or hives, swelling of the face, lips, or tongue  feeling faint or lightheaded, falls  pain, tingling, numbness, or weakness in the legs  signs and symptoms of infection like fever or chills; cough; flu-like symptoms; sore throat  vaginal bleeding Side effects that usually do not require medical attention (report to your doctor or health care professional if they continue or are bothersome):  aches, pains  constipation  diarrhea  headache  hot flashes  nausea, vomiting  pain at site where injected  stomach pain This list may not describe all possible side effects. Call your doctor for medical advice about side effects. You may report side effects to FDA at 1-800-FDA-1088. Where should I keep my medicine? This drug is given in a hospital or clinic and will   not be stored at home. NOTE: This sheet is a summary. It may not cover all possible information. If you have questions about this medicine, talk to your doctor, pharmacist, or health care provider.  2020 [employer] Standard (2017-05-24 11:34:41)  

## 2018-10-08 ENCOUNTER — Other Ambulatory Visit: Payer: Self-pay

## 2018-10-08 ENCOUNTER — Inpatient Hospital Stay: Payer: BC Managed Care – PPO | Attending: Oncology

## 2018-10-08 VITALS — BP 126/81 | HR 88 | Temp 98.3°F | Resp 18

## 2018-10-08 DIAGNOSIS — C50411 Malignant neoplasm of upper-outer quadrant of right female breast: Secondary | ICD-10-CM | POA: Insufficient documentation

## 2018-10-08 DIAGNOSIS — Z5111 Encounter for antineoplastic chemotherapy: Secondary | ICD-10-CM | POA: Insufficient documentation

## 2018-10-08 DIAGNOSIS — M8580 Other specified disorders of bone density and structure, unspecified site: Secondary | ICD-10-CM

## 2018-10-08 DIAGNOSIS — Z170 Estrogen receptor positive status [ER+]: Secondary | ICD-10-CM

## 2018-10-08 MED ORDER — FULVESTRANT 250 MG/5ML IM SOLN
INTRAMUSCULAR | Status: AC
  Filled 2018-10-08: qty 5

## 2018-10-08 MED ORDER — FULVESTRANT 250 MG/5ML IM SOLN
500.0000 mg | INTRAMUSCULAR | Status: DC
  Administered 2018-10-08: 11:00:00 500 mg via INTRAMUSCULAR

## 2018-10-08 NOTE — Patient Instructions (Signed)
Fulvestrant injection What is this medicine? FULVESTRANT (ful VES trant) blocks the effects of estrogen. It is used to treat breast cancer. This medicine may be used for other purposes; ask your health care provider or pharmacist if you have questions. COMMON BRAND NAME(S): FASLODEX What should I tell my health care provider before I take this medicine? They need to know if you have any of these conditions:  bleeding disorders  liver disease  low blood counts, like low white cell, platelet, or red cell counts  an unusual or allergic reaction to fulvestrant, other medicines, foods, dyes, or preservatives  pregnant or trying to get pregnant  breast-feeding How should I use this medicine? This medicine is for injection into a muscle. It is usually given by a health care professional in a hospital or clinic setting. Talk to your pediatrician regarding the use of this medicine in children. Special care may be needed. Overdosage: If you think you have taken too much of this medicine contact a poison control center or emergency room at once. NOTE: This medicine is only for you. Do not share this medicine with others. What if I miss a dose? It is important not to miss your dose. Call your doctor or health care professional if you are unable to keep an appointment. What may interact with this medicine?  medicines that treat or prevent blood clots like warfarin, enoxaparin, dalteparin, apixaban, dabigatran, and rivaroxaban This list may not describe all possible interactions. Give your health care provider a list of all the medicines, herbs, non-prescription drugs, or dietary supplements you use. Also tell them if you smoke, drink alcohol, or use illegal drugs. Some items may interact with your medicine. What should I watch for while using this medicine? Your condition will be monitored carefully while you are receiving this medicine. You will need important blood work done while you are taking  this medicine. Do not become pregnant while taking this medicine or for at least 1 year after stopping it. Women of child-bearing potential will need to have a negative pregnancy test before starting this medicine. Women should inform their doctor if they wish to become pregnant or think they might be pregnant. There is a potential for serious side effects to an unborn child. Men should inform their doctors if they wish to father a child. This medicine may lower sperm counts. Talk to your health care professional or pharmacist for more information. Do not breast-feed an infant while taking this medicine or for 1 year after the last dose. What side effects may I notice from receiving this medicine? Side effects that you should report to your doctor or health care professional as soon as possible:  allergic reactions like skin rash, itching or hives, swelling of the face, lips, or tongue  feeling faint or lightheaded, falls  pain, tingling, numbness, or weakness in the legs  signs and symptoms of infection like fever or chills; cough; flu-like symptoms; sore throat  vaginal bleeding Side effects that usually do not require medical attention (report to your doctor or health care professional if they continue or are bothersome):  aches, pains  constipation  diarrhea  headache  hot flashes  nausea, vomiting  pain at site where injected  stomach pain This list may not describe all possible side effects. Call your doctor for medical advice about side effects. You may report side effects to FDA at 1-800-FDA-1088. Where should I keep my medicine? This drug is given in a hospital or clinic and will   not be stored at home. NOTE: This sheet is a summary. It may not cover all possible information. If you have questions about this medicine, talk to your doctor, pharmacist, or health care provider.  2020 [employer] Standard (2017-05-24 11:34:41)  

## 2018-10-12 ENCOUNTER — Other Ambulatory Visit: Payer: Self-pay | Admitting: Oncology

## 2018-10-12 DIAGNOSIS — Z170 Estrogen receptor positive status [ER+]: Secondary | ICD-10-CM

## 2018-10-12 DIAGNOSIS — C50411 Malignant neoplasm of upper-outer quadrant of right female breast: Secondary | ICD-10-CM

## 2018-11-05 ENCOUNTER — Inpatient Hospital Stay: Payer: BC Managed Care – PPO | Attending: Oncology

## 2018-11-05 ENCOUNTER — Other Ambulatory Visit: Payer: Self-pay

## 2018-11-05 VITALS — BP 137/93 | HR 90 | Temp 98.2°F | Resp 18

## 2018-11-05 DIAGNOSIS — Z801 Family history of malignant neoplasm of trachea, bronchus and lung: Secondary | ICD-10-CM | POA: Diagnosis not present

## 2018-11-05 DIAGNOSIS — M8580 Other specified disorders of bone density and structure, unspecified site: Secondary | ICD-10-CM | POA: Diagnosis not present

## 2018-11-05 DIAGNOSIS — Z170 Estrogen receptor positive status [ER+]: Secondary | ICD-10-CM | POA: Insufficient documentation

## 2018-11-05 DIAGNOSIS — Z5111 Encounter for antineoplastic chemotherapy: Secondary | ICD-10-CM | POA: Diagnosis present

## 2018-11-05 DIAGNOSIS — Z9221 Personal history of antineoplastic chemotherapy: Secondary | ICD-10-CM | POA: Insufficient documentation

## 2018-11-05 DIAGNOSIS — Z7982 Long term (current) use of aspirin: Secondary | ICD-10-CM | POA: Insufficient documentation

## 2018-11-05 DIAGNOSIS — Z808 Family history of malignant neoplasm of other organs or systems: Secondary | ICD-10-CM | POA: Diagnosis not present

## 2018-11-05 DIAGNOSIS — C50411 Malignant neoplasm of upper-outer quadrant of right female breast: Secondary | ICD-10-CM | POA: Diagnosis not present

## 2018-11-05 DIAGNOSIS — Z791 Long term (current) use of non-steroidal anti-inflammatories (NSAID): Secondary | ICD-10-CM | POA: Diagnosis not present

## 2018-11-05 DIAGNOSIS — Z79899 Other long term (current) drug therapy: Secondary | ICD-10-CM | POA: Diagnosis not present

## 2018-11-05 DIAGNOSIS — Z923 Personal history of irradiation: Secondary | ICD-10-CM | POA: Insufficient documentation

## 2018-11-05 DIAGNOSIS — Z8052 Family history of malignant neoplasm of bladder: Secondary | ICD-10-CM | POA: Insufficient documentation

## 2018-11-05 MED ORDER — FULVESTRANT 250 MG/5ML IM SOLN
500.0000 mg | INTRAMUSCULAR | Status: DC
  Administered 2018-11-05: 11:00:00 500 mg via INTRAMUSCULAR

## 2018-11-05 MED ORDER — FULVESTRANT 250 MG/5ML IM SOLN
INTRAMUSCULAR | Status: AC
  Filled 2018-11-05: qty 5

## 2018-11-05 NOTE — Patient Instructions (Signed)
Fulvestrant injection What is this medicine? FULVESTRANT (ful VES trant) blocks the effects of estrogen. It is used to treat breast cancer. This medicine may be used for other purposes; ask your health care provider or pharmacist if you have questions. COMMON BRAND NAME(S): FASLODEX What should I tell my health care provider before I take this medicine? They need to know if you have any of these conditions:  bleeding disorders  liver disease  low blood counts, like low white cell, platelet, or red cell counts  an unusual or allergic reaction to fulvestrant, other medicines, foods, dyes, or preservatives  pregnant or trying to get pregnant  breast-feeding How should I use this medicine? This medicine is for injection into a muscle. It is usually given by a health care professional in a hospital or clinic setting. Talk to your pediatrician regarding the use of this medicine in children. Special care may be needed. Overdosage: If you think you have taken too much of this medicine contact a poison control center or emergency room at once. NOTE: This medicine is only for you. Do not share this medicine with others. What if I miss a dose? It is important not to miss your dose. Call your doctor or health care professional if you are unable to keep an appointment. What may interact with this medicine?  medicines that treat or prevent blood clots like warfarin, enoxaparin, dalteparin, apixaban, dabigatran, and rivaroxaban This list may not describe all possible interactions. Give your health care provider a list of all the medicines, herbs, non-prescription drugs, or dietary supplements you use. Also tell them if you smoke, drink alcohol, or use illegal drugs. Some items may interact with your medicine. What should I watch for while using this medicine? Your condition will be monitored carefully while you are receiving this medicine. You will need important blood work done while you are taking  this medicine. Do not become pregnant while taking this medicine or for at least 1 year after stopping it. Women of child-bearing potential will need to have a negative pregnancy test before starting this medicine. Women should inform their doctor if they wish to become pregnant or think they might be pregnant. There is a potential for serious side effects to an unborn child. Men should inform their doctors if they wish to father a child. This medicine may lower sperm counts. Talk to your health care professional or pharmacist for more information. Do not breast-feed an infant while taking this medicine or for 1 year after the last dose. What side effects may I notice from receiving this medicine? Side effects that you should report to your doctor or health care professional as soon as possible:  allergic reactions like skin rash, itching or hives, swelling of the face, lips, or tongue  feeling faint or lightheaded, falls  pain, tingling, numbness, or weakness in the legs  signs and symptoms of infection like fever or chills; cough; flu-like symptoms; sore throat  vaginal bleeding Side effects that usually do not require medical attention (report to your doctor or health care professional if they continue or are bothersome):  aches, pains  constipation  diarrhea  headache  hot flashes  nausea, vomiting  pain at site where injected  stomach pain This list may not describe all possible side effects. Call your doctor for medical advice about side effects. You may report side effects to FDA at 1-800-FDA-1088. Where should I keep my medicine? This drug is given in a hospital or clinic and will   not be stored at home. NOTE: This sheet is a summary. It may not cover all possible information. If you have questions about this medicine, talk to your doctor, pharmacist, or health care provider.  2020 [employer] Standard (2017-05-24 11:34:41)  

## 2018-12-03 ENCOUNTER — Inpatient Hospital Stay (HOSPITAL_BASED_OUTPATIENT_CLINIC_OR_DEPARTMENT_OTHER): Payer: BC Managed Care – PPO | Admitting: Oncology

## 2018-12-03 ENCOUNTER — Other Ambulatory Visit: Payer: Self-pay

## 2018-12-03 ENCOUNTER — Inpatient Hospital Stay: Payer: BC Managed Care – PPO

## 2018-12-03 ENCOUNTER — Inpatient Hospital Stay: Payer: BC Managed Care – PPO | Attending: Oncology

## 2018-12-03 VITALS — BP 122/83 | HR 90 | Temp 98.0°F | Resp 20 | Ht 66.0 in | Wt 229.3 lb

## 2018-12-03 DIAGNOSIS — Z9221 Personal history of antineoplastic chemotherapy: Secondary | ICD-10-CM | POA: Insufficient documentation

## 2018-12-03 DIAGNOSIS — Z791 Long term (current) use of non-steroidal anti-inflammatories (NSAID): Secondary | ICD-10-CM | POA: Diagnosis not present

## 2018-12-03 DIAGNOSIS — Z79899 Other long term (current) drug therapy: Secondary | ICD-10-CM | POA: Insufficient documentation

## 2018-12-03 DIAGNOSIS — Z170 Estrogen receptor positive status [ER+]: Secondary | ICD-10-CM | POA: Insufficient documentation

## 2018-12-03 DIAGNOSIS — Z79810 Long term (current) use of selective estrogen receptor modulators (SERMs): Secondary | ICD-10-CM | POA: Insufficient documentation

## 2018-12-03 DIAGNOSIS — M8580 Other specified disorders of bone density and structure, unspecified site: Secondary | ICD-10-CM

## 2018-12-03 DIAGNOSIS — Z8042 Family history of malignant neoplasm of prostate: Secondary | ICD-10-CM | POA: Diagnosis not present

## 2018-12-03 DIAGNOSIS — Z7982 Long term (current) use of aspirin: Secondary | ICD-10-CM | POA: Diagnosis not present

## 2018-12-03 DIAGNOSIS — F419 Anxiety disorder, unspecified: Secondary | ICD-10-CM | POA: Insufficient documentation

## 2018-12-03 DIAGNOSIS — F329 Major depressive disorder, single episode, unspecified: Secondary | ICD-10-CM | POA: Insufficient documentation

## 2018-12-03 DIAGNOSIS — Z8551 Personal history of malignant neoplasm of bladder: Secondary | ICD-10-CM | POA: Diagnosis not present

## 2018-12-03 DIAGNOSIS — Z923 Personal history of irradiation: Secondary | ICD-10-CM | POA: Insufficient documentation

## 2018-12-03 DIAGNOSIS — Z801 Family history of malignant neoplasm of trachea, bronchus and lung: Secondary | ICD-10-CM | POA: Insufficient documentation

## 2018-12-03 DIAGNOSIS — Z5111 Encounter for antineoplastic chemotherapy: Secondary | ICD-10-CM | POA: Diagnosis present

## 2018-12-03 DIAGNOSIS — C50411 Malignant neoplasm of upper-outer quadrant of right female breast: Secondary | ICD-10-CM

## 2018-12-03 LAB — CMP (CANCER CENTER ONLY)
ALT: 27 U/L (ref 0–44)
AST: 21 U/L (ref 15–41)
Albumin: 4 g/dL (ref 3.5–5.0)
Alkaline Phosphatase: 120 U/L (ref 38–126)
Anion gap: 8 (ref 5–15)
BUN: 15 mg/dL (ref 6–20)
CO2: 28 mmol/L (ref 22–32)
Calcium: 10 mg/dL (ref 8.9–10.3)
Chloride: 104 mmol/L (ref 98–111)
Creatinine: 0.78 mg/dL (ref 0.44–1.00)
GFR, Est AFR Am: >60 mL/min (ref >60)
GFR, Estimated: >60 mL/min (ref >60)
Glucose, Bld: 100 mg/dL — ABNORMAL HIGH (ref 70–99)
Potassium: 4.9 mmol/L (ref 3.5–5.1)
Sodium: 140 mmol/L (ref 135–145)
Total Bilirubin: 0.4 mg/dL (ref 0.3–1.2)
Total Protein: 7.7 g/dL (ref 6.5–8.1)

## 2018-12-03 LAB — CBC WITH DIFFERENTIAL (CANCER CENTER ONLY)
Abs Immature Granulocytes: 0.01 10*3/uL (ref 0.00–0.07)
Basophils Absolute: 0.1 10*3/uL (ref 0.0–0.1)
Basophils Relative: 1 %
Eosinophils Absolute: 0.3 10*3/uL (ref 0.0–0.5)
Eosinophils Relative: 5 %
HCT: 42.6 % (ref 36.0–46.0)
Hemoglobin: 13.9 g/dL (ref 12.0–15.0)
Immature Granulocytes: 0 %
Lymphocytes Relative: 27 %
Lymphs Abs: 1.6 10*3/uL (ref 0.7–4.0)
MCH: 29.8 pg (ref 26.0–34.0)
MCHC: 32.6 g/dL (ref 30.0–36.0)
MCV: 91.4 fL (ref 80.0–100.0)
Monocytes Absolute: 0.5 10*3/uL (ref 0.1–1.0)
Monocytes Relative: 9 %
Neutro Abs: 3.4 10*3/uL (ref 1.7–7.7)
Neutrophils Relative %: 58 %
Platelet Count: 362 10*3/uL (ref 150–400)
RBC: 4.66 MIL/uL (ref 3.87–5.11)
RDW: 13.2 % (ref 11.5–15.5)
WBC Count: 5.9 10*3/uL (ref 4.0–10.5)
nRBC: 0 % (ref 0.0–0.2)

## 2018-12-03 MED ORDER — FULVESTRANT 250 MG/5ML IM SOLN
500.0000 mg | INTRAMUSCULAR | Status: DC
  Administered 2018-12-03: 11:00:00 500 mg via INTRAMUSCULAR

## 2018-12-03 MED ORDER — FULVESTRANT 250 MG/5ML IM SOLN
INTRAMUSCULAR | Status: AC
  Filled 2018-12-03: qty 5

## 2018-12-03 NOTE — Patient Instructions (Signed)
Fulvestrant injection What is this medicine? FULVESTRANT (ful VES trant) blocks the effects of estrogen. It is used to treat breast cancer. This medicine may be used for other purposes; ask your health care provider or pharmacist if you have questions. COMMON BRAND NAME(S): FASLODEX What should I tell my health care provider before I take this medicine? They need to know if you have any of these conditions:  bleeding disorders  liver disease  low blood counts, like low white cell, platelet, or red cell counts  an unusual or allergic reaction to fulvestrant, other medicines, foods, dyes, or preservatives  pregnant or trying to get pregnant  breast-feeding How should I use this medicine? This medicine is for injection into a muscle. It is usually given by a health care professional in a hospital or clinic setting. Talk to your pediatrician regarding the use of this medicine in children. Special care may be needed. Overdosage: If you think you have taken too much of this medicine contact a poison control center or emergency room at once. NOTE: This medicine is only for you. Do not share this medicine with others. What if I miss a dose? It is important not to miss your dose. Call your doctor or health care professional if you are unable to keep an appointment. What may interact with this medicine?  medicines that treat or prevent blood clots like warfarin, enoxaparin, dalteparin, apixaban, dabigatran, and rivaroxaban This list may not describe all possible interactions. Give your health care provider a list of all the medicines, herbs, non-prescription drugs, or dietary supplements you use. Also tell them if you smoke, drink alcohol, or use illegal drugs. Some items may interact with your medicine. What should I watch for while using this medicine? Your condition will be monitored carefully while you are receiving this medicine. You will need important blood work done while you are taking  this medicine. Do not become pregnant while taking this medicine or for at least 1 year after stopping it. Women of child-bearing potential will need to have a negative pregnancy test before starting this medicine. Women should inform their doctor if they wish to become pregnant or think they might be pregnant. There is a potential for serious side effects to an unborn child. Men should inform their doctors if they wish to father a child. This medicine may lower sperm counts. Talk to your health care professional or pharmacist for more information. Do not breast-feed an infant while taking this medicine or for 1 year after the last dose. What side effects may I notice from receiving this medicine? Side effects that you should report to your doctor or health care professional as soon as possible:  allergic reactions like skin rash, itching or hives, swelling of the face, lips, or tongue  feeling faint or lightheaded, falls  pain, tingling, numbness, or weakness in the legs  signs and symptoms of infection like fever or chills; cough; flu-like symptoms; sore throat  vaginal bleeding Side effects that usually do not require medical attention (report to your doctor or health care professional if they continue or are bothersome):  aches, pains  constipation  diarrhea  headache  hot flashes  nausea, vomiting  pain at site where injected  stomach pain This list may not describe all possible side effects. Call your doctor for medical advice about side effects. You may report side effects to FDA at 1-800-FDA-1088. Where should I keep my medicine? This drug is given in a hospital or clinic and will   not be stored at home. NOTE: This sheet is a summary. It may not cover all possible information. If you have questions about this medicine, talk to your doctor, pharmacist, or health care provider.  2020 [employer] Standard (2017-05-24 11:34:41)  

## 2018-12-03 NOTE — Progress Notes (Signed)
[facility]  [phone] [phone]    ID: Elizabeth Miles DOB: [birth year]  MR#: [id number]  ENI#:[id number]  Patient Care Team: Clinton Quant, MD as PCP - General (Internal Medicine) Pomposini, Cherly Anderson, MD as Referring Physician (Internal Medicine) Rolm Bookbinder, MD as Consulting Physician (General Surgery) Cheyna Retana, Virgie Dad, MD as Consulting Physician (Oncology) Delice Bison, Charlestine Massed, NP as Nurse Practitioner (Hematology and Oncology) Pieter Partridge, DO as Consulting Physician (Neurology) Mansouraty, Telford Nab., MD as Consulting Physician (Gastroenterology) GYN: Servando Salina MD   CHIEF COMPLAINT: estrogen receptor positive breast cancer  CURRENT TREATMENT: Fulvestrant   BREAST CANCER HISTORY: From Dr. Dana Allan earlier notes:  "Elizabeth Miles is a 53 y.o. female. Without significant past medical history who on June 2013 had a mammogram that was normal. But there was on physical exam possibility of a cyst noted in the right breast. The mammogram was negative. In 2014 June patient noted on exam another lump in the right breast. She underwent a diagnostic mammogram on June 10 that showed a right breast nodule in the outer quadrant. She had an ultrasound performed that showed at the 9:30 o'clock position a 3.6 cm area and then added 10:00 position 1.3 cm area with a total area being anywhere between 5-6 cm. The patient went on to have a right breast biopsy performed in [place]. The pathology revealed an invasive ductal carcinoma. This is been confirmed by our pathology as well. The carcinoma and papillary features and was felt to be between a grade 1 and 2. The tumor was estrogen receptor positive strongly (100%) progesterone receptor negative HER-2/neu negative with a Ki-67 that showed a high proliferation rate. Patient is now seen in medical oncology for discussion of treatment options."  Bilateral breast MRI on 08/27/2012 revealed  in the right upper quadrant irregular lobulated mass with a satellite nodule or lobulation within 2 mm at its superior aspect, measured together as 4.5 x 4.0 x 3.8 cm. There was extension of enhancement to the nipple, suggesting nipple involvement may be present. No lymphadenopathy was noted there was no any other area of abnormal enhancement in either breast (clinical stage IIA, T2 N0).  PET scan performed on 08/29/2012 revealed the primary breast cancer measuring 3.2 x 3.3 cm with SUV of 16. It was adjacent nodule along the superiomedial border of the primary mass measuring 1.4 cm which was also hypermetabolic. There were no additional areas of abnormal hypermetabolism. No abnormal hypermetabolic activity was seen in the chest, abdomen/pelvis,within the liver, pancreas, adrenal glands or spleen. No hypermetabolic lymph nodes. In the skeleton, no focal hypermetabolic activity to suggest skeletal metastasis was seen.  Completed neoadjuvant chemotherapy consisting of Q14 day Adriamycin/Cytoxan x 4 cycles on 10/25/2012, followed by one dose of neoadjuvant Taxol on 11/08/2012. She developed significant grade 2 neuropathy and Taxol was discontinued. Then received neoadjuvant chemotherapy consisting of single agent Abraxane given on day 1, 8, 15 of each 28 day cycle. She completed therapy on 11/15/12 - 02/14/13.   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 Repeat HER-2 testing was now positive. The patient was started on tamoxifen March 2015 and on Herceptin the same month.  Her subsequent history is as detailed below.   INTERVAL HISTORY: Elizabeth Miles returns today for follow-up and treatment of her estrogen receptor positive ductal carcinoma. She was last seen here on  05/21/2018.   She continues on fulvestrant.  This makes her feel tired for a couple of days  and gives her a headache.  The shots themselves are not a major issue for her  Since her last visit here, she underwent colonoscopy under Dr. Rush Landmark..  She had some tubular and hyperplastic polyps.  Repeat colonoscopy in 7 years was suggested.    She is up to date on mammography, which was last completed on 04/09/2018.     REVIEW OF SYSTEMS:  Kasandra tells me she hurts all over.  She is taking gabapentin for this.  She is somewhat depressed.  Her divorce is still not finalized.  Her situation with her husband is conflictive, and she still has not completed the healthcare power of attorney documents that I gave her last time.  She only has 1 child at home, and he is cooling virtually.  A detailed review of systems today was otherwise stable   PAST MEDICAL HISTORY: Past Medical History:  Diagnosis Date   Allergy    Anemia    Anxiety    Breast cancer ([place]) 08/06/12   invasive ductal carcioma   Complication of anesthesia    1986 ; problem waking up   Depression    GERD (gastroesophageal reflux disease)    GERD (gastroesophageal reflux disease) 08/22/2012   Wears glasses     PAST SURGICAL HISTORY: Past Surgical History:  Procedure Laterality Date   APPENDECTOMY     BREAST BIOPSY Right 08/06/12   BREAST LUMPECTOMY WITH NEEDLE LOCALIZATION AND AXILLARY SENTINEL LYMPH NODE BX Right 03/10/2013   Procedure: BREAST LUMPECTOMY WITH NEEDLE LOCALIZATION AND AXILLARY SENTINEL LYMPH NODE BIOPSY;  Surgeon: Rolm Bookbinder, MD;  Location: [facility];  Service: General;  Laterality: Right;   [place] REMOVAL Left 03/10/2013   Procedure: REMOVAL PORT-A-CATH;  Surgeon: Rolm Bookbinder, MD;  Location: [facility];  Service: General;  Laterality: Left;   PORTACATH PLACEMENT Left 09/12/2012   Procedure: INSERTION  PORT-A-CATH;  Surgeon: Rolm Bookbinder, MD;  Location: [facility];  Service: General;  Laterality: Left;    FAMILY HISTORY Family History  Problem Relation Age of Onset   Hypertension Mother    Uterine cancer Mother    Heart disease Mother    Bladder Cancer Maternal Uncle 22   Cancer Paternal Grandmother 38       lung cancer   Lung cancer Paternal Grandfather        dx in his 24s   Prostate cancer Father    COPD Paternal Uncle    Colon cancer Neg Hx    Esophageal cancer Neg Hx    Stomach cancer Neg Hx    Rectal cancer Neg Hx    The patient's parents are living and in good health. The patient has one brother, no sisters. There is no history of breast or ovarian cancer in the family to her knowledge   GYNECOLOGIC HISTORY:  Patient's last menstrual period was 03/12/2012. Menarche age 71, first live birth age 89. The patient is GX P3. 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 She is separated from her husband, Jori Moll, who is a Tour manager.  They are in process  of divorce.  Venda's daughter, Lovena Le, lives in [place] and daughter Caryl Pina has a baby girl, the patient's first grandchild, 62 years old as of December 2020; she stays with Coralyn Mark frequently. Natalee's son, Jori Moll "Carlynn Spry" is 59 as of December 2020 and lives at home. The patient is a Psychologist, forensic.    ADVANCED DIRECTIVES: Randee was given the appropriate forms on 05/21/2018 to fill out and return at their own discretion.     HEALTH MAINTENANCE: Social History   Tobacco Use   Smoking status: Never Smoker   Smokeless tobacco: Never Used  Substance Use Topics   Alcohol use: No   Drug use: No     Colonoscopy:  PAP:  Bone density:  Lipid panel:  Allergies  Allergen Reactions   Anastrozole Anaphylaxis   Contrast Media [Iodinated  Diagnostic Agents] Shortness Of Breath    Difficulty breathing with chills on 08/29/2012   Darvocet [Propoxyphene N-Acetaminophen] Shortness Of Breath and Swelling   Propoxyphene Anaphylaxis, Shortness Of Breath and Swelling   Shellfish Allergy Shortness Of Breath and Swelling   Other     CT dye, "chest hurt" "makes me feel cold"   Tape     petechiae/redness    Current Outpatient Medications  Medication Sig Dispense Refill   acetaminophen (TYLENOL) 500 MG tablet Take 1,000 mg by mouth every 6 (six) hours as needed. Reported on 06/14/2015     Ascorbic Acid (VITAMIN C) 1000 MG tablet Take 1,000 mg by mouth daily.     aspirin EC 81 MG tablet Take by mouth.     cetirizine (ZYRTEC) 10 MG chewable tablet Chew 10 mg by mouth daily.     Cholecalciferol (VITAMIN D3) 25 MCG (1000 UT) CAPS TAKE 1 CAPSULE BY MOUTH ONCE DAILY. 90 capsule 3   Dexlansoprazole (DEXILANT) 30 MG capsule Take 1 capsule (30 mg total) by mouth daily. 90 capsule 4   gabapentin (NEURONTIN) 300 MG capsule TAKE ONE CAPSULE 3 TIMES A DAY 90 capsule 3   ibuprofen (ADVIL,MOTRIN) 800 MG tablet Take 800 mg by mouth every 8 (eight) hours as needed for moderate pain (for back pain). Reported on 06/14/2015     polyethylene glycol (MIRALAX / GLYCOLAX) packet Take 17 g by mouth daily.     predniSONE (DELTASONE) 50 MG tablet Take 50 mg 13, 7, and 1 hours before CT contrast injection 3 tablet 0   venlafaxine XR (EFFEXOR-XR) 150 MG 24 hr capsule TAKE 1 CAPSULE (150 MG TOTAL) BY MOUTH DAILY WITH BREAKFAST. 90 capsule 4   No current facility-administered medications for this visit.    Facility-Administered Medications Ordered in Other Visits  Medication Dose Route Frequency Provider Last Rate Last Dose   fulvestrant (FASLODEX) injection 500 mg  500 mg Intramuscular Q30 days Helio Lack, Virgie Dad, MD        OBJECTIVE: Middle-aged white woman in no acute distress  Vitals:   12/03/18 1017  BP: 122/83  Pulse: 90  Resp: 20    Temp: 98 F (36.7 C)  SpO2: 98%   Wt Readings from Last 3 Encounters:  12/03/18 229 lb 4.8 oz (104 kg)  08/29/18 235 lb (106.6 kg)  08/15/18 235 lb (106.6 kg)   Body mass index is 37.01 kg/m.    ECOG FS:1 - Symptomatic but completely ambulatory  Ocular: Sclerae unicteric, pupils round and equal Ear-nose-throat: Wearing a mask Lymphatic: No cervical or supraclavicular adenopathy Lungs no rales or rhonchi Heart regular rate and rhythm Abd soft, nontender, positive bowel sounds MSK no focal spinal  tenderness, no joint edema Neuro: non-focal, well-oriented, appropriate affect Breasts: The right breast has undergone lumpectomy and radiation with no evidence of disease recurrence.  The left breast is benign.  Both axillae are benign.   LAB RESULTS:  CMP     Component Value Date/Time   NA 140 12/03/2018 1000   NA 139 03/01/2017 1021   K 4.9 12/03/2018 1000   K 4.5 03/01/2017 1021   CL 104 12/03/2018 1000   CO2 28 12/03/2018 1000   CO2 27 03/01/2017 1021   GLUCOSE 100 (H) 12/03/2018 1000   GLUCOSE 92 03/01/2017 1021   BUN 15 12/03/2018 1000   BUN 24.0 03/01/2017 1021   CREATININE 0.78 12/03/2018 1000   CREATININE 0.8 03/01/2017 1021   CALCIUM 10.0 12/03/2018 1000   CALCIUM 9.8 03/01/2017 1021   PROT 7.7 12/03/2018 1000   PROT 7.6 03/01/2017 1021   ALBUMIN 4.0 12/03/2018 1000   ALBUMIN 4.1 03/01/2017 1021   AST 21 12/03/2018 1000   AST 13 03/01/2017 1021   ALT 27 12/03/2018 1000   ALT 17 03/01/2017 1021   ALKPHOS 120 12/03/2018 1000   ALKPHOS 94 03/01/2017 1021   BILITOT 0.4 12/03/2018 1000   BILITOT 0.45 03/01/2017 1021   GFRNONAA >60 12/03/2018 1000   GFRAA >60 12/03/2018 1000    I No results found for: SPEP  Lab Results  Component Value Date   WBC 5.9 12/03/2018   NEUTROABS 3.4 12/03/2018   HGB 13.9 12/03/2018   HCT 42.6 12/03/2018   MCV 91.4 12/03/2018   PLT 362 12/03/2018      Chemistry      Component Value Date/Time   NA 140 12/03/2018 1000    NA 139 03/01/2017 1021   K 4.9 12/03/2018 1000   K 4.5 03/01/2017 1021   CL 104 12/03/2018 1000   CO2 28 12/03/2018 1000   CO2 27 03/01/2017 1021   BUN 15 12/03/2018 1000   BUN 24.0 03/01/2017 1021   CREATININE 0.78 12/03/2018 1000   CREATININE 0.8 03/01/2017 1021      Component Value Date/Time   CALCIUM 10.0 12/03/2018 1000   CALCIUM 9.8 03/01/2017 1021   ALKPHOS 120 12/03/2018 1000   ALKPHOS 94 03/01/2017 1021   AST 21 12/03/2018 1000   AST 13 03/01/2017 1021   ALT 27 12/03/2018 1000   ALT 17 03/01/2017 1021   BILITOT 0.4 12/03/2018 1000   BILITOT 0.45 03/01/2017 1021       No results found for: LABCA2  No components found for: LABCA125  No results for input(s): INR in the last 168 hours.  Urinalysis    Component Value Date/Time   LABSPEC 1.020 09/28/2015 1224   PHURINE 5.0 09/28/2015 1224   GLUCOSEU Negative 09/28/2015 1224   HGBUR Negative 09/28/2015 1224   BILIRUBINUR Negative 09/28/2015 1224   KETONESUR Negative 09/28/2015 1224   PROTEINUR Negative 09/28/2015 1224   UROBILINOGEN 0.2 09/28/2015 1224   NITRITE Negative 09/28/2015 1224   LEUKOCYTESUR Negative 09/28/2015 1224    STUDIES:  No results found.   ASSESSMENT: 53 y.o. [place] woman  (1) status post right breast upper outer quadrant biopsy 08/06/2012 for a clinical mT2 N0, stage IIA invasive ductal carcinoma, estrogen receptor 3+ positive, progesterone receptor and HER-2 negative, Ki67 3+ [[id number]]  (2) treated neoadjuvantly with dose dense cyclophosphamide and doxorubicin x4, completed 10/25/2012, followed by a single dose of weekly paclitaxel, poorly tolerated; followed by 11 doses of Abraxane completed 02/14/2013  (3) status post right lumpectomy  and sentinel lymph node sampling 03/10/2013 for a ypT2 pN0, stage IIA invasive ductal carcinoma, grade 2, estrogen receptor 100% positive, progesterone receptor 21% positive, with an MIB-1 of 14%, and HER-2 amplified, the signals ratio  being 2.52, the number per cell 2.90  (a) reclassified as stage IB in the 2018 prognostic classification  (4) adjuvant radiation completed March 2015 (?)  (5) started tamoxifen March 2015, stopped 05/25/14 because of superficial clot to left cephalic vein; began anastrozole on 05/29/14, discontinued after 2 weeks with rash; started letrozole 05/24/2015, discontinued after 5 days use because of angioedema-like symptoms  (6) started trastuzumab 05/23/2013, completed 05/15/14  (7) osteopenia: With T score of -1.7 on bone density scan at [facility] 06/10/2014.   (a) with dental clearance started denosumab/Prolia 12/09/2015  (b) denosumab/Prolia discontinued after October 2017 dose at patient's request  (8) fulvestrant started 06/23/2015   PLAN: Elene is now almost 6 years out from definitive surgery for her breast cancer with no evidence of disease recurrence.  This is favorable.  She is tolerating fulvestrant generally well and the plan is to continue that through March 2021.  I again gave her a copy of the healthcare power of attorney documents so she can get them notarized and change the healthcare power of attorney from her husband to someone else given her current conflicted relationship with him  She knows to call for any other issue that may develop before her next visit.  Shaliyah Taite, Virgie Dad, MD  12/03/18 10:58 AM Medical Oncology and Hematology [facility] [address] [place], [place] [address] Tel. [phone]    Fax. [phone]  I, Jacqualyn Posey am acting as a Education administrator for Chauncey Cruel, MD.   I, Lurline Del MD, have reviewed the above documentation for accuracy and completeness, and I agree with the above.

## 2018-12-05 ENCOUNTER — Telehealth: Payer: Self-pay | Admitting: Oncology

## 2018-12-05 NOTE — Telephone Encounter (Signed)
I talk with patient regarding schedule  

## 2018-12-31 ENCOUNTER — Inpatient Hospital Stay: Payer: BC Managed Care – PPO

## 2018-12-31 ENCOUNTER — Other Ambulatory Visit: Payer: Self-pay

## 2018-12-31 ENCOUNTER — Inpatient Hospital Stay: Payer: BC Managed Care – PPO | Attending: Oncology

## 2018-12-31 VITALS — BP 133/87 | HR 94 | Temp 98.9°F | Resp 18

## 2018-12-31 DIAGNOSIS — C50411 Malignant neoplasm of upper-outer quadrant of right female breast: Secondary | ICD-10-CM | POA: Insufficient documentation

## 2018-12-31 DIAGNOSIS — M8580 Other specified disorders of bone density and structure, unspecified site: Secondary | ICD-10-CM

## 2018-12-31 DIAGNOSIS — Z170 Estrogen receptor positive status [ER+]: Secondary | ICD-10-CM

## 2018-12-31 DIAGNOSIS — Z5111 Encounter for antineoplastic chemotherapy: Secondary | ICD-10-CM | POA: Diagnosis present

## 2018-12-31 LAB — CMP (CANCER CENTER ONLY)
ALT: 22 U/L (ref 0–44)
AST: 17 U/L (ref 15–41)
Albumin: 3.9 g/dL (ref 3.5–5.0)
Alkaline Phosphatase: 123 U/L (ref 38–126)
Anion gap: 9 (ref 5–15)
BUN: 10 mg/dL (ref 6–20)
CO2: 28 mmol/L (ref 22–32)
Calcium: 9.6 mg/dL (ref 8.9–10.3)
Chloride: 102 mmol/L (ref 98–111)
Creatinine: 0.78 mg/dL (ref 0.44–1.00)
GFR, Est AFR Am: >60 mL/min (ref >60)
GFR, Estimated: >60 mL/min (ref >60)
Glucose, Bld: 99 mg/dL (ref 70–99)
Potassium: 4.3 mmol/L (ref 3.5–5.1)
Sodium: 139 mmol/L (ref 135–145)
Total Bilirubin: <0.2 mg/dL — ABNORMAL LOW (ref 0.3–1.2)
Total Protein: 7.7 g/dL (ref 6.5–8.1)

## 2018-12-31 LAB — CBC WITH DIFFERENTIAL (CANCER CENTER ONLY)
Abs Immature Granulocytes: 0.02 10*3/uL (ref 0.00–0.07)
Basophils Absolute: 0.1 10*3/uL (ref 0.0–0.1)
Basophils Relative: 1 %
Eosinophils Absolute: 0.3 10*3/uL (ref 0.0–0.5)
Eosinophils Relative: 5 %
HCT: 42 % (ref 36.0–46.0)
Hemoglobin: 13.9 g/dL (ref 12.0–15.0)
Immature Granulocytes: 0 %
Lymphocytes Relative: 30 %
Lymphs Abs: 2 10*3/uL (ref 0.7–4.0)
MCH: 30.2 pg (ref 26.0–34.0)
MCHC: 33.1 g/dL (ref 30.0–36.0)
MCV: 91.3 fL (ref 80.0–100.0)
Monocytes Absolute: 0.5 10*3/uL (ref 0.1–1.0)
Monocytes Relative: 7 %
Neutro Abs: 3.9 10*3/uL (ref 1.7–7.7)
Neutrophils Relative %: 57 %
Platelet Count: 336 10*3/uL (ref 150–400)
RBC: 4.6 MIL/uL (ref 3.87–5.11)
RDW: 12.8 % (ref 11.5–15.5)
WBC Count: 6.8 10*3/uL (ref 4.0–10.5)
nRBC: 0 % (ref 0.0–0.2)

## 2018-12-31 MED ORDER — FULVESTRANT 250 MG/5ML IM SOLN
INTRAMUSCULAR | Status: AC
  Filled 2018-12-31: qty 5

## 2018-12-31 MED ORDER — FULVESTRANT 250 MG/5ML IM SOLN
500.0000 mg | INTRAMUSCULAR | Status: DC
  Administered 2018-12-31: 500 mg via INTRAMUSCULAR

## 2019-01-02 ENCOUNTER — Other Ambulatory Visit: Payer: Self-pay | Admitting: Oncology

## 2019-01-27 ENCOUNTER — Other Ambulatory Visit: Payer: Self-pay

## 2019-01-27 DIAGNOSIS — Z170 Estrogen receptor positive status [ER+]: Secondary | ICD-10-CM

## 2019-01-27 DIAGNOSIS — C50411 Malignant neoplasm of upper-outer quadrant of right female breast: Secondary | ICD-10-CM

## 2019-01-28 ENCOUNTER — Inpatient Hospital Stay: Payer: BC Managed Care – PPO | Attending: Oncology

## 2019-01-28 ENCOUNTER — Inpatient Hospital Stay: Payer: BC Managed Care – PPO

## 2019-01-28 ENCOUNTER — Other Ambulatory Visit: Payer: Self-pay

## 2019-01-28 VITALS — BP 145/90 | HR 87 | Temp 98.3°F | Resp 20

## 2019-01-28 DIAGNOSIS — Z170 Estrogen receptor positive status [ER+]: Secondary | ICD-10-CM

## 2019-01-28 DIAGNOSIS — M8580 Other specified disorders of bone density and structure, unspecified site: Secondary | ICD-10-CM

## 2019-01-28 DIAGNOSIS — Z5111 Encounter for antineoplastic chemotherapy: Secondary | ICD-10-CM | POA: Diagnosis present

## 2019-01-28 DIAGNOSIS — C50411 Malignant neoplasm of upper-outer quadrant of right female breast: Secondary | ICD-10-CM | POA: Diagnosis present

## 2019-01-28 LAB — CBC WITH DIFFERENTIAL (CANCER CENTER ONLY)
Abs Immature Granulocytes: 0.02 10*3/uL (ref 0.00–0.07)
Basophils Absolute: 0.1 10*3/uL (ref 0.0–0.1)
Basophils Relative: 1 %
Eosinophils Absolute: 0.3 10*3/uL (ref 0.0–0.5)
Eosinophils Relative: 6 %
HCT: 42.9 % (ref 36.0–46.0)
Hemoglobin: 13.8 g/dL (ref 12.0–15.0)
Immature Granulocytes: 0 %
Lymphocytes Relative: 34 %
Lymphs Abs: 1.7 10*3/uL (ref 0.7–4.0)
MCH: 29.7 pg (ref 26.0–34.0)
MCHC: 32.2 g/dL (ref 30.0–36.0)
MCV: 92.5 fL (ref 80.0–100.0)
Monocytes Absolute: 0.5 10*3/uL (ref 0.1–1.0)
Monocytes Relative: 9 %
Neutro Abs: 2.6 10*3/uL (ref 1.7–7.7)
Neutrophils Relative %: 50 %
Platelet Count: 339 10*3/uL (ref 150–400)
RBC: 4.64 MIL/uL (ref 3.87–5.11)
RDW: 12.8 % (ref 11.5–15.5)
WBC Count: 5.1 10*3/uL (ref 4.0–10.5)
nRBC: 0 % (ref 0.0–0.2)

## 2019-01-28 LAB — CMP (CANCER CENTER ONLY)
ALT: 29 U/L (ref 0–44)
AST: 20 U/L (ref 15–41)
Albumin: 4 g/dL (ref 3.5–5.0)
Alkaline Phosphatase: 112 U/L (ref 38–126)
Anion gap: 10 (ref 5–15)
BUN: 9 mg/dL (ref 6–20)
CO2: 27 mmol/L (ref 22–32)
Calcium: 9.9 mg/dL (ref 8.9–10.3)
Chloride: 103 mmol/L (ref 98–111)
Creatinine: 0.76 mg/dL (ref 0.44–1.00)
GFR, Est AFR Am: >60 mL/min (ref >60)
GFR, Estimated: >60 mL/min (ref >60)
Glucose, Bld: 85 mg/dL (ref 70–99)
Potassium: 4.5 mmol/L (ref 3.5–5.1)
Sodium: 140 mmol/L (ref 135–145)
Total Bilirubin: 0.4 mg/dL (ref 0.3–1.2)
Total Protein: 7.5 g/dL (ref 6.5–8.1)

## 2019-01-28 MED ORDER — FULVESTRANT 250 MG/5ML IM SOLN
INTRAMUSCULAR | Status: AC
  Filled 2019-01-28: qty 5

## 2019-01-28 MED ORDER — FULVESTRANT 250 MG/5ML IM SOLN
500.0000 mg | INTRAMUSCULAR | Status: DC
  Administered 2019-01-28: 500 mg via INTRAMUSCULAR

## 2019-01-28 NOTE — Patient Instructions (Signed)
Fulvestrant injection What is this medicine? FULVESTRANT (ful VES trant) blocks the effects of estrogen. It is used to treat breast cancer. This medicine may be used for other purposes; ask your health care provider or pharmacist if you have questions. COMMON BRAND NAME(S): FASLODEX What should I tell my health care provider before I take this medicine? They need to know if you have any of these conditions:  bleeding disorders  liver disease  low blood counts, like low white cell, platelet, or red cell counts  an unusual or allergic reaction to fulvestrant, other medicines, foods, dyes, or preservatives  pregnant or trying to get pregnant  breast-feeding How should I use this medicine? This medicine is for injection into a muscle. It is usually given by a health care professional in a hospital or clinic setting. Talk to your pediatrician regarding the use of this medicine in children. Special care may be needed. Overdosage: If you think you have taken too much of this medicine contact a poison control center or emergency room at once. NOTE: This medicine is only for you. Do not share this medicine with others. What if I miss a dose? It is important not to miss your dose. Call your doctor or health care professional if you are unable to keep an appointment. What may interact with this medicine?  medicines that treat or prevent blood clots like warfarin, enoxaparin, dalteparin, apixaban, dabigatran, and rivaroxaban This list may not describe all possible interactions. Give your health care provider a list of all the medicines, herbs, non-prescription drugs, or dietary supplements you use. Also tell them if you smoke, drink alcohol, or use illegal drugs. Some items may interact with your medicine. What should I watch for while using this medicine? Your condition will be monitored carefully while you are receiving this medicine. You will need important blood work done while you are taking  this medicine. Do not become pregnant while taking this medicine or for at least 1 year after stopping it. Women of child-bearing potential will need to have a negative pregnancy test before starting this medicine. Women should inform their doctor if they wish to become pregnant or think they might be pregnant. There is a potential for serious side effects to an unborn child. Men should inform their doctors if they wish to father a child. This medicine may lower sperm counts. Talk to your health care professional or pharmacist for more information. Do not breast-feed an infant while taking this medicine or for 1 year after the last dose. What side effects may I notice from receiving this medicine? Side effects that you should report to your doctor or health care professional as soon as possible:  allergic reactions like skin rash, itching or hives, swelling of the face, lips, or tongue  feeling faint or lightheaded, falls  pain, tingling, numbness, or weakness in the legs  signs and symptoms of infection like fever or chills; cough; flu-like symptoms; sore throat  vaginal bleeding Side effects that usually do not require medical attention (report to your doctor or health care professional if they continue or are bothersome):  aches, pains  constipation  diarrhea  headache  hot flashes  nausea, vomiting  pain at site where injected  stomach pain This list may not describe all possible side effects. Call your doctor for medical advice about side effects. You may report side effects to FDA at 1-800-FDA-1088. Where should I keep my medicine? This drug is given in a hospital or clinic and will   not be stored at home. NOTE: This sheet is a summary. It may not cover all possible information. If you have questions about this medicine, talk to your doctor, pharmacist, or health care provider.  2020 [employer] Standard (2017-05-24 11:34:41)  

## 2019-02-04 ENCOUNTER — Telehealth: Payer: Self-pay

## 2019-02-04 NOTE — Telephone Encounter (Signed)
Gabapentin 300 mg 1 capsule 3 times a day called in to [facility].

## 2019-02-25 ENCOUNTER — Inpatient Hospital Stay: Payer: BC Managed Care – PPO

## 2019-02-25 ENCOUNTER — Other Ambulatory Visit: Payer: Self-pay

## 2019-02-25 VITALS — BP 138/78 | HR 87 | Temp 98.2°F | Resp 18

## 2019-02-25 DIAGNOSIS — C50411 Malignant neoplasm of upper-outer quadrant of right female breast: Secondary | ICD-10-CM | POA: Diagnosis not present

## 2019-02-25 DIAGNOSIS — M8580 Other specified disorders of bone density and structure, unspecified site: Secondary | ICD-10-CM

## 2019-02-25 LAB — CBC WITH DIFFERENTIAL (CANCER CENTER ONLY)
Abs Immature Granulocytes: 0.02 10*3/uL (ref 0.00–0.07)
Basophils Absolute: 0 10*3/uL (ref 0.0–0.1)
Basophils Relative: 1 %
Eosinophils Absolute: 0.3 10*3/uL (ref 0.0–0.5)
Eosinophils Relative: 5 %
HCT: 42.8 % (ref 36.0–46.0)
Hemoglobin: 14.1 g/dL (ref 12.0–15.0)
Immature Granulocytes: 0 %
Lymphocytes Relative: 29 %
Lymphs Abs: 1.5 10*3/uL (ref 0.7–4.0)
MCH: 29.6 pg (ref 26.0–34.0)
MCHC: 32.9 g/dL (ref 30.0–36.0)
MCV: 89.9 fL (ref 80.0–100.0)
Monocytes Absolute: 0.4 10*3/uL (ref 0.1–1.0)
Monocytes Relative: 8 %
Neutro Abs: 3 10*3/uL (ref 1.7–7.7)
Neutrophils Relative %: 57 %
Platelet Count: 330 10*3/uL (ref 150–400)
RBC: 4.76 MIL/uL (ref 3.87–5.11)
RDW: 12.7 % (ref 11.5–15.5)
WBC Count: 5.3 10*3/uL (ref 4.0–10.5)
nRBC: 0 % (ref 0.0–0.2)

## 2019-02-25 LAB — CMP (CANCER CENTER ONLY)
ALT: 23 U/L (ref 0–44)
AST: 19 U/L (ref 15–41)
Albumin: 3.9 g/dL (ref 3.5–5.0)
Alkaline Phosphatase: 118 U/L (ref 38–126)
Anion gap: 12 (ref 5–15)
BUN: 10 mg/dL (ref 6–20)
CO2: 25 mmol/L (ref 22–32)
Calcium: 9.6 mg/dL (ref 8.9–10.3)
Chloride: 105 mmol/L (ref 98–111)
Creatinine: 0.77 mg/dL (ref 0.44–1.00)
GFR, Est AFR Am: >60 mL/min (ref >60)
GFR, Estimated: >60 mL/min (ref >60)
Glucose, Bld: 126 mg/dL — ABNORMAL HIGH (ref 70–99)
Potassium: 4.4 mmol/L (ref 3.5–5.1)
Sodium: 142 mmol/L (ref 135–145)
Total Bilirubin: 0.3 mg/dL (ref 0.3–1.2)
Total Protein: 7.5 g/dL (ref 6.5–8.1)

## 2019-02-25 MED ORDER — FULVESTRANT 250 MG/5ML IM SOLN
INTRAMUSCULAR | Status: AC
  Filled 2019-02-25: qty 10

## 2019-02-25 MED ORDER — FULVESTRANT 250 MG/5ML IM SOLN
500.0000 mg | INTRAMUSCULAR | Status: DC
  Administered 2019-02-25: 500 mg via INTRAMUSCULAR

## 2019-02-25 NOTE — Patient Instructions (Signed)
Fulvestrant injection What is this medicine? FULVESTRANT (ful VES trant) blocks the effects of estrogen. It is used to treat breast cancer. This medicine may be used for other purposes; ask your health care provider or pharmacist if you have questions. COMMON BRAND NAME(S): FASLODEX What should I tell my health care provider before I take this medicine? They need to know if you have any of these conditions:  bleeding disorders  liver disease  low blood counts, like low white cell, platelet, or red cell counts  an unusual or allergic reaction to fulvestrant, other medicines, foods, dyes, or preservatives  pregnant or trying to get pregnant  breast-feeding How should I use this medicine? This medicine is for injection into a muscle. It is usually given by a health care professional in a hospital or clinic setting. Talk to your pediatrician regarding the use of this medicine in children. Special care may be needed. Overdosage: If you think you have taken too much of this medicine contact a poison control center or emergency room at once. NOTE: This medicine is only for you. Do not share this medicine with others. What if I miss a dose? It is important not to miss your dose. Call your doctor or health care professional if you are unable to keep an appointment. What may interact with this medicine?  medicines that treat or prevent blood clots like warfarin, enoxaparin, dalteparin, apixaban, dabigatran, and rivaroxaban This list may not describe all possible interactions. Give your health care provider a list of all the medicines, herbs, non-prescription drugs, or dietary supplements you use. Also tell them if you smoke, drink alcohol, or use illegal drugs. Some items may interact with your medicine. What should I watch for while using this medicine? Your condition will be monitored carefully while you are receiving this medicine. You will need important blood work done while you are taking  this medicine. Do not become pregnant while taking this medicine or for at least 1 year after stopping it. Women of child-bearing potential will need to have a negative pregnancy test before starting this medicine. Women should inform their doctor if they wish to become pregnant or think they might be pregnant. There is a potential for serious side effects to an unborn child. Men should inform their doctors if they wish to father a child. This medicine may lower sperm counts. Talk to your health care professional or pharmacist for more information. Do not breast-feed an infant while taking this medicine or for 1 year after the last dose. What side effects may I notice from receiving this medicine? Side effects that you should report to your doctor or health care professional as soon as possible:  allergic reactions like skin rash, itching or hives, swelling of the face, lips, or tongue  feeling faint or lightheaded, falls  pain, tingling, numbness, or weakness in the legs  signs and symptoms of infection like fever or chills; cough; flu-like symptoms; sore throat  vaginal bleeding Side effects that usually do not require medical attention (report to your doctor or health care professional if they continue or are bothersome):  aches, pains  constipation  diarrhea  headache  hot flashes  nausea, vomiting  pain at site where injected  stomach pain This list may not describe all possible side effects. Call your doctor for medical advice about side effects. You may report side effects to FDA at 1-800-FDA-1088. Where should I keep my medicine? This drug is given in a hospital or clinic and will   not be stored at home. NOTE: This sheet is a summary. It may not cover all possible information. If you have questions about this medicine, talk to your doctor, pharmacist, or health care provider.  2020 [employer] Standard (2017-05-24 11:34:41)  

## 2019-03-25 ENCOUNTER — Inpatient Hospital Stay: Payer: BC Managed Care – PPO | Attending: Oncology

## 2019-03-25 ENCOUNTER — Inpatient Hospital Stay: Payer: BC Managed Care – PPO

## 2019-03-25 ENCOUNTER — Other Ambulatory Visit: Payer: Self-pay

## 2019-03-25 VITALS — BP 143/95 | HR 82 | Temp 98.0°F | Resp 20

## 2019-03-25 DIAGNOSIS — Z5111 Encounter for antineoplastic chemotherapy: Secondary | ICD-10-CM | POA: Diagnosis present

## 2019-03-25 DIAGNOSIS — M8580 Other specified disorders of bone density and structure, unspecified site: Secondary | ICD-10-CM

## 2019-03-25 DIAGNOSIS — Z923 Personal history of irradiation: Secondary | ICD-10-CM | POA: Diagnosis not present

## 2019-03-25 DIAGNOSIS — Z170 Estrogen receptor positive status [ER+]: Secondary | ICD-10-CM | POA: Diagnosis not present

## 2019-03-25 DIAGNOSIS — Z9221 Personal history of antineoplastic chemotherapy: Secondary | ICD-10-CM | POA: Insufficient documentation

## 2019-03-25 DIAGNOSIS — Z7982 Long term (current) use of aspirin: Secondary | ICD-10-CM | POA: Insufficient documentation

## 2019-03-25 DIAGNOSIS — C50411 Malignant neoplasm of upper-outer quadrant of right female breast: Secondary | ICD-10-CM

## 2019-03-25 DIAGNOSIS — Z79899 Other long term (current) drug therapy: Secondary | ICD-10-CM | POA: Diagnosis not present

## 2019-03-25 LAB — CBC WITH DIFFERENTIAL (CANCER CENTER ONLY)
Abs Immature Granulocytes: 0.02 10*3/uL (ref 0.00–0.07)
Basophils Absolute: 0 10*3/uL (ref 0.0–0.1)
Basophils Relative: 1 %
Eosinophils Absolute: 0.3 10*3/uL (ref 0.0–0.5)
Eosinophils Relative: 5 %
HCT: 41.9 % (ref 36.0–46.0)
Hemoglobin: 13.6 g/dL (ref 12.0–15.0)
Immature Granulocytes: 0 %
Lymphocytes Relative: 32 %
Lymphs Abs: 1.6 10*3/uL (ref 0.7–4.0)
MCH: 29.8 pg (ref 26.0–34.0)
MCHC: 32.5 g/dL (ref 30.0–36.0)
MCV: 91.9 fL (ref 80.0–100.0)
Monocytes Absolute: 0.3 10*3/uL (ref 0.1–1.0)
Monocytes Relative: 6 %
Neutro Abs: 2.7 10*3/uL (ref 1.7–7.7)
Neutrophils Relative %: 56 %
Platelet Count: 339 10*3/uL (ref 150–400)
RBC: 4.56 MIL/uL (ref 3.87–5.11)
RDW: 12.9 % (ref 11.5–15.5)
WBC Count: 4.8 10*3/uL (ref 4.0–10.5)
nRBC: 0 % (ref 0.0–0.2)

## 2019-03-25 LAB — CMP (CANCER CENTER ONLY)
ALT: 22 U/L (ref 0–44)
AST: 15 U/L (ref 15–41)
Albumin: 3.9 g/dL (ref 3.5–5.0)
Alkaline Phosphatase: 117 U/L (ref 38–126)
Anion gap: 10 (ref 5–15)
BUN: 14 mg/dL (ref 6–20)
CO2: 28 mmol/L (ref 22–32)
Calcium: 9.4 mg/dL (ref 8.9–10.3)
Chloride: 105 mmol/L (ref 98–111)
Creatinine: 0.77 mg/dL (ref 0.44–1.00)
GFR, Est AFR Am: >60 mL/min (ref >60)
GFR, Estimated: >60 mL/min (ref >60)
Glucose, Bld: 108 mg/dL — ABNORMAL HIGH (ref 70–99)
Potassium: 4.2 mmol/L (ref 3.5–5.1)
Sodium: 143 mmol/L (ref 135–145)
Total Bilirubin: 0.3 mg/dL (ref 0.3–1.2)
Total Protein: 7.5 g/dL (ref 6.5–8.1)

## 2019-03-25 MED ORDER — FULVESTRANT 250 MG/5ML IM SOLN
500.0000 mg | INTRAMUSCULAR | Status: DC
  Administered 2019-03-25: 500 mg via INTRAMUSCULAR

## 2019-03-25 MED ORDER — FULVESTRANT 250 MG/5ML IM SOLN
INTRAMUSCULAR | Status: AC
  Filled 2019-03-25: qty 10

## 2019-03-25 NOTE — Patient Instructions (Signed)
Fulvestrant injection What is this medicine? FULVESTRANT (ful VES trant) blocks the effects of estrogen. It is used to treat breast cancer. This medicine may be used for other purposes; ask your health care provider or pharmacist if you have questions. COMMON BRAND NAME(S): FASLODEX What should I tell my health care provider before I take this medicine? They need to know if you have any of these conditions:  bleeding disorders  liver disease  low blood counts, like low white cell, platelet, or red cell counts  an unusual or allergic reaction to fulvestrant, other medicines, foods, dyes, or preservatives  pregnant or trying to get pregnant  breast-feeding How should I use this medicine? This medicine is for injection into a muscle. It is usually given by a health care professional in a hospital or clinic setting. Talk to your pediatrician regarding the use of this medicine in children. Special care may be needed. Overdosage: If you think you have taken too much of this medicine contact a poison control center or emergency room at once. NOTE: This medicine is only for you. Do not share this medicine with others. What if I miss a dose? It is important not to miss your dose. Call your doctor or health care professional if you are unable to keep an appointment. What may interact with this medicine?  medicines that treat or prevent blood clots like warfarin, enoxaparin, dalteparin, apixaban, dabigatran, and rivaroxaban This list may not describe all possible interactions. Give your health care provider a list of all the medicines, herbs, non-prescription drugs, or dietary supplements you use. Also tell them if you smoke, drink alcohol, or use illegal drugs. Some items may interact with your medicine. What should I watch for while using this medicine? Your condition will be monitored carefully while you are receiving this medicine. You will need important blood work done while you are taking  this medicine. Do not become pregnant while taking this medicine or for at least 1 year after stopping it. Women of child-bearing potential will need to have a negative pregnancy test before starting this medicine. Women should inform their doctor if they wish to become pregnant or think they might be pregnant. There is a potential for serious side effects to an unborn child. Men should inform their doctors if they wish to father a child. This medicine may lower sperm counts. Talk to your health care professional or pharmacist for more information. Do not breast-feed an infant while taking this medicine or for 1 year after the last dose. What side effects may I notice from receiving this medicine? Side effects that you should report to your doctor or health care professional as soon as possible:  allergic reactions like skin rash, itching or hives, swelling of the face, lips, or tongue  feeling faint or lightheaded, falls  pain, tingling, numbness, or weakness in the legs  signs and symptoms of infection like fever or chills; cough; flu-like symptoms; sore throat  vaginal bleeding Side effects that usually do not require medical attention (report to your doctor or health care professional if they continue or are bothersome):  aches, pains  constipation  diarrhea  headache  hot flashes  nausea, vomiting  pain at site where injected  stomach pain This list may not describe all possible side effects. Call your doctor for medical advice about side effects. You may report side effects to FDA at 1-800-FDA-1088. Where should I keep my medicine? This drug is given in a hospital or clinic and will   not be stored at home. NOTE: This sheet is a summary. It may not cover all possible information. If you have questions about this medicine, talk to your doctor, pharmacist, or health care provider.  2020 [employer] Standard (2017-05-24 11:34:41)  

## 2019-04-22 ENCOUNTER — Other Ambulatory Visit: Payer: Self-pay

## 2019-04-22 ENCOUNTER — Inpatient Hospital Stay: Payer: BC Managed Care – PPO

## 2019-04-22 ENCOUNTER — Inpatient Hospital Stay: Payer: BC Managed Care – PPO | Attending: Oncology

## 2019-04-22 VITALS — BP 124/88 | HR 95 | Temp 98.7°F | Resp 18

## 2019-04-22 DIAGNOSIS — C50411 Malignant neoplasm of upper-outer quadrant of right female breast: Secondary | ICD-10-CM

## 2019-04-22 DIAGNOSIS — Z170 Estrogen receptor positive status [ER+]: Secondary | ICD-10-CM | POA: Diagnosis not present

## 2019-04-22 DIAGNOSIS — M8580 Other specified disorders of bone density and structure, unspecified site: Secondary | ICD-10-CM

## 2019-04-22 DIAGNOSIS — Z5111 Encounter for antineoplastic chemotherapy: Secondary | ICD-10-CM | POA: Insufficient documentation

## 2019-04-22 LAB — CMP (CANCER CENTER ONLY)
ALT: 29 U/L (ref 0–44)
AST: 20 U/L (ref 15–41)
Albumin: 3.9 g/dL (ref 3.5–5.0)
Alkaline Phosphatase: 113 U/L (ref 38–126)
Anion gap: 9 (ref 5–15)
BUN: 11 mg/dL (ref 6–20)
CO2: 26 mmol/L (ref 22–32)
Calcium: 9.5 mg/dL (ref 8.9–10.3)
Chloride: 105 mmol/L (ref 98–111)
Creatinine: 0.79 mg/dL (ref 0.44–1.00)
GFR, Est AFR Am: >60 mL/min (ref >60)
GFR, Estimated: >60 mL/min (ref >60)
Glucose, Bld: 95 mg/dL (ref 70–99)
Potassium: 4.3 mmol/L (ref 3.5–5.1)
Sodium: 140 mmol/L (ref 135–145)
Total Bilirubin: 0.3 mg/dL (ref 0.3–1.2)
Total Protein: 7.6 g/dL (ref 6.5–8.1)

## 2019-04-22 LAB — CBC WITH DIFFERENTIAL (CANCER CENTER ONLY)
Abs Immature Granulocytes: 0.02 10*3/uL (ref 0.00–0.07)
Basophils Absolute: 0.1 10*3/uL (ref 0.0–0.1)
Basophils Relative: 1 %
Eosinophils Absolute: 0.3 10*3/uL (ref 0.0–0.5)
Eosinophils Relative: 6 %
HCT: 43.8 % (ref 36.0–46.0)
Hemoglobin: 14.3 g/dL (ref 12.0–15.0)
Immature Granulocytes: 0 %
Lymphocytes Relative: 31 %
Lymphs Abs: 1.7 10*3/uL (ref 0.7–4.0)
MCH: 30.2 pg (ref 26.0–34.0)
MCHC: 32.6 g/dL (ref 30.0–36.0)
MCV: 92.6 fL (ref 80.0–100.0)
Monocytes Absolute: 0.5 10*3/uL (ref 0.1–1.0)
Monocytes Relative: 8 %
Neutro Abs: 2.9 10*3/uL (ref 1.7–7.7)
Neutrophils Relative %: 54 %
Platelet Count: 361 10*3/uL (ref 150–400)
RBC: 4.73 MIL/uL (ref 3.87–5.11)
RDW: 13 % (ref 11.5–15.5)
WBC Count: 5.4 10*3/uL (ref 4.0–10.5)
nRBC: 0 % (ref 0.0–0.2)

## 2019-04-22 MED ORDER — FULVESTRANT 250 MG/5ML IM SOLN
500.0000 mg | INTRAMUSCULAR | Status: DC
  Administered 2019-04-22: 500 mg via INTRAMUSCULAR

## 2019-04-22 MED ORDER — FULVESTRANT 250 MG/5ML IM SOLN
INTRAMUSCULAR | Status: AC
  Filled 2019-04-22: qty 10

## 2019-05-19 NOTE — Progress Notes (Signed)
Elizabeth Miles  Telephone:([phone]) [phone] Fax:([phone]) [phone]    ID: SHONNA DEITER DOB: 08/29/1965  MR#: [id number]  HLK#:[id number]  Patient Care Team: Clinton Quant, MD as PCP - General (Internal Medicine) Pomposini, Cherly Anderson, MD as Referring Physician (Internal Medicine) Rolm Bookbinder, MD as Consulting Physician (General Surgery) Kiona Blume, Virgie Dad, MD as Consulting Physician (Oncology) Delice Bison, Charlestine Massed, NP as Nurse Practitioner (Hematology and Oncology) Pieter Partridge, DO as Consulting Physician (Neurology) Mansouraty, Telford Nab., MD as Consulting Physician (Gastroenterology) GYN: Servando Salina MD   CHIEF COMPLAINT: estrogen receptor positive breast cancer  CURRENT TREATMENT: Completing 5 years of antiestrogen therapy  INTERVAL HISTORY: Elizabeth Miles returns today for follow-up and treatment of her estrogen receptor positive ductal carcinoma.   She continues on fulvestrant.  She has tolerated this remarkably well and she completes 5 years of treatment today.  Her last bilateral diagnostic mammography with tomography at [facility] on 04/09/2018 showing: breast density category B; no evidence of malignancy in either breast.  She is overdue for repeat   REVIEW OF SYSTEMS:  Elizabeth Miles is still in process of divorce.  She now however has approximately 30 chickens in her home as well as a parakeet and some cats.  This keeps her pretty busy.  She is not exercising regularly although she does have a treadmill.  She still has left hip and left knee pain.  She wonders if increasing the gabapentin to 600 3 times daily might make a difference.  She is afraid to receive the COVID-19 vaccines at this point   BREAST CANCER HISTORY: From Dr. Dana Allan earlier notes:  "Elizabeth Miles is a 54 y.o. female. Without significant past medical history who on June 2013 had a mammogram that was normal. But there was on physical exam possibility of a cyst noted in the right breast. The  mammogram was negative. In 2014 June patient noted on exam another lump in the right breast. She underwent a diagnostic mammogram on June 10 that showed a right breast nodule in the outer quadrant. She had an ultrasound performed that showed at the 9:30 o'clock position a 3.6 cm area and then added 10:00 position 1.3 cm area with a total area being anywhere between 5-6 cm. The patient went on to have a right breast biopsy performed in [place]. The pathology revealed an invasive ductal carcinoma. This is been confirmed by our pathology as well. The carcinoma and papillary features and was felt to be between a grade 1 and 2. The tumor was estrogen receptor positive strongly (100%) progesterone receptor negative HER-2/neu negative with a Ki-67 that showed a high proliferation rate. Patient is now seen in medical oncology for discussion of treatment options."  Bilateral breast MRI on 08/27/2012 revealed in the right upper quadrant irregular lobulated mass with a satellite nodule or lobulation within 2 mm at its superior aspect, measured together as 4.5 x 4.0 x 3.8 cm. There was extension of enhancement to the nipple, suggesting nipple involvement may be present. No lymphadenopathy was noted there was no any other area of abnormal enhancement in either breast (clinical stage IIA, T2 N0).  PET scan performed on 08/29/2012 revealed the primary breast cancer measuring 3.2 x 3.3 cm with SUV of 16. It was adjacent nodule along the superiomedial border of the primary mass measuring 1.4 cm which was also hypermetabolic. There were no additional areas of abnormal hypermetabolism. No abnormal hypermetabolic activity was seen in the chest, abdomen/pelvis,within the liver, pancreas, adrenal glands or spleen. No  hypermetabolic lymph nodes. In the skeleton, no focal hypermetabolic activity to suggest skeletal metastasis was seen.  Completed neoadjuvant chemotherapy consisting of Q14 day Adriamycin/Cytoxan x 4 cycles on  10/25/2012, followed by one dose of neoadjuvant Taxol on 11/08/2012. She developed significant grade 2 neuropathy and Taxol was discontinued. Then received neoadjuvant chemotherapy consisting of single agent Abraxane given on day 1, 8, 15 of each 28 day cycle. She completed therapy on 11/15/12 - 02/14/13.   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 Repeat HER-2 testing was now positive. The patient was started on tamoxifen March 2015 and on Herceptin the same month.  Her subsequent history is as detailed below.   PAST MEDICAL HISTORY: Past Medical History:  Diagnosis Date  . Allergy   . Anemia   . Anxiety   . Breast cancer ([place]) 08/06/12   invasive ductal carcioma  . Complication of anesthesia    1986 ; problem waking up  . Depression   . GERD (gastroesophageal reflux disease)   . GERD (gastroesophageal reflux disease) 08/22/2012  . Wears glasses     PAST SURGICAL HISTORY: Past Surgical History:  Procedure Laterality Date  . APPENDECTOMY    . BREAST BIOPSY Right 08/06/12  . BREAST LUMPECTOMY WITH NEEDLE LOCALIZATION AND AXILLARY SENTINEL LYMPH NODE BX Right 03/10/2013   Procedure: BREAST LUMPECTOMY WITH NEEDLE LOCALIZATION AND AXILLARY SENTINEL LYMPH NODE BIOPSY;  Surgeon: Rolm Bookbinder, MD;  Location: [facility];  Service: General;  Laterality: Right;  . [place]  . CHOLECYSTECTOMY    . DILATION AND CURETTAGE OF UTERUS  1993  . POPLITEAL SYNOVIAL CYST EXCISION  1970  . PORT-A-CATH REMOVAL Left 03/10/2013   Procedure: REMOVAL PORT-A-CATH;  Surgeon: Rolm Bookbinder, MD;  Location: [facility];  Service: General;  Laterality: Left;  . PORTACATH PLACEMENT Left 09/12/2012   Procedure: INSERTION PORT-A-CATH;  Surgeon: Rolm Bookbinder, MD;  Location: [facility] OR;  Service: General;   Laterality: Left;    FAMILY HISTORY Family History  Problem Relation Age of Onset  . Hypertension Mother   . Uterine cancer Mother   . Heart disease Mother   . Bladder Cancer Maternal Uncle 68  . Cancer Paternal Grandmother 90       lung cancer  . Lung cancer Paternal Grandfather        dx in his 88s  . Prostate cancer Father   . COPD Paternal Uncle   . Colon cancer Neg Hx   . Esophageal cancer Neg Hx   . Stomach cancer Neg Hx   . Rectal cancer Neg Hx    The patient's parents are living and in good health. The patient has one brother, no sisters. There is no history of breast or ovarian cancer in the family to her knowledge   GYNECOLOGIC HISTORY:  Patient's last menstrual period was 03/12/2012. Menarche age 9, first live birth age 89. The patient is GX P3. 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 She is separated from her husband, Elizabeth Miles, who is a Tour manager.  They are in process of divorce.  Elizabeth Miles, Elizabeth Miles, lives in [place] and Elizabeth Miles has a baby girl, the patient's first grandchild, 50 years old as  of December 2020; she stays with Elizabeth Miles frequently. Thelda's son, Elizabeth Miles "Carlynn Spry" is 44 as of December 2020 and lives at home. The patient is a Psychologist, forensic.    ADVANCED DIRECTIVES: Cherrell was given the appropriate forms on 05/21/2018 to fill out and return at their own discretion.     HEALTH MAINTENANCE: Social History   Tobacco Use  . Smoking status: Never Smoker  . Smokeless tobacco: Never Used  Substance Use Topics  . Alcohol use: No  . Drug use: No     Colonoscopy:  PAP:  Bone density:  Lipid panel:  Allergies  Allergen Reactions  . Anastrozole Anaphylaxis  . Contrast Media [Iodinated Diagnostic Agents] Shortness Of Breath    Difficulty breathing with chills on  08/29/2012  . Darvocet [Propoxyphene N-Acetaminophen] Shortness Of Breath and Swelling  . Propoxyphene Anaphylaxis, Shortness Of Breath and Swelling  . Shellfish Allergy Shortness Of Breath and Swelling  . Other     CT dye, "chest hurt" "makes me feel cold"  . Tape     petechiae/redness    Current Outpatient Medications  Medication Sig Dispense Refill  . acetaminophen (TYLENOL) 500 MG tablet Take 1,000 mg by mouth every 6 (six) hours as needed. Reported on 06/14/2015    . Ascorbic Acid (VITAMIN C) 1000 MG tablet Take 1,000 mg by mouth daily.    Marland Kitchen aspirin EC 81 MG tablet Take by mouth.    . cetirizine (ZYRTEC) 10 MG chewable tablet Chew 10 mg by mouth daily.    . Cholecalciferol (VITAMIN D3) 25 MCG (1000 UT) CAPS TAKE 1 CAPSULE BY MOUTH ONCE DAILY. 90 capsule 3  . Dexlansoprazole (DEXILANT) 30 MG capsule Take 1 capsule (30 mg total) by mouth daily. 90 capsule 4  . gabapentin (NEURONTIN) 300 MG capsule Take 1-2 capsules (300-600 mg total) by mouth 3 (three) times daily as needed. 90 capsule 0  . ibuprofen (ADVIL,MOTRIN) 800 MG tablet Take 800 mg by mouth every 8 (eight) hours as needed for moderate pain (for back pain). Reported on 06/14/2015    . polyethylene glycol (MIRALAX / GLYCOLAX) packet Take 17 g by mouth daily.    . predniSONE (DELTASONE) 50 MG tablet Take 50 mg 13, 7, and 1 hours before CT contrast injection 3 tablet 0  . venlafaxine XR (EFFEXOR-XR) 150 MG 24 hr capsule TAKE 1 CAPSULE (150 MG TOTAL) BY MOUTH DAILY WITH BREAKFAST. 90 capsule 4   No current facility-administered medications for this visit.    OBJECTIVE: Middle-aged white woman who appears younger than stated age  41:   05/20/19 0939  BP: (!) 152/75  Pulse: 90  Resp: 18  Temp: 98.5 F (36.9 C)  SpO2: 99%   Wt Readings from Last 3 Encounters:  05/20/19 236 lb 4.8 oz (107.2 kg)  12/03/18 229 lb 4.8 oz (104 kg)  08/29/18 235 lb (106.6 kg)   Body mass index is 38.14 kg/m.    ECOG FS:1 - Symptomatic but  completely ambulatory  Sclerae unicteric, EOMs intact Wearing a mask No cervical or supraclavicular adenopathy Lungs no rales or rhonchi Heart regular rate and rhythm Abd soft, nontender, positive bowel sounds MSK no focal spinal tenderness, no upper extremity lymphedema Neuro: nonfocal, well oriented, appropriate affect Breasts: The right breast is status post lumpectomy and radiation.  There is no evidence of disease recurrence.  The left breast is benign.  Both axillae are benign.  LAB RESULTS:  CMP     Component Value Date/Time   NA 140 04/22/2019 1000  NA 139 03/01/2017 1021   K 4.3 04/22/2019 1000   K 4.5 03/01/2017 1021   CL 105 04/22/2019 1000   CO2 26 04/22/2019 1000   CO2 27 03/01/2017 1021   GLUCOSE 95 04/22/2019 1000   GLUCOSE 92 03/01/2017 1021   BUN 11 04/22/2019 1000   BUN 24.0 03/01/2017 1021   CREATININE 0.79 04/22/2019 1000   CREATININE 0.8 03/01/2017 1021   CALCIUM 9.5 04/22/2019 1000   CALCIUM 9.8 03/01/2017 1021   PROT 7.6 04/22/2019 1000   PROT 7.6 03/01/2017 1021   ALBUMIN 3.9 04/22/2019 1000   ALBUMIN 4.1 03/01/2017 1021   AST 20 04/22/2019 1000   AST 13 03/01/2017 1021   ALT 29 04/22/2019 1000   ALT 17 03/01/2017 1021   ALKPHOS 113 04/22/2019 1000   ALKPHOS 94 03/01/2017 1021   BILITOT 0.3 04/22/2019 1000   BILITOT 0.45 03/01/2017 1021   GFRNONAA >60 04/22/2019 1000   GFRAA >60 04/22/2019 1000    I No results found for: SPEP  Lab Results  Component Value Date   WBC 5.8 05/20/2019   NEUTROABS 3.4 05/20/2019   HGB 13.5 05/20/2019   HCT 41.5 05/20/2019   MCV 91.2 05/20/2019   PLT 328 05/20/2019      Chemistry      Component Value Date/Time   NA 140 04/22/2019 1000   NA 139 03/01/2017 1021   K 4.3 04/22/2019 1000   K 4.5 03/01/2017 1021   CL 105 04/22/2019 1000   CO2 26 04/22/2019 1000   CO2 27 03/01/2017 1021   BUN 11 04/22/2019 1000   BUN 24.0 03/01/2017 1021   CREATININE 0.79 04/22/2019 1000   CREATININE 0.8 03/01/2017  1021      Component Value Date/Time   CALCIUM 9.5 04/22/2019 1000   CALCIUM 9.8 03/01/2017 1021   ALKPHOS 113 04/22/2019 1000   ALKPHOS 94 03/01/2017 1021   AST 20 04/22/2019 1000   AST 13 03/01/2017 1021   ALT 29 04/22/2019 1000   ALT 17 03/01/2017 1021   BILITOT 0.3 04/22/2019 1000   BILITOT 0.45 03/01/2017 1021       No results found for: LABCA2  No components found for: LABCA125  No results for input(s): INR in the last 168 hours.  Urinalysis    Component Value Date/Time   LABSPEC 1.020 09/28/2015 1224   PHURINE 5.0 09/28/2015 1224   GLUCOSEU Negative 09/28/2015 1224   HGBUR Negative 09/28/2015 1224   BILIRUBINUR Negative 09/28/2015 1224   KETONESUR Negative 09/28/2015 1224   PROTEINUR Negative 09/28/2015 1224   UROBILINOGEN 0.2 09/28/2015 1224   NITRITE Negative 09/28/2015 1224   LEUKOCYTESUR Negative 09/28/2015 1224    STUDIES:  No results found.   ASSESSMENT: 54 y.o. [place] woman  (1) status post right breast upper outer quadrant biopsy 08/06/2012 for a clinical mT2 N0, stage IIA invasive ductal carcinoma, estrogen receptor 3+ positive, progesterone receptor and HER-2 negative, Ki67 3+ [[id number]]  (2) treated neoadjuvantly with dose dense cyclophosphamide and doxorubicin x4, completed 10/25/2012, followed by a single dose of weekly paclitaxel, poorly tolerated; followed by 11 doses of Abraxane completed 02/14/2013  (3) status post right lumpectomy and sentinel lymph node sampling 03/10/2013 for a ypT2 pN0, stage IIA invasive ductal carcinoma, grade 2, estrogen receptor 100% positive, progesterone receptor 21% positive, with an MIB-1 of 14%, and HER-2 amplified, the signals ratio being 2.52, the number per cell 2.90  (a) reclassified as stage IB in the 2018 prognostic classification  (4) adjuvant  radiation completed March 2015 (?)  (5) started tamoxifen March 2015, stopped 05/25/14 because of superficial clot to left cephalic vein; began  anastrozole on 05/29/14, discontinued after 2 weeks with rash; started letrozole 05/24/2015, discontinued after 5 days use because of angioedema-like symptoms  (6) started trastuzumab 05/23/2013, completed 05/15/14  (7) osteopenia: With T score of -1.7 on bone density scan at [facility] 06/10/2014.   (a) with dental clearance started denosumab/Prolia 12/09/2015  (b) denosumab/Prolia discontinued after October 2017 dose at patient's request  (8) fulvestrant started 06/23/2015, completing 5 years of antiestrogen 05/20/2019   PLAN: Loriana is now a little over 6 years out from definitive surgery for her breast cancer with no evidence of disease recurrence.  This is very favorable.  She completes her fulvestrant today.  She has tolerated this well.  She understands continuing antiestrogens beyond this point would only minimally affect her prognosis.  On the other hand by having taken antiestrogens for 5 years she has cut in half her risk of breast cancer recurrence.  At this point I am comfortable releasing her from follow-up.  She is very interested in participating in our survivorship program and I have made her an appointment with our survivorship nurse practitioner for June.  Nalaysia is behind on mammography and I put in that order for her.  She wanted to increase the gabapentin to 600 3 times daily and we are going to give that a trial over the next month.  I did not give her any refills.  I have suggested that she move her gabapentin and venlafaxine to her primary care physician since she is really "getting out of the cancer business" at this point.  She knows to call for any other issue that may develop before the next visit.  Total encounter time 30 minutes.*   Analysia Dungee, Virgie Dad, MD  05/20/19 10:12 AM Medical Oncology and Hematology [facility] [place], [place] [address] Tel. [phone]    Fax. [phone]   I, Wilburn Mylar, am acting as scribe for Dr.  Virgie Dad. Lola Czerwonka.  I, Lurline Del MD, have reviewed the above documentation for accuracy and completeness, and I agree with the above.    *Total Encounter Time as defined by the Centers for Medicare and Medicaid Services includes, in addition to the face-to-face time of a patient visit (documented in the note above) non-face-to-face time: obtaining and reviewing outside history, ordering and reviewing medications, tests or procedures, care coordination (communications with other health care professionals or caregivers) and documentation in the medical record.

## 2019-05-20 ENCOUNTER — Other Ambulatory Visit: Payer: Self-pay

## 2019-05-20 ENCOUNTER — Encounter: Payer: Self-pay | Admitting: *Deleted

## 2019-05-20 ENCOUNTER — Inpatient Hospital Stay (HOSPITAL_BASED_OUTPATIENT_CLINIC_OR_DEPARTMENT_OTHER): Payer: BC Managed Care – PPO | Admitting: Oncology

## 2019-05-20 ENCOUNTER — Other Ambulatory Visit: Payer: Self-pay | Admitting: *Deleted

## 2019-05-20 ENCOUNTER — Inpatient Hospital Stay: Payer: BC Managed Care – PPO | Attending: Oncology

## 2019-05-20 ENCOUNTER — Inpatient Hospital Stay: Payer: BC Managed Care – PPO

## 2019-05-20 VITALS — BP 152/75 | HR 90 | Temp 98.5°F | Resp 18 | Ht 66.0 in | Wt 236.3 lb

## 2019-05-20 DIAGNOSIS — Z79899 Other long term (current) drug therapy: Secondary | ICD-10-CM | POA: Insufficient documentation

## 2019-05-20 DIAGNOSIS — Z791 Long term (current) use of non-steroidal anti-inflammatories (NSAID): Secondary | ICD-10-CM | POA: Insufficient documentation

## 2019-05-20 DIAGNOSIS — Z95828 Presence of other vascular implants and grafts: Secondary | ICD-10-CM

## 2019-05-20 DIAGNOSIS — Z801 Family history of malignant neoplasm of trachea, bronchus and lung: Secondary | ICD-10-CM | POA: Insufficient documentation

## 2019-05-20 DIAGNOSIS — C50411 Malignant neoplasm of upper-outer quadrant of right female breast: Secondary | ICD-10-CM | POA: Insufficient documentation

## 2019-05-20 DIAGNOSIS — Z7982 Long term (current) use of aspirin: Secondary | ICD-10-CM | POA: Insufficient documentation

## 2019-05-20 DIAGNOSIS — Z170 Estrogen receptor positive status [ER+]: Secondary | ICD-10-CM

## 2019-05-20 DIAGNOSIS — Z8042 Family history of malignant neoplasm of prostate: Secondary | ICD-10-CM | POA: Diagnosis not present

## 2019-05-20 DIAGNOSIS — F419 Anxiety disorder, unspecified: Secondary | ICD-10-CM | POA: Diagnosis not present

## 2019-05-20 DIAGNOSIS — M8580 Other specified disorders of bone density and structure, unspecified site: Secondary | ICD-10-CM | POA: Diagnosis not present

## 2019-05-20 DIAGNOSIS — F329 Major depressive disorder, single episode, unspecified: Secondary | ICD-10-CM | POA: Insufficient documentation

## 2019-05-20 DIAGNOSIS — Z8049 Family history of malignant neoplasm of other genital organs: Secondary | ICD-10-CM | POA: Insufficient documentation

## 2019-05-20 DIAGNOSIS — Z7952 Long term (current) use of systemic steroids: Secondary | ICD-10-CM | POA: Insufficient documentation

## 2019-05-20 DIAGNOSIS — M818 Other osteoporosis without current pathological fracture: Secondary | ICD-10-CM

## 2019-05-20 DIAGNOSIS — Z923 Personal history of irradiation: Secondary | ICD-10-CM | POA: Insufficient documentation

## 2019-05-20 DIAGNOSIS — Z8249 Family history of ischemic heart disease and other diseases of the circulatory system: Secondary | ICD-10-CM | POA: Insufficient documentation

## 2019-05-20 DIAGNOSIS — Z8052 Family history of malignant neoplasm of bladder: Secondary | ICD-10-CM | POA: Insufficient documentation

## 2019-05-20 LAB — CBC WITH DIFFERENTIAL (CANCER CENTER ONLY)
Abs Immature Granulocytes: 0.02 10*3/uL (ref 0.00–0.07)
Basophils Absolute: 0.1 10*3/uL (ref 0.0–0.1)
Basophils Relative: 1 %
Eosinophils Absolute: 0.4 10*3/uL (ref 0.0–0.5)
Eosinophils Relative: 7 %
HCT: 41.5 % (ref 36.0–46.0)
Hemoglobin: 13.5 g/dL (ref 12.0–15.0)
Immature Granulocytes: 0 %
Lymphocytes Relative: 25 %
Lymphs Abs: 1.5 10*3/uL (ref 0.7–4.0)
MCH: 29.7 pg (ref 26.0–34.0)
MCHC: 32.5 g/dL (ref 30.0–36.0)
MCV: 91.2 fL (ref 80.0–100.0)
Monocytes Absolute: 0.4 10*3/uL (ref 0.1–1.0)
Monocytes Relative: 7 %
Neutro Abs: 3.4 10*3/uL (ref 1.7–7.7)
Neutrophils Relative %: 60 %
Platelet Count: 328 10*3/uL (ref 150–400)
RBC: 4.55 MIL/uL (ref 3.87–5.11)
RDW: 12.9 % (ref 11.5–15.5)
WBC Count: 5.8 10*3/uL (ref 4.0–10.5)
nRBC: 0 % (ref 0.0–0.2)

## 2019-05-20 LAB — CMP (CANCER CENTER ONLY)
ALT: 31 U/L (ref 0–44)
AST: 21 U/L (ref 15–41)
Albumin: 3.9 g/dL (ref 3.5–5.0)
Alkaline Phosphatase: 112 U/L (ref 38–126)
Anion gap: 11 (ref 5–15)
BUN: 7 mg/dL (ref 6–20)
CO2: 27 mmol/L (ref 22–32)
Calcium: 9.7 mg/dL (ref 8.9–10.3)
Chloride: 104 mmol/L (ref 98–111)
Creatinine: 0.79 mg/dL (ref 0.44–1.00)
GFR, Est AFR Am: >60 mL/min (ref >60)
GFR, Estimated: >60 mL/min (ref >60)
Glucose, Bld: 114 mg/dL — ABNORMAL HIGH (ref 70–99)
Potassium: 4.1 mmol/L (ref 3.5–5.1)
Sodium: 142 mmol/L (ref 135–145)
Total Bilirubin: 0.4 mg/dL (ref 0.3–1.2)
Total Protein: 7.3 g/dL (ref 6.5–8.1)

## 2019-05-20 MED ORDER — SODIUM CHLORIDE 0.9% FLUSH
10.0000 mL | INTRAVENOUS | Status: DC | PRN
  Filled 2019-05-20: qty 10

## 2019-05-20 MED ORDER — HEPARIN SOD (PORK) LOCK FLUSH 100 UNIT/ML IV SOLN
500.0000 [IU] | Freq: Once | INTRAVENOUS | Status: DC
  Filled 2019-05-20: qty 5

## 2019-05-20 MED ORDER — FULVESTRANT 250 MG/5ML IM SOLN
500.0000 mg | INTRAMUSCULAR | Status: DC
  Administered 2019-05-20: 500 mg via INTRAMUSCULAR

## 2019-05-20 MED ORDER — FULVESTRANT 250 MG/5ML IM SOLN
INTRAMUSCULAR | Status: AC
  Filled 2019-05-20: qty 10

## 2019-05-20 MED ORDER — GABAPENTIN 300 MG PO CAPS: 300.0000 mg | ORAL_CAPSULE | Freq: Three times a day (TID) | ORAL | 0 refills | Status: DC | PRN

## 2019-05-20 NOTE — Patient Instructions (Signed)
Fulvestrant injection What is this medicine? FULVESTRANT (ful VES trant) blocks the effects of estrogen. It is used to treat breast cancer. This medicine may be used for other purposes; ask your health care provider or pharmacist if you have questions. COMMON BRAND NAME(S): FASLODEX What should I tell my health care provider before I take this medicine? They need to know if you have any of these conditions:  bleeding disorders  liver disease  low blood counts, like low white cell, platelet, or red cell counts  an unusual or allergic reaction to fulvestrant, other medicines, foods, dyes, or preservatives  pregnant or trying to get pregnant  breast-feeding How should I use this medicine? This medicine is for injection into a muscle. It is usually given by a health care professional in a hospital or clinic setting. Talk to your pediatrician regarding the use of this medicine in children. Special care may be needed. Overdosage: If you think you have taken too much of this medicine contact a poison control center or emergency room at once. NOTE: This medicine is only for you. Do not share this medicine with others. What if I miss a dose? It is important not to miss your dose. Call your doctor or health care professional if you are unable to keep an appointment. What may interact with this medicine?  medicines that treat or prevent blood clots like warfarin, enoxaparin, dalteparin, apixaban, dabigatran, and rivaroxaban This list may not describe all possible interactions. Give your health care provider a list of all the medicines, herbs, non-prescription drugs, or dietary supplements you use. Also tell them if you smoke, drink alcohol, or use illegal drugs. Some items may interact with your medicine. What should I watch for while using this medicine? Your condition will be monitored carefully while you are receiving this medicine. You will need important blood work done while you are taking  this medicine. Do not become pregnant while taking this medicine or for at least 1 year after stopping it. Women of child-bearing potential will need to have a negative pregnancy test before starting this medicine. Women should inform their doctor if they wish to become pregnant or think they might be pregnant. There is a potential for serious side effects to an unborn child. Men should inform their doctors if they wish to father a child. This medicine may lower sperm counts. Talk to your health care professional or pharmacist for more information. Do not breast-feed an infant while taking this medicine or for 1 year after the last dose. What side effects may I notice from receiving this medicine? Side effects that you should report to your doctor or health care professional as soon as possible:  allergic reactions like skin rash, itching or hives, swelling of the face, lips, or tongue  feeling faint or lightheaded, falls  pain, tingling, numbness, or weakness in the legs  signs and symptoms of infection like fever or chills; cough; flu-like symptoms; sore throat  vaginal bleeding Side effects that usually do not require medical attention (report to your doctor or health care professional if they continue or are bothersome):  aches, pains  constipation  diarrhea  headache  hot flashes  nausea, vomiting  pain at site where injected  stomach pain This list may not describe all possible side effects. Call your doctor for medical advice about side effects. You may report side effects to FDA at 1-800-FDA-1088. Where should I keep my medicine? This drug is given in a hospital or clinic and will   not be stored at home. NOTE: This sheet is a summary. It may not cover all possible information. If you have questions about this medicine, talk to your doctor, pharmacist, or health care provider.  2020 [employer] Standard (2017-05-24 11:34:41)  

## 2019-05-21 ENCOUNTER — Telehealth: Payer: Self-pay | Admitting: Oncology

## 2019-05-21 NOTE — Telephone Encounter (Signed)
Scheduled appts per 3/23 los. Pt confirmed new appt date and time. Faxed order to [facility]

## 2019-08-21 ENCOUNTER — Inpatient Hospital Stay: Payer: BC Managed Care – PPO | Admitting: Adult Health

## 2019-08-28 ENCOUNTER — Encounter: Payer: Self-pay | Admitting: Licensed Clinical Social Worker

## 2019-08-28 ENCOUNTER — Inpatient Hospital Stay: Payer: BC Managed Care – PPO | Attending: Adult Health | Admitting: Adult Health

## 2019-08-28 ENCOUNTER — Other Ambulatory Visit: Payer: Self-pay

## 2019-08-28 ENCOUNTER — Telehealth: Payer: Self-pay | Admitting: Adult Health

## 2019-08-28 ENCOUNTER — Encounter: Payer: Self-pay | Admitting: Adult Health

## 2019-08-28 VITALS — BP 163/98 | HR 96 | Temp 97.5°F | Resp 16 | Wt 233.8 lb

## 2019-08-28 DIAGNOSIS — Z170 Estrogen receptor positive status [ER+]: Secondary | ICD-10-CM

## 2019-08-28 DIAGNOSIS — Z8042 Family history of malignant neoplasm of prostate: Secondary | ICD-10-CM | POA: Diagnosis not present

## 2019-08-28 DIAGNOSIS — F329 Major depressive disorder, single episode, unspecified: Secondary | ICD-10-CM | POA: Diagnosis not present

## 2019-08-28 DIAGNOSIS — Z923 Personal history of irradiation: Secondary | ICD-10-CM | POA: Diagnosis not present

## 2019-08-28 DIAGNOSIS — Z8049 Family history of malignant neoplasm of other genital organs: Secondary | ICD-10-CM | POA: Diagnosis not present

## 2019-08-28 DIAGNOSIS — M8580 Other specified disorders of bone density and structure, unspecified site: Secondary | ICD-10-CM | POA: Insufficient documentation

## 2019-08-28 DIAGNOSIS — Z7952 Long term (current) use of systemic steroids: Secondary | ICD-10-CM | POA: Diagnosis not present

## 2019-08-28 DIAGNOSIS — Z801 Family history of malignant neoplasm of trachea, bronchus and lung: Secondary | ICD-10-CM | POA: Insufficient documentation

## 2019-08-28 DIAGNOSIS — Z9221 Personal history of antineoplastic chemotherapy: Secondary | ICD-10-CM | POA: Diagnosis not present

## 2019-08-28 DIAGNOSIS — F419 Anxiety disorder, unspecified: Secondary | ICD-10-CM | POA: Diagnosis not present

## 2019-08-28 DIAGNOSIS — C50411 Malignant neoplasm of upper-outer quadrant of right female breast: Secondary | ICD-10-CM

## 2019-08-28 DIAGNOSIS — Z8249 Family history of ischemic heart disease and other diseases of the circulatory system: Secondary | ICD-10-CM | POA: Insufficient documentation

## 2019-08-28 DIAGNOSIS — Z853 Personal history of malignant neoplasm of breast: Secondary | ICD-10-CM | POA: Diagnosis not present

## 2019-08-28 DIAGNOSIS — Z7982 Long term (current) use of aspirin: Secondary | ICD-10-CM | POA: Insufficient documentation

## 2019-08-28 DIAGNOSIS — Z8052 Family history of malignant neoplasm of bladder: Secondary | ICD-10-CM | POA: Diagnosis not present

## 2019-08-28 DIAGNOSIS — K219 Gastro-esophageal reflux disease without esophagitis: Secondary | ICD-10-CM | POA: Diagnosis not present

## 2019-08-28 DIAGNOSIS — Z791 Long term (current) use of non-steroidal anti-inflammatories (NSAID): Secondary | ICD-10-CM | POA: Insufficient documentation

## 2019-08-28 DIAGNOSIS — Z79899 Other long term (current) drug therapy: Secondary | ICD-10-CM | POA: Insufficient documentation
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
## 2019-08-28 NOTE — Telephone Encounter (Signed)
Scheduled appts per 7/1 los. Pt declined print out and stated she would refer to mychart.

## 2019-08-29 ENCOUNTER — Inpatient Hospital Stay: Payer: BC Managed Care – PPO | Admitting: Adult Health

## 2019-11-07 ENCOUNTER — Other Ambulatory Visit: Payer: Self-pay | Admitting: Oncology

## 2019-11-07 DIAGNOSIS — Z170 Estrogen receptor positive status [ER+]: Secondary | ICD-10-CM

## 2020-04-26 ENCOUNTER — Other Ambulatory Visit: Payer: Self-pay | Admitting: Orthopaedic Surgery

## 2020-04-26 DIAGNOSIS — Z01818 Encounter for other preprocedural examination: Secondary | ICD-10-CM

## 2020-05-20 NOTE — Patient Instructions (Signed)
DUE TO COVID-19 ONLY ONE VISITOR IS ALLOWED TO COME WITH YOU AND STAY IN THE WAITING ROOM ONLY DURING PRE OP AND PROCEDURE DAY OF SURGERY. THE 1 VISITOR  MAY VISIT WITH YOU AFTER SURGERY IN YOUR PRIVATE ROOM DURING VISITING HOURS ONLY!  YOU NEED TO HAVE A COVID 19 TEST ON: 05/21/20 @ 11: 61 AM, THIS TEST MUST BE DONE BEFORE SURGERY,  COVID TESTING SITE [place] [address], IT IS ON THE RIGHT GOING OUT [address] APPROXIMATELY  2 MINUTES PAST [employer] ON THE RIGHT. ONCE YOUR COVID TEST IS COMPLETED,  PLEASE BEGIN THE QUARANTINE INSTRUCTIONS AS OUTLINED IN YOUR HANDOUT.                Elizabeth Miles   Your procedure is scheduled on: 05/25/20   Report to [facility] Main  Entrance   Report to admitting at : 7:30 AM     Call this number if you have problems the morning of surgery [phone]    Remember:  NO SOLID FOOD AFTER MIDNIGHT THE NIGHT PRIOR TO SURGERY. NOTHING BY MOUTH EXCEPT CLEAR LIQUIDS UNTIL: 7:00 AM . PLEASE FINISH ENSURE DRINK PER SURGEON ORDER  WHICH NEEDS TO BE COMPLETED AT: 7:00 AM .  CLEAR LIQUID DIET  Foods Allowed                                                                     Foods Excluded  Coffee and tea, regular and decaf                             liquids that you cannot  Plain Jell-O any favor except red or purple                                           see through such as: Fruit ices (not with fruit pulp)                                     milk, soups, orange juice  Iced Popsicles                                    All solid food Carbonated beverages, regular and diet                                    Cranberry, grape and apple juices Sports drinks like Gatorade Lightly seasoned clear broth or consume(fat free) Sugar, honey syrup  Sample Menu Breakfast                                Lunch  Supper Cranberry juice                    Beef broth                             Chicken broth Jell-O                                     Grape juice                           Apple juice Coffee or tea                        Jell-O                                      Popsicle                                                Coffee or tea                        Coffee or tea  _____________________________________________________________________   BRUSH YOUR TEETH MORNING OF SURGERY AND RINSE YOUR MOUTH OUT, NO CHEWING GUM CANDY OR MINTS.    Take these medicines the morning of surgery with A SIP OF WATER:Dexlansoprazole                                You may not have any metal on your body including hair pins and              piercings  Do not wear jewelry, make-up, lotions, powders or perfumes, deodorant             Do not wear nail polish on your fingernails.  Do not shave  48 hours prior to surgery.    Do not bring valuables to the hospital. [place].  Contacts, dentures or bridgework may not be worn into surgery.  Leave suitcase in the car. After surgery it may be brought to your room.     Patients discharged the day of surgery will not be allowed to drive home. IF YOU ARE HAVING SURGERY AND GOING HOME THE SAME DAY, YOU MUST HAVE AN ADULT TO DRIVE YOU HOME AND BE WITH YOU FOR 24 HOURS. YOU MAY GO HOME BY TAXI OR UBER OR ORTHERWISE, BUT AN ADULT MUST ACCOMPANY YOU HOME AND STAY WITH YOU FOR 24 HOURS.  Name and phone number of your driver:  Special Instructions: N/A              Please read over the following fact sheets you were given: _____________________________________________________________________          [facility] - Preparing for Surgery Before surgery, you can play an important role.  Because skin is not sterile, your skin needs to be as free of germs as possible.  You  can reduce the number of germs on your skin by washing with CHG (chlorahexidine gluconate) soap before surgery.  CHG is an antiseptic  cleaner which kills germs and bonds with the skin to continue killing germs even after washing. Please DO NOT use if you have an allergy to CHG or antibacterial soaps.  If your skin becomes reddened/irritated stop using the CHG and inform your nurse when you arrive at Short Stay. Do not shave (including legs and underarms) for at least 48 hours prior to the first CHG shower.  You may shave your face/neck. Please follow these instructions carefully:  1.  Shower with CHG Soap the night before surgery and the  morning of Surgery.  2.  If you choose to wash your hair, wash your hair first as usual with your  normal  shampoo.  3.  After you shampoo, rinse your hair and body thoroughly to remove the  shampoo.                           4.  Use CHG as you would any other liquid soap.  You can apply chg directly  to the skin and wash                       Gently with a scrungie or clean washcloth.  5.  Apply the CHG Soap to your body ONLY FROM THE NECK DOWN.   Do not use on face/ open                           Wound or open sores. Avoid contact with eyes, ears mouth and genitals (private parts).                       Wash face,  Genitals (private parts) with your normal soap.             6.  Wash thoroughly, paying special attention to the area where your surgery  will be performed.  7.  Thoroughly rinse your body with warm water from the neck down.  8.  DO NOT shower/wash with your normal soap after using and rinsing off  the CHG Soap.                9.  Pat yourself dry with a clean towel.            10.  Wear clean pajamas.            11.  Place clean sheets on your bed the night of your first shower and do not  sleep with pets. Day of Surgery : Do not apply any lotions/deodorants the morning of surgery.  Please wear clean clothes to the hospital/surgery center.  FAILURE TO FOLLOW THESE INSTRUCTIONS MAY RESULT IN THE CANCELLATION OF YOUR SURGERY PATIENT  SIGNATURE_________________________________  NURSE SIGNATURE__________________________________  ________________________________________________________________________   Elizabeth Miles  An incentive spirometer is a tool that can help keep your lungs clear and active. This tool measures how well you are filling your lungs with each breath. Taking long deep breaths may help reverse or decrease the chance of developing breathing (pulmonary) problems (especially infection) following:  A long period of time when you are unable to move or be active. BEFORE THE PROCEDURE   If the spirometer includes an indicator to show your best effort, your nurse or respiratory therapist will set  it to a desired goal.  If possible, sit up straight or lean slightly forward. Try not to slouch.  Hold the incentive spirometer in an upright position. INSTRUCTIONS FOR USE  1. Sit on the edge of your bed if possible, or sit up as far as you can in bed or on a chair. 2. Hold the incentive spirometer in an upright position. 3. Breathe out normally. 4. Place the mouthpiece in your mouth and seal your lips tightly around it. 5. Breathe in slowly and as deeply as possible, raising the piston or the ball toward the top of the column. 6. Hold your breath for 3-5 seconds or for as long as possible. Allow the piston or ball to fall to the bottom of the column. 7. Remove the mouthpiece from your mouth and breathe out normally. 8. Rest for a few seconds and repeat Steps 1 through 7 at least 10 times every 1-2 hours when you are awake. Take your time and take a few normal breaths between deep breaths. 9. The spirometer may include an indicator to show your best effort. Use the indicator as a goal to work toward during each repetition. 10. After each set of 10 deep breaths, practice coughing to be sure your lungs are clear. If you have an incision (the cut made at the time of surgery), support your incision when coughing  by placing a pillow or rolled up towels firmly against it. Once you are able to get out of bed, walk around indoors and cough well. You may stop using the incentive spirometer when instructed by your caregiver.  RISKS AND COMPLICATIONS  Take your time so you do not get dizzy or light-headed.  If you are in pain, you may need to take or ask for pain medication before doing incentive spirometry. It is harder to take a deep breath if you are having pain. AFTER USE  Rest and breathe slowly and easily.  It can be helpful to keep track of a log of your progress. Your caregiver can provide you with a simple table to help with this. If you are using the spirometer at home, follow these instructions: [place] IF:   You are having difficultly using the spirometer.  You have trouble using the spirometer as often as instructed.  Your pain medication is not giving enough relief while using the spirometer.  You develop fever of 100.5 F (38.1 C) or higher. SEEK IMMEDIATE MEDICAL CARE IF:   You cough up bloody sputum that had not been present before.  You develop fever of 102 F (38.9 C) or greater.  You develop worsening pain at or near the incision site. MAKE SURE YOU:   Understand these instructions.  Will watch your condition.  Will get help right away if you are not doing well or get worse. Document Released: 06/26/2006 Document Revised: 05/08/2011 Document Reviewed: 08/27/2006 [facility] Patient Information 2014 [place], [place].   ________________________________________________________________________

## 2020-05-21 ENCOUNTER — Other Ambulatory Visit (HOSPITAL_COMMUNITY)
Admission: RE | Admit: 2020-05-21 | Discharge: 2020-05-21 | Disposition: A | Discharge status: Home / Self Care | Payer: BC Managed Care – PPO | Source: Ambulatory Visit | Attending: Orthopaedic Surgery | Admitting: Orthopaedic Surgery

## 2020-05-21 ENCOUNTER — Encounter (HOSPITAL_COMMUNITY): Payer: Self-pay

## 2020-05-21 ENCOUNTER — Other Ambulatory Visit: Payer: Self-pay

## 2020-05-21 ENCOUNTER — Encounter (HOSPITAL_COMMUNITY)
Admission: RE | Admit: 2020-05-21 | Discharge: 2020-05-21 | Disposition: A | Discharge status: Home / Self Care | Payer: BC Managed Care – PPO | Source: Ambulatory Visit | Attending: Orthopaedic Surgery | Admitting: Orthopaedic Surgery

## 2020-05-21 DIAGNOSIS — Z01812 Encounter for preprocedural laboratory examination: Secondary | ICD-10-CM | POA: Diagnosis not present

## 2020-05-21 DIAGNOSIS — Z20822 Contact with and (suspected) exposure to covid-19: Secondary | ICD-10-CM | POA: Insufficient documentation

## 2020-05-21 DIAGNOSIS — Z01818 Encounter for other preprocedural examination: Secondary | ICD-10-CM

## 2020-05-21 LAB — SARS CORONAVIRUS 2 (TAT 6-24 HRS): SARS Coronavirus 2: NEGATIVE

## 2020-05-21 LAB — PROTIME-INR
INR: 1.1 (ref 0.8–1.2)
Prothrombin Time: 13.5 seconds (ref 11.4–15.2)

## 2020-05-21 LAB — APTT: aPTT: 26 seconds (ref 24–36)

## 2020-05-21 LAB — CBC WITH DIFFERENTIAL/PLATELET
Abs Immature Granulocytes: 0.01 10*3/uL (ref 0.00–0.07)
Basophils Absolute: 0.1 10*3/uL (ref 0.0–0.1)
Basophils Relative: 1 %
Eosinophils Absolute: 0.2 10*3/uL (ref 0.0–0.5)
Eosinophils Relative: 3 %
HCT: 42.1 % (ref 36.0–46.0)
Hemoglobin: 13.7 g/dL (ref 12.0–15.0)
Immature Granulocytes: 0 %
Lymphocytes Relative: 34 %
Lymphs Abs: 1.8 10*3/uL (ref 0.7–4.0)
MCH: 30 pg (ref 26.0–34.0)
MCHC: 32.5 g/dL (ref 30.0–36.0)
MCV: 92.1 fL (ref 80.0–100.0)
Monocytes Absolute: 0.4 10*3/uL (ref 0.1–1.0)
Monocytes Relative: 7 %
Neutro Abs: 2.8 10*3/uL (ref 1.7–7.7)
Neutrophils Relative %: 55 %
Platelets: 351 10*3/uL (ref 150–400)
RBC: 4.57 MIL/uL (ref 3.87–5.11)
RDW: 12.7 % (ref 11.5–15.5)
WBC: 5.2 10*3/uL (ref 4.0–10.5)
nRBC: 0 % (ref 0.0–0.2)

## 2020-05-21 LAB — URINALYSIS, ROUTINE W REFLEX MICROSCOPIC
Bilirubin Urine: NEGATIVE
Glucose, UA: NEGATIVE mg/dL
Hgb urine dipstick: NEGATIVE
Ketones, ur: NEGATIVE mg/dL
Leukocytes,Ua: NEGATIVE
Nitrite: NEGATIVE
Protein, ur: NEGATIVE mg/dL
Specific Gravity, Urine: 1.025 (ref 1.005–1.030)
pH: 5 (ref 5.0–8.0)

## 2020-05-21 LAB — BASIC METABOLIC PANEL
Anion gap: 9 (ref 5–15)
BUN: 12 mg/dL (ref 6–20)
CO2: 26 mmol/L (ref 22–32)
Calcium: 9.6 mg/dL (ref 8.9–10.3)
Chloride: 107 mmol/L (ref 98–111)
Creatinine, Ser: 0.75 mg/dL (ref 0.44–1.00)
GFR, Estimated: >60 mL/min (ref >60)
Glucose, Bld: 108 mg/dL — ABNORMAL HIGH (ref 70–99)
Potassium: 4.4 mmol/L (ref 3.5–5.1)
Sodium: 142 mmol/L (ref 135–145)

## 2020-05-21 LAB — SURGICAL PCR SCREEN
MRSA, PCR: NEGATIVE
Staphylococcus aureus: NEGATIVE

## 2020-05-21 NOTE — H&P (Signed)
TOTAL HIP ADMISSION H&P  Patient is admitted for right total hip arthroplasty.  Subjective:  Chief Complaint: right hip pain  HPI: Elizabeth Miles, 55 y.o. female, has a history of pain and functional disability in the right hip(s) due to arthritis and patient has failed non-surgical conservative treatments for greater than 12 weeks to include NSAID's and/or analgesics, corticosteriod injections, flexibility and strengthening excercises, use of assistive devices, weight reduction as appropriate and activity modification.  Onset of symptoms was gradual starting 5 years ago with gradually worsening course since that time.The patient noted no past surgery on the right hip(s).  Patient currently rates pain in the right hip at 10 out of 10 with activity. Patient has night pain, worsening of pain with activity and weight bearing, trendelenberg gait, pain that interfers with activities of daily living, crepitus and joint swelling. Patient has evidence of subchondral cysts, subchondral sclerosis, periarticular osteophytes and joint space narrowing by imaging studies. This condition presents safety issues increasing the risk of falls.  There is no current active infection.  Patient Active Problem List   Diagnosis Date Noted  . Encounter for routine adult health examination 05/21/2017  . Hx of colonoscopy 05/21/2017  . Mild recurrent major depression ([place]) 05/21/2017  . Osteoporosis 05/21/2017  . Seasonal allergic rhinitis 05/21/2017  . Acute appendicitis with localized peritonitis, without perforation, abscess, or gangrene 01/15/2017  . Hyperlipidemia 05/12/2016  . Knee pain, left 09/29/2015  . Urinary frequency 09/29/2015  . Headache 05/12/2015  . Osteopenia determined by x-ray 07/06/2014  . Anxiety 05/30/2014  . Superficial vein thrombosis 05/27/2014  . Joint stiffness 12/19/2013  . Left facial numbness 11/07/2013  . Malignant neoplasm of upper-outer quadrant of right breast in female, estrogen  receptor positive ([place]) 09/27/2013  . Obesity (BMI 30-39.9) 09/05/2013  . Severe obesity (BMI >= 40) ([place]) 09/05/2013  . GERD (gastroesophageal reflux disease) 08/22/2012   Past Medical History:  Diagnosis Date  . Allergy   . Anemia   . Anxiety   . 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 Complication of anesthesia    1986 ; problem waking up  . Depression   . GERD (gastroesophageal reflux disease)   . GERD (gastroesophageal reflux disease) 08/22/2012  . Wears glasses     Past Surgical History:  Procedure Laterality Date  . APPENDECTOMY    . BREAST BIOPSY Right 08/06/12  . BREAST LUMPECTOMY WITH NEEDLE LOCALIZATION AND AXILLARY SENTINEL LYMPH NODE BX Right 03/10/2013   Procedure: BREAST LUMPECTOMY WITH NEEDLE LOCALIZATION AND AXILLARY SENTINEL LYMPH NODE BIOPSY;  Surgeon: Rolm Bookbinder, MD;  Location: [place];  Service: General;  Laterality: Right;  . [place]  . CHOLECYSTECTOMY    . DILATION AND CURETTAGE OF UTERUS  1993  . POPLITEAL SYNOVIAL CYST EXCISION  1970  . PORT-A-CATH REMOVAL Left 03/10/2013   Procedure: REMOVAL PORT-A-CATH;  Surgeon: Rolm Bookbinder, MD;  Location: [place];  Service: General;  Laterality: Left;  . PORTACATH PLACEMENT Left 09/12/2012   Procedure: INSERTION PORT-A-CATH;  Surgeon: Rolm Bookbinder, MD;  Location: [place];  Service: General;  Laterality: Left;    No current facility-administered medications for this encounter.   Current Outpatient Medications  Medication Sig Dispense Refill Last Dose  . acetaminophen (TYLENOL) 500 MG tablet Take 1,000 mg by mouth every 6 (six) hours as needed for moderate pain. Reported on 06/14/2015     . aspirin EC 81 MG tablet Take 81 mg by mouth  daily.     . cetirizine (ZYRTEC) 10 MG chewable tablet Chew 10 mg by mouth at bedtime.     . Cholecalciferol (VITAMIN D3) 25 MCG (1000 UT) CAPS TAKE 1 CAPSULE BY MOUTH ONCE DAILY. (Patient taking  differently: Take 1,000 Units by mouth daily.) 90 capsule 3   . Dexlansoprazole (DEXILANT) 30 MG capsule Take 1 capsule (30 mg total) by mouth daily. 90 capsule 4   . ibuprofen (ADVIL) 200 MG tablet Take 400 mg by mouth every 8 (eight) hours as needed for moderate pain (for back pain). Reported on 06/14/2015     . venlafaxine XR (EFFEXOR-XR) 150 MG 24 hr capsule TAKE 1 CAPSULE (150 MG TOTAL) BY MOUTH DAILY WITH BREAKFAST. (Patient taking differently: No sig reported) 90 capsule 4   . gabapentin (NEURONTIN) 300 MG capsule Take 1-2 capsules (300-600 mg total) by mouth 3 (three) times daily as needed. (Patient not taking: No sig reported) 90 capsule 0 Not Taking at Unknown time   Allergies  Allergen Reactions  . Anastrozole Anaphylaxis  . Contrast Media [Iodinated Diagnostic Agents] Shortness Of Breath    Difficulty breathing with chills on 08/29/2012  . Darvocet [Propoxyphene N-Acetaminophen] Shortness Of Breath and Swelling  . Propoxyphene Anaphylaxis, Shortness Of Breath and Swelling  . Shellfish Allergy Shortness Of Breath and Swelling  . Other     CT dye, "chest hurt" "makes me feel cold"  . Prednisone Hives and Swelling  . Tape     petechiae/redness    Social History   Tobacco Use  . Smoking status: Never Smoker  . Smokeless tobacco: Never Used  Substance Use Topics  . Alcohol use: No    Family History  Problem Relation Age of Onset  . Hypertension Mother   . Uterine cancer Mother   . Heart disease Mother   . Bladder Cancer Maternal Uncle 68  . Cancer Paternal Grandmother 44       lung cancer  . Lung cancer Paternal Grandfather        dx in his 77s  . Prostate cancer Father   . COPD Paternal Uncle   . Colon cancer Neg Hx   . Esophageal cancer Neg Hx   . Stomach cancer Neg Hx   . Rectal cancer Neg Hx      Review of Systems  Musculoskeletal: Positive for arthralgias.       Right hip  All other systems reviewed and are negative.   Objective:  Physical  Exam Constitutional:      Appearance: Normal appearance.  HENT:     Head: Normocephalic and atraumatic.     Mouth/Throat:     Pharynx: Oropharynx is clear.  Eyes:     Extraocular Movements: Extraocular movements intact.  Cardiovascular:     Rate and Rhythm: Normal rate and regular rhythm.  Pulmonary:     Effort: Pulmonary effort is normal.  Abdominal:     Palpations: Abdomen is soft.  Musculoskeletal:     Cervical back: Normal range of motion.     Comments: Right hip motion is extremely painful.  Her leg lengths are roughly equal.  She is walking with a markedly altered gait.  Sensation and motor function are intact distally with palpable pulses in her feet.    Skin:    General: Skin is warm and dry.  Neurological:     General: No focal deficit present.     Mental Status: She is alert and oriented to person, place, and time.  Psychiatric:  Mood and Affect: Mood normal.        Behavior: Behavior normal.        Thought Content: Thought content normal.        Judgment: Judgment normal.     Vital signs in last 24 hours: Temp:  [98.5 F (36.9 C)] 98.5 F (36.9 C) (03/25 1022) Pulse Rate:  [91] 91 (03/25 1022) BP: (130)/(88) 130/88 (03/25 1022) SpO2:  [99 %] 99 % (03/25 1022)  Labs:   Estimated body mass index is 37.74 kg/m as calculated from the following:   Height as of 05/20/19: 5\' 6"  (1.676 m).   Weight as of 08/28/19: 106.1 kg.   Imaging Review Plain radiographs demonstrate severe degenerative joint disease of the right hip(s). The bone quality appears to be good for age and reported activity level.  Assessment/Plan:  End stage primary arthritis, right hip(s)  The patient history, physical examination, clinical judgement of the provider and imaging studies are consistent with end stage degenerative joint disease of the right hip(s) and total hip arthroplasty is deemed medically necessary. The treatment options including medical management, injection therapy,  arthroscopy and arthroplasty were discussed at length. The risks and benefits of total hip arthroplasty were presented and reviewed. The risks due to aseptic loosening, infection, stiffness, dislocation/subluxation,  thromboembolic complications and other imponderables were discussed.  The patient acknowledged the explanation, agreed to proceed with the plan and consent was signed. Patient is being admitted for inpatient treatment for surgery, pain control, PT, OT, prophylactic antibiotics, VTE prophylaxis, progressive ambulation and ADL's and discharge planning.The patient is planning to be discharged home with home health services

## 2020-05-21 NOTE — Progress Notes (Signed)
COVID Vaccine Completed: NO Date COVID Vaccine completed: COVID vaccine manufacturer: [place]   PCP - Dr. Quillian Quince Pomposini Cardiologist -   Chest x-ray - 05/11/20: Chart EKG -  Stress Test -  ECHO - 04/06/15 Cardiac Cath -  Pacemaker/ICD device last checked:  Sleep Study -  CPAP -   Fasting Blood Sugar -  Checks Blood Sugar _____ times a day  Blood Thinner Instructions: Aspirin Instructions: Last Dose:  Anesthesia review:   Patient denies shortness of breath, fever, cough and chest pain at PAT appointment   Patient verbalized understanding of instructions that were given to them at the PAT appointment. Patient was also instructed that they will need to review over the PAT instructions again at home before surgery.

## 2020-05-21 NOTE — Progress Notes (Signed)
Placed on chart Clearance dated 05/11/20 from DR Margaretha Sheffield .  Office visit note dated 05/05/2020.

## 2020-05-24 MED ORDER — BUPIVACAINE LIPOSOME 1.3 % IJ SUSP
10.0000 mL | Freq: Once | INTRAMUSCULAR | Status: DC
  Filled 2020-05-24: qty 10

## 2020-05-24 MED ORDER — TRANEXAMIC ACID 1000 MG/10ML IV SOLN
2000.0000 mg | INTRAVENOUS | Status: DC
  Filled 2020-05-24: qty 20

## 2020-05-24 NOTE — Progress Notes (Signed)
EKG done 05/05/20 placed on chart.

## 2020-05-25 ENCOUNTER — Ambulatory Visit (HOSPITAL_COMMUNITY): Payer: BC Managed Care – PPO | Admitting: Anesthesiology

## 2020-05-25 ENCOUNTER — Other Ambulatory Visit: Payer: Self-pay

## 2020-05-25 ENCOUNTER — Ambulatory Visit (HOSPITAL_COMMUNITY): Payer: BC Managed Care – PPO

## 2020-05-25 ENCOUNTER — Observation Stay (HOSPITAL_COMMUNITY)
Admission: RE | Admit: 2020-05-25 | Discharge: 2020-05-26 | Disposition: A | Discharge status: Home / Self Care | Payer: BC Managed Care – PPO | Attending: Orthopaedic Surgery | Admitting: Orthopaedic Surgery

## 2020-05-25 ENCOUNTER — Encounter (HOSPITAL_COMMUNITY): Payer: Self-pay | Admitting: Orthopaedic Surgery

## 2020-05-25 ENCOUNTER — Encounter (HOSPITAL_COMMUNITY)
Admission: RE | Disposition: A | Discharge status: Home / Self Care | Payer: Self-pay | Source: Home / Self Care | Attending: Orthopaedic Surgery

## 2020-05-25 DIAGNOSIS — Z853 Personal history of malignant neoplasm of breast: Secondary | ICD-10-CM | POA: Diagnosis not present

## 2020-05-25 DIAGNOSIS — Z7982 Long term (current) use of aspirin: Secondary | ICD-10-CM | POA: Insufficient documentation

## 2020-05-25 DIAGNOSIS — M1611 Unilateral primary osteoarthritis, right hip: Principal | ICD-10-CM | POA: Insufficient documentation

## 2020-05-25 DIAGNOSIS — Z79899 Other long term (current) drug therapy: Secondary | ICD-10-CM | POA: Insufficient documentation

## 2020-05-25 DIAGNOSIS — Z419 Encounter for procedure for purposes other than remedying health state, unspecified: Secondary | ICD-10-CM

## 2020-05-25 HISTORY — PX: TOTAL HIP ARTHROPLASTY: SHX124

## 2020-05-25 LAB — TYPE AND SCREEN
ABO/RH(D): O POS
Antibody Screen: NEGATIVE

## 2020-05-25 LAB — ABO/RH: ABO/RH(D): O POS

## 2020-05-25 SURGERY — ARTHROPLASTY, HIP, TOTAL, ANTERIOR APPROACH
Anesthesia: Spinal | Site: Hip | Laterality: Right

## 2020-05-25 MED ORDER — EPHEDRINE 5 MG/ML INJ
INTRAVENOUS | Status: AC
  Filled 2020-05-25: qty 10

## 2020-05-25 MED ORDER — LACTATED RINGERS IV BOLUS
500.0000 mL | Freq: Once | INTRAVENOUS | Status: AC
  Administered 2020-05-25: 500 mL via INTRAVENOUS

## 2020-05-25 MED ORDER — FENTANYL CITRATE (PF) 100 MCG/2ML IJ SOLN
INTRAMUSCULAR | Status: AC
  Filled 2020-05-25: qty 2

## 2020-05-25 MED ORDER — ONDANSETRON HCL 4 MG/2ML IJ SOLN
INTRAMUSCULAR | Status: AC
  Filled 2020-05-25: qty 2

## 2020-05-25 MED ORDER — 0.9 % SODIUM CHLORIDE (POUR BTL) OPTIME
TOPICAL | Status: DC | PRN
  Administered 2020-05-25: 1000 mL

## 2020-05-25 MED ORDER — PHENYLEPHRINE 40 MCG/ML (10ML) SYRINGE FOR IV PUSH (FOR BLOOD PRESSURE SUPPORT)
PREFILLED_SYRINGE | INTRAVENOUS | Status: AC
  Filled 2020-05-25: qty 10

## 2020-05-25 MED ORDER — ASPIRIN EC 81 MG PO TBEC: 81.0000 mg | DELAYED_RELEASE_TABLET | Freq: Two times a day (BID) | ORAL | 11 refills | Status: AC

## 2020-05-25 MED ORDER — CEFAZOLIN SODIUM-DEXTROSE 2-4 GM/100ML-% IV SOLN
2.0000 g | INTRAVENOUS | Status: AC
  Administered 2020-05-25: 2 g via INTRAVENOUS
  Filled 2020-05-25: qty 100

## 2020-05-25 MED ORDER — TRANEXAMIC ACID 1000 MG/10ML IV SOLN
INTRAVENOUS | Status: DC | PRN
  Administered 2020-05-25: 2000 mg via TOPICAL

## 2020-05-25 MED ORDER — HYDROMORPHONE HCL 1 MG/ML IJ SOLN
0.2500 mg | INTRAMUSCULAR | Status: DC | PRN
  Administered 2020-05-25: 0.5 mg via INTRAVENOUS

## 2020-05-25 MED ORDER — TRANEXAMIC ACID-NACL 1000-0.7 MG/100ML-% IV SOLN
1000.0000 mg | INTRAVENOUS | Status: AC
  Administered 2020-05-25: 1000 mg via INTRAVENOUS
  Filled 2020-05-25: qty 100

## 2020-05-25 MED ORDER — LACTATED RINGERS IV SOLN: INTRAVENOUS | Status: DC

## 2020-05-25 MED ORDER — BUPIVACAINE LIPOSOME 1.3 % IJ SUSP
INTRAMUSCULAR | Status: DC | PRN
  Administered 2020-05-25: 10 mL

## 2020-05-25 MED ORDER — CEFAZOLIN SODIUM-DEXTROSE 2-4 GM/100ML-% IV SOLN
2.0000 g | Freq: Four times a day (QID) | INTRAVENOUS | Status: AC
  Administered 2020-05-25 (×2): 2 g via INTRAVENOUS
  Filled 2020-05-25: qty 100

## 2020-05-25 MED ORDER — FENTANYL CITRATE (PF) 100 MCG/2ML IJ SOLN
INTRAMUSCULAR | Status: DC | PRN
  Administered 2020-05-25 (×2): 50 ug via INTRAVENOUS

## 2020-05-25 MED ORDER — CEFAZOLIN SODIUM-DEXTROSE 1-4 GM/50ML-% IV SOLN
INTRAVENOUS | Status: AC
  Filled 2020-05-25: qty 50

## 2020-05-25 MED ORDER — PROPOFOL 1000 MG/100ML IV EMUL
INTRAVENOUS | Status: AC
  Filled 2020-05-25: qty 100

## 2020-05-25 MED ORDER — BUPIVACAINE-EPINEPHRINE (PF) 0.25% -1:200000 IJ SOLN
INTRAMUSCULAR | Status: DC | PRN
  Administered 2020-05-25: 30 mL

## 2020-05-25 MED ORDER — DEXAMETHASONE SODIUM PHOSPHATE 10 MG/ML IJ SOLN
INTRAMUSCULAR | Status: AC
  Filled 2020-05-25: qty 1

## 2020-05-25 MED ORDER — TIZANIDINE HCL 4 MG PO TABS: 4.0000 mg | ORAL_TABLET | Freq: Four times a day (QID) | ORAL | 1 refills | Status: DC | PRN

## 2020-05-25 MED ORDER — HYDROCODONE-ACETAMINOPHEN 7.5-325 MG PO TABS: 1.0000 | ORAL_TABLET | Freq: Four times a day (QID) | ORAL | 0 refills | Status: AC | PRN

## 2020-05-25 MED ORDER — BUPIVACAINE-EPINEPHRINE (PF) 0.25% -1:200000 IJ SOLN
INTRAMUSCULAR | Status: AC
  Filled 2020-05-25: qty 30

## 2020-05-25 MED ORDER — CHLORHEXIDINE GLUCONATE 0.12 % MT SOLN: 15.0000 mL | Freq: Once | OROMUCOSAL | Status: AC

## 2020-05-25 MED ORDER — HYDROMORPHONE HCL 1 MG/ML IJ SOLN
INTRAMUSCULAR | Status: AC
  Filled 2020-05-25: qty 1

## 2020-05-25 MED ORDER — TRANEXAMIC ACID-NACL 1000-0.7 MG/100ML-% IV SOLN: 1000.0000 mg | Freq: Once | INTRAVENOUS | Status: AC

## 2020-05-25 MED ORDER — PROPOFOL 500 MG/50ML IV EMUL
INTRAVENOUS | Status: DC | PRN
  Administered 2020-05-25: 75 ug/kg/min via INTRAVENOUS

## 2020-05-25 MED ORDER — HYDROCODONE-ACETAMINOPHEN 5-325 MG PO TABS
1.0000 | ORAL_TABLET | ORAL | Status: DC | PRN
  Administered 2020-05-25 – 2020-05-26 (×2): 2 via ORAL
  Filled 2020-05-25 (×2): qty 2

## 2020-05-25 MED ORDER — LACTATED RINGERS IV BOLUS: 250.0000 mL | Freq: Once | INTRAVENOUS | Status: DC

## 2020-05-25 MED ORDER — POVIDONE-IODINE 10 % EX SWAB
2.0000 "application " | Freq: Once | CUTANEOUS | Status: AC
  Administered 2020-05-25: 2 via TOPICAL

## 2020-05-25 MED ORDER — MIDAZOLAM HCL 5 MG/5ML IJ SOLN
INTRAMUSCULAR | Status: DC | PRN
  Administered 2020-05-25 (×2): 1 mg via INTRAVENOUS

## 2020-05-25 MED ORDER — PHENYLEPHRINE 40 MCG/ML (10ML) SYRINGE FOR IV PUSH (FOR BLOOD PRESSURE SUPPORT)
PREFILLED_SYRINGE | INTRAVENOUS | Status: DC | PRN
  Administered 2020-05-25 (×3): 80 ug via INTRAVENOUS

## 2020-05-25 MED ORDER — LACTATED RINGERS IV BOLUS
250.0000 mL | Freq: Once | INTRAVENOUS | Status: AC
  Administered 2020-05-25: 250 mL via INTRAVENOUS

## 2020-05-25 MED ORDER — ONDANSETRON HCL 4 MG/2ML IJ SOLN
INTRAMUSCULAR | Status: DC | PRN
  Administered 2020-05-25: 4 mg via INTRAVENOUS

## 2020-05-25 MED ORDER — HYDROCODONE-ACETAMINOPHEN 7.5-325 MG PO TABS
1.0000 | ORAL_TABLET | ORAL | Status: DC | PRN
  Administered 2020-05-25 – 2020-05-26 (×3): 2 via ORAL
  Filled 2020-05-25 (×3): qty 2

## 2020-05-25 MED ORDER — MIDAZOLAM HCL 2 MG/2ML IJ SOLN
INTRAMUSCULAR | Status: AC
  Filled 2020-05-25: qty 2

## 2020-05-25 MED ORDER — VENLAFAXINE HCL ER 150 MG PO CP24
150.0000 mg | ORAL_CAPSULE | Freq: Every day | ORAL | Status: DC
  Administered 2020-05-25: 150 mg via ORAL
  Filled 2020-05-25: qty 1

## 2020-05-25 MED ORDER — TRANEXAMIC ACID-NACL 1000-0.7 MG/100ML-% IV SOLN
INTRAVENOUS | Status: AC
  Administered 2020-05-25: 1000 mg
  Filled 2020-05-25: qty 100

## 2020-05-25 MED ORDER — HYDROCODONE-ACETAMINOPHEN 7.5-325 MG PO TABS
ORAL_TABLET | ORAL | Status: AC
  Administered 2020-05-26: 2 via ORAL
  Filled 2020-05-25: qty 2

## 2020-05-25 MED ORDER — METHOCARBAMOL 500 MG IVPB - SIMPLE MED
500.0000 mg | Freq: Four times a day (QID) | INTRAVENOUS | Status: DC | PRN
  Filled 2020-05-25: qty 50

## 2020-05-25 MED ORDER — ORAL CARE MOUTH RINSE
15.0000 mL | Freq: Once | OROMUCOSAL | Status: AC
  Administered 2020-05-25: 15 mL via OROMUCOSAL

## 2020-05-25 MED ORDER — BUPIVACAINE IN DEXTROSE 0.75-8.25 % IT SOLN
INTRATHECAL | Status: DC | PRN
  Administered 2020-05-25: 1.6 mL via INTRATHECAL

## 2020-05-25 MED ORDER — DEXAMETHASONE SODIUM PHOSPHATE 10 MG/ML IJ SOLN
INTRAMUSCULAR | Status: DC | PRN
  Administered 2020-05-25: 10 mg via INTRAVENOUS

## 2020-05-25 MED ORDER — METHOCARBAMOL 500 MG PO TABS
500.0000 mg | ORAL_TABLET | Freq: Four times a day (QID) | ORAL | Status: DC | PRN
  Filled 2020-05-25: qty 1

## 2020-05-25 SURGICAL SUPPLY — 47 items
BAG DECANTER FOR FLEXI CONT (MISCELLANEOUS) ×2 IMPLANT
BLADE SAW SGTL 18X1.27X75 (BLADE) ×2 IMPLANT
BOOTIES KNEE HIGH SLOAN (MISCELLANEOUS) ×2 IMPLANT
CELLS DAT CNTRL 66122 CELL SVR (MISCELLANEOUS) ×1 IMPLANT
COVER PERINEAL POST (MISCELLANEOUS) ×2 IMPLANT
COVER SURGICAL LIGHT HANDLE (MISCELLANEOUS) ×2 IMPLANT
COVER WAND RF STERILE (DRAPES) IMPLANT
CUP ACETABULAR GRIPTON 100 52 (Orthopedic Implant) ×1 IMPLANT
DECANTER SPIKE VIAL GLASS SM (MISCELLANEOUS) ×2 IMPLANT
DRAPE IMP U-DRAPE 54X76 (DRAPES) ×2 IMPLANT
DRAPE ORTHO SPLIT 77X108 STRL (DRAPES)
DRAPE STERI IOBAN 125X83 (DRAPES) ×2 IMPLANT
DRAPE SURG ORHT 6 SPLT 77X108 (DRAPES) IMPLANT
DRAPE U-SHAPE 47X51 STRL (DRAPES) ×4 IMPLANT
DRSG AQUACEL AG ADV 3.5X 6 (GAUZE/BANDAGES/DRESSINGS) ×2 IMPLANT
DURAPREP 26ML APPLICATOR (WOUND CARE) ×2 IMPLANT
ELECT BLADE TIP CTD 4 INCH (ELECTRODE) ×2 IMPLANT
ELECT REM PT RETURN 15FT ADLT (MISCELLANEOUS) ×2 IMPLANT
ELIMINATOR HOLE APEX DEPUY (Hips) ×2 IMPLANT
GLOVE SRG 8 PF TXTR STRL LF DI (GLOVE) ×2 IMPLANT
GLOVE SURG ENC MOIS LTX SZ8 (GLOVE) ×4 IMPLANT
GLOVE SURG UNDER POLY LF SZ8 (GLOVE) ×2
GOWN STRL REUS W/TWL XL LVL3 (GOWN DISPOSABLE) ×4 IMPLANT
GRIPTON 100 52 (Orthopedic Implant) ×2 IMPLANT
HEAD CERAMIC 36 PLUS5 (Hips) ×2 IMPLANT
HOLDER FOLEY CATH W/STRAP (MISCELLANEOUS) ×2 IMPLANT
KIT TURNOVER KIT A (KITS) ×2 IMPLANT
LINER NEUTRAL 52X36MM PLUS 4 (Liner) ×2 IMPLANT
MANIFOLD NEPTUNE II (INSTRUMENTS) ×2 IMPLANT
NEEDLE HYPO 22GX1.5 SAFETY (NEEDLE) ×2 IMPLANT
NS IRRIG 1000ML POUR BTL (IV SOLUTION) ×2 IMPLANT
PACK ANTERIOR HIP CUSTOM (KITS) ×2 IMPLANT
PENCIL SMOKE EVACUATOR (MISCELLANEOUS) IMPLANT
PROTECTOR NERVE ULNAR (MISCELLANEOUS) ×2 IMPLANT
RTRCTR WOUND ALEXIS 18CM MED (MISCELLANEOUS) ×2
STEM FEMORAL SZ8 STD ACTIS (Stem) ×2 IMPLANT
SUT ETHIBOND NAB CT1 #1 30IN (SUTURE) ×4 IMPLANT
SUT VIC AB 1 CT1 36 (SUTURE) ×2 IMPLANT
SUT VIC AB 2-0 CT1 27 (SUTURE) ×1
SUT VIC AB 2-0 CT1 TAPERPNT 27 (SUTURE) ×1 IMPLANT
SUT VICRYL AB 3-0 FS1 BRD 27IN (SUTURE) ×2 IMPLANT
SUT VLOC 180 0 24IN GS25 (SUTURE) ×2 IMPLANT
SUT VLOC 180 ABS0 18IN GS21 (SUTURE) ×2 IMPLANT
SYR 50ML LL SCALE MARK (SYRINGE) ×2 IMPLANT
TRAY FOLEY MTR SLVR 14FR STAT (SET/KITS/TRAYS/PACK) ×2 IMPLANT
TRAY FOLEY MTR SLVR 16FR STAT (SET/KITS/TRAYS/PACK) IMPLANT
TUBE SUCTION HIGH CAP CLEAR NV (SUCTIONS) ×2 IMPLANT

## 2020-05-25 NOTE — Interval H&P Note (Signed)
History and Physical Interval Note:  05/25/2020 9:16 AM  Elizabeth Miles  has presented today for surgery, with the diagnosis of RIGHT HIP DEGENERATIVE JOINT DISEASE.  The various methods of treatment have been discussed with the patient and family. After consideration of risks, benefits and other options for treatment, the patient has consented to  Procedure(s): RIGHT TOTAL HIP ARTHROPLASTY ANTERIOR APPROACH (Right) as a surgical intervention.  The patient's history has been reviewed, patient examined, no change in status, stable for surgery.  I have reviewed the patient's chart and labs.  Questions were answered to the patient's satisfaction.     Hessie Dibble

## 2020-05-25 NOTE — Evaluation (Signed)
Physical Therapy Evaluation Patient Details Name: Elizabeth Miles MRN: [id number] DOB: April 24, 1965 Today's Date: 05/25/2020   History of Present Illness  Patient is 55 y.o. female s/p Rt THA anterior approach on 05/25/20 with PMH significant for GERD, depression, anxiety, anemia, breast cancer, reprots history of syncope post procedure.  Clinical Impression  Pt is a 55yo female s/p Rt THA POD 0. Pt reports that she is modified independent with intermittent use of cane for mobility at baseline. Pt required MIN guard with cues for safe hand placement for sit to stand transfer. Pt became dizzy/lightheaded, "hot," and pale in standing. Pt was assisted back to supine, BP found to be 99/24mmHg. BP at start of session 115/45mmHg. Symptoms resolved with supine rest and BP improved to115/64mmHg at EOS. MD and PA present at bedside during EOS with pt in supine, pt is currently not safe for discharge home. PT reviewed therapeutic intervention for promotion of DVT prevention, pt demonstrated understanding. Pt will have assistance from her son and parents upon discharge. Pt will benefit from skilled PT to increase independence and safety with mobility. Acute therapy to follow up during stay to progress functional mobility as able to ensure safe discharge home.       Follow Up Recommendations Follow surgeon's recommendation for DC plan and follow-up therapies;Home health PT    Equipment Recommendations  Rolling walker with 5" wheels    Recommendations for Other Services       Precautions / Restrictions Precautions Precautions: Fall Restrictions Weight Bearing Restrictions: No Other Position/Activity Restrictions: WBAT      Mobility  Bed Mobility Overal bed mobility: Needs Assistance Bed Mobility: Supine to Sit     Supine to sit: Supervision;HOB elevated     General bed mobility comments: use of bed rails abd B UEs to scoot to EOB with supervision for safety    Transfers Overall transfer  level: Needs assistance Equipment used: Rolling walker (2 wheeled) Transfers: Sit to/from Stand Sit to Stand: Min guard         General transfer comment: MIN guard for safety with cues for safe hand placement. Pt performed pre gait marching with use of B UEs on RW and no LOB. Pt became dizzy/lightheaded, "hot," and pale in standing. Pt was assisted back to supine, BP found to be 99/14mmHg. BP at start of session 115/28mmHg. Symptoms resolved with supine rest and BP improved to115/25mmHg at EOS.  Ambulation/Gait                Stairs            Wheelchair Mobility    Modified Rankin (Stroke Patients Only)       Balance Overall balance assessment: Needs assistance Sitting-balance support: Feet supported Sitting balance-Leahy Scale: Good     Standing balance support: Bilateral upper extremity supported;During functional activity Standing balance-Leahy Scale: Poor Standing balance comment: use of RW for standing balance                             Pertinent Vitals/Pain Pain Assessment: 0-10 Pain Score: 5  Pain Location: Rt hip Pain Descriptors / Indicators: Sore Pain Intervention(s): Limited activity within patient's tolerance;Monitored during session;Repositioned    Home Living Family/patient expects to be discharged to:: Private residence Living Arrangements: Children Available Help at Discharge: Family Type of Home: House Home Access: Stairs to enter Entrance Stairs-Rails: Can reach both Entrance Stairs-Number of Steps: 5 Home Layout: One level Home Equipment: Kasandra Knudsen -  single point;Shower seat - built in Additional Comments: pt will have assist from her son and parents at home    Prior Function Level of Independence: Independent with assistive device(s)         Comments: intermittent use of cane     Hand Dominance   Dominant Hand: Right    Extremity/Trunk Assessment   Upper Extremity Assessment Upper Extremity Assessment: Overall  WFL for tasks assessed    Lower Extremity Assessment Lower Extremity Assessment: RLE deficits/detail RLE Deficits / Details: pt with good Rt quad set and 4/5 B dorsi/plantar flexion strength RLE Sensation: WNL RLE Coordination: WNL    Cervical / Trunk Assessment Cervical / Trunk Assessment: Normal  Communication   Communication: No difficulties  Cognition Arousal/Alertness: Awake/alert Behavior During Therapy: WFL for tasks assessed/performed Overall Cognitive Status: Within Functional Limits for tasks assessed                                        General Comments      Exercises Total Joint Exercises Ankle Circles/Pumps: AROM;Both;20 reps;Supine   Assessment/Plan    PT Assessment Patient needs continued PT services  PT Problem List Decreased strength;Decreased range of motion;Decreased activity tolerance;Decreased balance;Decreased mobility;Decreased knowledge of use of DME;Pain;Cardiopulmonary status limiting activity       PT Treatment Interventions DME instruction;Functional mobility training;Stair training;Gait training;Therapeutic exercise;Therapeutic activities;Balance training;Patient/family education    PT Goals (Current goals can be found in the Care Plan section)  Acute Rehab PT Goals Patient Stated Goal: none stated PT Goal Formulation: With patient/family Time For Goal Achievement: 06/01/20 Potential to Achieve Goals: Good    Frequency 7X/week   Barriers to discharge        Co-evaluation               AM-PAC PT "6 Clicks" Mobility  Outcome Measure Help needed turning from your back to your side while in a flat bed without using bedrails?: None Help needed moving from lying on your back to sitting on the side of a flat bed without using bedrails?: A Little Help needed moving to and from a bed to a chair (including a wheelchair)?: A Little Help needed standing up from a chair using your arms (e.g., wheelchair or bedside  chair)?: A Little Help needed to walk in hospital room?: A Little Help needed climbing 3-5 steps with a railing? : A Lot 6 Click Score: 18    End of Session Equipment Utilized During Treatment: Gait belt Activity Tolerance: Treatment limited secondary to medical complications (Comment) (symptomatic orthostasis) Patient left: in bed;with call bell/phone within reach;with nursing/sitter in room;with family/visitor present Nurse Communication: Mobility status (pt symptoms during session) PT Visit Diagnosis: Unsteadiness on feet (R26.81);Muscle weakness (generalized) (M62.81);Pain Pain - Right/Left: Right Pain - part of body: Hip    Time: 7035-0093 PT Time Calculation (min) (ACUTE ONLY): 26 min   Charges:             Elna Breslow, SPT  Acute rehab    Elna Breslow 05/25/2020, 5:08 PM

## 2020-05-25 NOTE — Brief Op Note (Signed)
1307: Patient sat up to have refreshment.  1309: Patient stated that she is not feeling well. Reported feeling dizzy.  BP 119/79,  HR 43 to 48 from 80s,  Oxygen saturation 99 on R/A, RR 15  1314 Attempting to reach anesthesiologist.  1320 HR now back to 80s. Dr Oren Bracket at bedside. Stated that patient reportedly has history of fainting post procedures and probably had a fainting spell .  Patient now appears comfortable.  1355 C/P 8/10  Dilaudid .5mg  given IV.

## 2020-05-25 NOTE — Op Note (Signed)

## 2020-05-25 NOTE — Anesthesia Procedure Notes (Signed)
Spinal  Patient location during procedure: OR Reason for block: surgical anesthesia Staffing Performed: resident/CRNA  Resident/CRNA: Akyra Bouchie D, CRNA Preanesthetic Checklist Completed: patient identified, IV checked, site marked, risks and benefits discussed, surgical consent, monitors and equipment checked, pre-op evaluation and timeout performed Spinal Block Patient position: sitting Prep: Betadine Patient monitoring: heart rate, continuous pulse ox and blood pressure Approach: midline Location: L3-4 Injection technique: single-shot Needle Needle type: Sprotte  Needle gauge: 24 G Needle length: 9 cm Assessment Sensory level: T6 Events: CSF return Additional Notes Expiration date of kit checked and confirmed. Patient tolerated procedure well, without complications.

## 2020-05-25 NOTE — Anesthesia Preprocedure Evaluation (Addendum)
Anesthesia Evaluation  Patient identified by MRN, date of birth, ID band Patient awake    Reviewed: Allergy & Precautions, H&P , NPO status , Patient's Chart, lab work & pertinent test results  Airway Mallampati: II  TM Distance: >3 FB Neck ROM: Full    Dental no notable dental hx. (+) Teeth Intact, Dental Advisory Given   Pulmonary neg pulmonary ROS,    Pulmonary exam normal breath sounds clear to auscultation       Cardiovascular negative cardio ROS   Rhythm:Regular Rate:Normal     Neuro/Psych  Headaches, Anxiety Depression    GI/Hepatic Neg liver ROS, GERD  Medicated,  Endo/Other  negative endocrine ROS  Renal/GU negative Renal ROS  negative genitourinary   Musculoskeletal   Abdominal   Peds  Hematology  (+) Blood dyscrasia, anemia ,   Anesthesia Other Findings   Reproductive/Obstetrics negative OB ROS                            Anesthesia Physical Anesthesia Plan  ASA: II  Anesthesia Plan: Spinal   Post-op Pain Management:    Induction: Intravenous  PONV Risk Score and Plan: 3 and Propofol infusion, Midazolam and Ondansetron  Airway Management Planned: Simple Face Mask  Additional Equipment:   Intra-op Plan:   Post-operative Plan:   Informed Consent: I have reviewed the patients History and Physical, chart, labs and discussed the procedure including the risks, benefits and alternatives for the proposed anesthesia with the patient or authorized representative who has indicated his/her understanding and acceptance.     Dental advisory given  Plan Discussed with: CRNA  Anesthesia Plan Comments:         Anesthesia Quick Evaluation

## 2020-05-25 NOTE — Anesthesia Postprocedure Evaluation (Signed)
Anesthesia Post Note  Patient: Elizabeth Miles  Procedure(s) Performed: RIGHT TOTAL HIP ARTHROPLASTY ANTERIOR APPROACH (Right Hip)     Patient location during evaluation: PACU Anesthesia Type: Spinal Level of consciousness: oriented and awake and alert Pain management: pain level controlled Vital Signs Assessment: post-procedure vital signs reviewed and stable Respiratory status: spontaneous breathing and respiratory function stable Cardiovascular status: blood pressure returned to baseline and stable Postop Assessment: no headache, no backache, no apparent nausea or vomiting, spinal receding and patient able to bend at knees Anesthetic complications: no   No complications documented.  Last Vitals:  Vitals:   05/25/20 1218 05/25/20 1243  BP:    Pulse:    Resp:    Temp: (!) 36.4 C   SpO2:  99%    Last Pain:  Vitals:   05/25/20 1245  TempSrc:   PainSc: 0-No pain                 Kye Hedden,W. EDMOND

## 2020-05-25 NOTE — Transfer of Care (Signed)
Immediate Anesthesia Transfer of Care Note  Patient: Elizabeth Miles  Procedure(s) Performed: RIGHT TOTAL HIP ARTHROPLASTY ANTERIOR APPROACH (Right Hip)  Patient Location: PACU  Anesthesia Type:Spinal  Level of Consciousness: awake, alert  and oriented  Airway & Oxygen Therapy: Patient Spontanous Breathing and Patient connected to face mask oxygen  Post-op Assessment: Report given to RN and Post -op Vital signs reviewed and stable  Post vital signs: Reviewed and stable  Last Vitals:  Vitals Value Taken Time  BP 112/90 05/25/20 1218  Temp    Pulse 95 05/25/20 1219  Resp 15 05/25/20 1219  SpO2 99 % 05/25/20 1219  Vitals shown include unvalidated device data.  Last Pain:  Vitals:   05/25/20 0756  TempSrc: Oral         Complications: No complications documented.

## 2020-05-26 ENCOUNTER — Encounter (HOSPITAL_COMMUNITY): Payer: Self-pay | Admitting: Orthopaedic Surgery

## 2020-05-26 DIAGNOSIS — M1611 Unilateral primary osteoarthritis, right hip: Secondary | ICD-10-CM | POA: Diagnosis not present

## 2020-05-26 MED ORDER — ASPIRIN 81 MG PO CHEW
81.0000 mg | CHEWABLE_TABLET | Freq: Two times a day (BID) | ORAL | Status: DC
  Administered 2020-05-26: 81 mg via ORAL
  Filled 2020-05-26: qty 1

## 2020-05-26 MED ORDER — ONDANSETRON HCL 4 MG/2ML IJ SOLN: 4.0000 mg | Freq: Four times a day (QID) | INTRAMUSCULAR | Status: DC | PRN

## 2020-05-26 MED ORDER — BISACODYL 5 MG PO TBEC: 5.0000 mg | DELAYED_RELEASE_TABLET | Freq: Every day | ORAL | Status: DC | PRN

## 2020-05-26 MED ORDER — DIPHENHYDRAMINE HCL 12.5 MG/5ML PO ELIX: 12.5000 mg | ORAL_SOLUTION | ORAL | Status: DC | PRN

## 2020-05-26 MED ORDER — METOCLOPRAMIDE HCL 5 MG PO TABS: 5.0000 mg | ORAL_TABLET | Freq: Three times a day (TID) | ORAL | Status: DC | PRN

## 2020-05-26 MED ORDER — PANTOPRAZOLE SODIUM 40 MG PO TBEC
40.0000 mg | DELAYED_RELEASE_TABLET | Freq: Every day | ORAL | Status: DC
  Administered 2020-05-26: 40 mg via ORAL
  Filled 2020-05-26: qty 1

## 2020-05-26 MED ORDER — ACETAMINOPHEN 500 MG PO TABS: 500.0000 mg | ORAL_TABLET | Freq: Four times a day (QID) | ORAL | Status: DC

## 2020-05-26 MED ORDER — MORPHINE SULFATE (PF) 2 MG/ML IV SOLN: 0.5000 mg | INTRAVENOUS | Status: DC | PRN

## 2020-05-26 MED ORDER — ALUM & MAG HYDROXIDE-SIMETH 200-200-20 MG/5ML PO SUSP: 30.0000 mL | ORAL | Status: DC | PRN

## 2020-05-26 MED ORDER — DOCUSATE SODIUM 100 MG PO CAPS
100.0000 mg | ORAL_CAPSULE | Freq: Two times a day (BID) | ORAL | Status: DC
  Administered 2020-05-26: 100 mg via ORAL
  Filled 2020-05-26: qty 1

## 2020-05-26 MED ORDER — PHENOL 1.4 % MT LIQD: 1.0000 | OROMUCOSAL | Status: DC | PRN

## 2020-05-26 MED ORDER — ACETAMINOPHEN 325 MG PO TABS: 325.0000 mg | ORAL_TABLET | Freq: Four times a day (QID) | ORAL | Status: DC | PRN

## 2020-05-26 MED ORDER — METOCLOPRAMIDE HCL 5 MG/ML IJ SOLN: 5.0000 mg | Freq: Three times a day (TID) | INTRAMUSCULAR | Status: DC | PRN

## 2020-05-26 MED ORDER — MENTHOL 3 MG MT LOZG: 1.0000 | LOZENGE | OROMUCOSAL | Status: DC | PRN

## 2020-05-26 MED ORDER — ONDANSETRON HCL 4 MG PO TABS: 4.0000 mg | ORAL_TABLET | Freq: Four times a day (QID) | ORAL | Status: DC | PRN

## 2020-05-26 NOTE — Progress Notes (Signed)
Physical Therapy Treatment Patient Details Name: Elizabeth Miles MRN: [id number] DOB: 12-04-1965 Today's Date: 05/26/2020    History of Present Illness Patient is 55 y.o. female s/p Rt THA anterior approach on 05/25/20 with PMH significant for GERD, depression, anxiety, anemia, breast cancer, reprots history of syncope post procedure.    PT Comments    POD # 1 am session Assisted OOB to amb to bathroom.  General bed mobility comments: demonstarted and instructred on proper tech using belt loop to self assist with increased time but able.  General transfer comment: 25% VC's on proper hand placement and increased time.  Also assisted with a toilet transfers VC's safety with turns.  General Gait Details: tolerated a functional distance to bathroom and in hallway with mild c/o feeling "fuzzy" but no true dizziness.  Pt also c/o feeling "hot" so assisted to recliner.  Improving.  Then returned to room to perform some TE's following HEP handout.  Instructed on proper tech, freq as well as use of ICE.   Pt will need another PT session to address stairs and complete HEP.   Follow Up Recommendations  Follow surgeon's recommendation for DC plan and follow-up therapies;Home health PT     Equipment Recommendations  Rolling walker with 5" wheels    Recommendations for Other Services       Precautions / Restrictions Restrictions Weight Bearing Restrictions: No Other Position/Activity Restrictions: WBAT    Mobility  Bed Mobility Overal bed mobility: Needs Assistance Bed Mobility: Supine to Sit;Sit to Supine     Supine to sit: Supervision Sit to supine: Supervision;Min guard   General bed mobility comments: demonstarted and instructred on proper tech using belt loop to self assist with increased time but able    Transfers Overall transfer level: Needs assistance Equipment used: Rolling walker (2 wheeled) Transfers: Sit to/from [facility] Sit to Stand: Supervision Stand  pivot transfers: Supervision;Min guard       General transfer comment: 25% VC's on proper hand placement and increased time.  Also assisted with a toilet transfers VC's safety with turns  Ambulation/Gait Ambulation/Gait assistance: Supervision Gait Distance (Feet): 24 Feet Assistive device: Rolling walker (2 wheeled) Gait Pattern/deviations: Step-to pattern;Decreased stance time - right Gait velocity: decreased   General Gait Details: tolerated a functional distance to bathroom and in hallway with mild c/o feeling "fuzzy" but no true dizziness.  Pt also c/o feeling "hot" so assisted to recliner.  Improving   Stairs             Wheelchair Mobility    Modified Rankin (Stroke Patients Only)       Balance                                            Cognition Arousal/Alertness: Awake/alert Behavior During Therapy: WFL for tasks assessed/performed Overall Cognitive Status: Within Functional Limits for tasks assessed                                 General Comments: AxO x 3 very motivated      Exercises   Total Hip Replacement TE's following HEP Handout 10 reps ankle pumps 05 reps knee presses 05 reps heel slides 05 reps SAQ's 05 reps ABD Instructed how to use a belt loop to assist  Followed by ICE  General Comments        Pertinent Vitals/Pain Pain Assessment: No/denies pain Pain Score: 5  Pain Location: Rt hip Pain Descriptors / Indicators: Sore;Aching;Discomfort;Operative site guarding Pain Intervention(s): Monitored during session;Premedicated before session;Repositioned;Ice applied    Home Living                      Prior Function            PT Goals (current goals can now be found in the care plan section) Progress towards PT goals: Progressing toward goals    Frequency    7X/week      PT Plan Current plan remains appropriate    Co-evaluation              AM-PAC PT "6 Clicks"  Mobility   Outcome Measure  Help needed turning from your back to your side while in a flat bed without using bedrails?: None Help needed moving from lying on your back to sitting on the side of a flat bed without using bedrails?: A Little Help needed moving to and from a bed to a chair (including a wheelchair)?: A Little Help needed standing up from a chair using your arms (e.g., wheelchair or bedside chair)?: A Little Help needed to walk in hospital room?: A Little Help needed climbing 3-5 steps with a railing? : A Lot 6 Click Score: 18    End of Session Equipment Utilized During Treatment: Gait belt Activity Tolerance: Treatment limited secondary to medical complications (Comment) Patient left: in bed;with call bell/phone within reach;with nursing/sitter in room;with family/visitor present Nurse Communication: Mobility status PT Visit Diagnosis: Unsteadiness on feet (R26.81);Muscle weakness (generalized) (M62.81);Pain Pain - Right/Left: Right Pain - part of body: Hip     Time: 1022-1050 PT Time Calculation (min) (ACUTE ONLY): 28 min  Charges:  $Gait Training: 8-22 mins $Therapeutic Exercise: 8-22 mins                     Rica Koyanagi  PTA Acute  Rehabilitation Services Pager      [phone] Office      [phone]

## 2020-05-26 NOTE — Progress Notes (Signed)
Physical Therapy Treatment Patient Details Name: Elizabeth Miles MRN: [id number] DOB: November 20, 1965 Today's Date: 05/26/2020    History of Present Illness Patient is 55 y.o. female s/p Rt THA anterior approach on 05/25/20 with PMH significant for GERD, depression, anxiety, anemia, breast cancer, reprots history of syncope post procedure.    PT Comments    POD # 1 pm session. Mom and dad present to observe.  Assisted OOB self able using belt loop and increased time.  Assisted with amb an increased distance in hallway.  Practiced stairs. General stair comments: <25% VC's on proper sequencing and safety pt successfully navigated stairs using B rails.  Father present and observed. Then returned to room to perform some standing TE's following HEP handout.  Instructed on proper tech, freq as well as use of ICE.  Addressed all mobility questions, discussed appropriate activity, educated on use of ICE.  Pt ready for D/C to home.    Follow Up Recommendations  Follow surgeon's recommendation for DC plan and follow-up therapies;Home health PT     Equipment Recommendations  Rolling walker with 5" wheels    Recommendations for Other Services       Precautions / Restrictions Restrictions Weight Bearing Restrictions: No Other Position/Activity Restrictions: WBAT    Mobility  Bed Mobility Overal bed mobility: Needs Assistance Bed Mobility: Supine to Sit;Sit to Supine     Supine to sit: Supervision Sit to supine: Supervision;Min guard   General bed mobility comments: demonstarted and instructred on proper tech using belt loop to self assist with increased time but able    Transfers Overall transfer level: Needs assistance Equipment used: Rolling walker (2 wheeled) Transfers: Sit to/from [facility] Sit to Stand: Supervision Stand pivot transfers: Supervision;Min guard       General transfer comment: 25% VC's on proper hand placement and increased time.  Also assisted with  a toilet transfers VC's safety with turns  Ambulation/Gait Ambulation/Gait assistance: Supervision Gait Distance (Feet): 60 Feet Assistive device: Rolling walker (2 wheeled) Gait Pattern/deviations: Step-to pattern;Decreased stance time - right Gait velocity: decreased   General Gait Details: tolerated a functional distance to bathroom and in hallway with mild c/o feeling "fuzzy" but no true dizziness.  Pt also c/o feeling "hot" so assisted to recliner.  Improving   Stairs Stairs: Yes Stairs assistance: Supervision;Min guard Stair Management: Two rails;Forwards;Step to pattern Number of Stairs: 2 General stair comments: <25% VC's on proper sequencing and safety pt successfully navigated stairs using B rails.  Father present and observed.   Wheelchair Mobility    Modified Rankin (Stroke Patients Only)       Balance                                            Cognition Arousal/Alertness: Awake/alert Behavior During Therapy: WFL for tasks assessed/performed Overall Cognitive Status: Within Functional Limits for tasks assessed                                 General Comments: AxO x 3 very motivated      Exercises      General Comments        Pertinent Vitals/Pain Pain Assessment: No/denies pain Pain Score: 5  Pain Location: Rt hip Pain Descriptors / Indicators: Sore;Aching;Discomfort;Operative site guarding Pain Intervention(s): Monitored during session;Premedicated before session;Repositioned;Ice  applied    Home Living                      Prior Function            PT Goals (current goals can now be found in the care plan section) Progress towards PT goals: Progressing toward goals    Frequency    7X/week      PT Plan Current plan remains appropriate    Co-evaluation              AM-PAC PT "6 Clicks" Mobility   Outcome Measure  Help needed turning from your back to your side while in a flat bed  without using bedrails?: None Help needed moving from lying on your back to sitting on the side of a flat bed without using bedrails?: A Little Help needed moving to and from a bed to a chair (including a wheelchair)?: A Little Help needed standing up from a chair using your arms (e.g., wheelchair or bedside chair)?: A Little Help needed to walk in hospital room?: A Little Help needed climbing 3-5 steps with a railing? : A Lot 6 Click Score: 18    End of Session Equipment Utilized During Treatment: Gait belt Activity Tolerance: Treatment limited secondary to medical complications (Comment) Patient left: in chair Nurse Communication: Mobility status PT Visit Diagnosis: Unsteadiness on feet (R26.81);Muscle weakness (generalized) (M62.81);Pain Pain - Right/Left: Right Pain - part of body: Hip     Time: 1325-1350 PT Time Calculation (min) (ACUTE ONLY): 25 min  Charges:  $Gait Training: 8-22 mins $Therapeutic Exercise: 8-22 mins                     Rica Koyanagi  PTA Acute  Rehabilitation Services Pager      [phone] Office      [phone]

## 2020-05-26 NOTE — TOC Transition Note (Signed)
Transition of Care [facility]) - CM/SW Discharge Note   Patient Details  Name: Elizabeth Miles MRN: [id number] Date of Birth: 10-29-1965  [facility] CM/SW Contact:  Lennart Pall, LCSW Phone Number: 05/26/2020, 10:03 AM   Clinical Narrative:    Met with pt to review dc needs. Confirming need for 3n1 and rw - [facility] referred.  Plan for HHPT via [facility] in [place].  No further TOC needs.  Hopes to dc home today.   Final next level of care: [place] Barriers to Discharge: No Barriers Identified   Patient Goals and CMS Choice Patient states their goals for this hospitalization and ongoing recovery are:: return home      Discharge Placement                       Discharge Plan and Services                DME Arranged: 3-N-1,Walker rolling DME Agency: [facility]     Representative spoke with at DME Agency: Cristie Hem HH Arranged: PT [place]: [place] (of [place]) Date [place]:  (ordered prior to surgery)   Representative spoke with at [place]: [place] ([place]) Interventions     Readmission Risk Interventions No flowsheet data found.

## 2020-05-26 NOTE — Plan of Care (Signed)
Patient discharged home in stable condition 

## 2020-05-26 NOTE — Progress Notes (Signed)
Subjective: 1 Day Post-Op Procedure(s) (LRB): RIGHT TOTAL HIP ARTHROPLASTY ANTERIOR APPROACH (Right)   Patient is resting comfortably in bed this morning and is wanting to go home today.  Activity level:  wbat Diet tolerance:  ok Voiding:  ok Patient reports pain as mild.    Objective: Vital signs in last 24 hours: Temp:  [97.5 F (36.4 C)-98.9 F (37.2 C)] 98 F (36.7 C) (03/30 0544) Pulse Rate:  [77-96] 89 (03/30 0544) Resp:  [12-20] 16 (03/30 0544) BP: (102-142)/(70-87) 142/87 (03/30 0544) SpO2:  [90 %-100 %] 98 % (03/30 0544) Weight:  [110.6 kg] 110.6 kg (03/30 0027)  Labs: No results for input(s): HGB in the last 72 hours. No results for input(s): WBC, RBC, HCT, PLT in the last 72 hours. No results for input(s): NA, K, CL, CO2, BUN, CREATININE, GLUCOSE, CALCIUM in the last 72 hours. No results for input(s): LABPT, INR in the last 72 hours.  Physical Exam:  Neurologically intact ABD soft Neurovascular intact Sensation intact distally Intact pulses distally Dorsiflexion/Plantar flexion intact Incision: dressing C/D/I and no drainage No cellulitis present Compartment soft  Assessment/Plan:  1 Day Post-Op Procedure(s) (LRB): RIGHT TOTAL HIP ARTHROPLASTY ANTERIOR APPROACH (Right) Advance diet Up with therapy D/C IV fluids Discharge home with home health today after PT. Follow up in office 2 weeks post op. Continue on 81mg  ASA for DVT prevention.    Elizabeth Miles 05/26/2020, 8:21 AM

## 2020-05-26 NOTE — Discharge Summary (Signed)
Patient ID: Elizabeth Miles MRN: [id number] DOB/AGE: 04-Sep-1965 55 y.o.  Admit date: 05/25/2020 Discharge date: 05/26/2020  Admission Diagnoses:  Principal Problem:   Primary osteoarthritis of right hip   Discharge Diagnoses:  Same  Past Medical History:  Diagnosis Date  . Allergy   . Anemia   . Anxiety   . Breast cancer ([place]) 08/06/12   invasive ductal carcioma  . Complication of anesthesia    1986 ; problem waking up  . Depression   . GERD (gastroesophageal reflux disease)   . GERD (gastroesophageal reflux disease) 08/22/2012  . Wears glasses     Surgeries: Procedure(s): RIGHT TOTAL HIP ARTHROPLASTY ANTERIOR APPROACH on 05/25/2020   Consultants:   Discharged Condition: Improved  Hospital Course: Elizabeth Miles is an 55 y.o. female who was admitted 05/25/2020 for operative treatment ofPrimary osteoarthritis of right hip. Patient has severe unremitting pain that affects sleep, daily activities, and work/hobbies. After pre-op clearance the patient was taken to the operating room on 05/25/2020 and underwent  Procedure(s): RIGHT TOTAL HIP ARTHROPLASTY ANTERIOR APPROACH.    Patient was given perioperative antibiotics:  Anti-infectives (From admission, onward)   Start     Dose/Rate Route Frequency Ordered Stop   05/25/20 1738  ceFAZolin (ANCEF) 1-4 GM/50ML-% IVPB       Note to Pharmacy: Lanell Persons  : cabinet override      05/25/20 1738 05/26/20 0544   05/25/20 1630  ceFAZolin (ANCEF) IVPB 2g/100 mL premix        2 g 200 mL/hr over 30 Minutes Intravenous Every 6 hours 05/25/20 1420 05/25/20 2208   05/25/20 0800  ceFAZolin (ANCEF) IVPB 2g/100 mL premix        2 g 200 mL/hr over 30 Minutes Intravenous On call to O.R. 05/25/20 0748 05/25/20 1020       Patient was given sequential compression devices, early ambulation, and chemoprophylaxis to prevent DVT.  Patient benefited maximally from hospital stay and there were no complications.    Recent vital signs:  Patient  Vitals for the past 24 hrs:  BP Temp Temp src Pulse Resp SpO2 Height Weight  05/26/20 0544 (!) 142/87 98 F (36.7 C) -- 89 16 98 % -- --  05/26/20 0117 124/70 98.9 F (37.2 C) -- 85 18 98 % -- --  05/26/20 0027 -- -- -- -- -- -- 5\' 6"  (1.676 m) 110.6 kg  05/25/20 2054 115/74 98.4 F (36.9 C) -- 87 18 94 % -- --  05/25/20 1825 124/83 98.1 F (36.7 C) Oral 84 16 97 % -- --  05/25/20 1800 115/76 97.9 F (36.6 C) -- 91 16 95 % -- --  05/25/20 1700 120/77 -- -- 96 16 99 % -- --  05/25/20 1600 -- -- -- 84 16 98 % -- --  05/25/20 1506 116/80 98 F (36.7 C) -- 90 16 97 % -- --  05/25/20 1500 102/76 -- -- 90 17 96 % -- --  05/25/20 1430 115/79 -- -- 88 16 96 % -- --  05/25/20 1415 116/78 -- -- 87 17 96 % -- --  05/25/20 1400 111/77 -- -- 84 17 100 % -- --  05/25/20 1345 104/76 -- -- 90 20 96 % -- --  05/25/20 1330 111/79 -- -- 87 13 96 % -- --  05/25/20 1315 119/79 -- -- 84 16 90 % -- --  05/25/20 1300 109/77 -- -- 77 12 96 % -- --  05/25/20 1245 114/78 -- -- 81 13 99 % -- --  05/25/20 1243 -- -- -- -- -- 99 % -- --  05/25/20 1230 121/82 -- -- 88 16 100 % -- --  05/25/20 1218 -- (!) 97.5 F (36.4 C) -- -- -- -- -- --     Recent laboratory studies: No results for input(s): WBC, HGB, HCT, PLT, NA, K, CL, CO2, BUN, CREATININE, GLUCOSE, INR, CALCIUM in the last 72 hours.  Invalid input(s): PT, 2   Discharge Medications:   Allergies as of 05/26/2020      Reactions   Anastrozole Anaphylaxis   Contrast Media [iodinated Diagnostic Agents] Shortness Of Breath   Difficulty breathing with chills on 08/29/2012   Darvocet [propoxyphene N-acetaminophen] Shortness Of Breath, Swelling   Propoxyphene Anaphylaxis, Shortness Of Breath, Swelling   Shellfish Allergy Shortness Of Breath, Swelling   Other    CT dye, "chest hurt" "makes me feel cold"   Prednisone Hives, Swelling   Tape    petechiae/redness      Medication List    STOP taking these medications   gabapentin 300 MG capsule Commonly  known as: NEURONTIN     TAKE these medications   acetaminophen 500 MG tablet Commonly known as: TYLENOL Take 1,000 mg by mouth every 6 (six) hours as needed for moderate pain. Reported on 06/14/2015   aspirin EC 81 MG tablet Take 1 tablet (81 mg total) by mouth 2 (two) times daily after a meal. What changed: when to take this   cetirizine 10 MG chewable tablet Commonly known as: ZYRTEC Chew 10 mg by mouth at bedtime.   Dexlansoprazole 30 MG capsule Commonly known as: Dexilant Take 1 capsule (30 mg total) by mouth daily.   HYDROcodone-acetaminophen 7.5-325 MG tablet Commonly known as: Norco Take 1-2 tablets by mouth every 6 (six) hours as needed for moderate pain or severe pain (post op pain).   ibuprofen 200 MG tablet Commonly known as: ADVIL Take 400 mg by mouth every 8 (eight) hours as needed for moderate pain (for back pain). Reported on 06/14/2015   tiZANidine 4 MG tablet Commonly known as: Zanaflex Take 1 tablet (4 mg total) by mouth every 6 (six) hours as needed for muscle spasms.   venlafaxine XR 150 MG 24 hr capsule Commonly known as: EFFEXOR-XR TAKE 1 CAPSULE (150 MG TOTAL) BY MOUTH DAILY WITH BREAKFAST. What changed: when to take this   Vitamin D3 25 MCG (1000 UT) Caps TAKE 1 CAPSULE BY MOUTH ONCE DAILY.            Durable Medical Equipment  (From admission, onward)         Start     Ordered   05/25/20 1509  DME Walker rolling  Once       Question:  Patient needs a walker to treat with the following condition  Answer:  Primary osteoarthritis of right hip   05/25/20 1508   05/25/20 1509  DME 3 n 1  Once        05/25/20 1508   05/25/20 1509  DME Bedside commode  Once       Question:  Patient needs a bedside commode to treat with the following condition  Answer:  Primary osteoarthritis of right hip   05/25/20 1508          Diagnostic Studies: DG C-Arm 1-60 Min-No Report  Result Date: 05/25/2020 Fluoroscopy was utilized by the requesting  physician.  No radiographic interpretation.   DG HIP OPERATIVE UNILAT W OR W/O PELVIS RIGHT  Result Date: 05/25/2020 CLINICAL DATA:  Right shoulder arthroplasty EXAM: OPERATIVE RIGHT HIP (WITH PELVIS IF PERFORMED)  VIEWS TECHNIQUE: Fluoroscopic spot image(s) were submitted for interpretation post-operatively. COMPARISON:  None. FINDINGS: Multiple intraoperative fluoroscopic spot images are provided. Interval right total hip arthroplasty. Normal alignment. FLUOROSCOPY TIME:  3.94 mGy IMPRESSION: 1. Interval right total hip arthroplasty. Electronically Signed   By: Kathreen Devoid   On: 05/25/2020 14:02    Disposition: Discharge disposition: 01-Home or Self Care       Discharge Instructions    Call MD / Call 911   Complete by: As directed    If you experience chest pain or shortness of breath, CALL 911 and be transported to the hospital emergency room.  If you develope a fever above 101 F, pus (white drainage) or increased drainage or redness at the wound, or calf pain, call your surgeon's office.   Call MD / Call 911   Complete by: As directed    If you experience chest pain or shortness of breath, CALL 911 and be transported to the hospital emergency room.  If you develope a fever above 101 F, pus (white drainage) or increased drainage or redness at the wound, or calf pain, call your surgeon's office.   Constipation Prevention   Complete by: As directed    Drink plenty of fluids.  Prune juice may be helpful.  You may use a stool softener, such as Colace (over the counter) 100 mg twice a day.  Use MiraLax (over the counter) for constipation as needed.   Constipation Prevention   Complete by: As directed    Drink plenty of fluids.  Prune juice may be helpful.  You may use a stool softener, such as Colace (over the counter) 100 mg twice a day.  Use MiraLax (over the counter) for constipation as needed.   Diet - low sodium heart healthy   Complete by: As directed    Diet - low sodium heart  healthy   Complete by: As directed    Discharge instructions   Complete by: As directed    INSTRUCTIONS AFTER JOINT REPLACEMENT   Remove items at home which could result in a fall. This includes throw rugs or furniture in walking pathways ICE to the affected joint every three hours while awake for 30 minutes at a time, for at least the first 3-5 days, and then as needed for pain and swelling.  Continue to use ice for pain and swelling. You may notice swelling that will progress down to the foot and ankle.  This is normal after surgery.  Elevate your leg when you are not up walking on it.   Continue to use the breathing machine you got in the hospital (incentive spirometer) which will help keep your temperature down.  It is common for your temperature to cycle up and down following surgery, especially at night when you are not up moving around and exerting yourself.  The breathing machine keeps your lungs expanded and your temperature down.   DIET:  As you were doing prior to hospitalization, we recommend a well-balanced diet.  DRESSING / WOUND CARE / SHOWERING  You may shower 3 days after surgery, but keep the wounds dry during showering.  You may use an occlusive plastic wrap (Press'n Seal for example), NO SOAKING/SUBMERGING IN THE BATHTUB.  If the bandage gets wet, change with a clean dry gauze.  If the incision gets wet, pat the wound dry with a clean towel.  ACTIVITY  Increase activity slowly as tolerated,  but follow the weight bearing instructions below.   No driving for 6 weeks or until further direction given by your physician.  You cannot drive while taking narcotics.  No lifting or carrying greater than 10 lbs. until further directed by your surgeon. Avoid periods of inactivity such as sitting longer than an hour when not asleep. This helps prevent blood clots.  You may return to work once you are authorized by your doctor.     WEIGHT BEARING   Weight bearing as tolerated with  assist device (walker, cane, etc) as directed, use it as long as suggested by your surgeon or therapist, typically at least 4-6 weeks.   EXERCISES  Results after joint replacement surgery are often greatly improved when you follow the exercise, range of motion and muscle strengthening exercises prescribed by your doctor. Safety measures are also important to protect the joint from further injury. Any time any of these exercises cause you to have increased pain or swelling, decrease what you are doing until you are comfortable again and then slowly increase them. If you have problems or questions, call your caregiver or physical therapist for advice.   Rehabilitation is important following a joint replacement. After just a few days of immobilization, the muscles of the leg can become weakened and shrink (atrophy).  These exercises are designed to build up the tone and strength of the thigh and leg muscles and to improve motion. Often times heat used for twenty to thirty minutes before working out will loosen up your tissues and help with improving the range of motion but do not use heat for the first two weeks following surgery (sometimes heat can increase post-operative swelling).   These exercises can be done on a training (exercise) mat, on the floor, on a table or on a bed. Use whatever works the best and is most comfortable for you.    Use music or television while you are exercising so that the exercises are a pleasant break in your day. This will make your life better with the exercises acting as a break in your routine that you can look forward to.   Perform all exercises about fifteen times, three times per day or as directed.  You should exercise both the operative leg and the other leg as well.  Exercises include:   Quad Sets - Tighten up the muscle on the front of the thigh (Quad) and hold for 5-10 seconds.   Straight Leg Raises - With your knee straight (if you were given a brace, keep it  on), lift the leg to 60 degrees, hold for 3 seconds, and slowly lower the leg.  Perform this exercise against resistance later as your leg gets stronger.  Leg Slides: Lying on your back, slowly slide your foot toward your buttocks, bending your knee up off the floor (only go as far as is comfortable). Then slowly slide your foot back down until your leg is flat on the floor again.  Angel Wings: Lying on your back spread your legs to the side as far apart as you can without causing discomfort.  Hamstring Strength:  Lying on your back, push your heel against the floor with your leg straight by tightening up the muscles of your buttocks.  Repeat, but this time bend your knee to a comfortable angle, and push your heel against the floor.  You may put a pillow under the heel to make it more comfortable if necessary.   A rehabilitation program following joint  replacement surgery can speed recovery and prevent re-injury in the future due to weakened muscles. Contact your doctor or a physical therapist for more information on knee rehabilitation.    CONSTIPATION  Constipation is defined medically as fewer than three stools per week and severe constipation as less than one stool per week.  Even if you have a regular bowel pattern at home, your normal regimen is likely to be disrupted due to multiple reasons following surgery.  Combination of anesthesia, postoperative narcotics, change in appetite and fluid intake all can affect your bowels.   YOU MUST use at least one of the following options; they are listed in order of increasing strength to get the job done.  They are all available over the counter, and you may need to use some, POSSIBLY even all of these options:    Drink plenty of fluids (prune juice may be helpful) and high fiber foods Colace 100 mg by mouth twice a day  Senokot for constipation as directed and as needed Dulcolax (bisacodyl), take with full glass of water  Miralax (polyethylene glycol)  once or twice a day as needed.  If you have tried all these things and are unable to have a bowel movement in the first 3-4 days after surgery call either your surgeon or your primary doctor.    If you experience loose stools or diarrhea, hold the medications until you stool forms back up.  If your symptoms do not get better within 1 week or if they get worse, check with your doctor.  If you experience "the worst abdominal pain ever" or develop nausea or vomiting, please contact the office immediately for further recommendations for treatment.   ITCHING:  If you experience itching with your medications, try taking only a single pain pill, or even half a pain pill at a time.  You can also use Benadryl over the counter for itching or also to help with sleep.   TED HOSE STOCKINGS:  Use stockings on both legs until for at least 2 weeks or as directed by physician office. They may be removed at night for sleeping.  MEDICATIONS:  See your medication summary on the "After Visit Summary" that nursing will review with you.  You may have some home medications which will be placed on hold until you complete the course of blood thinner medication.  It is important for you to complete the blood thinner medication as prescribed.  PRECAUTIONS:  If you experience chest pain or shortness of breath - call 911 immediately for transfer to the hospital emergency department.   If you develop a fever greater that 101 F, purulent drainage from wound, increased redness or drainage from wound, foul odor from the wound/dressing, or calf pain - CONTACT YOUR SURGEON.                                                   FOLLOW-UP APPOINTMENTS:  If you do not already have a post-op appointment, please call the office for an appointment to be seen by your surgeon.  Guidelines for how soon to be seen are listed in your "After Visit Summary", but are typically between 1-4 weeks after surgery.  OTHER INSTRUCTIONS:   Knee  Replacement:  Do not place pillow under knee, focus on keeping the knee straight while resting. CPM instructions: 0-90 degrees, 2  hours in the morning, 2 hours in the afternoon, and 2 hours in the evening. Place foam block, curve side up under heel at all times except when in CPM or when walking.  DO NOT modify, tear, cut, or change the foam block in any way.  POST-OPERATIVE OPIOID TAPER INSTRUCTIONS: It is important to wean off of your opioid medication as soon as possible. If you do not need pain medication after your surgery it is ok to stop day one. Opioids include: Codeine, Hydrocodone(Norco, Vicodin), Oxycodone(Percocet, oxycontin) and hydromorphone amongst others.  Long term and even short term use of opiods can cause: Increased pain response Dependence Constipation Depression Respiratory depression And more.  Withdrawal symptoms can include Flu like symptoms Nausea, vomiting And more Techniques to manage these symptoms Hydrate well Eat regular healthy meals Stay active Use relaxation techniques(deep breathing, meditating, yoga) Do Not substitute Alcohol to help with tapering If you have been on opioids for less than two weeks and do not have pain than it is ok to stop all together.  Plan to wean off of opioids This plan should start within one week post op of your joint replacement. Maintain the same interval or time between taking each dose and first decrease the dose.  Cut the total daily intake of opioids by one tablet each day Next start to increase the time between doses. The last dose that should be eliminated is the evening dose.     MAKE SURE YOU:  Understand these instructions.  Get help right away if you are not doing well or get worse.    Thank you for letting us be a part of your medical care team.  It is a privilege we respect greatly.  We hope these instructions will help you stay on track for a fast and full recovery!   Discharge instructions   Complete  by: As directed    INSTRUCTIONS AFTER JOINT REPLACEMENT   Remove items at home which could result in a fall. This includes throw rugs or furniture in walking pathways ICE to the affected joint every three hours while awake for 30 minutes at a time, for at least the first 3-5 days, and then as needed for pain and swelling.  Continue to use ice for pain and swelling. You may notice swelling that will progress down to the foot and ankle.  This is normal after surgery.  Elevate your leg when you are not up walking on it.   Continue to use the breathing machine you got in the hospital (incentive spirometer) which will help keep your temperature down.  It is common for your temperature to cycle up and down following surgery, especially at night when you are not up moving around and exerting yourself.  The breathing machine keeps your lungs expanded and your temperature down.   DIET:  As you were doing prior to hospitalization, we recommend a well-balanced diet.  DRESSING / WOUND CARE / SHOWERING  You may shower 3 days after surgery, but keep the wounds dry during showering.  You may use an occlusive plastic wrap (Press'n Seal for example), NO SOAKING/SUBMERGING IN THE BATHTUB.  If the bandage gets wet, change with a clean dry gauze.  If the incision gets wet, pat the wound dry with a clean towel.  ACTIVITY  Increase activity slowly as tolerated, but follow the weight bearing instructions below.   No driving for 6 weeks or until further direction given by your physician.  You cannot drive while taking  narcotics.  No lifting or carrying greater than 10 lbs. until further directed by your surgeon. Avoid periods of inactivity such as sitting longer than an hour when not asleep. This helps prevent blood clots.  You may return to work once you are authorized by your doctor.     WEIGHT BEARING   Weight bearing as tolerated with assist device (walker, cane, etc) as directed, use it as long as suggested  by your surgeon or therapist, typically at least 4-6 weeks.   EXERCISES  Results after joint replacement surgery are often greatly improved when you follow the exercise, range of motion and muscle strengthening exercises prescribed by your doctor. Safety measures are also important to protect the joint from further injury. Any time any of these exercises cause you to have increased pain or swelling, decrease what you are doing until you are comfortable again and then slowly increase them. If you have problems or questions, call your caregiver or physical therapist for advice.   Rehabilitation is important following a joint replacement. After just a few days of immobilization, the muscles of the leg can become weakened and shrink (atrophy).  These exercises are designed to build up the tone and strength of the thigh and leg muscles and to improve motion. Often times heat used for twenty to thirty minutes before working out will loosen up your tissues and help with improving the range of motion but do not use heat for the first two weeks following surgery (sometimes heat can increase post-operative swelling).   These exercises can be done on a training (exercise) mat, on the floor, on a table or on a bed. Use whatever works the best and is most comfortable for you.    Use music or television while you are exercising so that the exercises are a pleasant break in your day. This will make your life better with the exercises acting as a break in your routine that you can look forward to.   Perform all exercises about fifteen times, three times per day or as directed.  You should exercise both the operative leg and the other leg as well.  Exercises include:   Quad Sets - Tighten up the muscle on the front of the thigh (Quad) and hold for 5-10 seconds.   Straight Leg Raises - With your knee straight (if you were given a brace, keep it on), lift the leg to 60 degrees, hold for 3 seconds, and slowly lower the  leg.  Perform this exercise against resistance later as your leg gets stronger.  Leg Slides: Lying on your back, slowly slide your foot toward your buttocks, bending your knee up off the floor (only go as far as is comfortable). Then slowly slide your foot back down until your leg is flat on the floor again.  Angel Wings: Lying on your back spread your legs to the side as far apart as you can without causing discomfort.  Hamstring Strength:  Lying on your back, push your heel against the floor with your leg straight by tightening up the muscles of your buttocks.  Repeat, but this time bend your knee to a comfortable angle, and push your heel against the floor.  You may put a pillow under the heel to make it more comfortable if necessary.   A rehabilitation program following joint replacement surgery can speed recovery and prevent re-injury in the future due to weakened muscles. Contact your doctor or a physical therapist for more information on knee rehabilitation.  CONSTIPATION  Constipation is defined medically as fewer than three stools per week and severe constipation as less than one stool per week.  Even if you have a regular bowel pattern at home, your normal regimen is likely to be disrupted due to multiple reasons following surgery.  Combination of anesthesia, postoperative narcotics, change in appetite and fluid intake all can affect your bowels.   YOU MUST use at least one of the following options; they are listed in order of increasing strength to get the job done.  They are all available over the counter, and you may need to use some, POSSIBLY even all of these options:    Drink plenty of fluids (prune juice may be helpful) and high fiber foods Colace 100 mg by mouth twice a day  Senokot for constipation as directed and as needed Dulcolax (bisacodyl), take with full glass of water  Miralax (polyethylene glycol) once or twice a day as needed.  If you have tried all these things and  are unable to have a bowel movement in the first 3-4 days after surgery call either your surgeon or your primary doctor.    If you experience loose stools or diarrhea, hold the medications until you stool forms back up.  If your symptoms do not get better within 1 week or if they get worse, check with your doctor.  If you experience "the worst abdominal pain ever" or develop nausea or vomiting, please contact the office immediately for further recommendations for treatment.   ITCHING:  If you experience itching with your medications, try taking only a single pain pill, or even half a pain pill at a time.  You can also use Benadryl over the counter for itching or also to help with sleep.   TED HOSE STOCKINGS:  Use stockings on both legs until for at least 2 weeks or as directed by physician office. They may be removed at night for sleeping.  MEDICATIONS:  See your medication summary on the "After Visit Summary" that nursing will review with you.  You may have some home medications which will be placed on hold until you complete the course of blood thinner medication.  It is important for you to complete the blood thinner medication as prescribed.  PRECAUTIONS:  If you experience chest pain or shortness of breath - call 911 immediately for transfer to the hospital emergency department.   If you develop a fever greater that 101 F, purulent drainage from wound, increased redness or drainage from wound, foul odor from the wound/dressing, or calf pain - CONTACT YOUR SURGEON.                                                   FOLLOW-UP APPOINTMENTS:  If you do not already have a post-op appointment, please call the office for an appointment to be seen by your surgeon.  Guidelines for how soon to be seen are listed in your "After Visit Summary", but are typically between 1-4 weeks after surgery.  OTHER INSTRUCTIONS:   Knee Replacement:  Do not place pillow under knee, focus on keeping the knee straight  while resting. CPM instructions: 0-90 degrees, 2 hours in the morning, 2 hours in the afternoon, and 2 hours in the evening. Place foam block, curve side up under heel at all times except when in CPM or  when walking.  DO NOT modify, tear, cut, or change the foam block in any way.  POST-OPERATIVE OPIOID TAPER INSTRUCTIONS: It is important to wean off of your opioid medication as soon as possible. If you do not need pain medication after your surgery it is ok to stop day one. Opioids include: Codeine, Hydrocodone(Norco, Vicodin), Oxycodone(Percocet, oxycontin) and hydromorphone amongst others.  Long term and even short term use of opiods can cause: Increased pain response Dependence Constipation Depression Respiratory depression And more.  Withdrawal symptoms can include Flu like symptoms Nausea, vomiting And more Techniques to manage these symptoms Hydrate well Eat regular healthy meals Stay active Use relaxation techniques(deep breathing, meditating, yoga) Do Not substitute Alcohol to help with tapering If you have been on opioids for less than two weeks and do not have pain than it is ok to stop all together.  Plan to wean off of opioids This plan should start within one week post op of your joint replacement. Maintain the same interval or time between taking each dose and first decrease the dose.  Cut the total daily intake of opioids by one tablet each day Next start to increase the time between doses. The last dose that should be eliminated is the evening dose.     MAKE SURE YOU:  Understand these instructions.  Get help right away if you are not doing well or get worse.    Thank you for letting us be a part of your medical care team.  It is a privilege we respect greatly.  We hope these instructions will help you stay on track for a fast and full recovery!   Increase activity slowly as tolerated   Complete by: As directed    Increase activity slowly as tolerated    Complete by: As directed        Follow-up Information    Melrose Nakayama, MD. Schedule an appointment as soon as possible for a visit in 2 weeks.   Specialty: Orthopedic Surgery Contact information: [place] [address] [phone]                Signed: Larwance Sachs Aela Bohan 05/26/2020, 8:23 AM

## 2020-07-21 ENCOUNTER — Encounter: Payer: Self-pay | Admitting: Oncology

## 2020-08-05 ENCOUNTER — Encounter: Payer: Self-pay | Admitting: Oncology

## 2020-08-27 ENCOUNTER — Telehealth: Payer: Self-pay | Admitting: Adult Health

## 2020-08-27 ENCOUNTER — Encounter: Payer: BC Managed Care – PPO | Admitting: Adult Health

## 2020-08-27 NOTE — Telephone Encounter (Signed)
Scheduled appointment per 07/01 sch msg. Patient is aware.

## 2020-10-01 ENCOUNTER — Encounter: Payer: BC Managed Care – PPO | Admitting: Adult Health

## 2021-01-17 ENCOUNTER — Other Ambulatory Visit: Payer: Self-pay

## 2021-01-17 ENCOUNTER — Inpatient Hospital Stay: Payer: BC Managed Care – PPO | Attending: Adult Health | Admitting: Adult Health

## 2021-01-17 ENCOUNTER — Encounter: Payer: Self-pay | Admitting: Adult Health

## 2021-01-17 VITALS — BP 138/95 | HR 97 | Temp 97.7°F | Resp 18 | Ht 66.0 in | Wt 228.4 lb

## 2021-01-17 DIAGNOSIS — Z923 Personal history of irradiation: Secondary | ICD-10-CM | POA: Insufficient documentation

## 2021-01-17 DIAGNOSIS — M8580 Other specified disorders of bone density and structure, unspecified site: Secondary | ICD-10-CM | POA: Insufficient documentation

## 2021-01-17 DIAGNOSIS — C50411 Malignant neoplasm of upper-outer quadrant of right female breast: Secondary | ICD-10-CM

## 2021-01-17 DIAGNOSIS — Z170 Estrogen receptor positive status [ER+]: Secondary | ICD-10-CM | POA: Diagnosis not present

## 2021-01-17 DIAGNOSIS — Z9221 Personal history of antineoplastic chemotherapy: Secondary | ICD-10-CM | POA: Diagnosis not present

## 2021-01-17 DIAGNOSIS — Z853 Personal history of malignant neoplasm of breast: Secondary | ICD-10-CM | POA: Insufficient documentation
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
## 2021-01-18 ENCOUNTER — Other Ambulatory Visit: Payer: Self-pay | Admitting: Oncology

## 2021-01-18 ENCOUNTER — Encounter: Payer: Self-pay | Admitting: General Practice

## 2021-01-18 ENCOUNTER — Telehealth: Payer: Self-pay | Admitting: General Practice

## 2021-01-18 DIAGNOSIS — C50411 Malignant neoplasm of upper-outer quadrant of right female breast: Secondary | ICD-10-CM
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
## 2021-01-19 NOTE — Telephone Encounter (Signed)
error 

## 2021-06-30 ENCOUNTER — Other Ambulatory Visit: Payer: Self-pay

## 2021-06-30 DIAGNOSIS — C50411 Malignant neoplasm of upper-outer quadrant of right female breast: Secondary | ICD-10-CM

## 2021-06-30 NOTE — Progress Notes (Signed)
Orders entered for Bone Density sent to [facility].  ?

## 2021-07-01 ENCOUNTER — Encounter: Payer: Self-pay | Admitting: Adult Health

## 2022-07-18 ENCOUNTER — Encounter: Payer: Self-pay | Admitting: Adult Health

## 2023-08-02 ENCOUNTER — Encounter: Payer: Self-pay | Admitting: Adult Health

## 2023-08-07 ENCOUNTER — Encounter: Payer: Self-pay | Admitting: Adult Health

## 2024-01-29 ENCOUNTER — Telehealth: Payer: Self-pay | Admitting: Gastroenterology

## 2024-01-29 NOTE — Telephone Encounter (Signed)
Letter faxed per request  

## 2024-01-29 NOTE — Telephone Encounter (Signed)
 Inbound call from [facility] stating they received patients records but is missing letter Dr.Mansouraty sent on 09/10/2018 if it can be faxed to them  Fax number [phone] to Dr.Kimberly Beavers  Please advise  Thank you
